# Patient Record
Sex: Female | Born: 1960 | Race: Black or African American | Hispanic: No | Smoking: Former smoker
Health system: Southern US, Community
[De-identification: ages and names within clinical notes are randomized; demographics above are authoritative.]

## PROBLEM LIST (undated history)

## (undated) DIAGNOSIS — J45909 Unspecified asthma, uncomplicated: Secondary | ICD-10-CM

## (undated) DIAGNOSIS — I10 Essential (primary) hypertension: Secondary | ICD-10-CM

## (undated) DIAGNOSIS — J449 Chronic obstructive pulmonary disease, unspecified: Secondary | ICD-10-CM

## (undated) DIAGNOSIS — R7303 Prediabetes: Secondary | ICD-10-CM

## (undated) DIAGNOSIS — F419 Anxiety disorder, unspecified: Secondary | ICD-10-CM

## (undated) DIAGNOSIS — E119 Type 2 diabetes mellitus without complications: Secondary | ICD-10-CM

## (undated) HISTORY — PX: CHOLECYSTECTOMY: SHX55

## (undated) HISTORY — PX: COLON SURGERY: SHX602

## (undated) HISTORY — PX: OTHER SURGICAL HISTORY: SHX169

---

## 2004-06-13 ENCOUNTER — Emergency Department: Payer: Self-pay | Admitting: Emergency Medicine

## 2004-08-24 ENCOUNTER — Emergency Department: Payer: Self-pay | Admitting: Emergency Medicine

## 2004-10-05 ENCOUNTER — Emergency Department: Payer: Self-pay | Admitting: Emergency Medicine

## 2004-11-02 ENCOUNTER — Emergency Department: Payer: Self-pay | Admitting: Internal Medicine

## 2005-07-03 ENCOUNTER — Other Ambulatory Visit: Payer: Self-pay

## 2005-07-03 ENCOUNTER — Emergency Department: Payer: Self-pay | Admitting: Emergency Medicine

## 2005-07-03 IMAGING — CR DG CHEST 2V
1 series · 2 of 2 positions shown · non-contrast
Comparison: none

REASON FOR EXAM: Chest pain.
COMMENTS:

PROCEDURE:     DXR - DXR CHEST PA (OR AP) AND LATERAL  - [DATE] [DATE]
RESULT:        The lungs are clear.  Cardiovascular structures are
unremarkable.

[Series 1906: postero_anterior · 0.11mm/px · 2 of 2 slices shown]
[im 1/2]
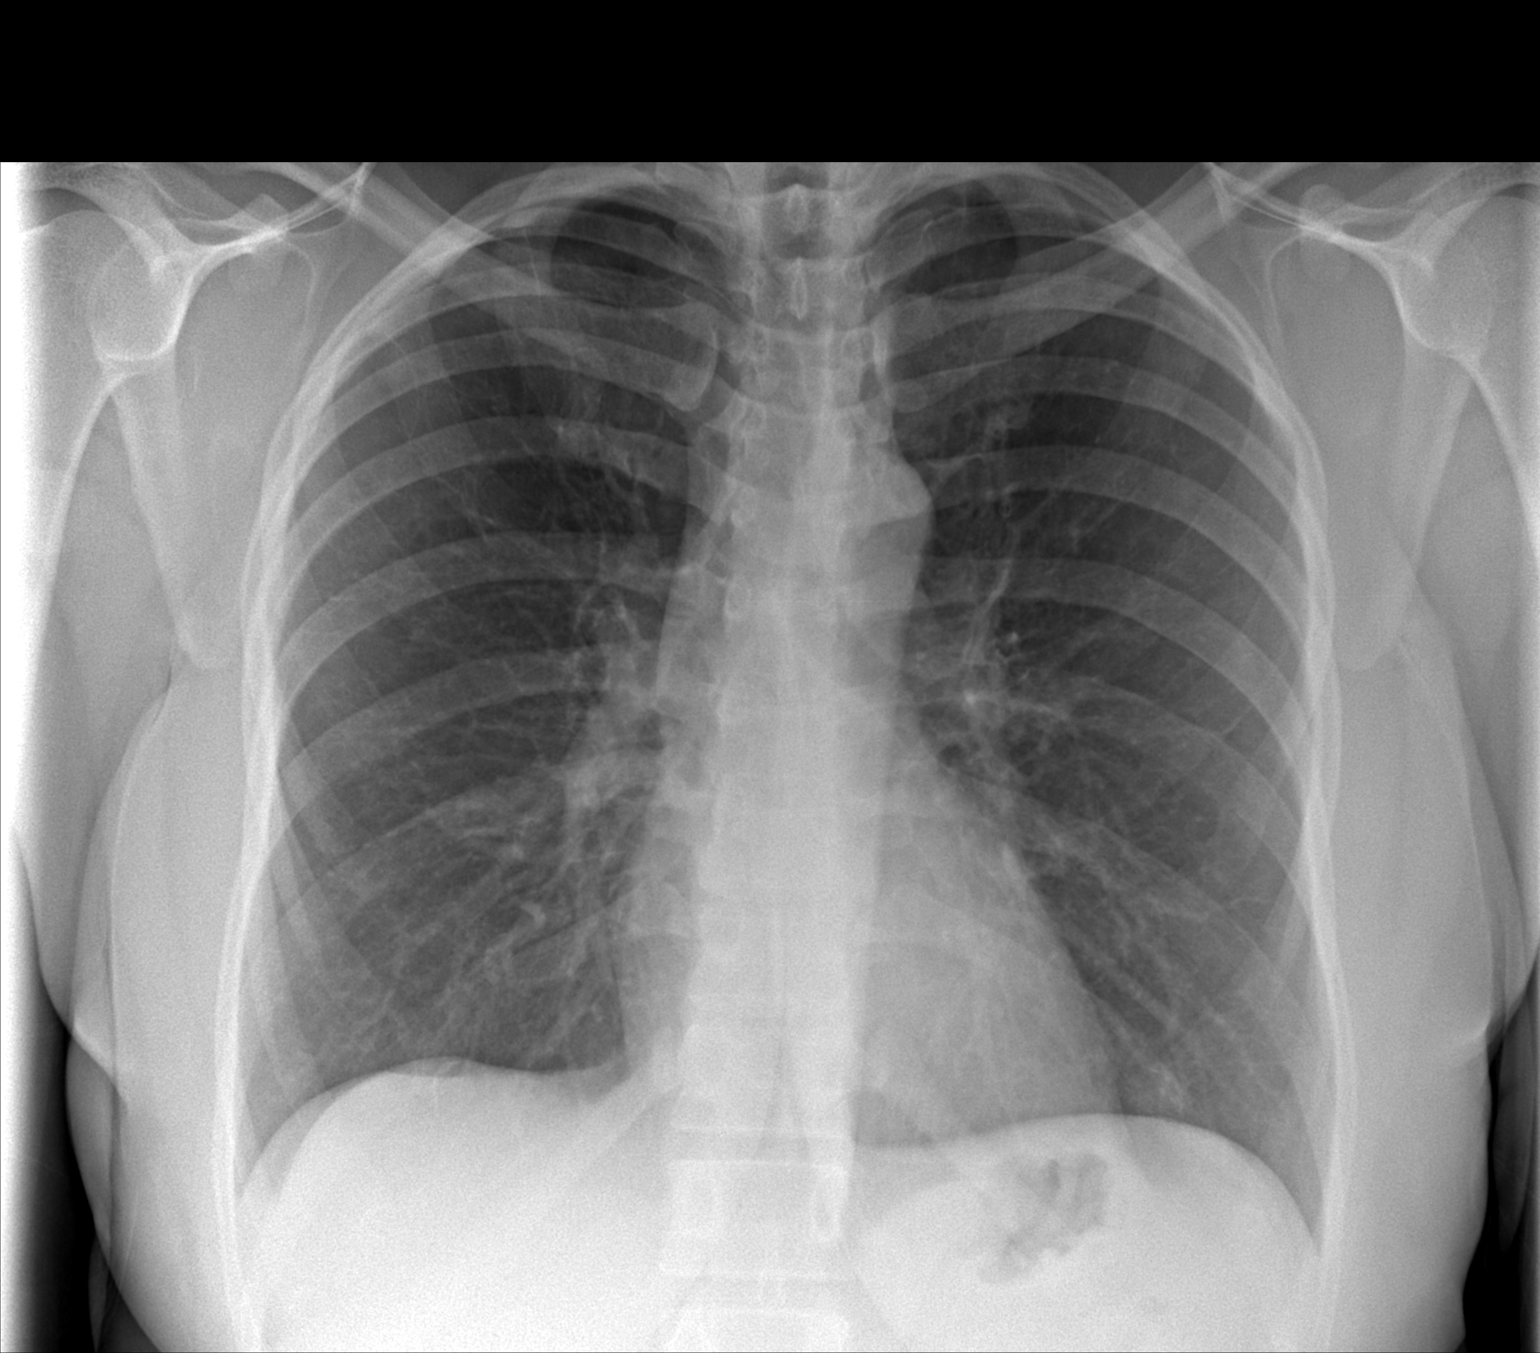
[im 2/2]
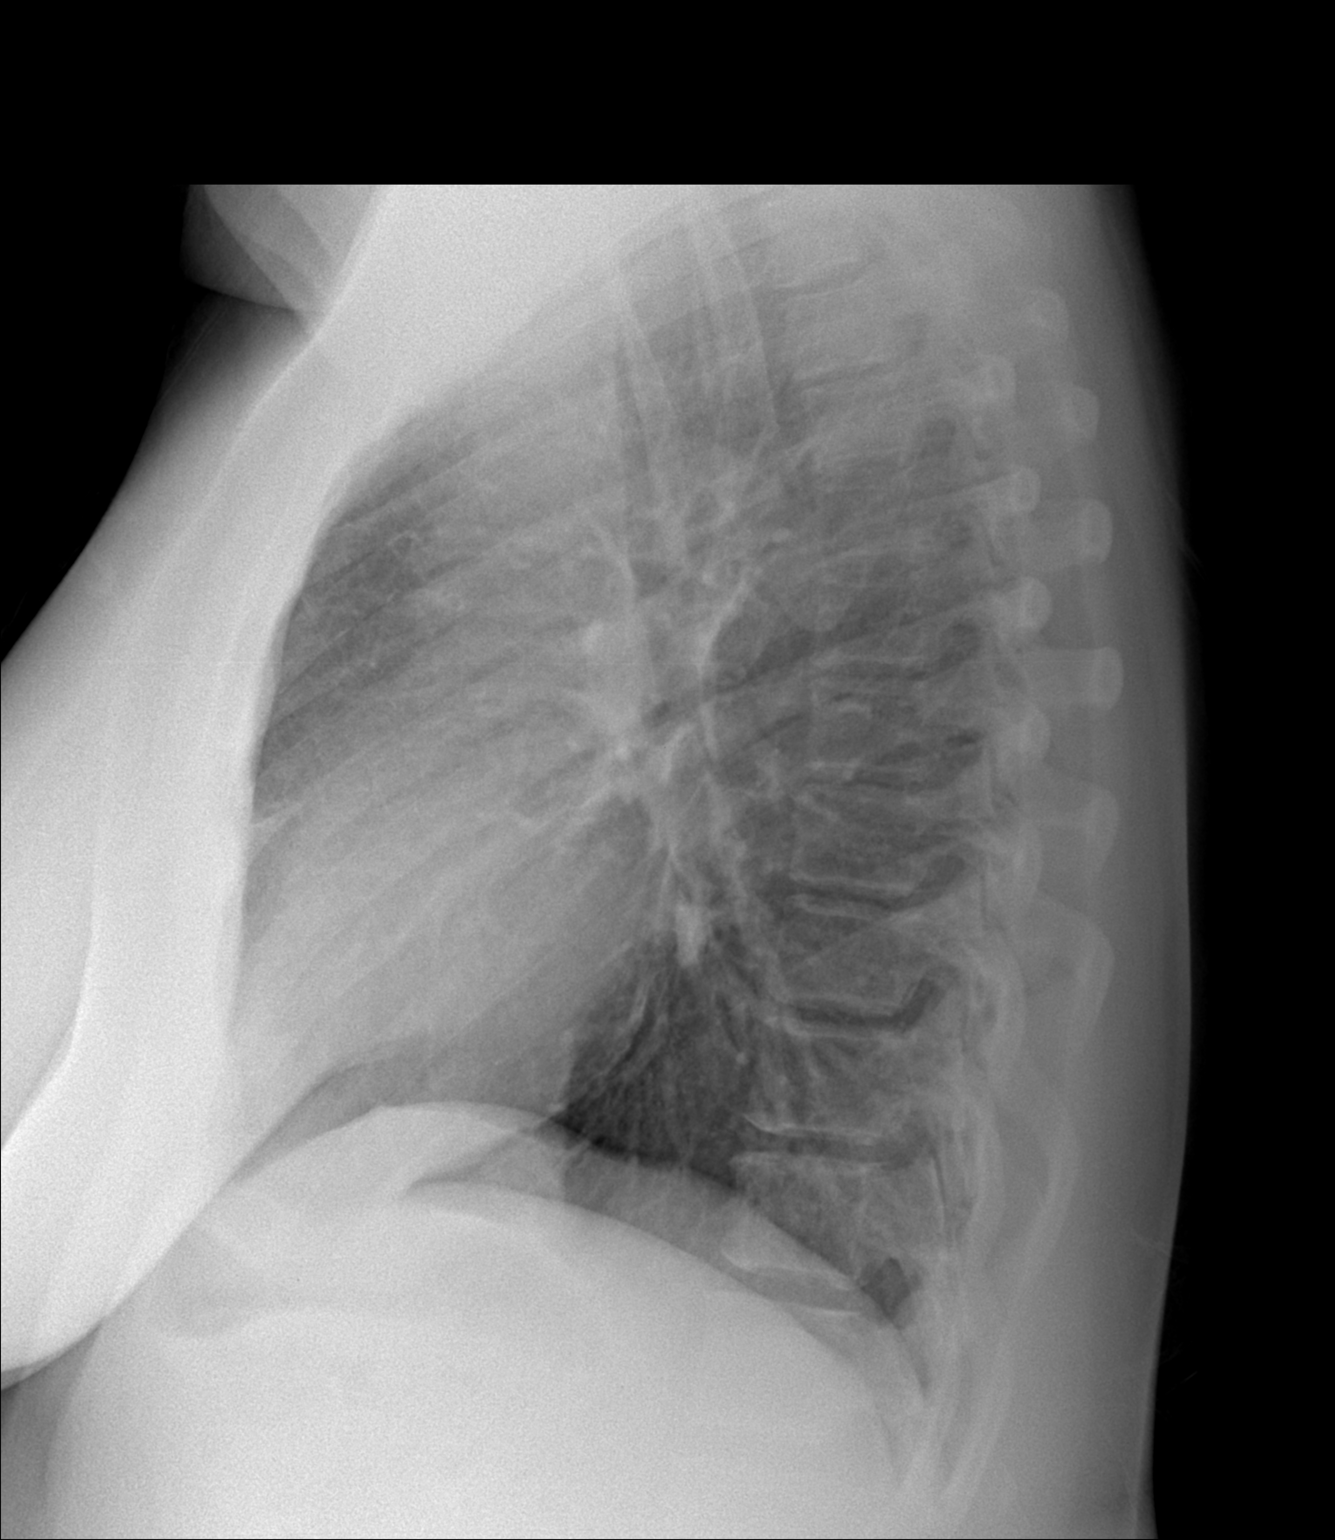

[2 of 2 positions shown; findings below may reference images not displayed]

IMPRESSION: No acute cardiopulmonary disease.

## 2005-07-31 ENCOUNTER — Emergency Department: Payer: Self-pay | Admitting: Emergency Medicine

## 2005-07-31 IMAGING — CR RIGHT ANKLE - COMPLETE 3+ VIEW
1 series · 5 of 5 positions shown · non-contrast
Comparison: none

REASON FOR EXAM: pain
COMMENTS:  LMP: One week ago

PROCEDURE:     DXR - DXR ANKLE RIGHT COMPLETE  - [DATE] [DATE]
RESULT:     No acute bony or joint abnormalities identified. There is no
evidence of fracture or dislocation.

[Series 1: view not recorded · 0.17mm/px · 5 of 5 slices shown]
[im 1/5]
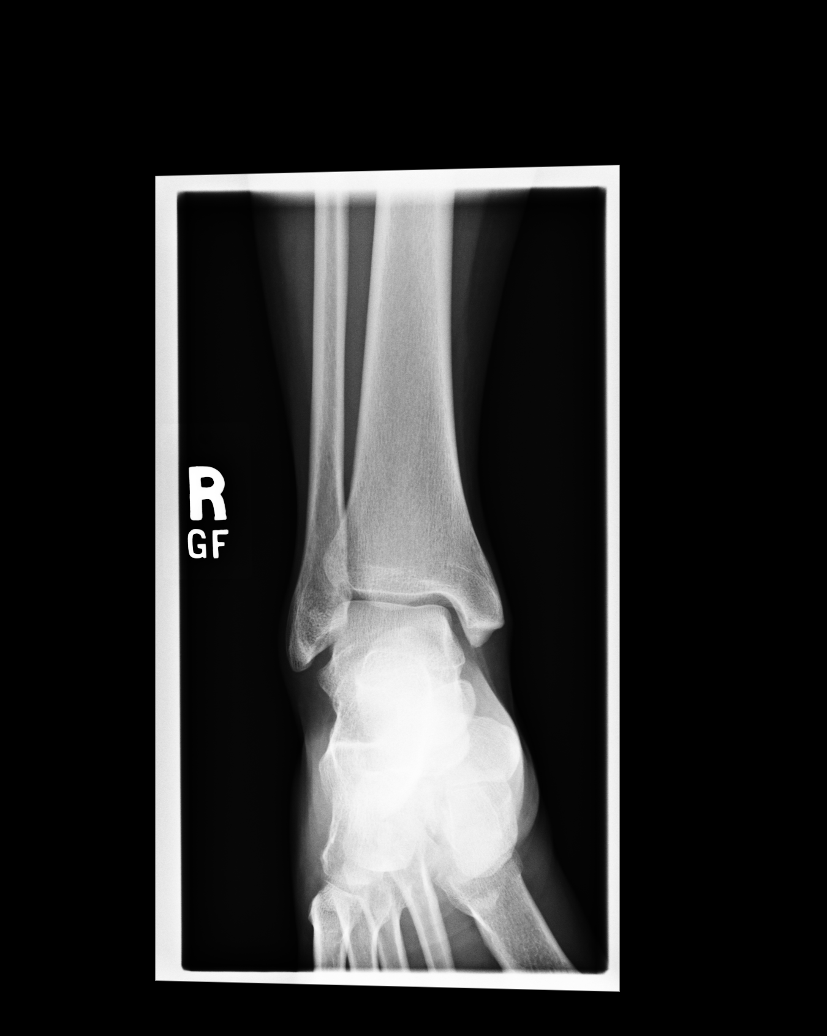
[im 2/5]
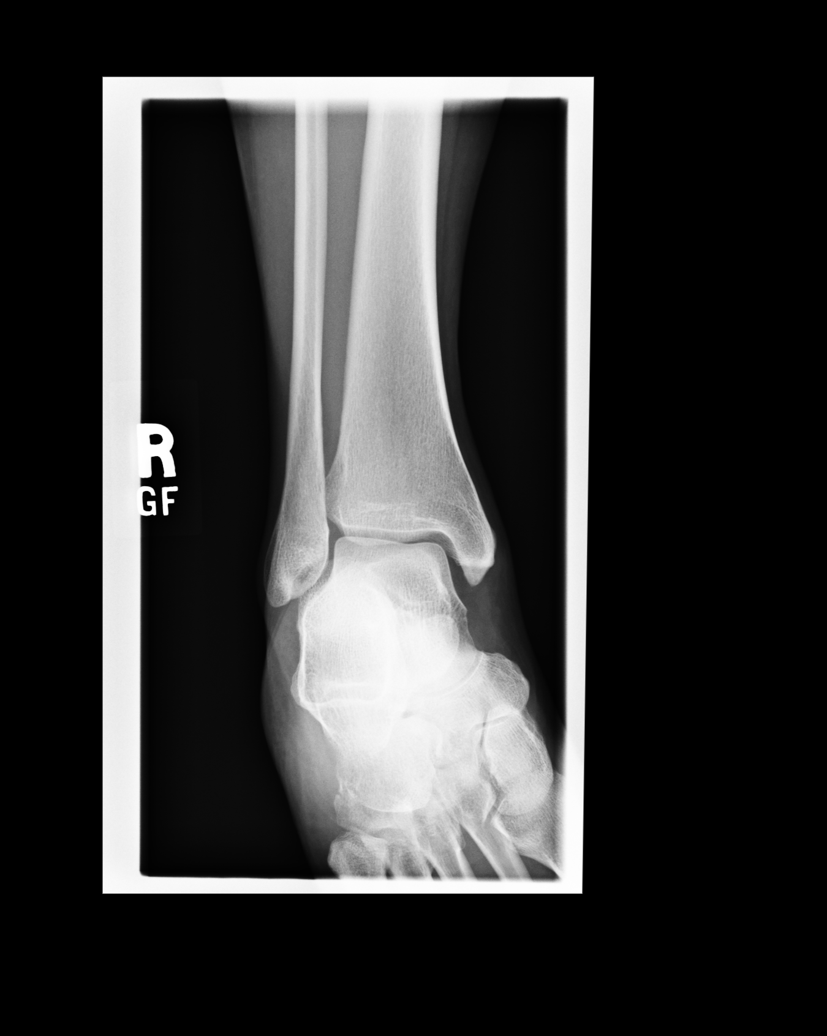
[im 3/5]
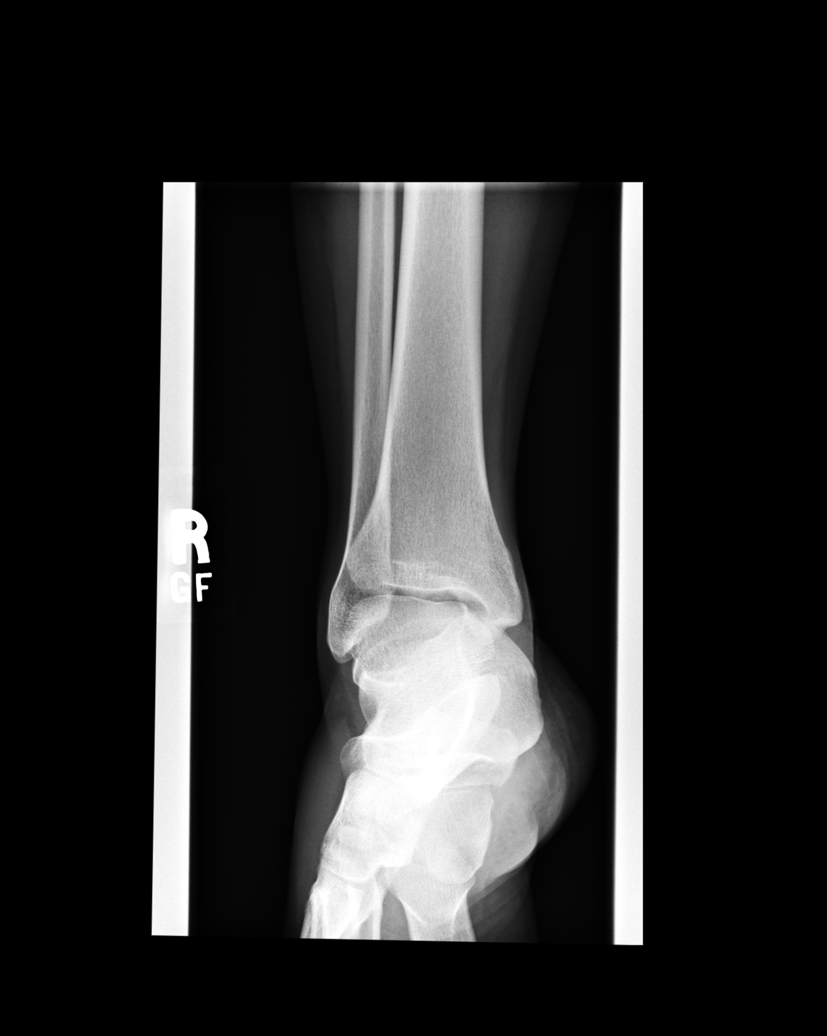
[im 4/5]
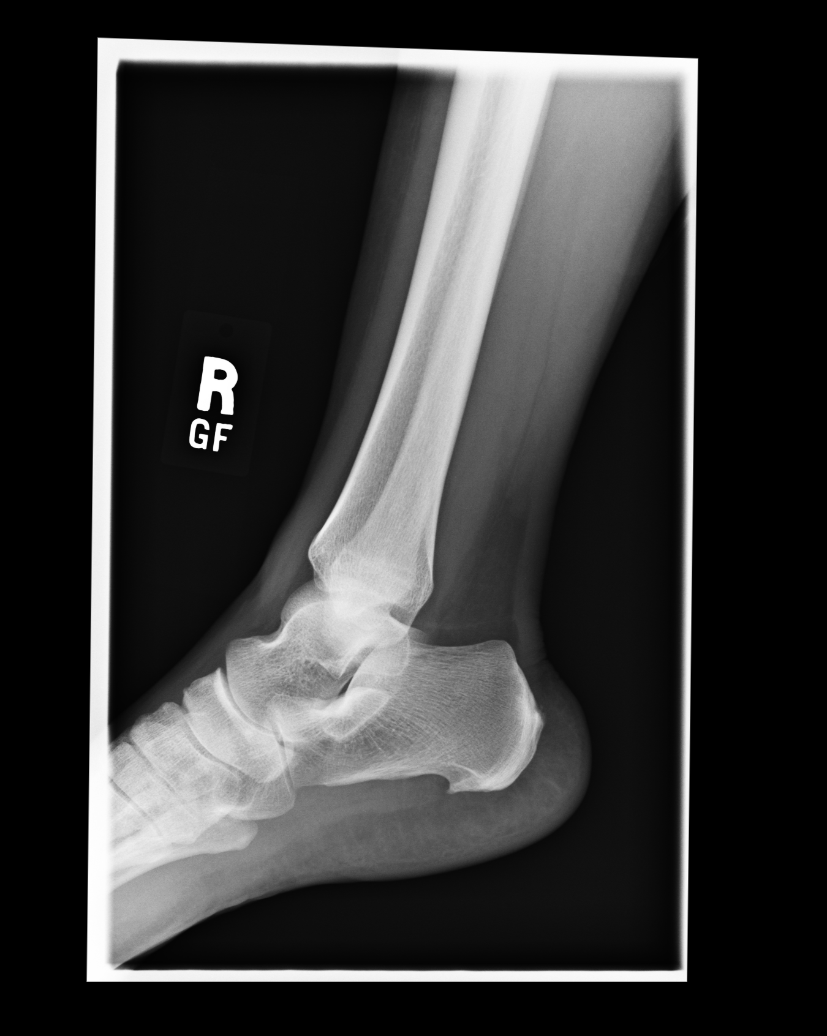
[im 5/5]
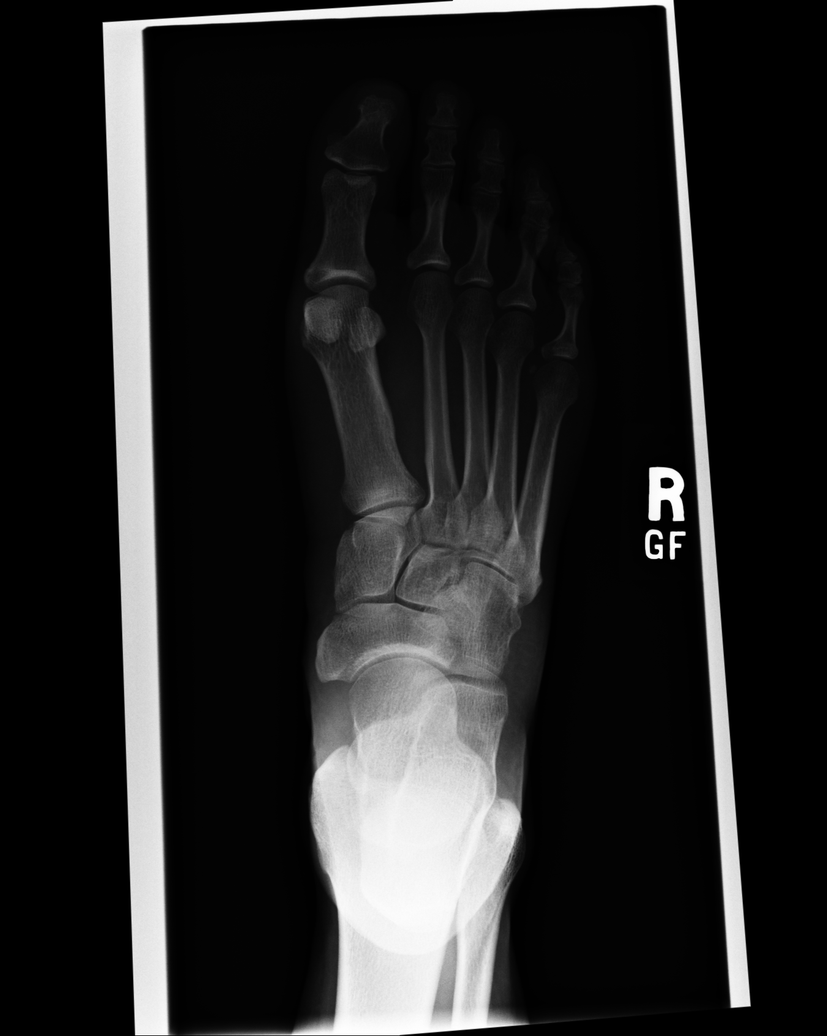

[5 of 5 positions shown; findings below may reference images not displayed]

IMPRESSION: 1)Please see above.

## 2006-02-06 ENCOUNTER — Ambulatory Visit: Payer: Self-pay

## 2006-06-13 ENCOUNTER — Ambulatory Visit: Payer: Self-pay | Admitting: Specialist

## 2006-11-16 ENCOUNTER — Emergency Department: Payer: Self-pay

## 2006-12-26 ENCOUNTER — Emergency Department: Payer: Self-pay | Admitting: Emergency Medicine

## 2009-11-02 ENCOUNTER — Ambulatory Visit: Payer: Self-pay

## 2009-11-24 ENCOUNTER — Ambulatory Visit: Payer: Self-pay | Admitting: Surgery

## 2009-12-01 ENCOUNTER — Ambulatory Visit: Payer: Self-pay | Admitting: Surgery

## 2010-08-26 ENCOUNTER — Emergency Department: Payer: Self-pay | Admitting: Emergency Medicine

## 2010-08-26 IMAGING — CR LEFT THUMB 2+V
1 series · 3 of 3 positions shown · non-contrast
Comparison: none

REASON FOR EXAM: numbness
COMMENTS:

PROCEDURE:     DXR - DXR THUMB LEFT HAND (1ST DIGIT)  - [DATE] [DATE]
RESULT:     No fracture, dislocation or other acute bony abnormality is
identified. No radiodense soft tissue foreign body is seen.

[Series 1: view not recorded · 0.17mm/px · 3 of 3 slices shown]
[im 1/3]
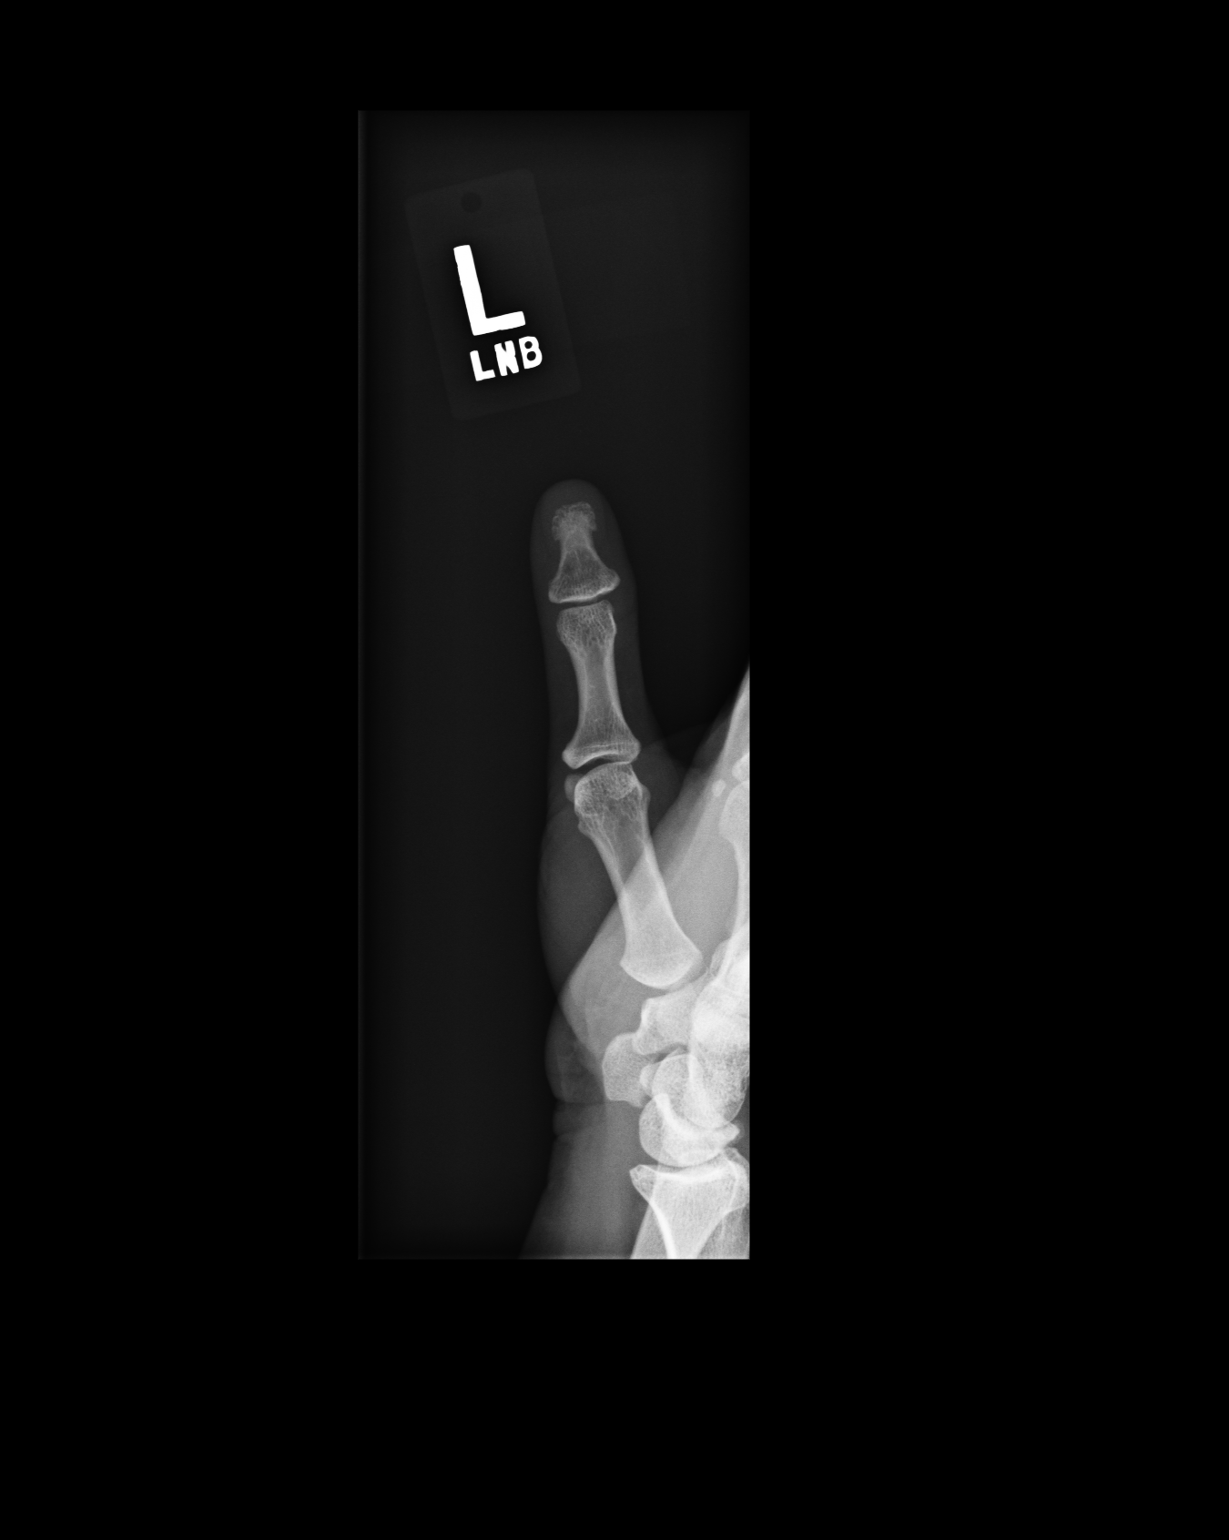
[im 2/3]
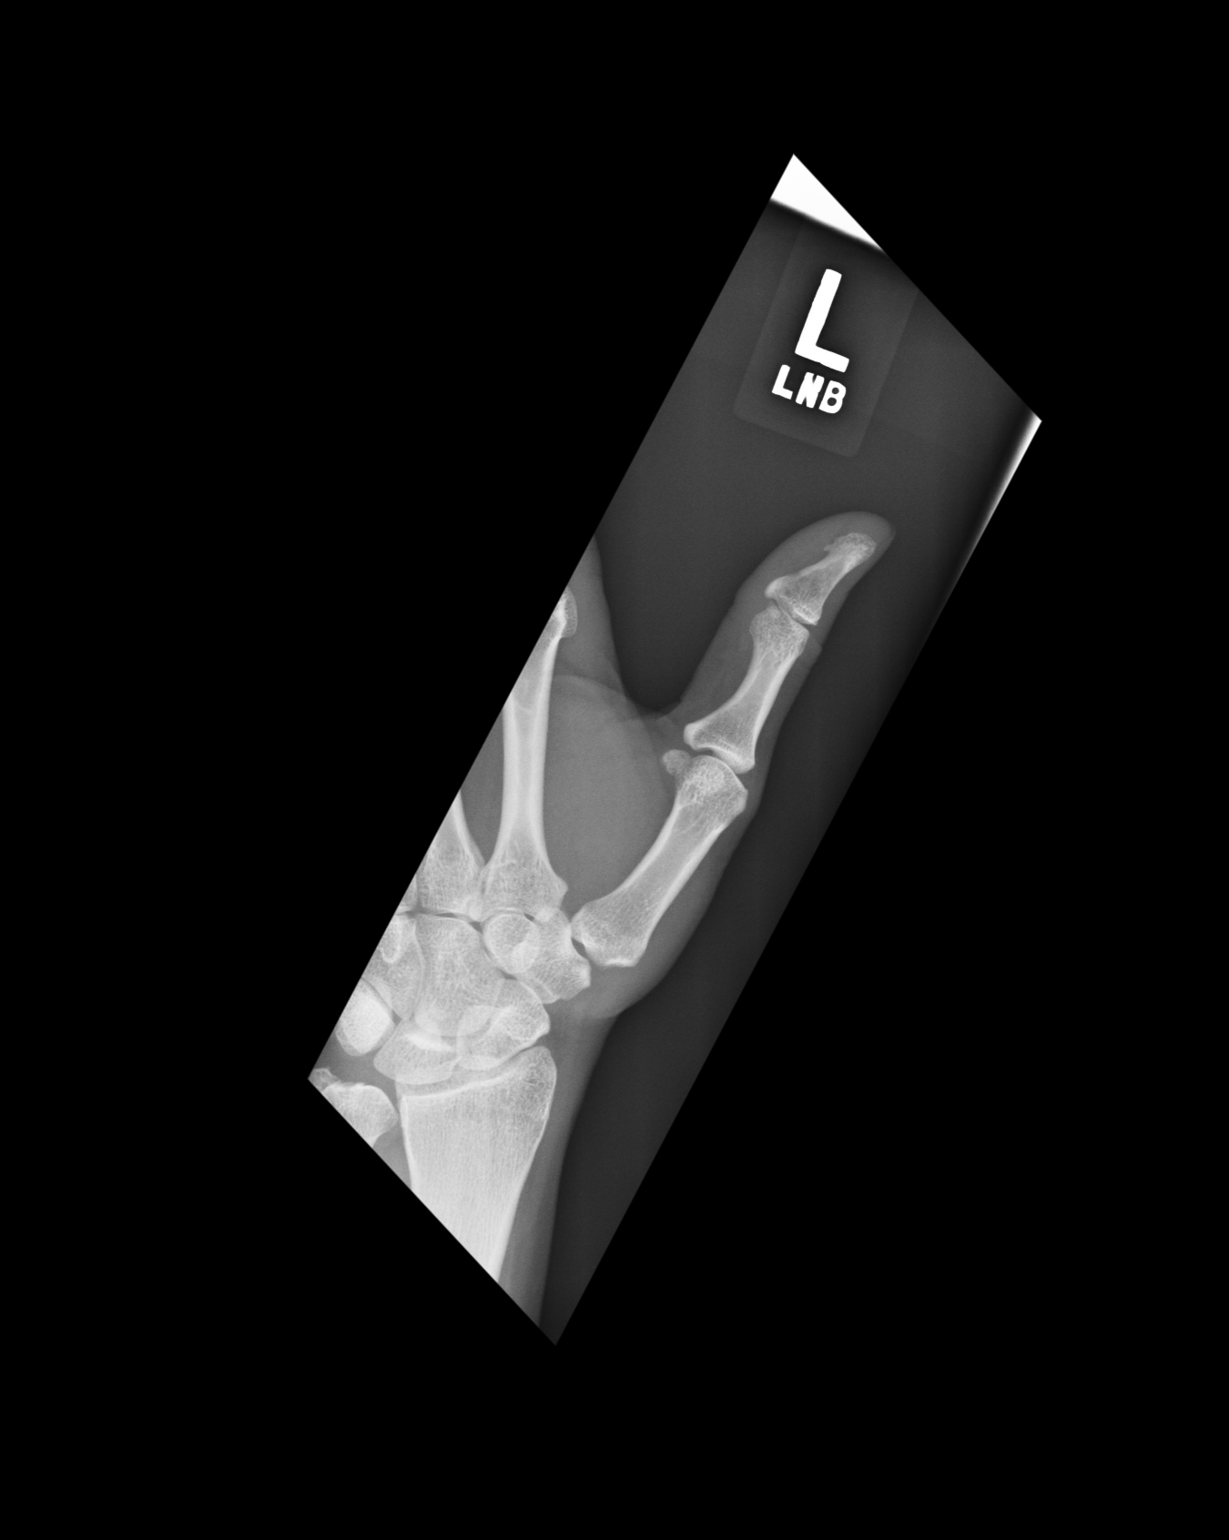
[im 3/3]
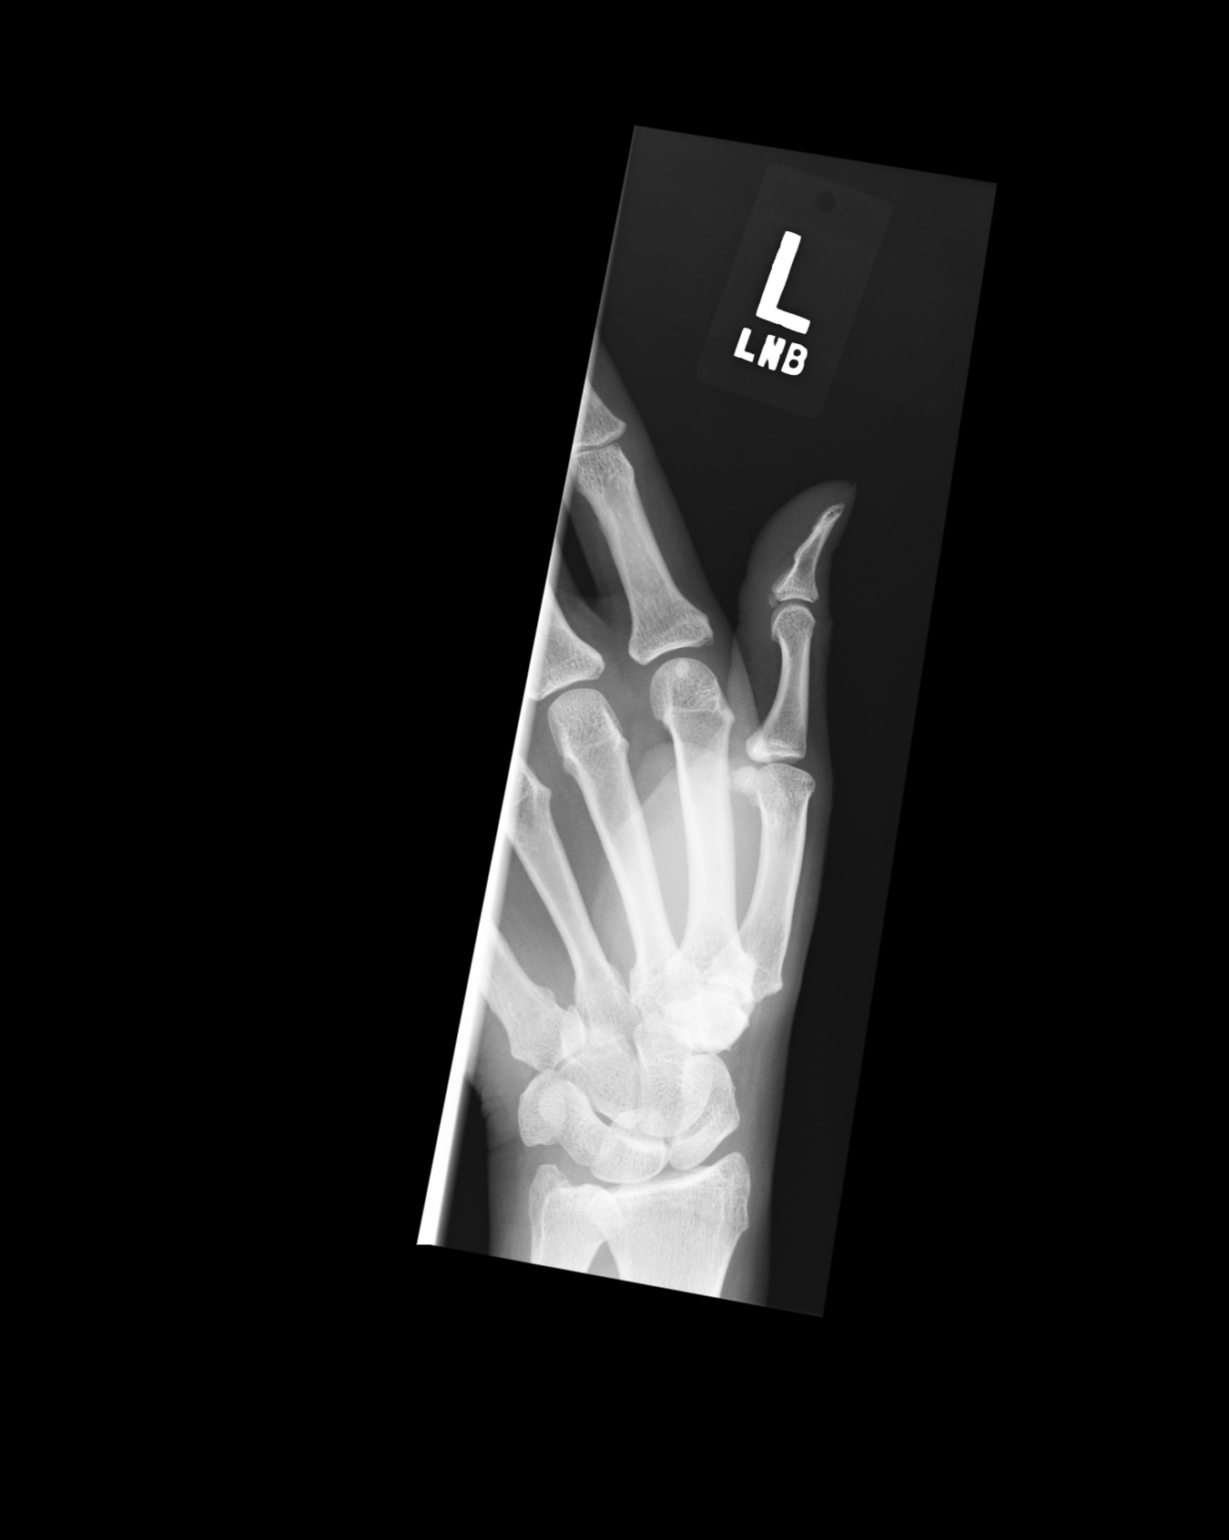

[3 of 3 positions shown; findings below may reference images not displayed]

IMPRESSION: No acute changes are identified.

## 2010-09-08 ENCOUNTER — Emergency Department: Payer: Self-pay | Admitting: Emergency Medicine

## 2010-09-08 IMAGING — CR DG KNEE COMPLETE 4+V*R*
1 series · 4 of 4 positions shown · non-contrast
Comparison: none

REASON FOR EXAM: knee pain
COMMENTS:

PROCEDURE:     DXR - DXR KNEE RT COMP WITH OBLIQUES  - [DATE] [DATE]
RESULT:     No fracture, dislocation or other acute bony abnormality is
identified. The knee joint space is well maintained. The patella is intact.
No lytic or blastic lesions are seen.

[Series 1: view not recorded · 0.17mm/px · 4 of 4 slices shown]
[im 1/4]
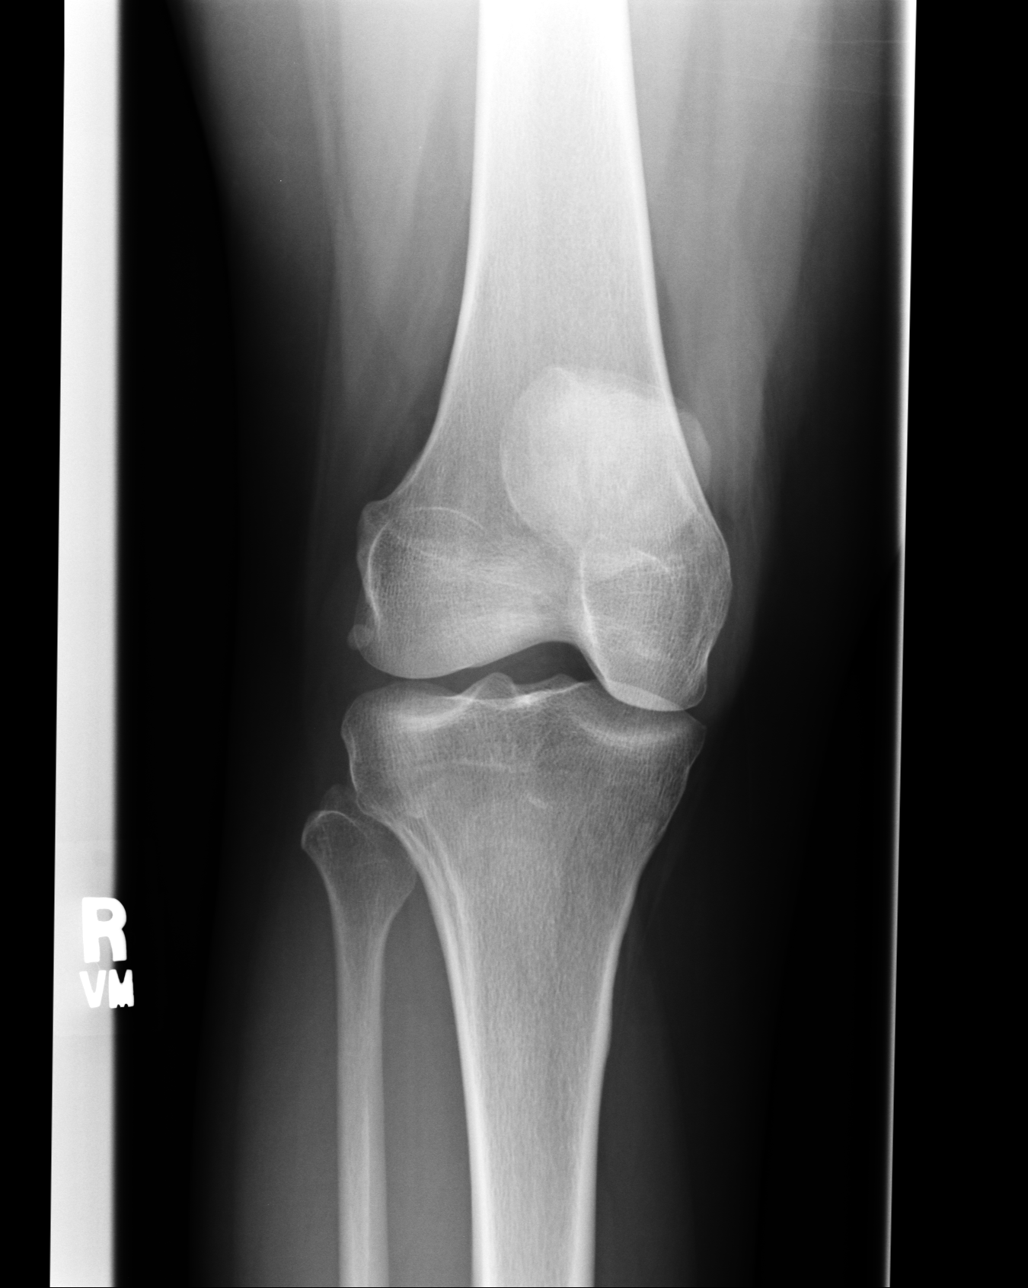
[im 2/4]
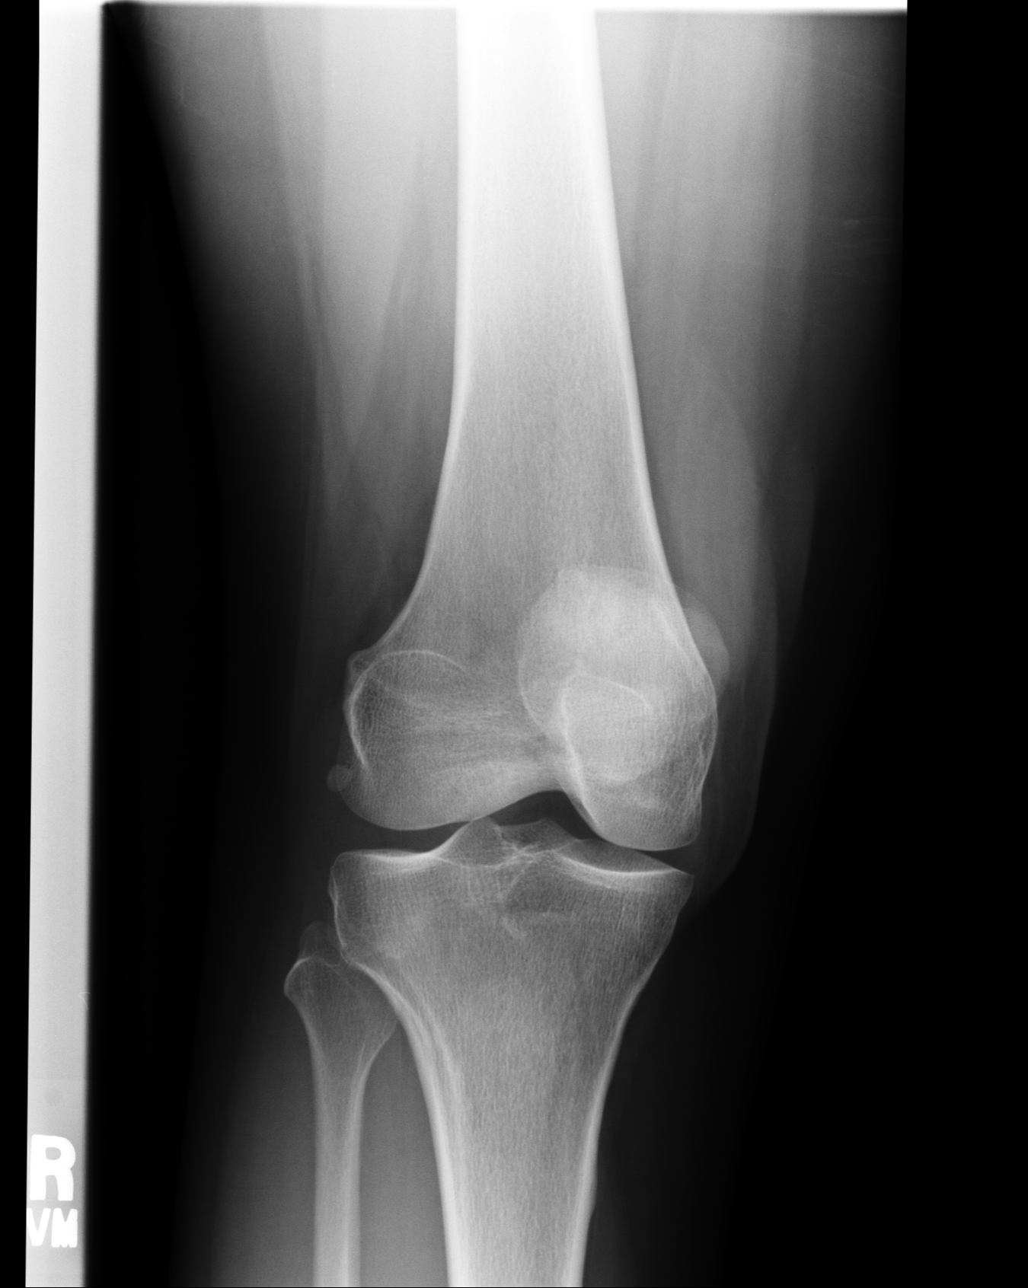
[im 3/4]
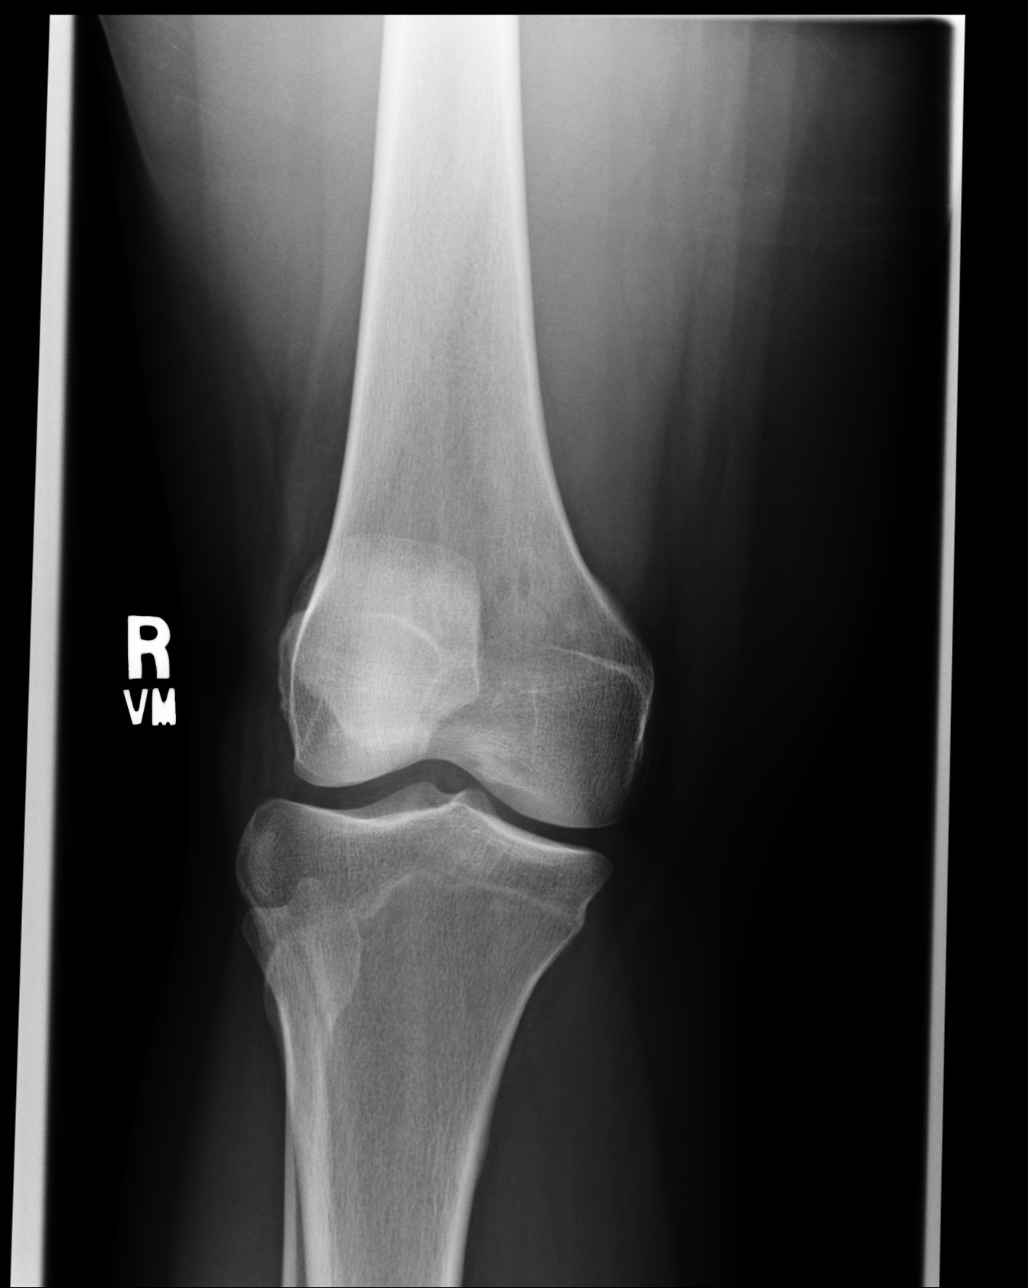
[im 4/4]
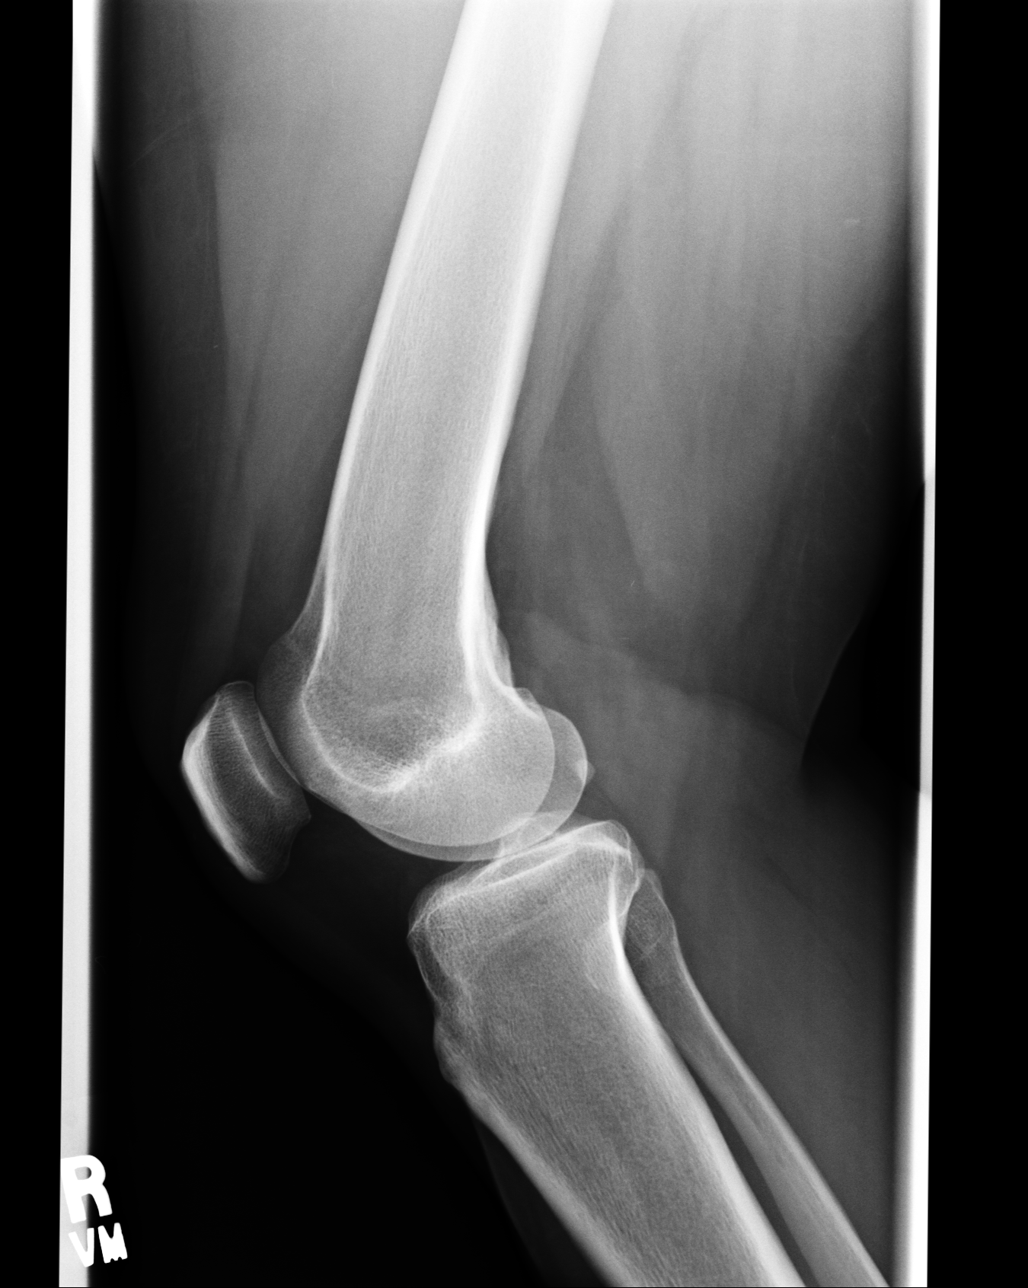

[4 of 4 positions shown; findings below may reference images not displayed]

IMPRESSION: No significant abnormalities are noted.

## 2010-09-08 IMAGING — US US EXTREM LOW VENOUS*R*
1 series · 17 of 24 positions shown · non-contrast
Comparison: none

REASON FOR EXAM: pain posterior knee, family hx dvt
COMMENTS:

[Series 1: us extrem low venous*right* · 17 of 25 slices shown]
[im 1/25]
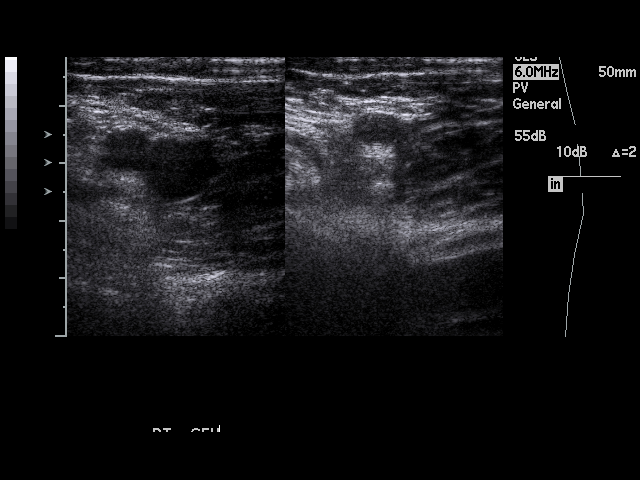
[im 3/25]
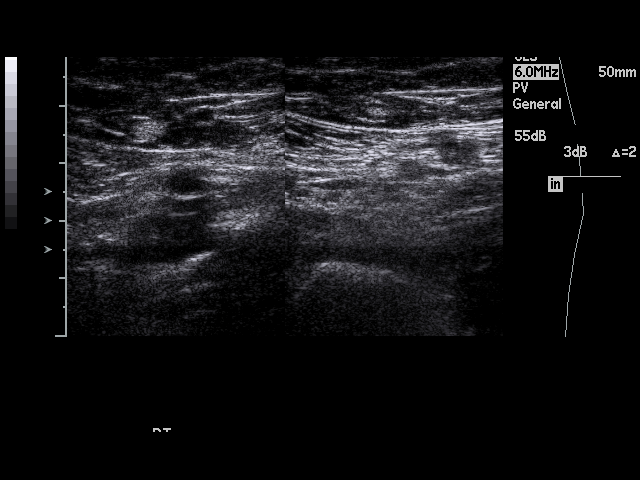
[im 4/25]
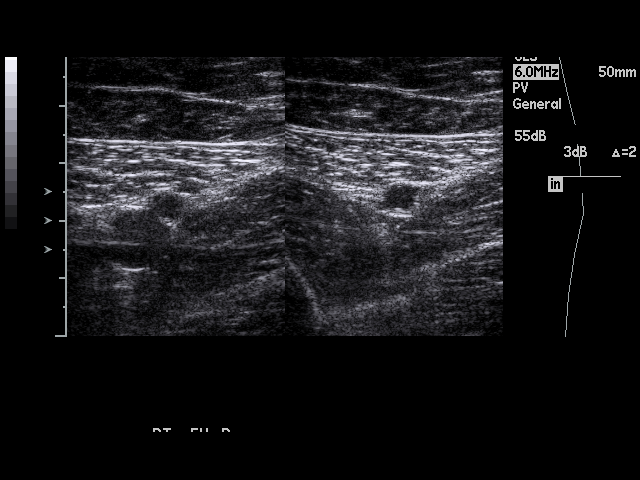
[im 5/25]
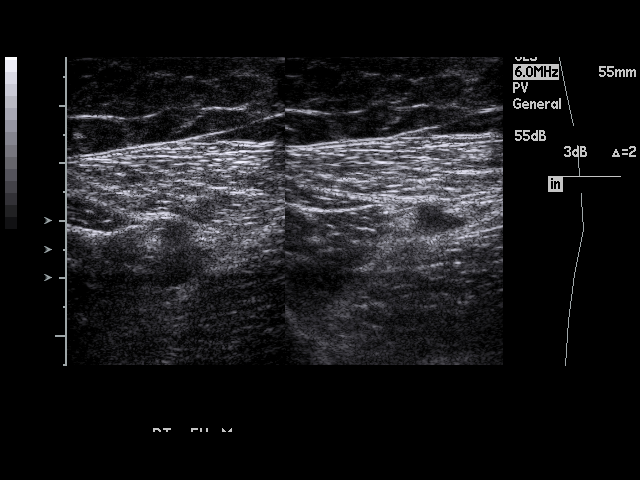
[im 7/25]
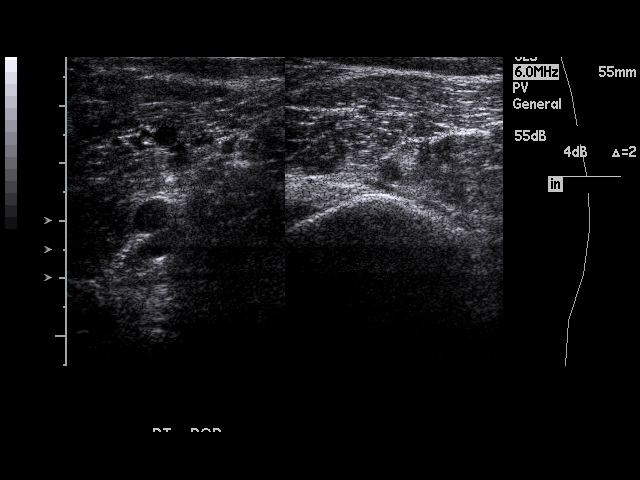
[im 8/25]
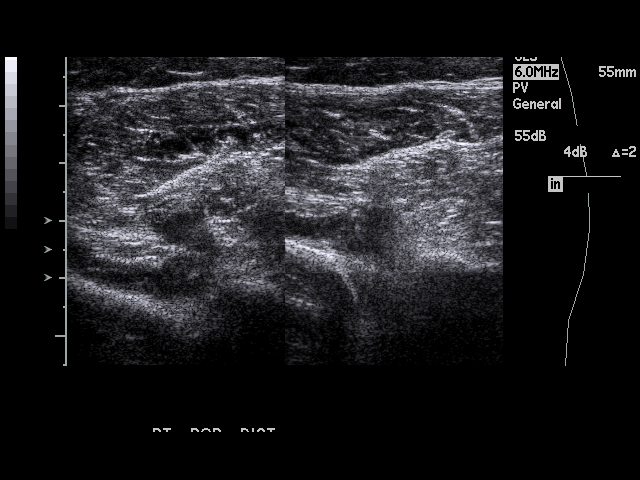
[im 10/25]
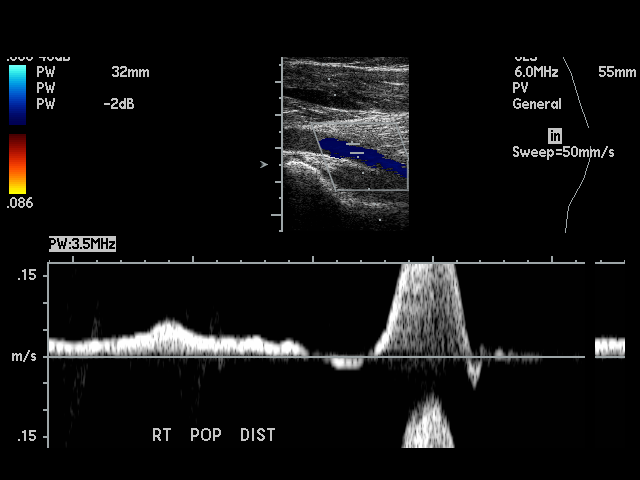
[im 11/25]
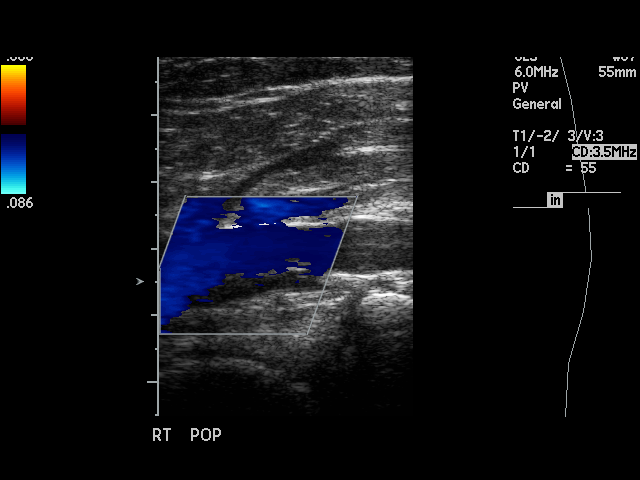
[im 13/25]
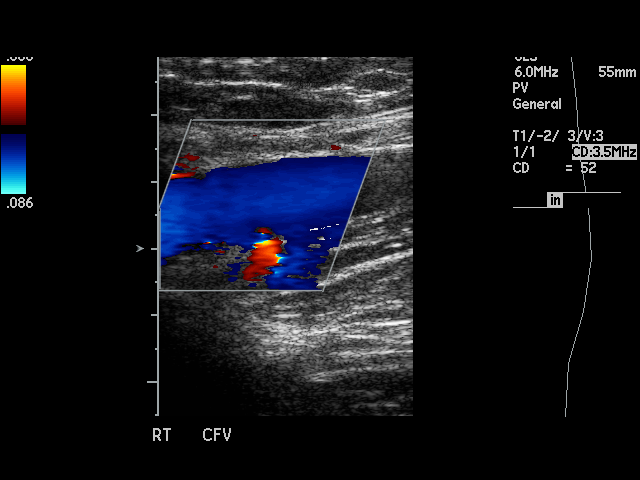
[im 14/25]
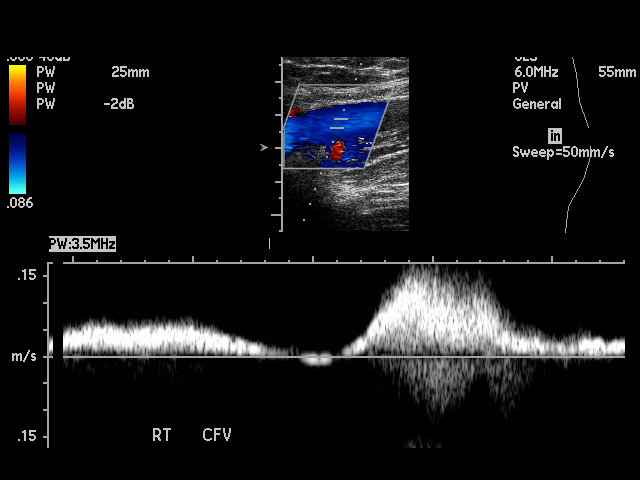
[im 15/25]
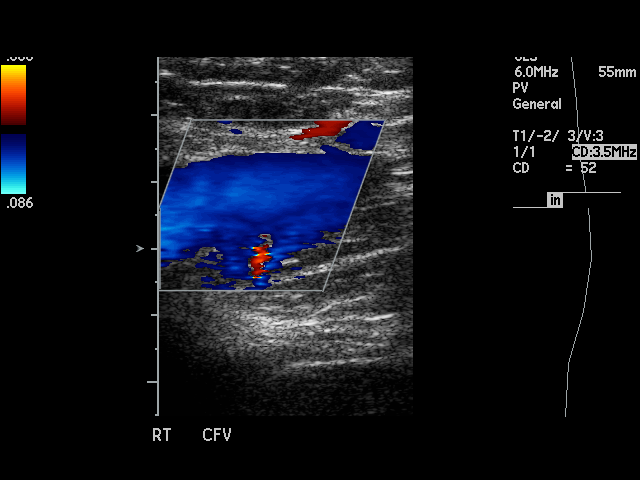
[im 17/25]
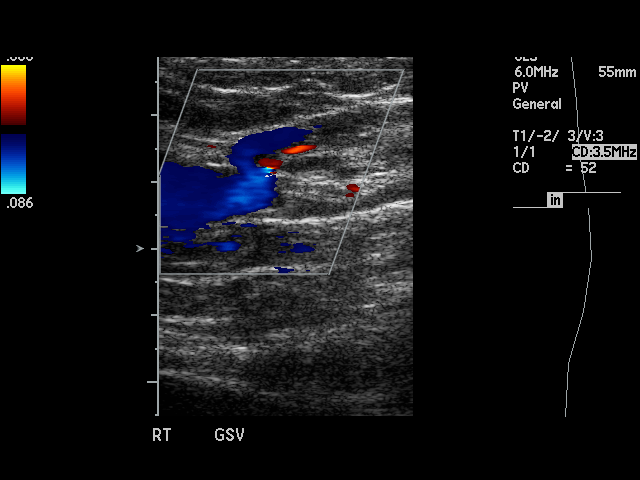
[im 18/25]
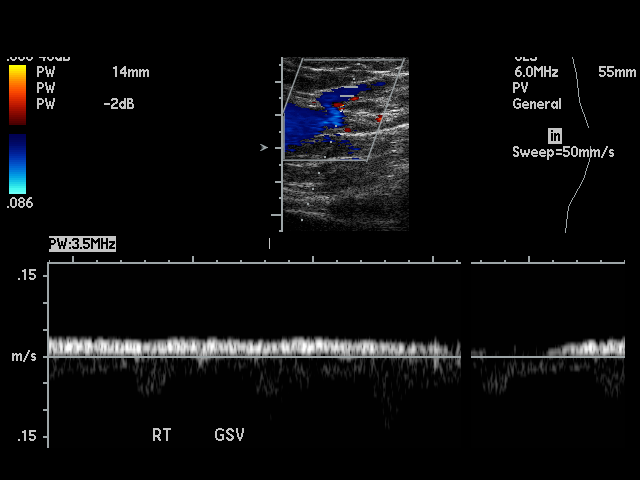
[im 20/25]
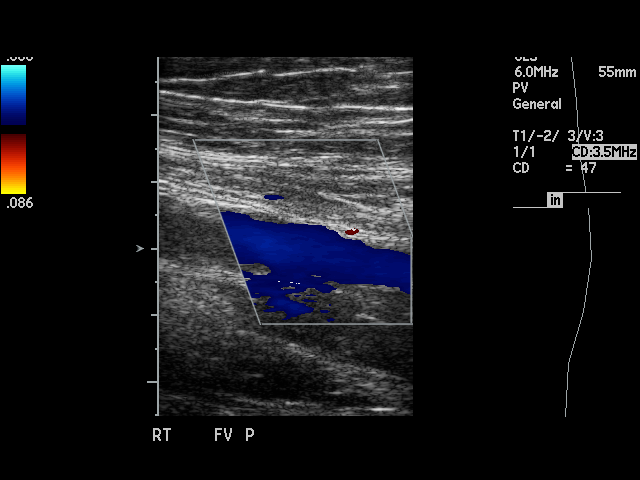
[im 21/25]
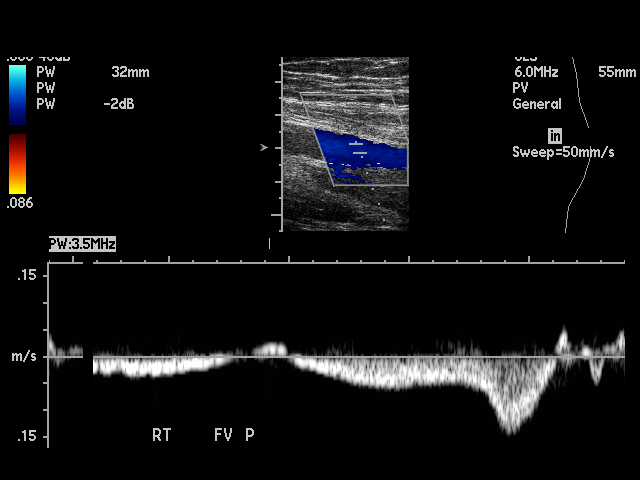
[im 22/25]
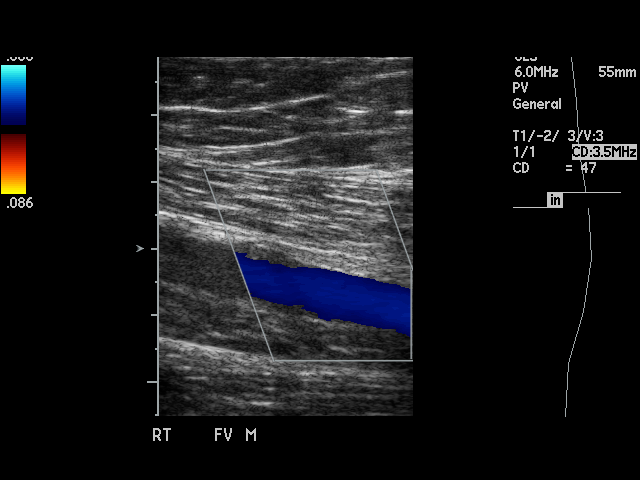
[im 25/25]
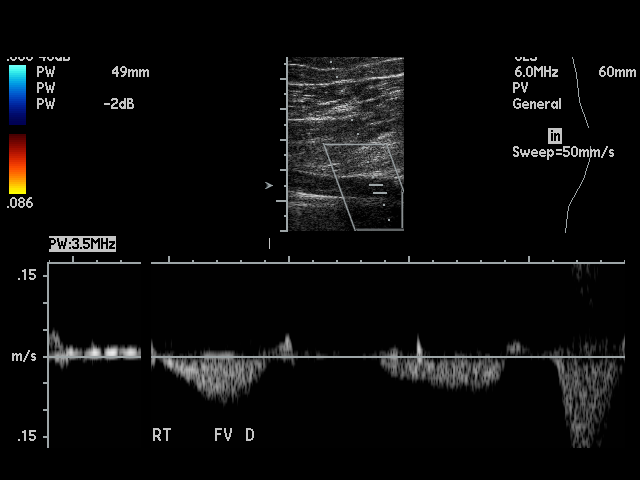

[17 of 24 positions shown; findings below may reference images not displayed]

PROCEDURE:     US  - US DOPPLER LOW EXTR RIGHT  - [DATE]  [DATE]

RESULT:     The phasic, augmentation and Valsalva flow waveforms are normal
in appearance. The right femoral and popliteal vein shows complete
compressibility throughout its course. Doppler examination shows no
occlusion or evidence for deep vein thrombosis.
IMPRESSION: No deep vein thrombosis is identified in the right leg.

## 2012-04-21 ENCOUNTER — Emergency Department: Payer: Self-pay | Admitting: Emergency Medicine

## 2012-04-21 IMAGING — CR DG KNEE COMPLETE 4+V*L*
1 series · 4 of 4 positions shown · non-contrast
Comparison: none

REASON FOR EXAM: pain, injury
COMMENTS:

PROCEDURE:     DXR - DXR KNEE LT COMP WITH OBLIQUES  - [DATE]  [DATE]
RESULT:     There is no evidence of fracture, dislocation, or malalignment.
Likely a benign bone island within the patella.

[Series 1: ap · 0.17mm/px · 4 of 4 slices shown]
[im 1/4]
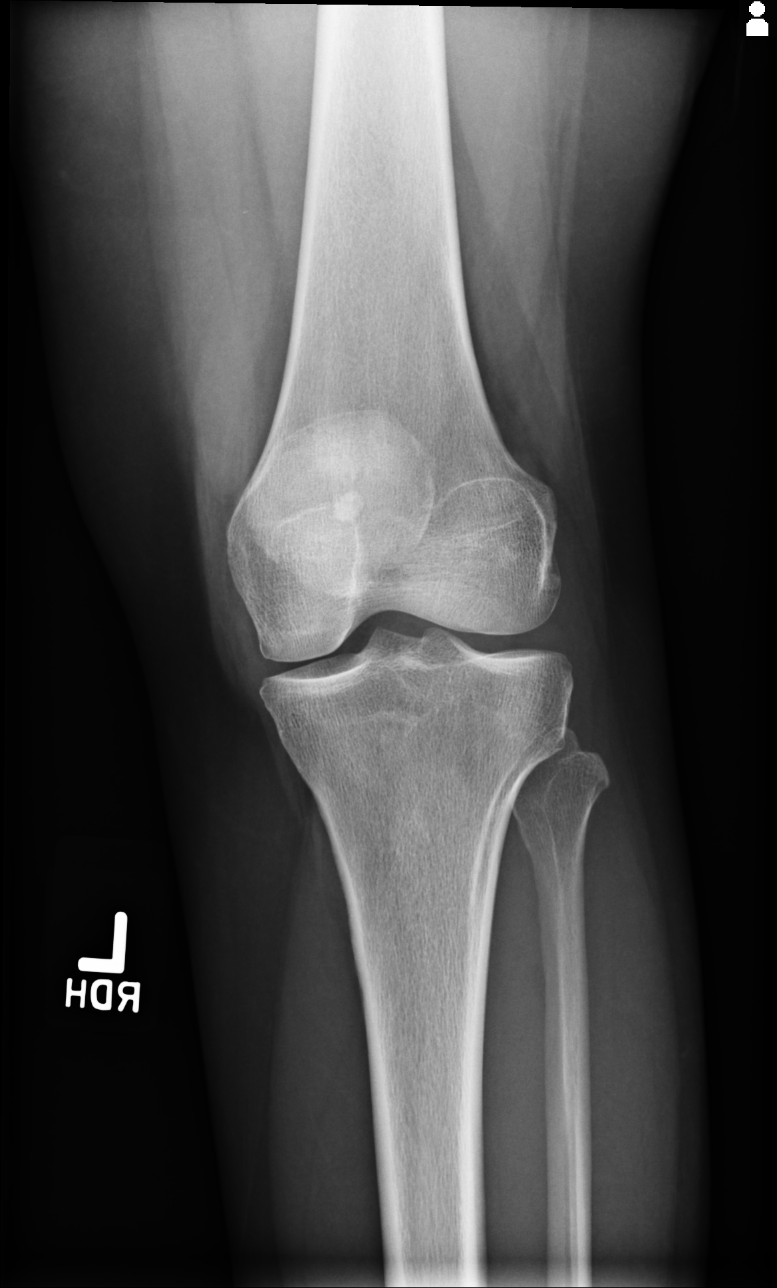
[im 2/4]
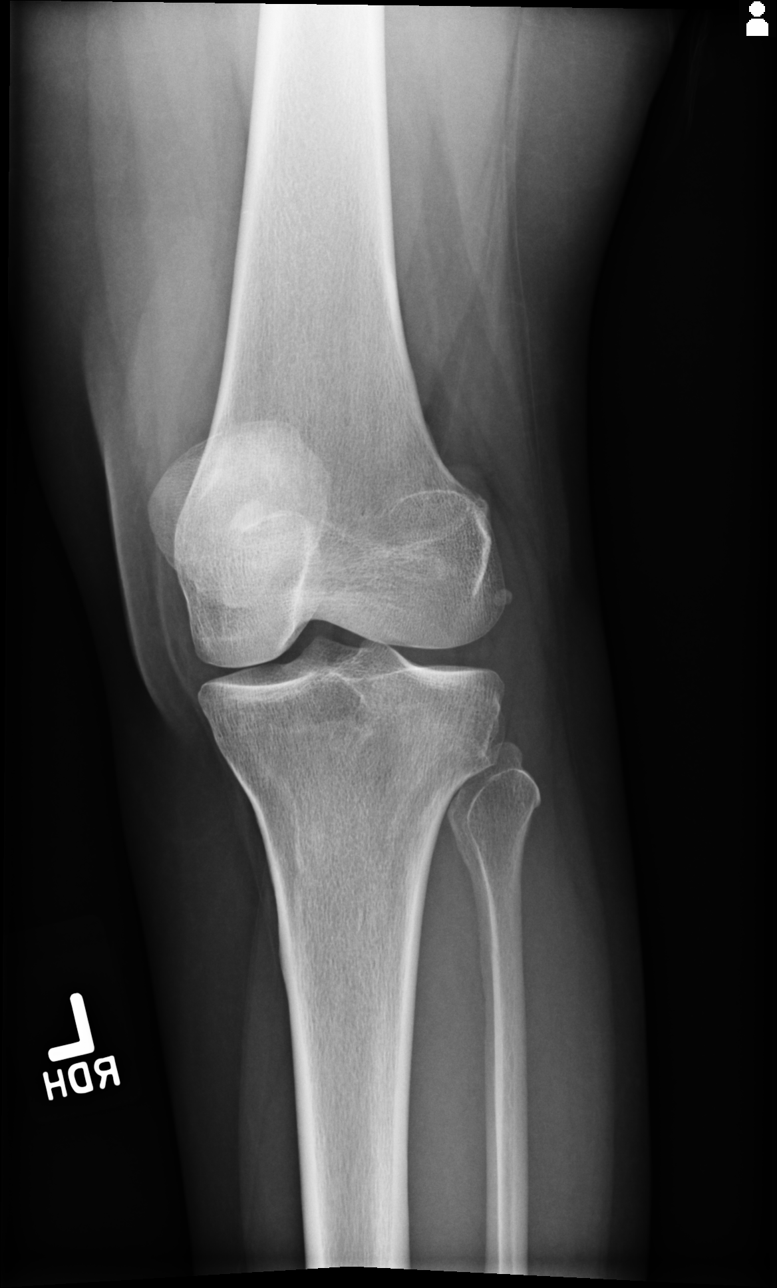
[im 3/4]
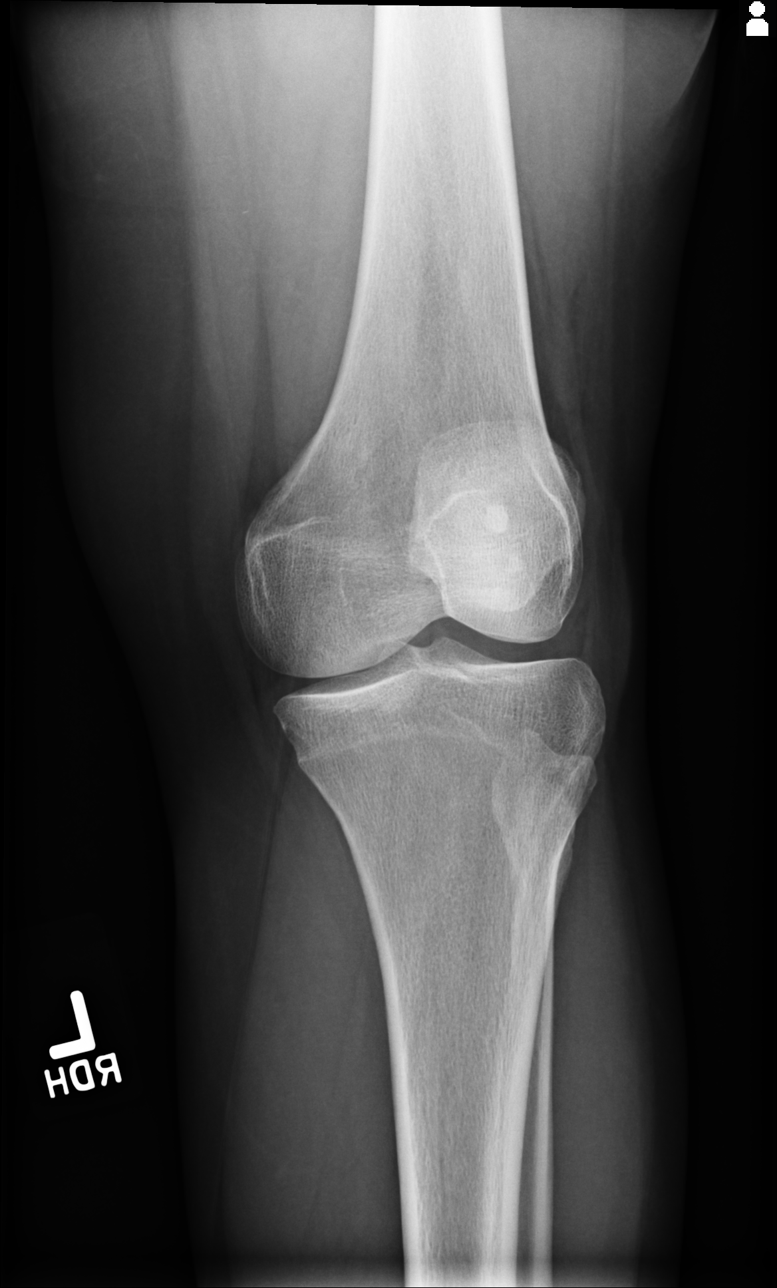
[im 4/4]
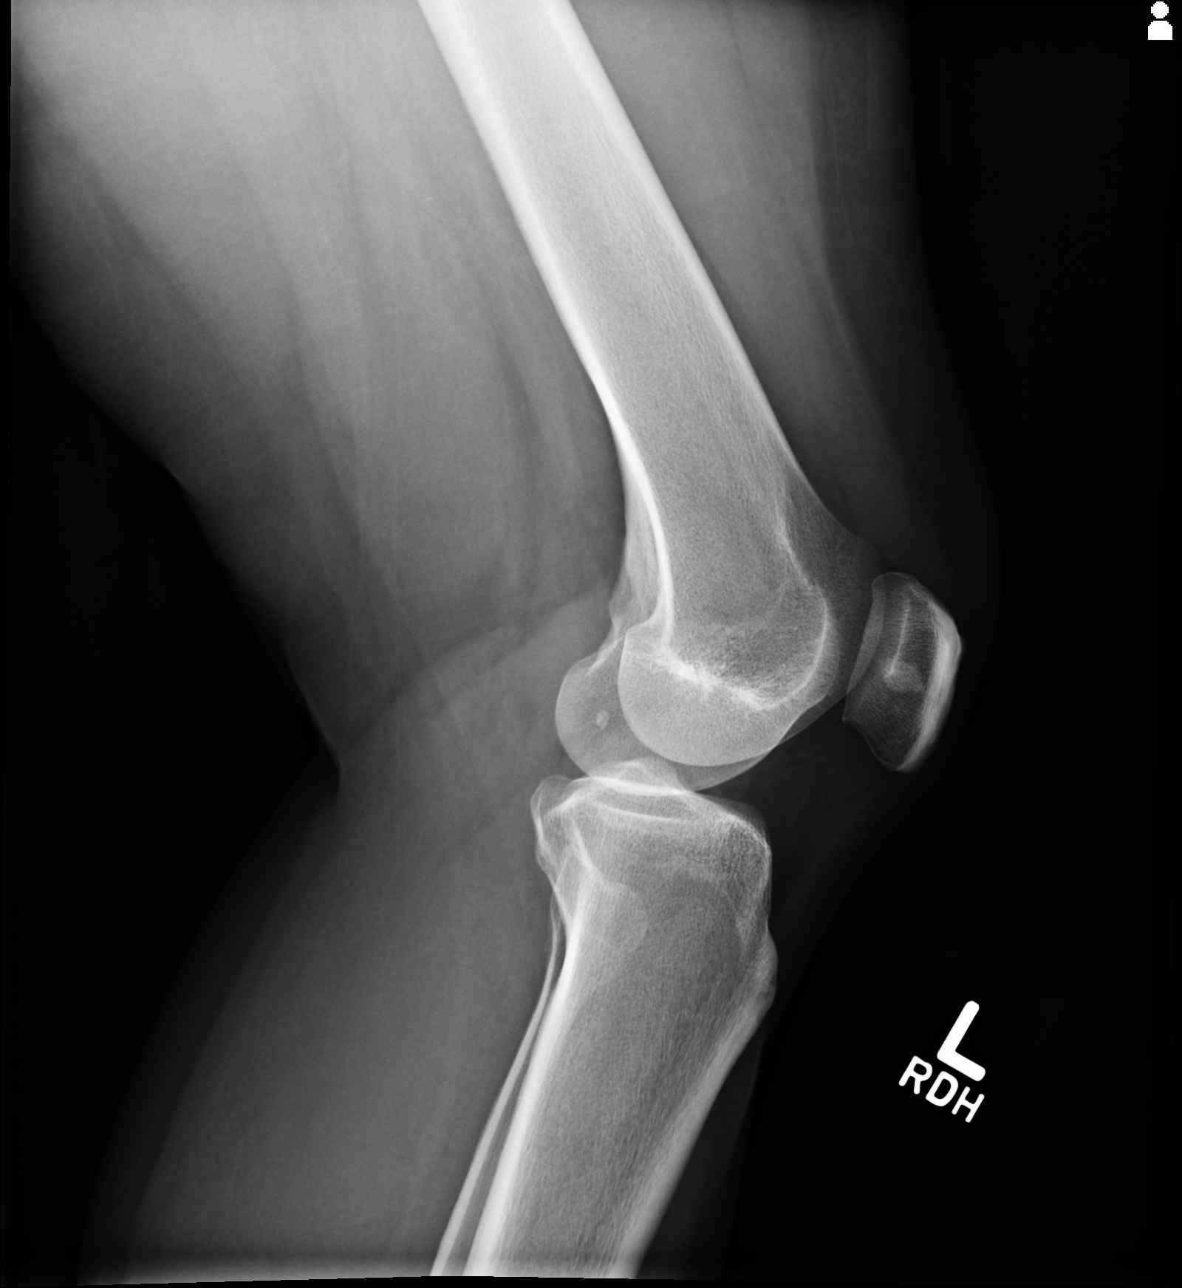

[4 of 4 positions shown; findings below may reference images not displayed]

IMPRESSION: 1. No evidence of acute abnormalities.
2. If there are persistent complaints of pain or persistent clinical
concern, a repeat evaluation in 7-10 days is recommended if clinically
warranted.

## 2012-09-17 ENCOUNTER — Emergency Department: Payer: Self-pay | Admitting: Emergency Medicine

## 2012-09-17 IMAGING — CR DG CHEST 2V
1 series · 2 of 2 positions shown · non-contrast
Comparison: none

REASON FOR EXAM: cough
COMMENTS:   May transport without cardiac monitor

[Series 2: w chest pa · 0.14mm/px · 2 of 2 slices shown]
[im 1/2]
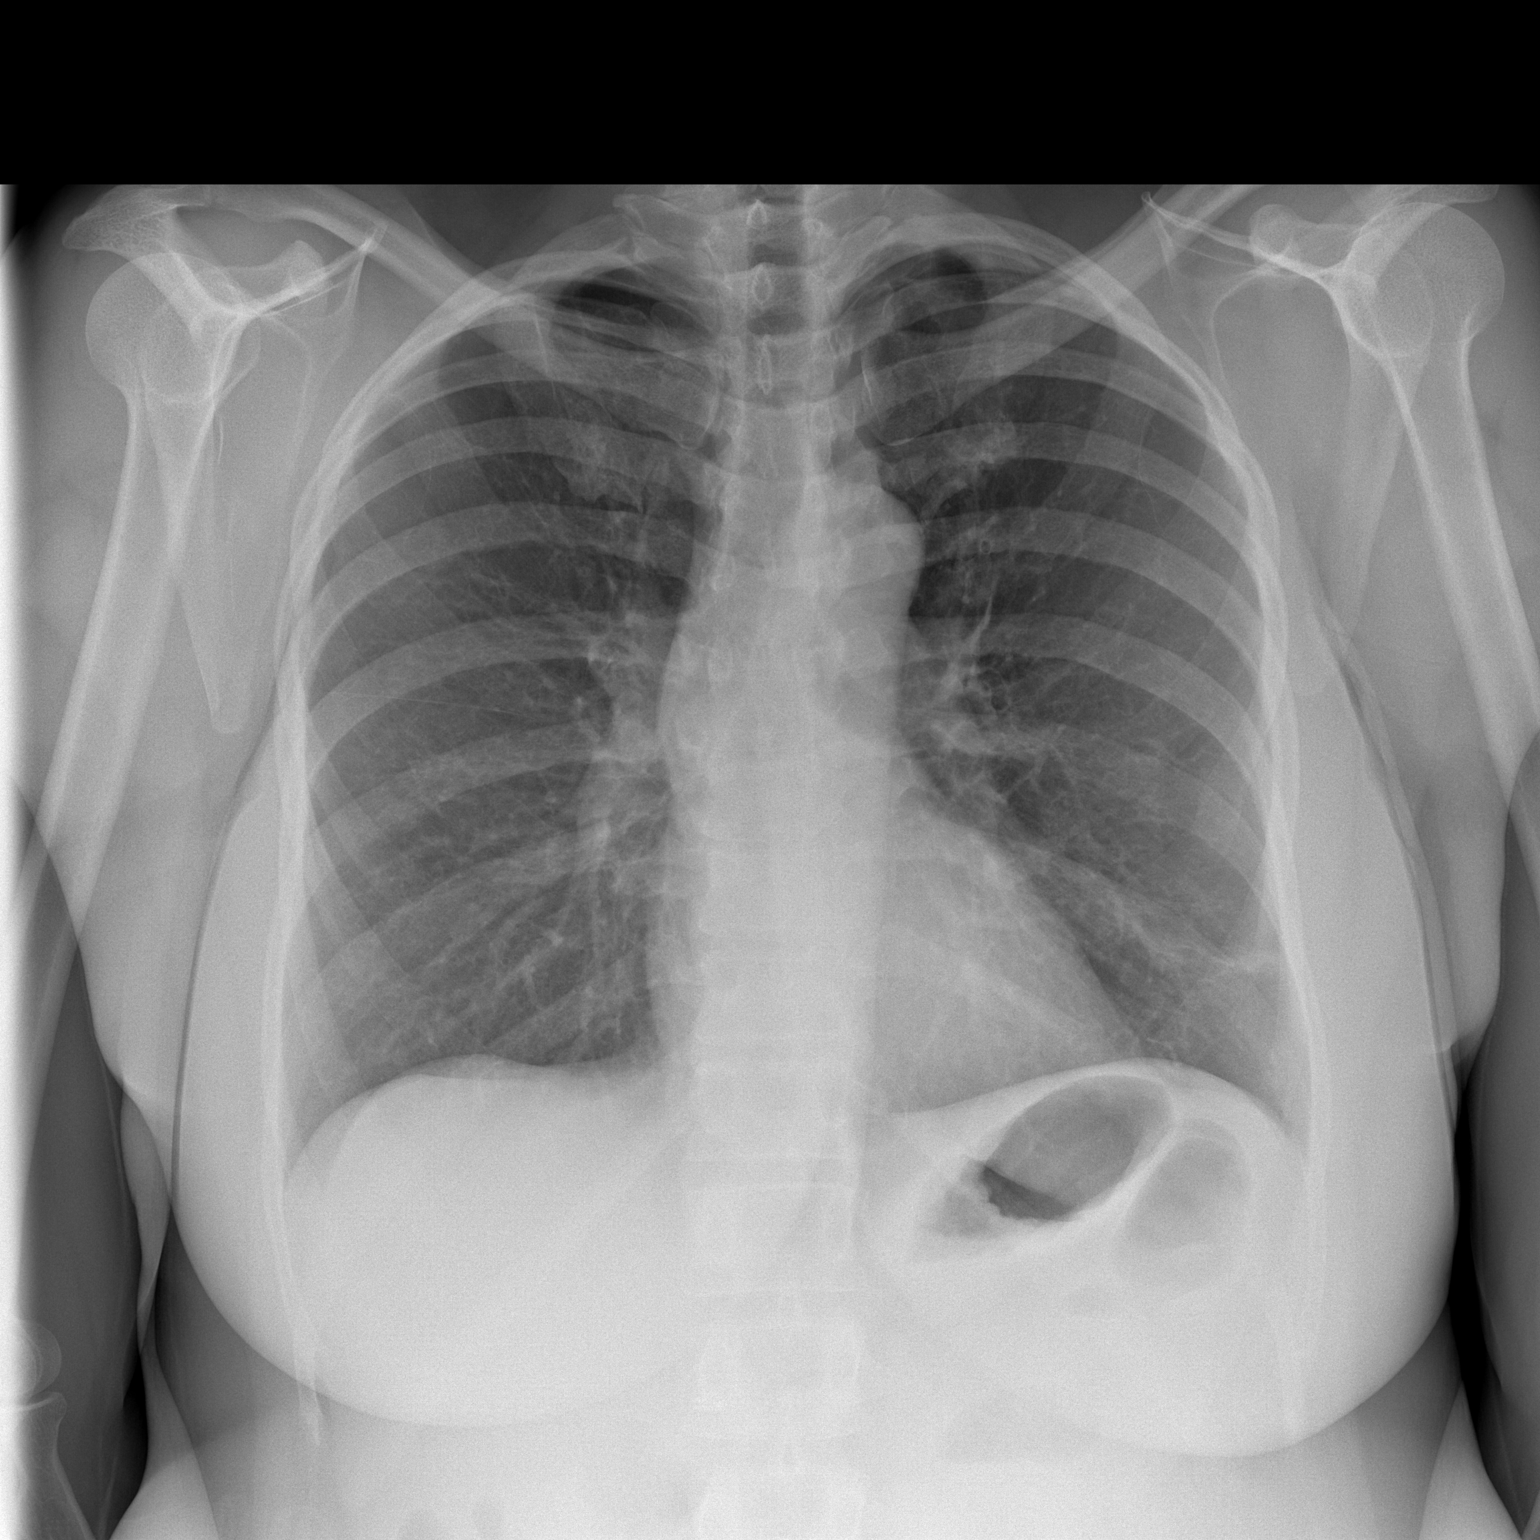
[im 2/2]
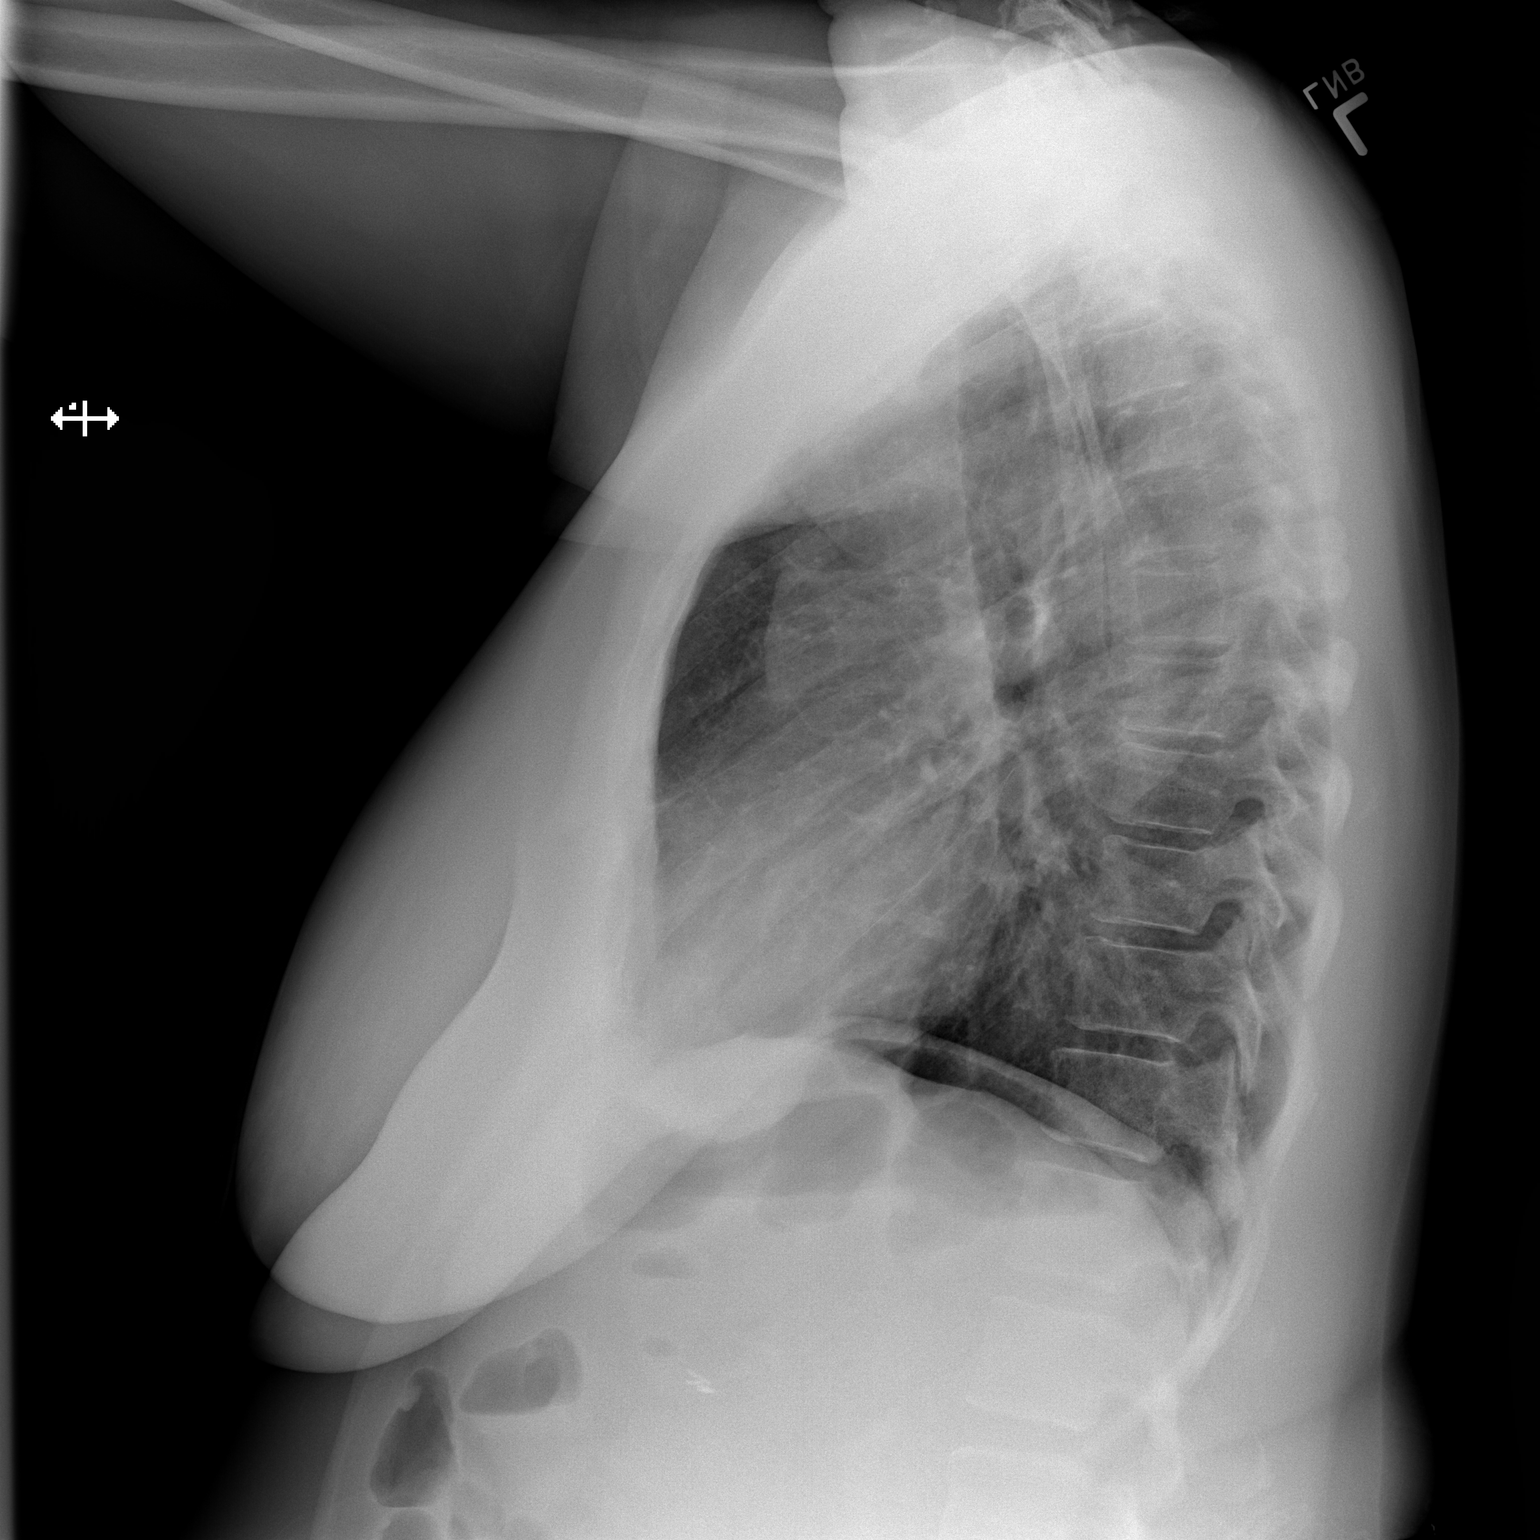

[2 of 2 positions shown; findings below may reference images not displayed]

PROCEDURE:     DXR - DXR CHEST PA (OR AP) AND LATERAL  - [DATE]  [DATE]

RESULT:     The patient was not cooperative during the study.

The lungs are well-expanded. There is minimal increased density in the left
lower lobe laterally. There is no pleural effusion or alveolar infiltrate.
The cardiac silhouette is normal in size. The pulmonary vascularity is not
engorged. The bony thorax exhibits no acute abnormality.
IMPRESSION: There is minimal increased density in the left lower lung
on the frontal film which may reflect subsegmental atelectasis. There is no
focal pneumonia.

[REDACTED]

## 2012-09-19 LAB — BETA STREP CULTURE(ARMC)

## 2012-12-02 ENCOUNTER — Encounter (HOSPITAL_COMMUNITY): Payer: Self-pay | Admitting: Emergency Medicine

## 2012-12-02 ENCOUNTER — Emergency Department (HOSPITAL_COMMUNITY): Payer: 59

## 2012-12-02 ENCOUNTER — Emergency Department (HOSPITAL_COMMUNITY)
Admission: EM | Admit: 2012-12-02 | Discharge: 2012-12-02 | Disposition: A | Discharge status: Home / Self Care | Payer: 59 | Attending: Emergency Medicine | Admitting: Emergency Medicine

## 2012-12-02 DIAGNOSIS — J3489 Other specified disorders of nose and nasal sinuses: Secondary | ICD-10-CM | POA: Insufficient documentation

## 2012-12-02 DIAGNOSIS — R059 Cough, unspecified: Secondary | ICD-10-CM | POA: Insufficient documentation

## 2012-12-02 DIAGNOSIS — J45901 Unspecified asthma with (acute) exacerbation: Secondary | ICD-10-CM | POA: Insufficient documentation

## 2012-12-02 DIAGNOSIS — J40 Bronchitis, not specified as acute or chronic: Secondary | ICD-10-CM

## 2012-12-02 DIAGNOSIS — Z79899 Other long term (current) drug therapy: Secondary | ICD-10-CM | POA: Insufficient documentation

## 2012-12-02 DIAGNOSIS — I10 Essential (primary) hypertension: Secondary | ICD-10-CM | POA: Insufficient documentation

## 2012-12-02 DIAGNOSIS — Z720 Tobacco use: Secondary | ICD-10-CM

## 2012-12-02 DIAGNOSIS — R05 Cough: Secondary | ICD-10-CM | POA: Insufficient documentation

## 2012-12-02 DIAGNOSIS — F17200 Nicotine dependence, unspecified, uncomplicated: Secondary | ICD-10-CM | POA: Insufficient documentation

## 2012-12-02 HISTORY — DX: Essential (primary) hypertension: I10

## 2012-12-02 HISTORY — DX: Unspecified asthma, uncomplicated: J45.909

## 2012-12-02 IMAGING — CR DG CHEST 2V
2 series · 2 of 2 positions shown · non-contrast
Comparison: None.

CLINICAL DATA: Shortness of breath.

CHEST - 2 VIEW

[w chest pa]
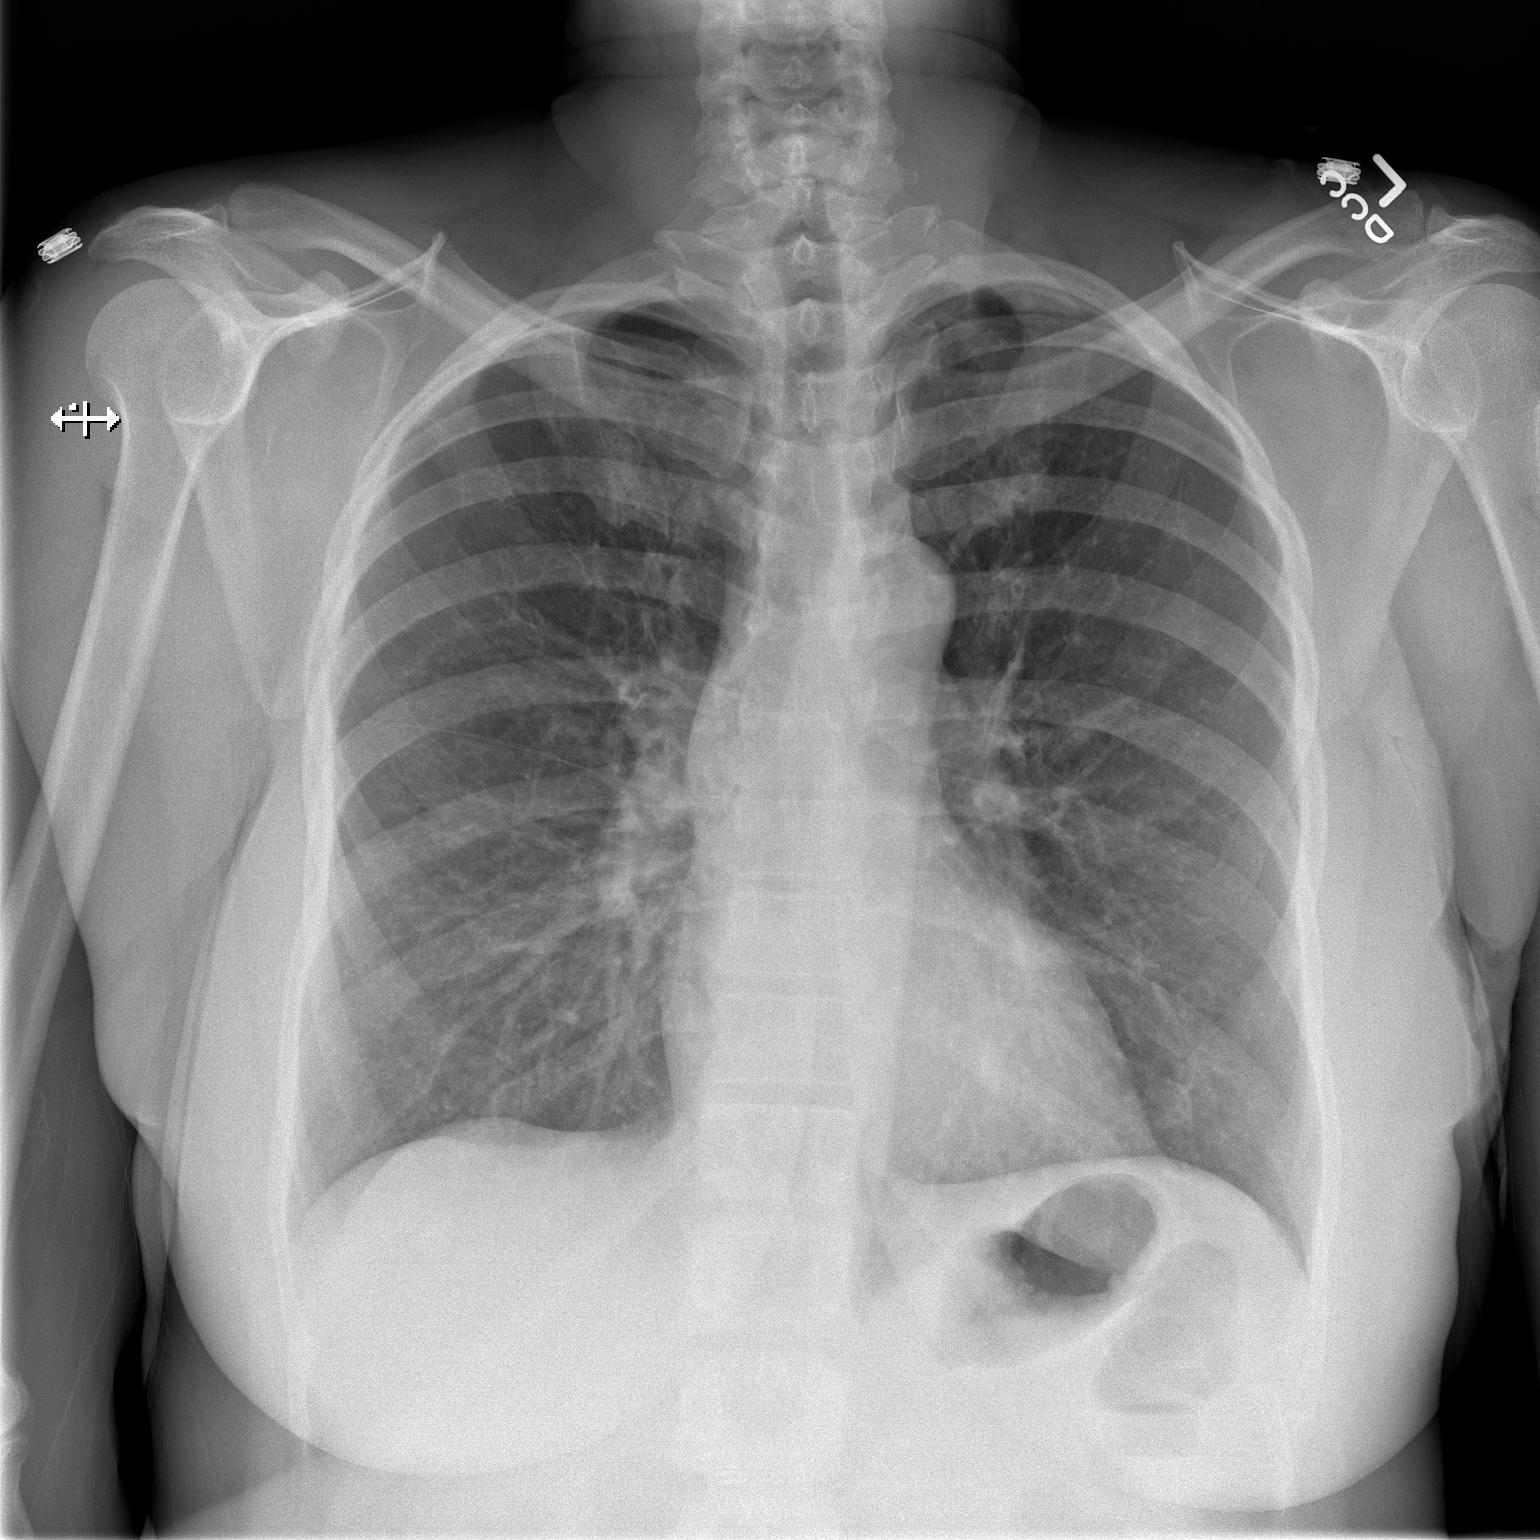

[w chest lat]
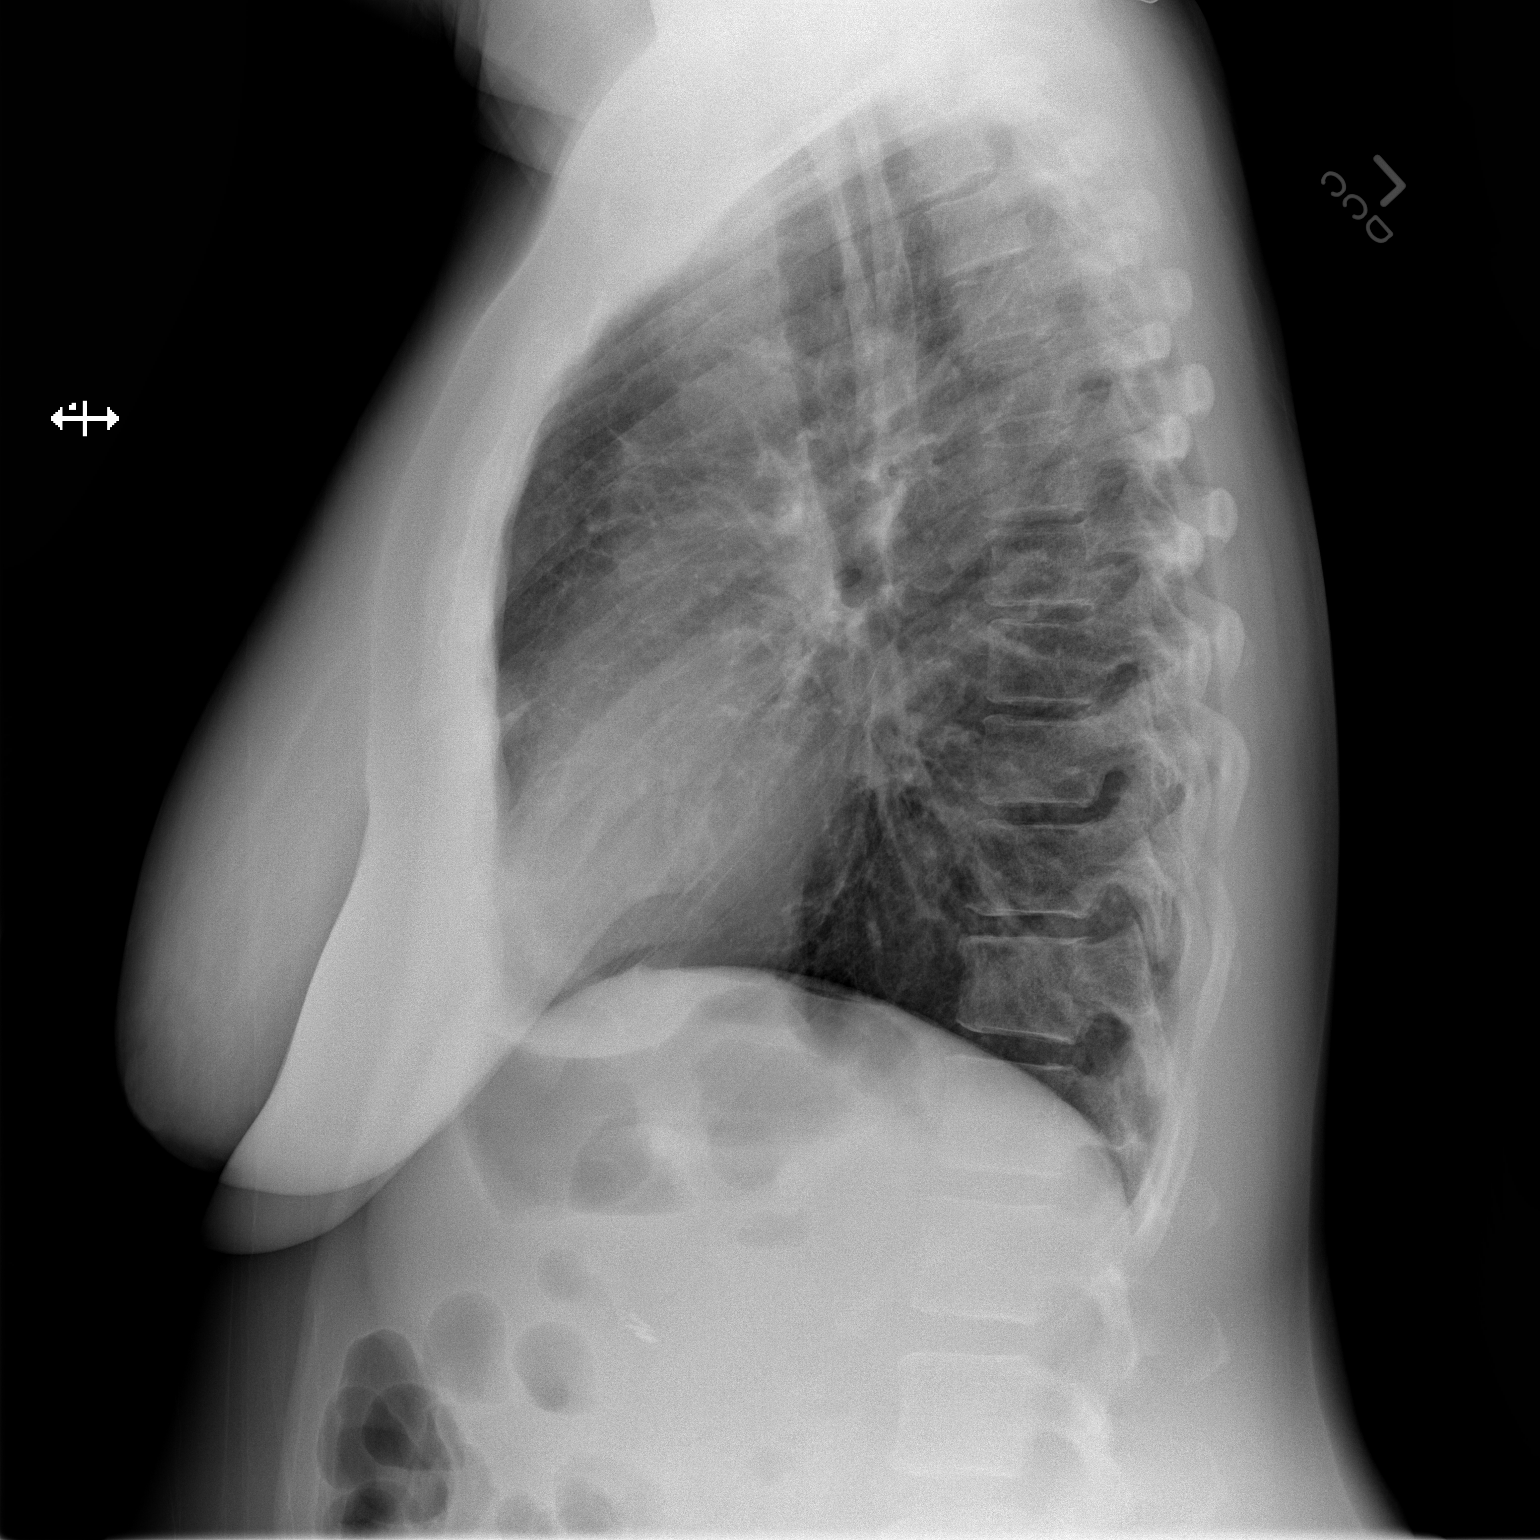

[2 of 2 positions shown; findings below may reference images not displayed]

FINDINGS: The lungs are clear without focal infiltrate, edema,
pneumothorax or pleural effusion. Interstitial markings are
diffusely coarsened with chronic features. The cardiopericardial
silhouette is within normal limits for size. Imaged bony structures
of the thorax are intact.
IMPRESSION: Mild chronic interstitial coarsening.  No acute cardiopulmonary
process.

## 2012-12-02 MED ORDER — ALBUTEROL SULFATE (5 MG/ML) 0.5% IN NEBU
5.0000 mg | INHALATION_SOLUTION | Freq: Once | RESPIRATORY_TRACT | Status: AC
  Administered 2012-12-02: 5 mg via RESPIRATORY_TRACT
  Filled 2012-12-02: qty 1

## 2012-12-02 MED ORDER — PREDNISONE 20 MG PO TABS
60.0000 mg | ORAL_TABLET | Freq: Once | ORAL | Status: AC
  Administered 2012-12-02: 60 mg via ORAL
  Filled 2012-12-02: qty 3

## 2012-12-02 MED ORDER — IPRATROPIUM BROMIDE 0.02 % IN SOLN
0.5000 mg | Freq: Once | RESPIRATORY_TRACT | Status: AC
  Administered 2012-12-02: 0.5 mg via RESPIRATORY_TRACT
  Filled 2012-12-02: qty 2.5

## 2012-12-02 MED ORDER — ALBUTEROL SULFATE HFA 108 (90 BASE) MCG/ACT IN AERS
2.0000 | INHALATION_SPRAY | RESPIRATORY_TRACT | Status: DC
  Administered 2012-12-02: 2 via RESPIRATORY_TRACT
  Filled 2012-12-02: qty 6.7

## 2012-12-02 NOTE — ED Provider Notes (Signed)
History     CSN: [id number]  Arrival date & time 12/02/12  1207   First MD Initiated Contact with Patient 12/02/12 1234      Chief Complaint  Patient presents with  . Shortness of Breath    (Consider location/radiation/quality/duration/timing/severity/associated sxs/prior treatment) The history is provided by the patient.   the patient reports mild cough and congestion the past several days.  She has a history of asthma and hypertension.  She currently continues to smoke cigarettes.  Husband with similar upper respiratory symptoms.  No sore throat.  Chills without documented fever.  No chest pain or palpitations.  No abdominal pain.  No nausea vomiting diarrhea.  Symptoms are mild to moderate in severity.  Nothing worsens or improves her symptoms.  No orthopnea or lower extremity edema.  Past Medical History  Diagnosis Date  . Asthma   . Hypertension     History reviewed. No pertinent past surgical history.  History reviewed. No pertinent family history.  History  Substance Use Topics  . Smoking status: Current Every Day Smoker    Types: Cigarettes  . Smokeless tobacco: Not on file  . Alcohol Use: No    OB History   Grav Para Term Preterm Abortions TAB SAB Ect Mult Living                  Review of Systems  Respiratory: Positive for shortness of breath.   All other systems reviewed and are negative.    Allergies  Aspirin  Home Medications   Current Outpatient Rx  Name  Route  Sig  Dispense  Refill  . dextromethorphan-guaiFENesin (MUCINEX DM) 30-600 MG per 12 hr tablet   Oral   Take 1 tablet by mouth every 12 (twelve) hours.         Marland Kitchen lisinopril (PRINIVIL,ZESTRIL) 10 MG tablet   Oral   Take 20 mg by mouth daily.         . Pseudoeph-CPM-DM-APAP (TYLENOL COLD) 30-2-15-325 MG TABS   Oral   Take 2 capsules by mouth every 4 (four) hours.           BP 160/83  Pulse 82  Temp(Src) 98.7 F (37.1 C) (Oral)  Ht 5\' 5"  (1.651 m)  Wt 165 lb (74.844 kg)   BMI 27.46 kg/m2  SpO2 95%  LMP 12/01/2012  Physical Exam  Nursing note and vitals reviewed. Constitutional: She is oriented to person, place, and time. She appears well-developed and well-nourished. No distress.  HENT:  Head: Normocephalic and atraumatic.  Eyes: EOM are normal.  Neck: Normal range of motion.  Cardiovascular: Normal rate, regular rhythm and normal heart sounds.   Pulmonary/Chest: Effort normal and breath sounds normal. No respiratory distress. She has no rales.  Abdominal: Soft. She exhibits no distension. There is no tenderness.  Musculoskeletal: Normal range of motion.  Neurological: She is alert and oriented to person, place, and time.  Skin: Skin is warm and dry.  Psychiatric: She has a normal mood and affect. Judgment normal.    ED Course  Procedures (including critical care time)  Labs Reviewed - No data to display Dg Chest 2 View  12/02/2012  *RADIOLOGY REPORT*  Clinical Data: Shortness of breath.  CHEST - 2 VIEW  Comparison: None.  Findings: The lungs are clear without focal infiltrate, edema, pneumothorax or pleural effusion. Interstitial markings are diffusely coarsened with chronic features. The cardiopericardial silhouette is within normal limits for size. Imaged bony structures of the thorax are intact.  IMPRESSION:  Mild chronic interstitial coarsening.  No acute cardiopulmonary process.   Original Report Authenticated By: Kennith Center, M.D.    I personally reviewed the imaging tests through PACS system I reviewed available ER/hospitalization records through the EMR   1. Bronchitis   2. Tobacco abuse       MDM  Suspect bronchitis.  Chest x-ray clear.  Feels better after albuterol and Atrovent.  Patient is been told to stop smoking cigarettes.  Discharge home with albuterol for cough.        Lyanne Co, MD 12/02/12 1351

## 2012-12-02 NOTE — ED Notes (Signed)
RT currently at Pt bedside administering medication.

## 2012-12-02 NOTE — ED Notes (Addendum)
Pt states she his having shortness of breath that feels like an exacerbation of her asthma. Pt states she ran out of asthma medication.

## 2013-05-06 ENCOUNTER — Emergency Department (HOSPITAL_COMMUNITY)
Admission: EM | Admit: 2013-05-06 | Discharge: 2013-05-06 | Disposition: A | Discharge status: Home / Self Care | Payer: 59 | Attending: Emergency Medicine | Admitting: Emergency Medicine

## 2013-05-06 ENCOUNTER — Encounter (HOSPITAL_COMMUNITY): Payer: Self-pay | Admitting: Emergency Medicine

## 2013-05-06 ENCOUNTER — Emergency Department (HOSPITAL_COMMUNITY): Payer: 59

## 2013-05-06 DIAGNOSIS — I10 Essential (primary) hypertension: Secondary | ICD-10-CM | POA: Insufficient documentation

## 2013-05-06 DIAGNOSIS — D251 Intramural leiomyoma of uterus: Secondary | ICD-10-CM

## 2013-05-06 DIAGNOSIS — N898 Other specified noninflammatory disorders of vagina: Secondary | ICD-10-CM | POA: Insufficient documentation

## 2013-05-06 DIAGNOSIS — Z79899 Other long term (current) drug therapy: Secondary | ICD-10-CM | POA: Insufficient documentation

## 2013-05-06 DIAGNOSIS — N949 Unspecified condition associated with female genital organs and menstrual cycle: Secondary | ICD-10-CM | POA: Insufficient documentation

## 2013-05-06 DIAGNOSIS — N939 Abnormal uterine and vaginal bleeding, unspecified: Secondary | ICD-10-CM

## 2013-05-06 DIAGNOSIS — J45909 Unspecified asthma, uncomplicated: Secondary | ICD-10-CM | POA: Insufficient documentation

## 2013-05-06 DIAGNOSIS — D259 Leiomyoma of uterus, unspecified: Secondary | ICD-10-CM | POA: Insufficient documentation

## 2013-05-06 DIAGNOSIS — R110 Nausea: Secondary | ICD-10-CM | POA: Insufficient documentation

## 2013-05-06 DIAGNOSIS — R1032 Left lower quadrant pain: Secondary | ICD-10-CM | POA: Insufficient documentation

## 2013-05-06 DIAGNOSIS — N760 Acute vaginitis: Secondary | ICD-10-CM | POA: Insufficient documentation

## 2013-05-06 DIAGNOSIS — F17200 Nicotine dependence, unspecified, uncomplicated: Secondary | ICD-10-CM | POA: Insufficient documentation

## 2013-05-06 DIAGNOSIS — A499 Bacterial infection, unspecified: Secondary | ICD-10-CM | POA: Insufficient documentation

## 2013-05-06 DIAGNOSIS — Z3202 Encounter for pregnancy test, result negative: Secondary | ICD-10-CM | POA: Insufficient documentation

## 2013-05-06 DIAGNOSIS — B9689 Other specified bacterial agents as the cause of diseases classified elsewhere: Secondary | ICD-10-CM | POA: Insufficient documentation

## 2013-05-06 DIAGNOSIS — Z792 Long term (current) use of antibiotics: Secondary | ICD-10-CM | POA: Insufficient documentation

## 2013-05-06 LAB — URINE MICROSCOPIC-ADD ON

## 2013-05-06 LAB — URINALYSIS, ROUTINE W REFLEX MICROSCOPIC
Bilirubin Urine: NEGATIVE
Glucose, UA: NEGATIVE mg/dL
Ketones, ur: NEGATIVE mg/dL
Leukocytes, UA: NEGATIVE
Nitrite: NEGATIVE
Protein, ur: NEGATIVE mg/dL
Specific Gravity, Urine: 1.012 (ref 1.005–1.030)
Urobilinogen, UA: 0.2 mg/dL (ref 0.0–1.0)
pH: 6.5 (ref 5.0–8.0)

## 2013-05-06 LAB — GC/CHLAMYDIA PROBE AMP
CT Probe RNA: NEGATIVE
GC Probe RNA: NEGATIVE

## 2013-05-06 LAB — WET PREP, GENITAL
Trich, Wet Prep: NONE SEEN
WBC, Wet Prep HPF POC: NONE SEEN
Yeast Wet Prep HPF POC: NONE SEEN

## 2013-05-06 LAB — PREGNANCY, URINE: Preg Test, Ur: NEGATIVE

## 2013-05-06 IMAGING — US US PELVIS COMPLETE
1 series · 13 of 25 positions shown · non-contrast
Comparison: None.

CLINICAL DATA: Vaginal bleeding



[Series 1: us pelvis complete · 0.30mm/px · 13 of 117 slices shown]
[im 1/117]
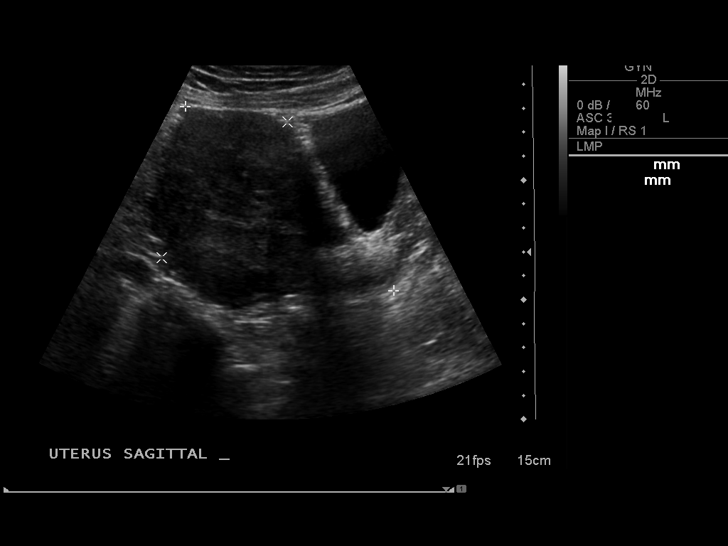
[im 10/117]
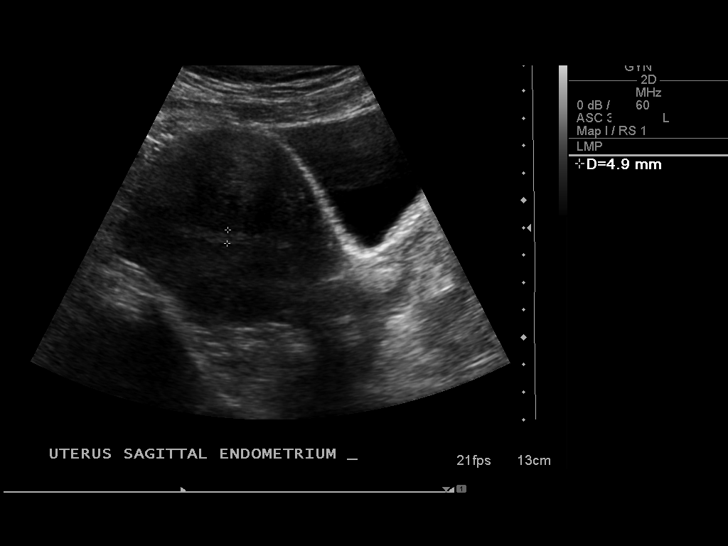
[im 20/117]
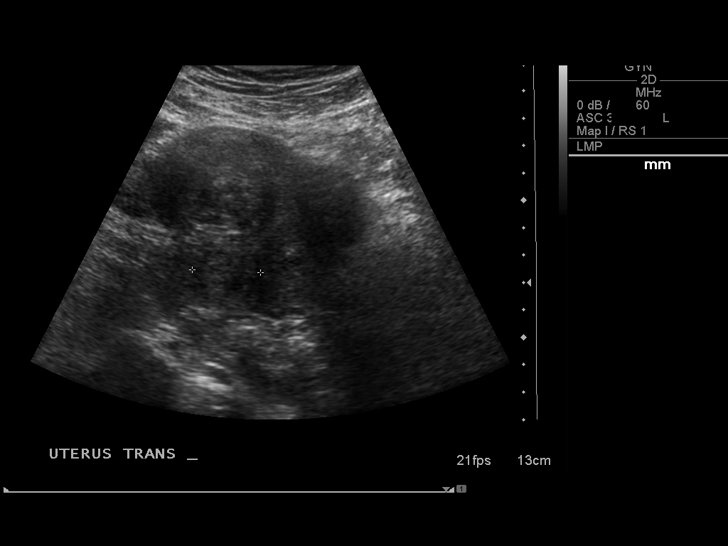
[im 30/117]
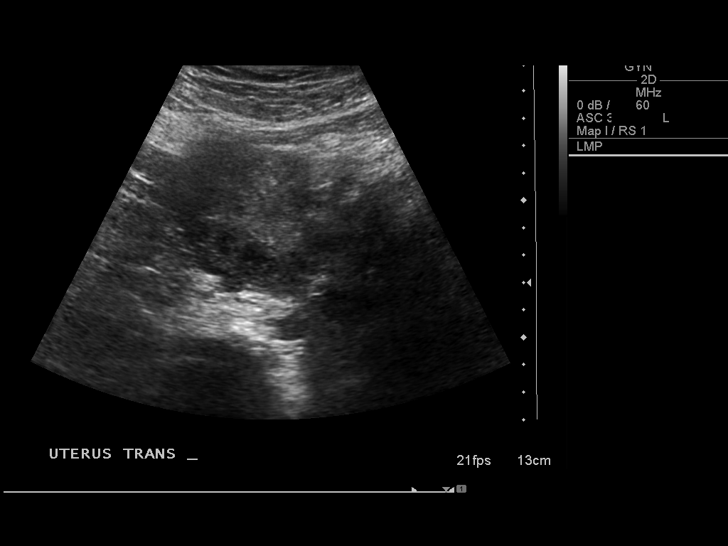
[im 39/117]
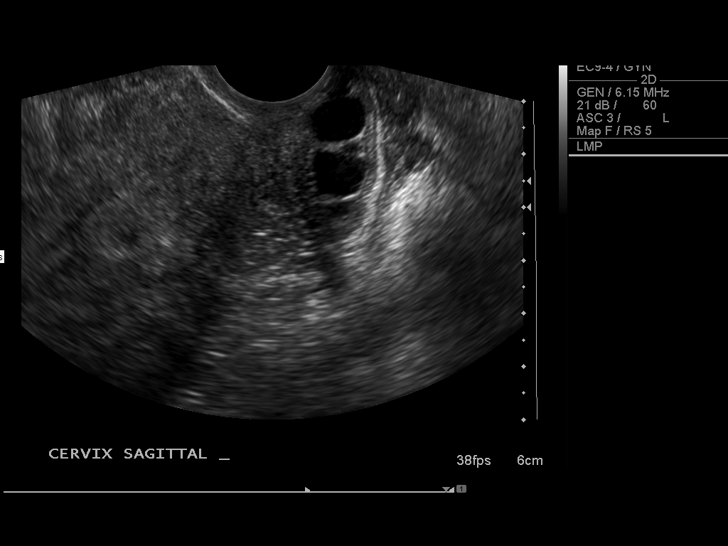
[im 49/117]
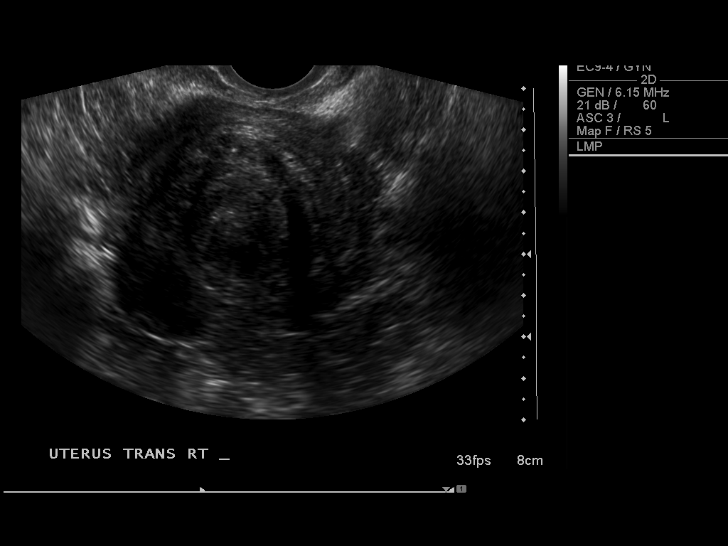
[im 59/117]
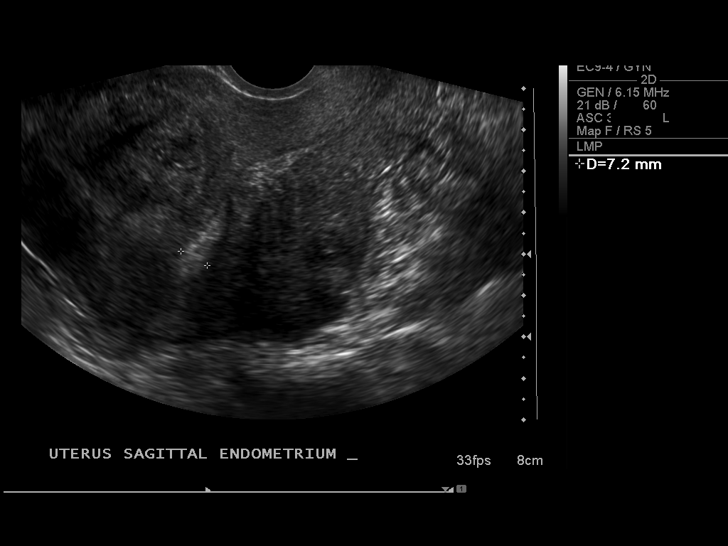
[im 68/117]
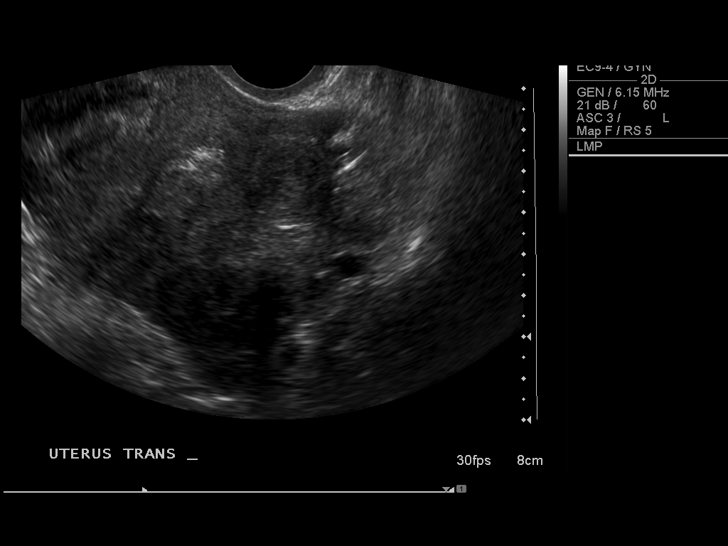
[im 78/117]
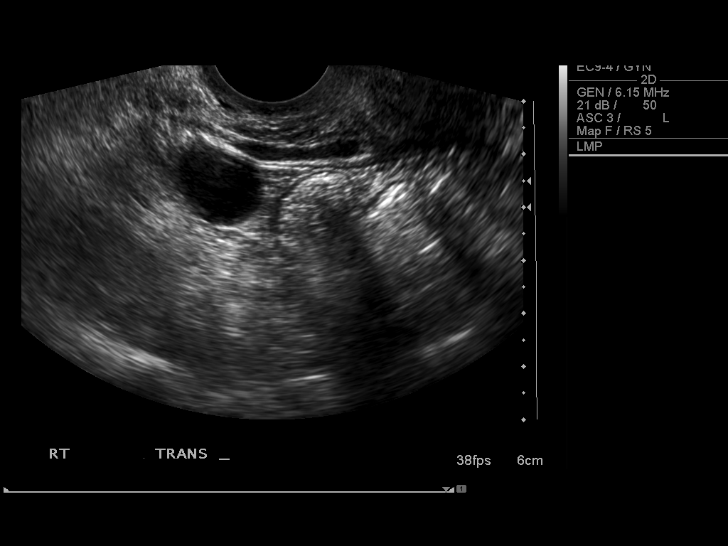
[im 88/117]
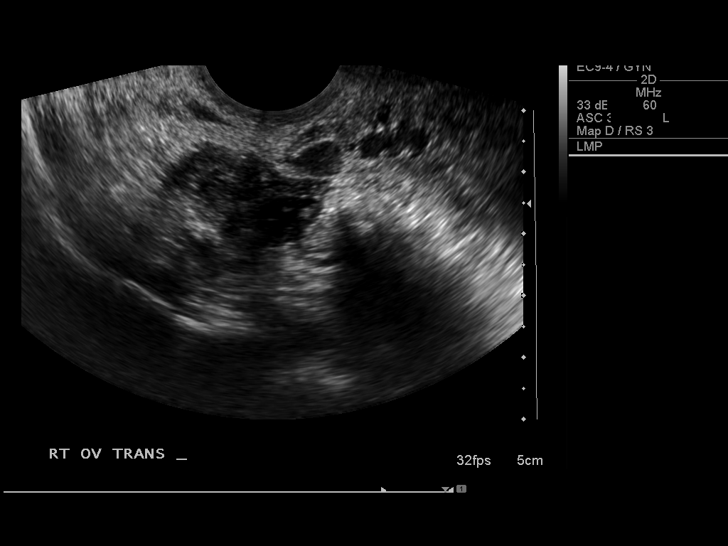
[im 97/117]
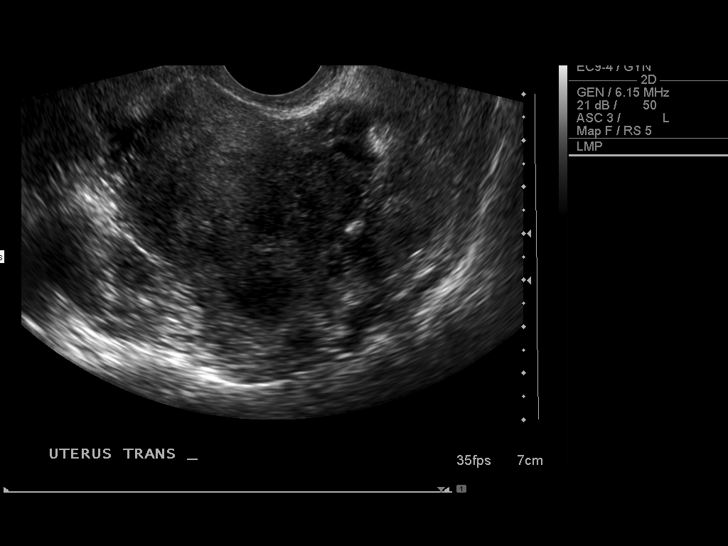
[im 107/117]
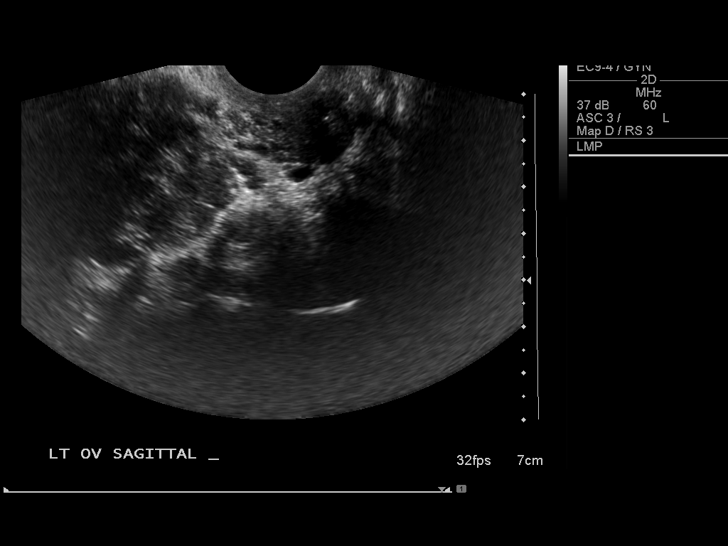
[im 117/117]
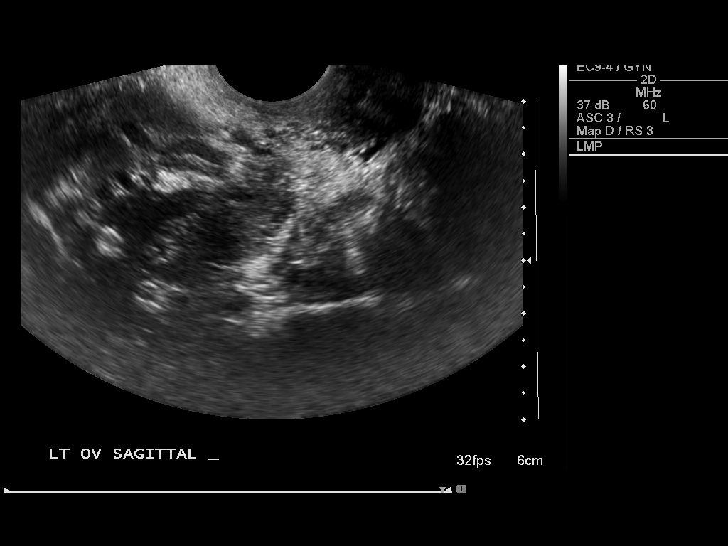

[13 of 25 positions shown; findings below may reference images not displayed]

FINDINGS: Uterus:  Anteverted, anteflexed.  11.6 x 8.3 x 7.7 cm.  Multiple
fibroids noted with dominant lesions as follows:

Left anterolateral uterine fundus/body, intramural, 4.4 x 4.3 x
cm

Posterior mid uterine body, subserosal / intramural, 4.1 x 3.9 x
2.4 cm

Left lateral uterine body, intramural, 2.5 x 2.3 x 2.3 cm

Endometrium: 7 mm.  Uniformly echogenic.  Some detail obscured by
overlying fibroids.  One fibroid in the posterior uterine body
demonstrate mild mass effect upon the endometrium with anterior
displacement of the endometrial stripe, measuring 1.9 x 1.7 x
cm.

Right ovary: 2.4 x 1.9 x 1.3 cm.  Normal.

Left ovary: 2.5 x 1.7 x 1.2 cm.  Normal.

Other Findings:  Small free fluid in the cul-de-sac.
IMPRESSION: Uterine fibroids as above, predominately intramural, however one
fibroid within the posterior uterine body produces mild mass effect
anteriorly upon the endometrial stripe.  This may be associated
with vaginal bleeding.  Outpatient nonemergent pelvic MRI with
contrast may be helpful to further define the anatomic relationship
of fibroids and determine further options for treatment, including
potential interventional radiology minimally invasive options.

## 2013-05-06 MED ORDER — METRONIDAZOLE 500 MG PO TABS: 500.0000 mg | ORAL_TABLET | Freq: Two times a day (BID) | ORAL | Status: DC

## 2013-05-06 MED ORDER — HYDROCODONE-ACETAMINOPHEN 5-325 MG PO TABS: ORAL_TABLET | ORAL | Status: DC

## 2013-05-06 MED ORDER — CEFTRIAXONE SODIUM 250 MG IJ SOLR
250.0000 mg | Freq: Once | INTRAMUSCULAR | Status: AC
  Administered 2013-05-06: 250 mg via INTRAMUSCULAR
  Filled 2013-05-06: qty 250

## 2013-05-06 MED ORDER — HYDROCODONE-ACETAMINOPHEN 5-325 MG PO TABS
2.0000 | ORAL_TABLET | Freq: Once | ORAL | Status: AC
  Administered 2013-05-06: 2 via ORAL
  Filled 2013-05-06: qty 2

## 2013-05-06 MED ORDER — AZITHROMYCIN 250 MG PO TABS
1000.0000 mg | ORAL_TABLET | Freq: Once | ORAL | Status: AC
  Administered 2013-05-06: 1000 mg via ORAL
  Filled 2013-05-06: qty 4

## 2013-05-06 NOTE — Progress Notes (Signed)
[facility] ED CM reviewed EPIC notes to find pt with united health care coverage, stating she recently moved to [place], zip code listed as [phone] and without a pcp or OB GYN.  Pt provided with a list of in network united health care providers for internal medicine and ob gyn within 5 miles of zip code [phone] Pt appreciative of resources and confirmed she was still active with united health care coverage

## 2013-05-06 NOTE — ED Notes (Signed)
Pt c/o intermittent vaginal bleeding x2 weeks and llq abd pain.  Pt reports hx of L ovarian cyst.  Denies dizziness or feeling faint.

## 2013-05-06 NOTE — ED Provider Notes (Signed)
CSN: [id number]     Arrival date & time 05/06/13  1117 History   First MD Initiated Contact with Patient 05/06/13 1126     Chief Complaint  Patient presents with  . Vaginal Bleeding   (Consider location/radiation/quality/duration/timing/severity/associated sxs/prior Treatment) HPI Pt is a 52yo female with hx of HTN c/o intermittent vaginal bleeding for last 2 weeks associated with constant cramping in LLQ, 10/10, not relieved by Midol.  Pt also c/o mild nausea but no vomiting.  Last regular menstrual period was on 04/11/13 and has had irregular bleeding for last 2 weeks.  Pt states she is concerned for vaginal infections "I don't know what my husband does" Denies vaginal itching or lesions.  Does report hx of ovarian cyst when she was 52yo and was given a "pill" to help dissolve the cyst.  Recently moved from [place] so does not have PCP or GYN at this time.    Past Medical History  Diagnosis Date  . Asthma   . Hypertension    History reviewed. No pertinent past surgical history. History reviewed. No pertinent family history. History  Substance Use Topics  . Smoking status: Current Every Day Smoker    Types: Cigarettes  . Smokeless tobacco: Not on file  . Alcohol Use: No   OB History   Grav Para Term Preterm Abortions TAB SAB Ect Mult Living                 Review of Systems  Constitutional: Negative for fever and chills.  Gastrointestinal: Positive for nausea and abdominal pain ( LLQ). Negative for vomiting.  Genitourinary: Positive for vaginal bleeding and vaginal pain. Negative for dysuria, frequency, hematuria and flank pain.  All other systems reviewed and are negative.    Allergies  Aspirin  Home Medications   Current Outpatient Rx  Name  Route  Sig  Dispense  Refill  . ibuprofen (ADVIL,MOTRIN) 200 MG tablet   Oral   Take 200 mg by mouth every 6 (six) hours as needed for pain (cramps).         Marland Kitchen lisinopril (PRINIVIL,ZESTRIL) 20 MG tablet   Oral   Take 20 mg  by mouth daily.         Marland Kitchen HYDROcodone-acetaminophen (NORCO/VICODIN) 5-325 MG per tablet      Take 1-2 pills every 4-6 hours as needed for pain.   6 tablet   0   . metroNIDAZOLE (FLAGYL) 500 MG tablet   Oral   Take 1 tablet (500 mg total) by mouth 2 (two) times daily.   14 tablet   0    BP 181/90  Pulse 73  Temp(Src) 98.1 F (36.7 C) (Oral)  Resp 14  SpO2 97%  LMP 04/11/2013 Physical Exam  Nursing note and vitals reviewed. Constitutional: She appears well-developed and well-nourished. No distress.  HENT:  Head: Normocephalic and atraumatic.  Eyes: Conjunctivae are normal. No scleral icterus.  Neck: Normal range of motion.  Cardiovascular: Normal rate, regular rhythm and normal heart sounds.   Pulmonary/Chest: Effort normal and breath sounds normal. No respiratory distress. She has no wheezes. She has no rales. She exhibits no tenderness.  Abdominal: Soft. Bowel sounds are normal. She exhibits no distension and no mass. There is tenderness (LLQ). There is no rebound and no guarding.  Genitourinary: Uterus normal. Pelvic exam was performed with patient supine. No labial fusion. There is no rash, tenderness, lesion or injury on the right labia. There is no rash, tenderness, lesion or injury on the left  labia. Cervix exhibits discharge ( blood). Cervix exhibits no motion tenderness and no friability. Right adnexum displays no mass, no tenderness and no fullness. Left adnexum displays tenderness. Left adnexum displays no mass and no fullness. There is bleeding around the vagina. No tenderness around the vagina.  Moderate vaginal bleeding.  Tenderness in left adnexa on manual exam.  No adnexal masses or CMT.    Musculoskeletal: Normal range of motion.  Neurological: She is alert.  Skin: Skin is warm and dry. She is not diaphoretic.    ED Course  Procedures (including critical care time) Labs Review Labs Reviewed  WET PREP, GENITAL - Abnormal; Notable for the following:    Clue  Cells Wet Prep HPF POC FEW (*)    All other components within normal limits  URINALYSIS, ROUTINE W REFLEX MICROSCOPIC - Abnormal; Notable for the following:    Hgb urine dipstick LARGE (*)    All other components within normal limits  GC/CHLAMYDIA PROBE AMP  PREGNANCY, URINE  URINE MICROSCOPIC-ADD ON   Imaging Review US Transvaginal Non-ob  05/06/2013   *RADIOLOGY REPORT*  Clinical Data: Vaginal bleeding  TRANSABDOMINAL AND TRANSVAGINAL ULTRASOUND OF PELVIS  Technique:  Both transabdominal and transvaginal ultrasound examinations of the pelvis were performed.  Transabdominal technique was performed for global imaging of the pelvis including uterus, ovaries, adnexal regions, and pelvic cul-de-sac.  It was necessary to proceed with endovaginal exam following the transabdominal exam to visualize the endometrium and ovaries.  Comparison:  None.  Findings: Uterus:  Anteverted, anteflexed.  11.6 x 8.3 x 7.7 cm.  Multiple fibroids noted with dominant lesions as follows:  Left anterolateral uterine fundus/body, intramural, 4.4 x 4.3 x 4.1 cm  Posterior mid uterine body, subserosal / intramural, 4.1 x 3.9 x 2.4 cm  Left lateral uterine body, intramural, 2.5 x 2.3 x 2.3 cm  Endometrium: 7 mm.  Uniformly echogenic.  Some detail obscured by overlying fibroids.  One fibroid in the posterior uterine body demonstrate mild mass effect upon the endometrium with anterior displacement of the endometrial stripe, measuring 1.9 x 1.7 x 1.6 cm.  Right ovary: 2.4 x 1.9 x 1.3 cm.  Normal.  Left ovary: 2.5 x 1.7 x 1.2 cm.  Normal.  Other Findings:  Small free fluid in the cul-de-sac.  IMPRESSION: Uterine fibroids as above, predominately intramural, however one fibroid within the posterior uterine body produces mild mass effect anteriorly upon the endometrial stripe.  This may be associated with vaginal bleeding.  Outpatient nonemergent pelvic MRI with contrast may be helpful to further define the anatomic relationship of fibroids  and determine further options for treatment, including potential interventional radiology minimally invasive options.   Original Report Authenticated By: Christiana Pellant, M.D.   US Pelvis Complete  05/06/2013   *RADIOLOGY REPORT*  Clinical Data: Vaginal bleeding  TRANSABDOMINAL AND TRANSVAGINAL ULTRASOUND OF PELVIS  Technique:  Both transabdominal and transvaginal ultrasound examinations of the pelvis were performed.  Transabdominal technique was performed for global imaging of the pelvis including uterus, ovaries, adnexal regions, and pelvic cul-de-sac.  It was necessary to proceed with endovaginal exam following the transabdominal exam to visualize the endometrium and ovaries.  Comparison:  None.  Findings: Uterus:  Anteverted, anteflexed.  11.6 x 8.3 x 7.7 cm.  Multiple fibroids noted with dominant lesions as follows:  Left anterolateral uterine fundus/body, intramural, 4.4 x 4.3 x 4.1 cm  Posterior mid uterine body, subserosal / intramural, 4.1 x 3.9 x 2.4 cm  Left lateral uterine body, intramural, 2.5 x  2.3 x 2.3 cm  Endometrium: 7 mm.  Uniformly echogenic.  Some detail obscured by overlying fibroids.  One fibroid in the posterior uterine body demonstrate mild mass effect upon the endometrium with anterior displacement of the endometrial stripe, measuring 1.9 x 1.7 x 1.6 cm.  Right ovary: 2.4 x 1.9 x 1.3 cm.  Normal.  Left ovary: 2.5 x 1.7 x 1.2 cm.  Normal.  Other Findings:  Small free fluid in the cul-de-sac.  IMPRESSION: Uterine fibroids as above, predominately intramural, however one fibroid within the posterior uterine body produces mild mass effect anteriorly upon the endometrial stripe.  This may be associated with vaginal bleeding.  Outpatient nonemergent pelvic MRI with contrast may be helpful to further define the anatomic relationship of fibroids and determine further options for treatment, including potential interventional radiology minimally invasive options.   Original Report Authenticated By:  Christiana Pellant, M.D.    MDM   1. Vaginal bleeding   2. Fibroids, intramural   3. BV (bacterial vaginosis)    Pt with hx of fibroids, found to have same today.  Suggested f/u outpatient, nonemergent MRI for further evaluation and tx options for fibroids.  F/u with [facility].  Will discharge pt home and have her f/u with [id number] info provided. Return precautions given. Pt verbalized understanding and agreement with tx plan. Vitals: unremarkable. Discharged in stable condition.    Discussed pt with attending during ED encounter.     Junius Finner, PA-C 05/06/13 1440  Junius Finner, PA-C 05/06/13 1440

## 2013-05-06 NOTE — ED Notes (Signed)
Patient transported to Ultrasound 

## 2013-05-07 NOTE — ED Provider Notes (Signed)
Medical screening examination/treatment/procedure(s) were conducted as a shared visit with non-physician practitioner(s) and myself.  I personally evaluated the patient during the encounter.  Vaginal bleeding for 2 weeks. Ultrasound shows uterine fibroids. Referral to GYN. Patient is hemodynamically stable. Hemoglobin normal  Donnetta Hutching, MD 05/07/13 [phone]

## 2013-06-18 ENCOUNTER — Encounter: Payer: 59 | Admitting: Obstetrics & Gynecology

## 2013-07-20 ENCOUNTER — Encounter (HOSPITAL_COMMUNITY): Payer: Self-pay | Admitting: Emergency Medicine

## 2013-07-20 ENCOUNTER — Emergency Department (HOSPITAL_COMMUNITY)
Admission: EM | Admit: 2013-07-20 | Discharge: 2013-07-20 | Disposition: A | Discharge status: Home / Self Care | Payer: 59 | Attending: Emergency Medicine | Admitting: Emergency Medicine

## 2013-07-20 DIAGNOSIS — M79609 Pain in unspecified limb: Secondary | ICD-10-CM | POA: Insufficient documentation

## 2013-07-20 DIAGNOSIS — J45909 Unspecified asthma, uncomplicated: Secondary | ICD-10-CM | POA: Insufficient documentation

## 2013-07-20 DIAGNOSIS — I10 Essential (primary) hypertension: Secondary | ICD-10-CM | POA: Insufficient documentation

## 2013-07-20 DIAGNOSIS — M79661 Pain in right lower leg: Secondary | ICD-10-CM

## 2013-07-20 DIAGNOSIS — F17200 Nicotine dependence, unspecified, uncomplicated: Secondary | ICD-10-CM | POA: Insufficient documentation

## 2013-07-20 DIAGNOSIS — Z79899 Other long term (current) drug therapy: Secondary | ICD-10-CM | POA: Insufficient documentation

## 2013-07-20 DIAGNOSIS — M7989 Other specified soft tissue disorders: Secondary | ICD-10-CM | POA: Insufficient documentation

## 2013-07-20 MED ORDER — METHOCARBAMOL 500 MG PO TABS: 1000.0000 mg | ORAL_TABLET | Freq: Four times a day (QID) | ORAL | Status: DC

## 2013-07-20 MED ORDER — TRAMADOL HCL 50 MG PO TABS
50.0000 mg | ORAL_TABLET | Freq: Once | ORAL | Status: AC
  Administered 2013-07-20: 50 mg via ORAL
  Filled 2013-07-20: qty 1

## 2013-07-20 MED ORDER — TRAMADOL HCL 50 MG PO TABS: 50.0000 mg | ORAL_TABLET | Freq: Four times a day (QID) | ORAL | Status: DC | PRN

## 2013-07-20 NOTE — ED Notes (Signed)
Ultrasound at bedside

## 2013-07-20 NOTE — ED Notes (Signed)
US completed

## 2013-07-20 NOTE — ED Notes (Signed)
Patient states he began having pain to her R calf on Wednesday, described as achy and throbbing. Patient updated on plan of care and delay with ultrasound. Call bell in reach, support person at bedside.

## 2013-07-20 NOTE — ED Notes (Signed)
Pt was walking and felt right calf pain. Pt states her pain has increased over the past few days. States it feels like she pulled a muscle. Pt states she has notices some swelling. Right calf cool to touch

## 2013-07-20 NOTE — Progress Notes (Signed)
VASCULAR LAB PRELIMINARY  PRELIMINARY  PRELIMINARY  PRELIMINARY  Right lower extremity venous Doppler completed.    Preliminary report:  There is no DVT or SVT noted in the left lower extremity.  Bobbyjo Marulanda, RVT 07/20/2013, 3:12 PM

## 2013-07-20 NOTE — ED Provider Notes (Signed)
CSN: [id number]     Arrival date & time 07/20/13  1201 History   First MD Initiated Contact with Patient 07/20/13 1236     CC: Calf pain  (Consider location/radiation/quality/duration/timing/severity/associated sxs/prior Treatment) HPI Comments: Patient with history of hypertension presents with complaint of right calf pain which began acutely 4 days ago while she was walking. Patient notes difficulty walking is at the pain and symptoms became more severe over the past 2 days. She notes mild swelling. She's been taking Tylenol without relief of symptoms. Patient denies history of DVT, estrogen use, recent immobilizations or surgeries. Patient does smoke a half-pack of cigarettes a day. She denies chest pain or shortness of breath. Patient has a family member who had a DVT and she is concerned that she has a blood clot in her leg. The onset of this condition was acute. The course is constant. Aggravating factors: palpation and movement. Alleviating factors: none.    The history is provided by the patient.    Past Medical History  Diagnosis Date  . Asthma   . Hypertension    No past surgical history on file. History reviewed. No pertinent family history. History  Substance Use Topics  . Smoking status: Current Every Day Smoker    Types: Cigarettes  . Smokeless tobacco: Not on file  . Alcohol Use: No   OB History   Grav Para Term Preterm Abortions TAB SAB Ect Mult Living                 Review of Systems  Constitutional: Negative for fever.  HENT: Negative for rhinorrhea and sore throat.   Eyes: Negative for redness.  Respiratory: Negative for cough.   Cardiovascular: Positive for leg swelling. Negative for chest pain.  Gastrointestinal: Negative for nausea, vomiting, abdominal pain and diarrhea.  Genitourinary: Negative for dysuria.  Musculoskeletal: Negative for myalgias.  Skin: Negative for rash.  Neurological: Negative for headaches.    Allergies  Aspirin  Home  Medications   Current Outpatient Rx  Name  Route  Sig  Dispense  Refill  . lisinopril (PRINIVIL,ZESTRIL) 20 MG tablet   Oral   Take 20 mg by mouth daily.         . methocarbamol (ROBAXIN) 500 MG tablet   Oral   Take 2 tablets (1,000 mg total) by mouth 4 (four) times daily.   20 tablet   0   . traMADol (ULTRAM) 50 MG tablet   Oral   Take 1 tablet (50 mg total) by mouth every 6 (six) hours as needed.   15 tablet   0    BP 169/81  Pulse 72  Temp(Src) 98.2 F (36.8 C) (Oral)  Resp 18  SpO2 99% Physical Exam  Nursing note and vitals reviewed. Constitutional: She appears well-developed and well-nourished.  HENT:  Head: Normocephalic and atraumatic.  Eyes: Conjunctivae are normal. Right eye exhibits no discharge. Left eye exhibits no discharge.  Neck: Normal range of motion. Neck supple.  Cardiovascular: Normal rate, regular rhythm and normal heart sounds.   Pulmonary/Chest: Effort normal and breath sounds normal.  Abdominal: Soft. There is no tenderness.  Musculoskeletal:       Right lower leg: She exhibits tenderness and swelling. She exhibits no bony tenderness.       Legs: Neurological: She is alert.  Skin: Skin is warm and dry.  Psychiatric: She has a normal mood and affect.    ED Course  Procedures (including critical care time) Labs Review Labs Reviewed -  No data to display Imaging Review No results found.  EKG Interpretation   None      1:06 PM Patient seen and examined. Work-up initiated. Medications ordered.   Vital signs reviewed and are as follows: Filed Vitals:   07/20/13 1214  BP: 169/81  Pulse: 72  Temp: 98.2 F (36.8 C)  Resp: 18   3:45 PM Pt feels better after tramadol. [place] does not show DVT. Will d/c to home with conservative mgmt, tramadol, robaxin.   Patient counseled on use of narcotic pain medications. Counseled not to combine these medications with others containing tylenol. Urged not to drink alcohol, drive, or perform any other  activities that requires focus while taking these medications. The patient verbalizes understanding and agrees with the plan.   MDM   1. Tenderness of right calf    Calf pain - neg for DVT, LE is neurovascularly intact. ? Muscle spasm/tear. Conservative mgmt indicated.     Renne Crigler, PA-C 07/20/13 1547

## 2013-07-20 NOTE — ED Notes (Signed)
Patient given warm blanket by Gerilyn Pilgrim, NT

## 2013-07-27 NOTE — ED Provider Notes (Signed)
Medical screening examination/treatment/procedure(s) were performed by non-physician practitioner and as supervising physician I was immediately available for consultation/collaboration.  EKG Interpretation   None        Syra Sirmons T Anneli Bing, MD 07/27/13 2122 

## 2013-12-09 DIAGNOSIS — I10 Essential (primary) hypertension: Secondary | ICD-10-CM | POA: Insufficient documentation

## 2013-12-12 ENCOUNTER — Emergency Department (HOSPITAL_COMMUNITY)
Admission: EM | Admit: 2013-12-12 | Discharge: 2013-12-12 | Disposition: A | Discharge status: Home / Self Care | Payer: 59 | Attending: Emergency Medicine | Admitting: Emergency Medicine

## 2013-12-12 ENCOUNTER — Emergency Department (HOSPITAL_COMMUNITY): Payer: 59

## 2013-12-12 ENCOUNTER — Encounter (HOSPITAL_COMMUNITY): Payer: Self-pay | Admitting: Emergency Medicine

## 2013-12-12 DIAGNOSIS — I10 Essential (primary) hypertension: Secondary | ICD-10-CM | POA: Insufficient documentation

## 2013-12-12 DIAGNOSIS — J45901 Unspecified asthma with (acute) exacerbation: Secondary | ICD-10-CM | POA: Insufficient documentation

## 2013-12-12 DIAGNOSIS — R21 Rash and other nonspecific skin eruption: Secondary | ICD-10-CM | POA: Insufficient documentation

## 2013-12-12 DIAGNOSIS — F17200 Nicotine dependence, unspecified, uncomplicated: Secondary | ICD-10-CM | POA: Insufficient documentation

## 2013-12-12 DIAGNOSIS — IMO0002 Reserved for concepts with insufficient information to code with codable children: Secondary | ICD-10-CM | POA: Insufficient documentation

## 2013-12-12 DIAGNOSIS — R0682 Tachypnea, not elsewhere classified: Secondary | ICD-10-CM | POA: Insufficient documentation

## 2013-12-12 DIAGNOSIS — Z791 Long term (current) use of non-steroidal anti-inflammatories (NSAID): Secondary | ICD-10-CM | POA: Insufficient documentation

## 2013-12-12 DIAGNOSIS — Z79899 Other long term (current) drug therapy: Secondary | ICD-10-CM | POA: Insufficient documentation

## 2013-12-12 DIAGNOSIS — J029 Acute pharyngitis, unspecified: Secondary | ICD-10-CM | POA: Insufficient documentation

## 2013-12-12 IMAGING — CR DG CHEST 2V
2 series · 2 of 2 positions shown · non-contrast
Comparison: Chest radiograph performed [DATE]

CLINICAL DATA: Shortness of breath.

EXAM:
CHEST  2 VIEW

[w chest pa]
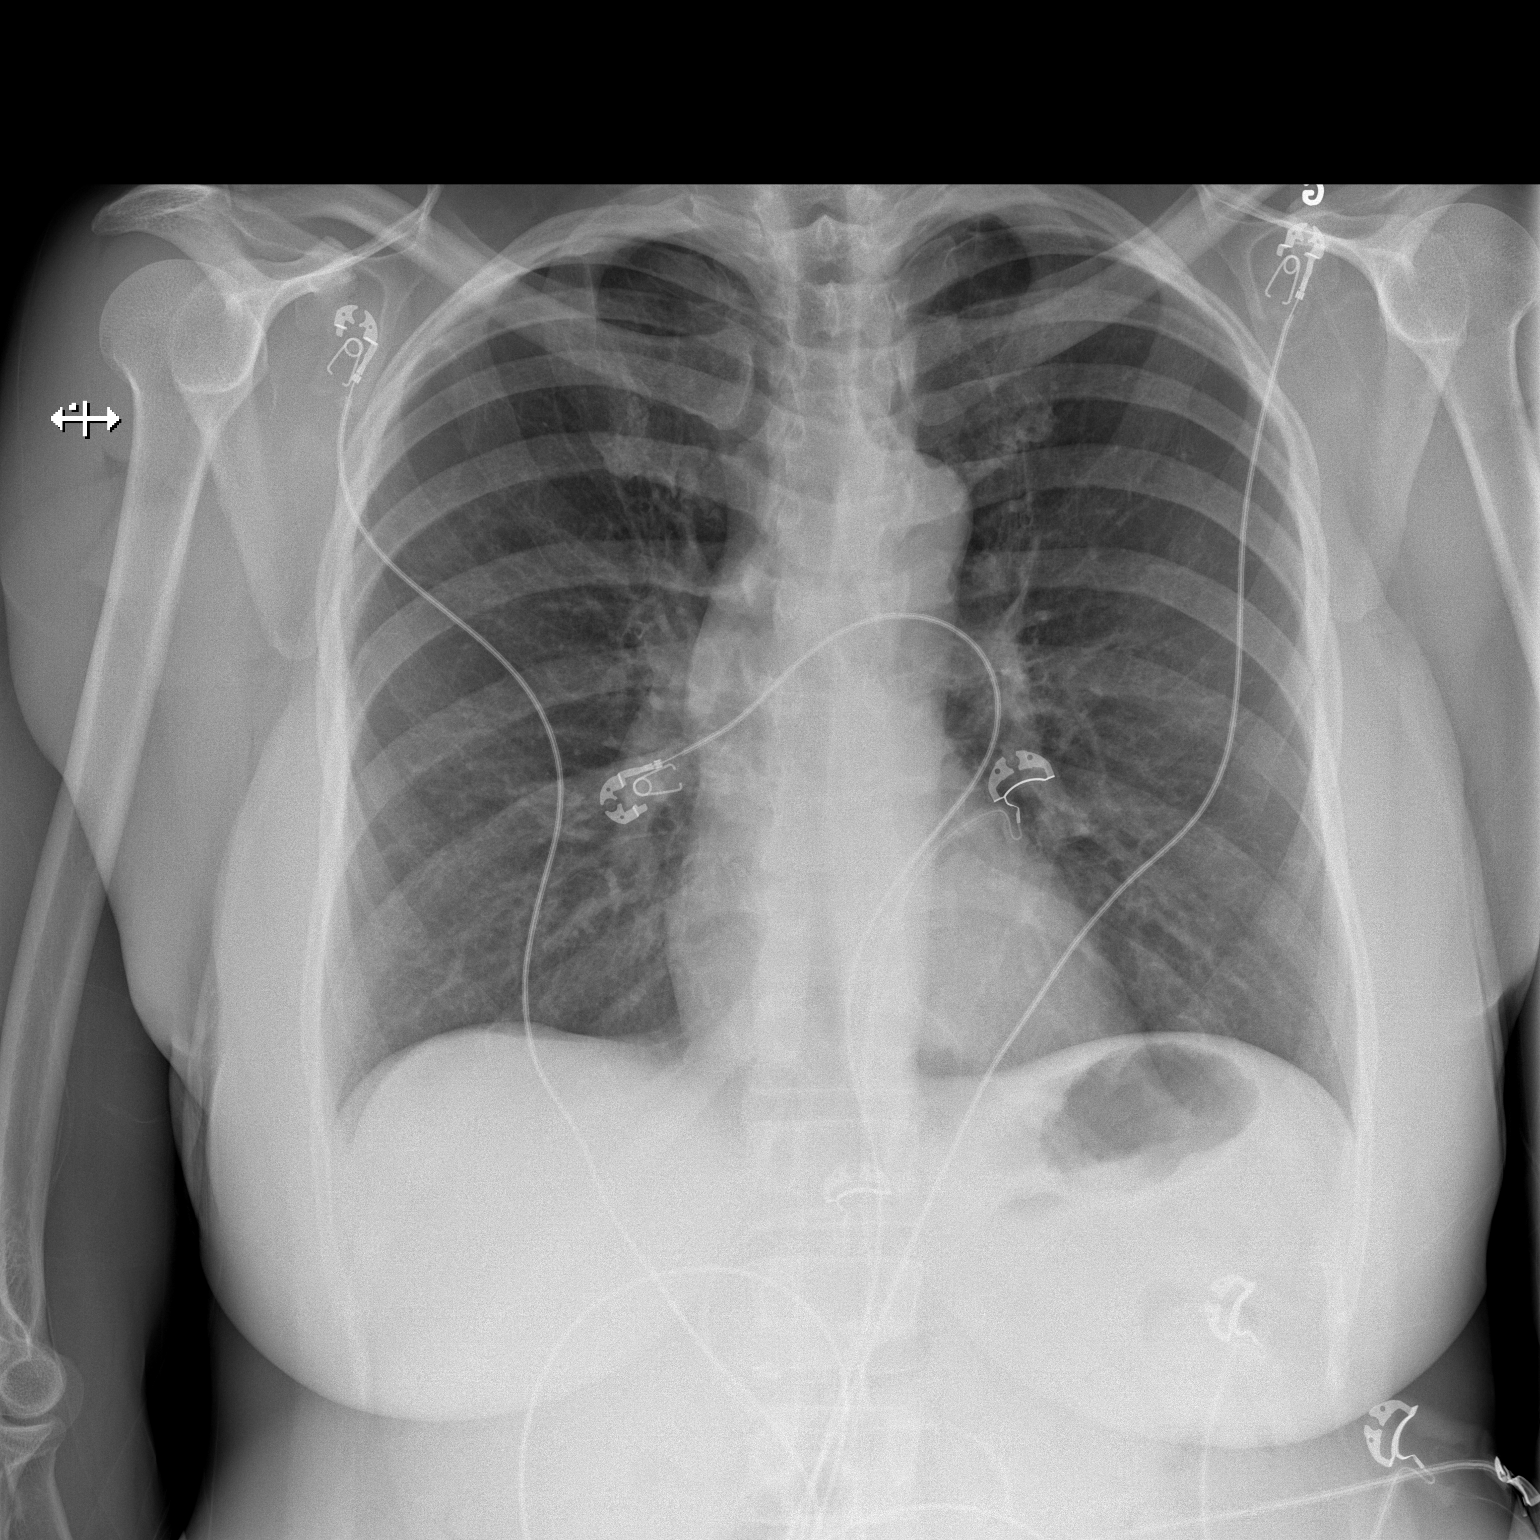

[w chest lat]
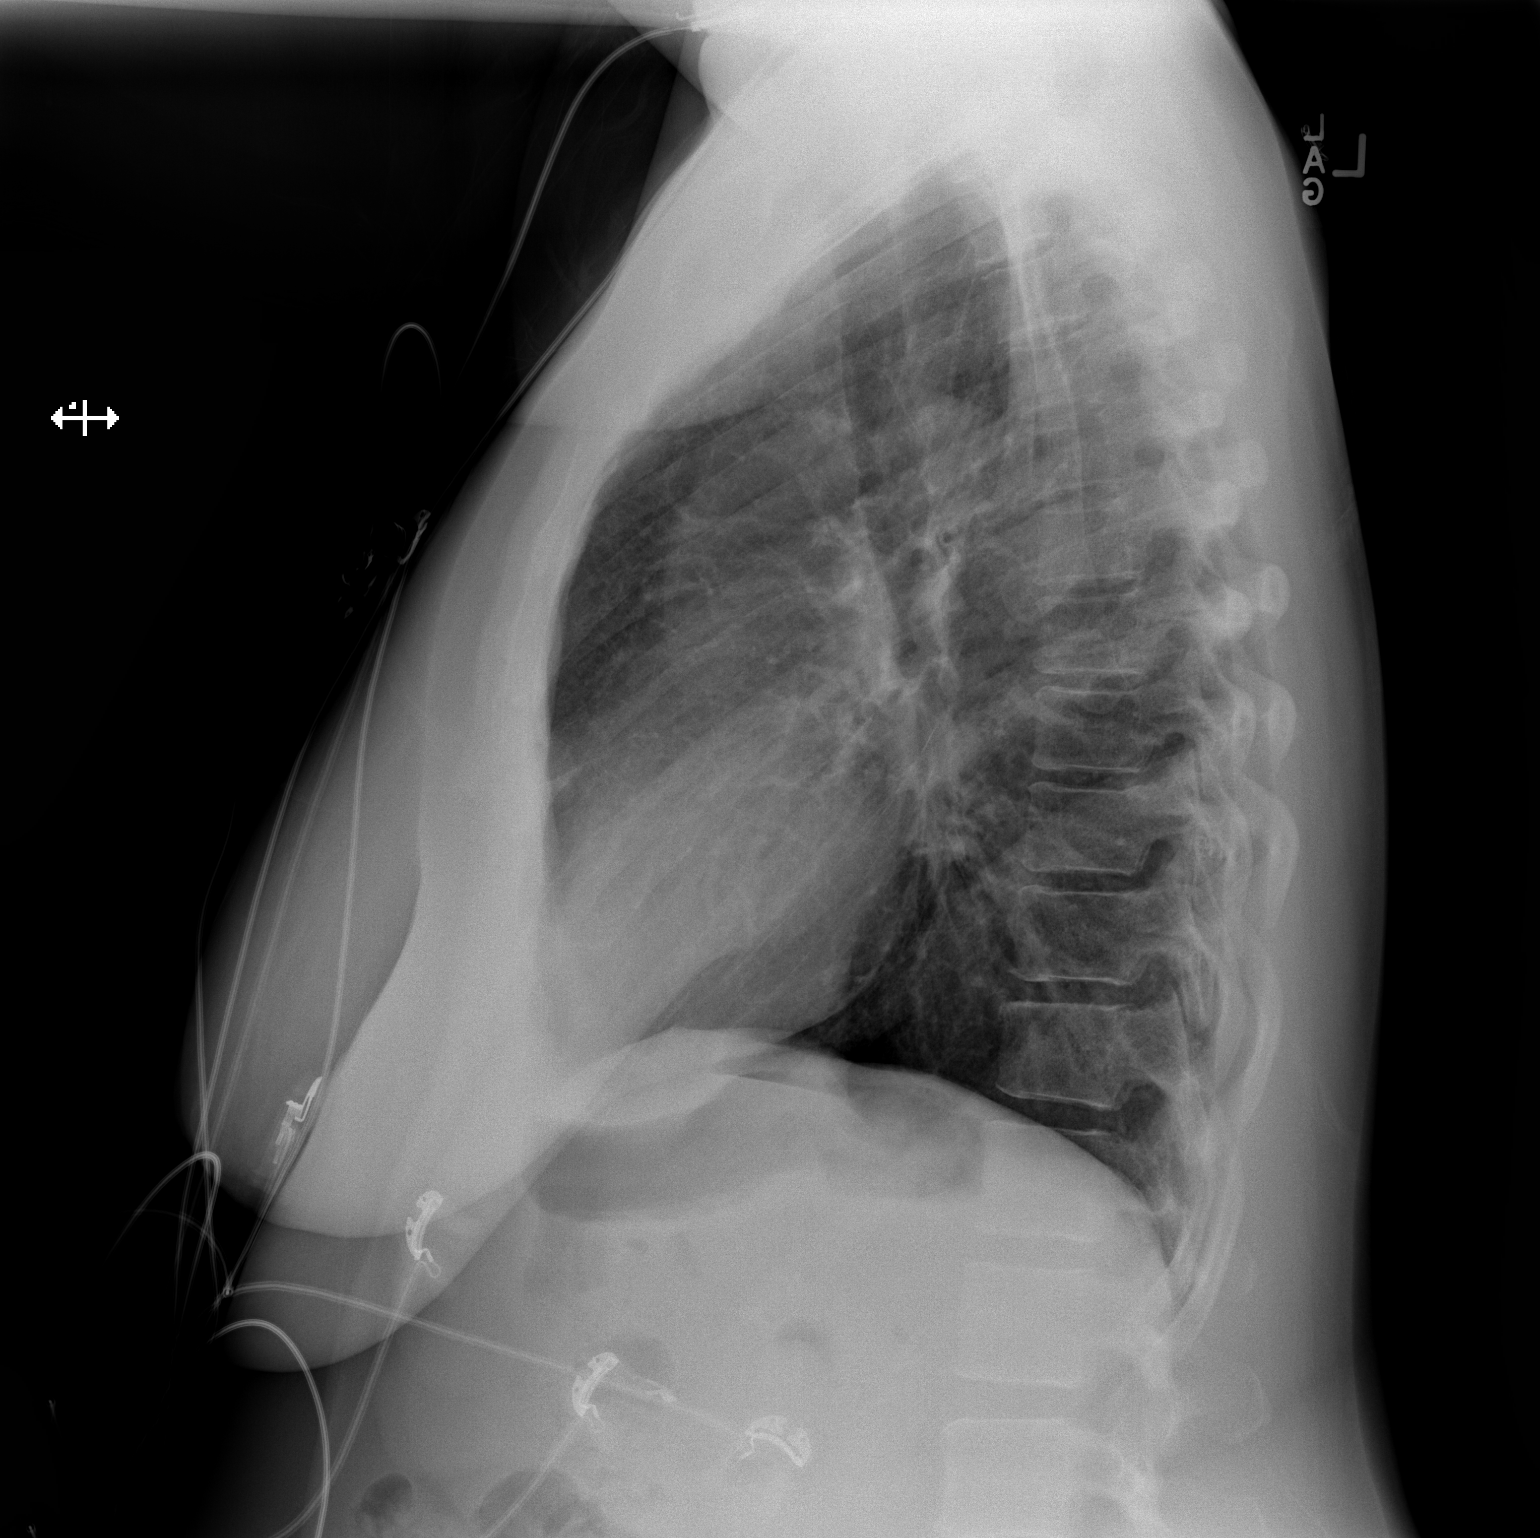

[2 of 2 positions shown; findings below may reference images not displayed]

FINDINGS: The lungs are well-aerated and clear. There is no evidence of focal
opacification, pleural effusion or pneumothorax.

The heart is normal in size; the mediastinal contour is within
normal limits. No acute osseous abnormalities are seen.
IMPRESSION: No acute cardiopulmonary process seen.

## 2013-12-12 MED ORDER — PREDNISONE 20 MG PO TABS
60.0000 mg | ORAL_TABLET | Freq: Once | ORAL | Status: AC
  Administered 2013-12-12: 60 mg via ORAL
  Filled 2013-12-12: qty 3

## 2013-12-12 MED ORDER — ALBUTEROL SULFATE HFA 108 (90 BASE) MCG/ACT IN AERS
2.0000 | INHALATION_SPRAY | RESPIRATORY_TRACT | Status: DC | PRN
  Administered 2013-12-12: 2 via RESPIRATORY_TRACT
  Filled 2013-12-12: qty 6.7

## 2013-12-12 MED ORDER — PREDNISONE 10 MG PO TABS: 20.0000 mg | ORAL_TABLET | Freq: Every day | ORAL | Status: DC

## 2013-12-12 MED ORDER — AEROCHAMBER Z-STAT PLUS/MEDIUM MISC
1.0000 | Freq: Once | Status: AC
  Administered 2013-12-12: 1

## 2013-12-12 MED ORDER — IPRATROPIUM-ALBUTEROL 0.5-2.5 (3) MG/3ML IN SOLN
3.0000 mL | Freq: Once | RESPIRATORY_TRACT | Status: AC
  Administered 2013-12-12: 3 mL via RESPIRATORY_TRACT
  Filled 2013-12-12: qty 3

## 2013-12-12 NOTE — ED Notes (Signed)
Pt states that she has been having increased ShOB since yesterday, reports a cough and "a little bit of chest pain." Pt a&o x4, actively coughing in triage, non-productive, Skin warm and dry.

## 2013-12-12 NOTE — Discharge Instructions (Signed)
Asthma, Acute Bronchospasm °Acute bronchospasm caused by asthma is also referred to as an asthma attack. Bronchospasm means your air passages become narrowed. The narrowing is caused by inflammation and tightening of the muscles in the air tubes (bronchi) in your lungs. This can make it hard to breath or cause you to wheeze and cough. °CAUSES °Possible triggers are: °· Animal dander from the skin, hair, or feathers of animals. °· Dust mites contained in house dust. °· Cockroaches. °· Pollen from trees or grass. °· Mold. °· Cigarette or tobacco smoke. °· Air pollutants such as dust, household cleaners, hair sprays, aerosol sprays, paint fumes, strong chemicals, or strong odors. °· Cold air or weather changes. Cold air may trigger inflammation. Winds increase molds and pollens in the air. °· Strong emotions such as crying or laughing hard. °· Stress. °· Certain medicines such as aspirin or beta-blockers. °· Sulfites in foods and drinks, such as dried fruits and wine. °· Infections or inflammatory conditions, such as a flu, cold, or inflammation of the nasal membranes (rhinitis). °· Gastroesophageal reflux disease (GERD). GERD is a condition where stomach acid backs up into your throat (esophagus). °· Exercise or strenuous activity. °SIGNS AND SYMPTOMS  °· Wheezing. °· Excessive coughing, particularly at night. °· Chest tightness. °· Shortness of breath. °DIAGNOSIS  °Your health care provider will ask you about your medical history and perform a physical exam. A chest X-ray or blood testing may be performed to look for other causes of your symptoms or other conditions that may have triggered your asthma attack.  °TREATMENT  °Treatment is aimed at reducing inflammation and opening up the airways in your lungs.  Most asthma attacks are treated with inhaled medicines. These include quick relief or rescue medicines (such as bronchodilators) and controller medicines (such as inhaled corticosteroids). These medicines are  sometimes given through an inhaler or a nebulizer. Systemic steroid medicine taken by mouth or given through an IV tube also can be used to reduce the inflammation when an attack is moderate or severe. Antibiotic medicines are only used if a bacterial infection is present.  °HOME CARE INSTRUCTIONS  °· Rest. °· Drink plenty of liquids. This helps the mucus to remain thin and be easily coughed up. Only use caffeine in moderation and do not use alcohol until you have recovered from your illness. °· Do not smoke. Avoid being exposed to secondhand smoke. °· You play a critical role in keeping yourself in good health. Avoid exposure to things that cause you to wheeze or to have breathing problems. °· Keep your medicines up to date and available. Carefully follow your health care provider's treatment plan. °· Take your medicine exactly as prescribed. °· When pollen or pollution is bad, keep windows closed and use an air conditioner or go to places with air conditioning. °· Asthma requires careful medical care. See your health care provider for a follow-up as advised. If you are more than [redacted] weeks pregnant and you were prescribed any new medicines, let your obstetrician know about the visit and how you are doing. Follow-up with your health care provider as directed. °· After you have recovered from your asthma attack, make an appointment with your outpatient doctor to talk about ways to reduce the likelihood of future attacks. If you do not have a doctor who manages your asthma, make an appointment with a primary care doctor to discuss your asthma. °SEEK IMMEDIATE MEDICAL CARE IF:  °· You are getting worse. °· You have trouble breathing. If severe, call   your local emergency services (911 in the [place]  You develop chest pain or discomfort.  You are vomiting.  You are not able to keep fluids down.  You are coughing up yellow, green, [place], or bloody sputum.  You have a fever and your symptoms suddenly get  worse.  You have trouble swallowing. MAKE SURE YOU:   Understand these instructions.  Will watch your condition.  Will get help right away if you are not doing well or get worse. Document Released: 12/13/2006 Document Revised: 04/30/2013 Document Reviewed: 03/05/2013 [facility] Patient Information 2014 [place], [place]. Please take your steroid/prednisone at night, use the inhaler provided to, you every 4-6 hours while awake for the next 2 days on a regular basis, then as needed.  Please return anytime, you develop shortness of breath, fever, or worsening symptoms

## 2013-12-12 NOTE — ED Provider Notes (Signed)
Medical screening examination/treatment/procedure(s) were performed by non-physician practitioner and as supervising physician I was immediately available for consultation/collaboration.    Teressa Lower, MD 12/12/13 [phone]

## 2013-12-12 NOTE — ED Provider Notes (Signed)
CSN: [id number]     Arrival date & time 12/12/13  0317 History   First MD Initiated Contact with Patient 12/12/13 0327     Chief Complaint  Patient presents with  . Shortness of Breath     (Consider location/radiation/quality/duration/timing/severity/associated sxs/prior Treatment) HPI Comments: Patient presents tonight with increasing shortness of breath.  Over the past 24 hours.  She does have a history of asthma, but does not have an inhaler.  She was seen by her primary care physician 3 days ago.  For cough.  She was given a prescription for [employer] and Flonase.  She is unsure, if she has had a fever  Patient is a 53 y.o. female presenting with shortness of breath. The history is provided by the patient.  Shortness of Breath Severity:  Moderate Onset quality:  Sudden Timing:  Constant Progression:  Worsening Chronicity:  New Relieved by:  Nothing Worsened by:  Activity Ineffective treatments:  None tried Associated symptoms: chest pain, cough and sore throat   Associated symptoms: no fever, no rash and no wheezing   Cough:    Cough characteristics:  Non-productive   Sputum characteristics:  Clear   Severity:  Moderate   Onset quality:  Gradual   Timing:  Intermittent   Progression:  Worsening   Past Medical History  Diagnosis Date  . Asthma   . Hypertension    History reviewed. No pertinent past surgical history. History reviewed. No pertinent family history. History  Substance Use Topics  . Smoking status: Current Every Day Smoker    Types: Cigarettes  . Smokeless tobacco: Never Used  . Alcohol Use: No   OB History   Grav Para Term Preterm Abortions TAB SAB Ect Mult Living                 Review of Systems  Constitutional: Negative for fever and chills.  HENT: Positive for sore throat.   Respiratory: Positive for cough and shortness of breath. Negative for wheezing.   Cardiovascular: Positive for chest pain. Negative for leg swelling.   Gastrointestinal: Negative for nausea.  Skin: Negative for rash and wound.  All other systems reviewed and are negative.      Allergies  Aspirin  Home Medications   Current Outpatient Rx  Name  Route  Sig  Dispense  Refill  . benzonatate (TESSALON) 200 MG capsule   Oral   Take 200 mg by mouth 2 (two) times daily as needed for cough.          . diclofenac (VOLTAREN) 75 MG EC tablet   Oral   Take 75 mg by mouth 2 (two) times daily.          . fluticasone (FLONASE) 50 MCG/ACT nasal spray   Nasal   Place 1 spray into the nose daily.          Marland Kitchen lisinopril-hydrochlorothiazide (PRINZIDE,ZESTORETIC) 10-12.5 MG per tablet   Oral   Take 1 tablet by mouth daily.          . predniSONE (DELTASONE) 10 MG tablet   Oral   Take 2 tablets (20 mg total) by mouth daily.   10 tablet   0    BP 127/73  Pulse 65  Temp(Src) 98.2 F (36.8 C) (Oral)  Resp 21  SpO2 98%  LMP 04/11/2013 Physical Exam  Nursing note and vitals reviewed. Constitutional: She is oriented to person, place, and time. She appears well-developed and well-nourished.  HENT:  Head: Normocephalic.  Eyes: Pupils  are equal, round, and reactive to light.  Neck: Normal range of motion.  Cardiovascular: Normal rate and regular rhythm.   Pulmonary/Chest: No accessory muscle usage. Tachypnea noted. No respiratory distress. She has no wheezes. She has no rales.  Abdominal: Soft.  Musculoskeletal: Normal range of motion.  Lymphadenopathy:    She has no cervical adenopathy.  Neurological: She is alert and oriented to person, place, and time.  Skin: Skin is warm. Rash noted. No pallor.    ED Course  Procedures (including critical care time) Labs Review Labs Reviewed - No data to display Imaging Review Dg Chest 2 View  12/12/2013   CLINICAL DATA:  Shortness of breath.  EXAM: CHEST  2 VIEW  COMPARISON:  Chest radiograph performed 12/02/2012  FINDINGS: The lungs are well-aerated and clear. There is no evidence of  focal opacification, pleural effusion or pneumothorax.  The heart is normal in size; the mediastinal contour is within normal limits. No acute osseous abnormalities are seen.  IMPRESSION: No acute cardiopulmonary process seen.   Electronically Signed   By: Garald Balding M.D.   On: 12/12/2013 04:34     EKG Interpretation   Date/Time:  Friday December 12 2013 03:28:04 EDT Ventricular Rate:  70 PR Interval:  152 QRS Duration: 79 QT Interval:  400 QTC Calculation: 432 R Axis:   57 Text Interpretation:  Sinus rhythm Nonspecific ST abnormality No old  tracing to compare Confirmed by OPITZ  MD, [place] ([id number]) on 12/12/2013  3:38:57 AM      MDM  She'll do one in patient's x-ray, reviewed.  No pneumonia noted.  She, states she's feeling slightly less short of breath.  She will be started on steroid taper, given an albuterol inhaler.  She received 2 puffs at this time with continued observation  Patient received 2 puffs up appear oral via air, chamber.  She's been able to walk to the bathroom.  She is moving more, air.  On reassessment.  She is feeling much better.  She will be discharged home with the inhaler, prescription for steroids for the next 5 days and with instructions to followup at any time.  She develops new worsening.  Symptoms.  Fever Final diagnoses:  Asthma exacerbation        Garald Balding, NP 12/12/13 [phone]

## 2013-12-12 NOTE — ED Notes (Signed)
RT called at this time, states she will start the neb treatment shortly.

## 2013-12-15 ENCOUNTER — Emergency Department (HOSPITAL_COMMUNITY)
Admission: EM | Admit: 2013-12-15 | Discharge: 2013-12-15 | Disposition: A | Discharge status: Home / Self Care | Payer: 59 | Attending: Emergency Medicine | Admitting: Emergency Medicine

## 2013-12-15 ENCOUNTER — Emergency Department (HOSPITAL_COMMUNITY): Payer: 59

## 2013-12-15 ENCOUNTER — Encounter (HOSPITAL_COMMUNITY): Payer: Self-pay | Admitting: Emergency Medicine

## 2013-12-15 DIAGNOSIS — R0602 Shortness of breath: Secondary | ICD-10-CM | POA: Insufficient documentation

## 2013-12-15 DIAGNOSIS — R059 Cough, unspecified: Secondary | ICD-10-CM | POA: Insufficient documentation

## 2013-12-15 DIAGNOSIS — J45909 Unspecified asthma, uncomplicated: Secondary | ICD-10-CM | POA: Insufficient documentation

## 2013-12-15 DIAGNOSIS — I10 Essential (primary) hypertension: Secondary | ICD-10-CM | POA: Insufficient documentation

## 2013-12-15 DIAGNOSIS — R0789 Other chest pain: Secondary | ICD-10-CM

## 2013-12-15 DIAGNOSIS — R071 Chest pain on breathing: Secondary | ICD-10-CM | POA: Insufficient documentation

## 2013-12-15 DIAGNOSIS — F17200 Nicotine dependence, unspecified, uncomplicated: Secondary | ICD-10-CM | POA: Insufficient documentation

## 2013-12-15 DIAGNOSIS — R05 Cough: Secondary | ICD-10-CM | POA: Insufficient documentation

## 2013-12-15 DIAGNOSIS — Z79899 Other long term (current) drug therapy: Secondary | ICD-10-CM | POA: Insufficient documentation

## 2013-12-15 LAB — CBC
HCT: 32.6 % — ABNORMAL LOW (ref 36.0–46.0)
HEMOGLOBIN: 11.2 g/dL — AB (ref 12.0–15.0)
MCH: 26.4 pg (ref 26.0–34.0)
MCHC: 34.4 g/dL (ref 30.0–36.0)
MCV: 76.9 fL — ABNORMAL LOW (ref 78.0–100.0)
PLATELETS: 435 10*3/uL — AB (ref 150–400)
RBC: 4.24 MIL/uL (ref 3.87–5.11)
RDW: 17.6 % — AB (ref 11.5–15.5)
WBC: 12.8 10*3/uL — ABNORMAL HIGH (ref 4.0–10.5)

## 2013-12-15 LAB — BASIC METABOLIC PANEL
BUN: 14 mg/dL (ref 6–23)
CALCIUM: 9.7 mg/dL (ref 8.4–10.5)
CO2: 22 mEq/L (ref 19–32)
CREATININE: 0.68 mg/dL (ref 0.50–1.10)
Chloride: 103 mEq/L (ref 96–112)
Glucose, Bld: 94 mg/dL (ref 70–99)
Potassium: 3.3 mEq/L — ABNORMAL LOW (ref 3.7–5.3)
Sodium: 140 mEq/L (ref 137–147)

## 2013-12-15 LAB — I-STAT TROPONIN, ED
TROPONIN I, POC: 0.01 ng/mL (ref 0.00–0.08)
Troponin i, poc: 0 ng/mL (ref 0.00–0.08)

## 2013-12-15 IMAGING — CR DG CHEST 2V
2 series · 2 of 2 positions shown · non-contrast
Comparison: Chest radiograph performed [DATE]

CLINICAL DATA: Shortness of breath; history of asthma.

EXAM:
CHEST  2 VIEW

[w chest pa]
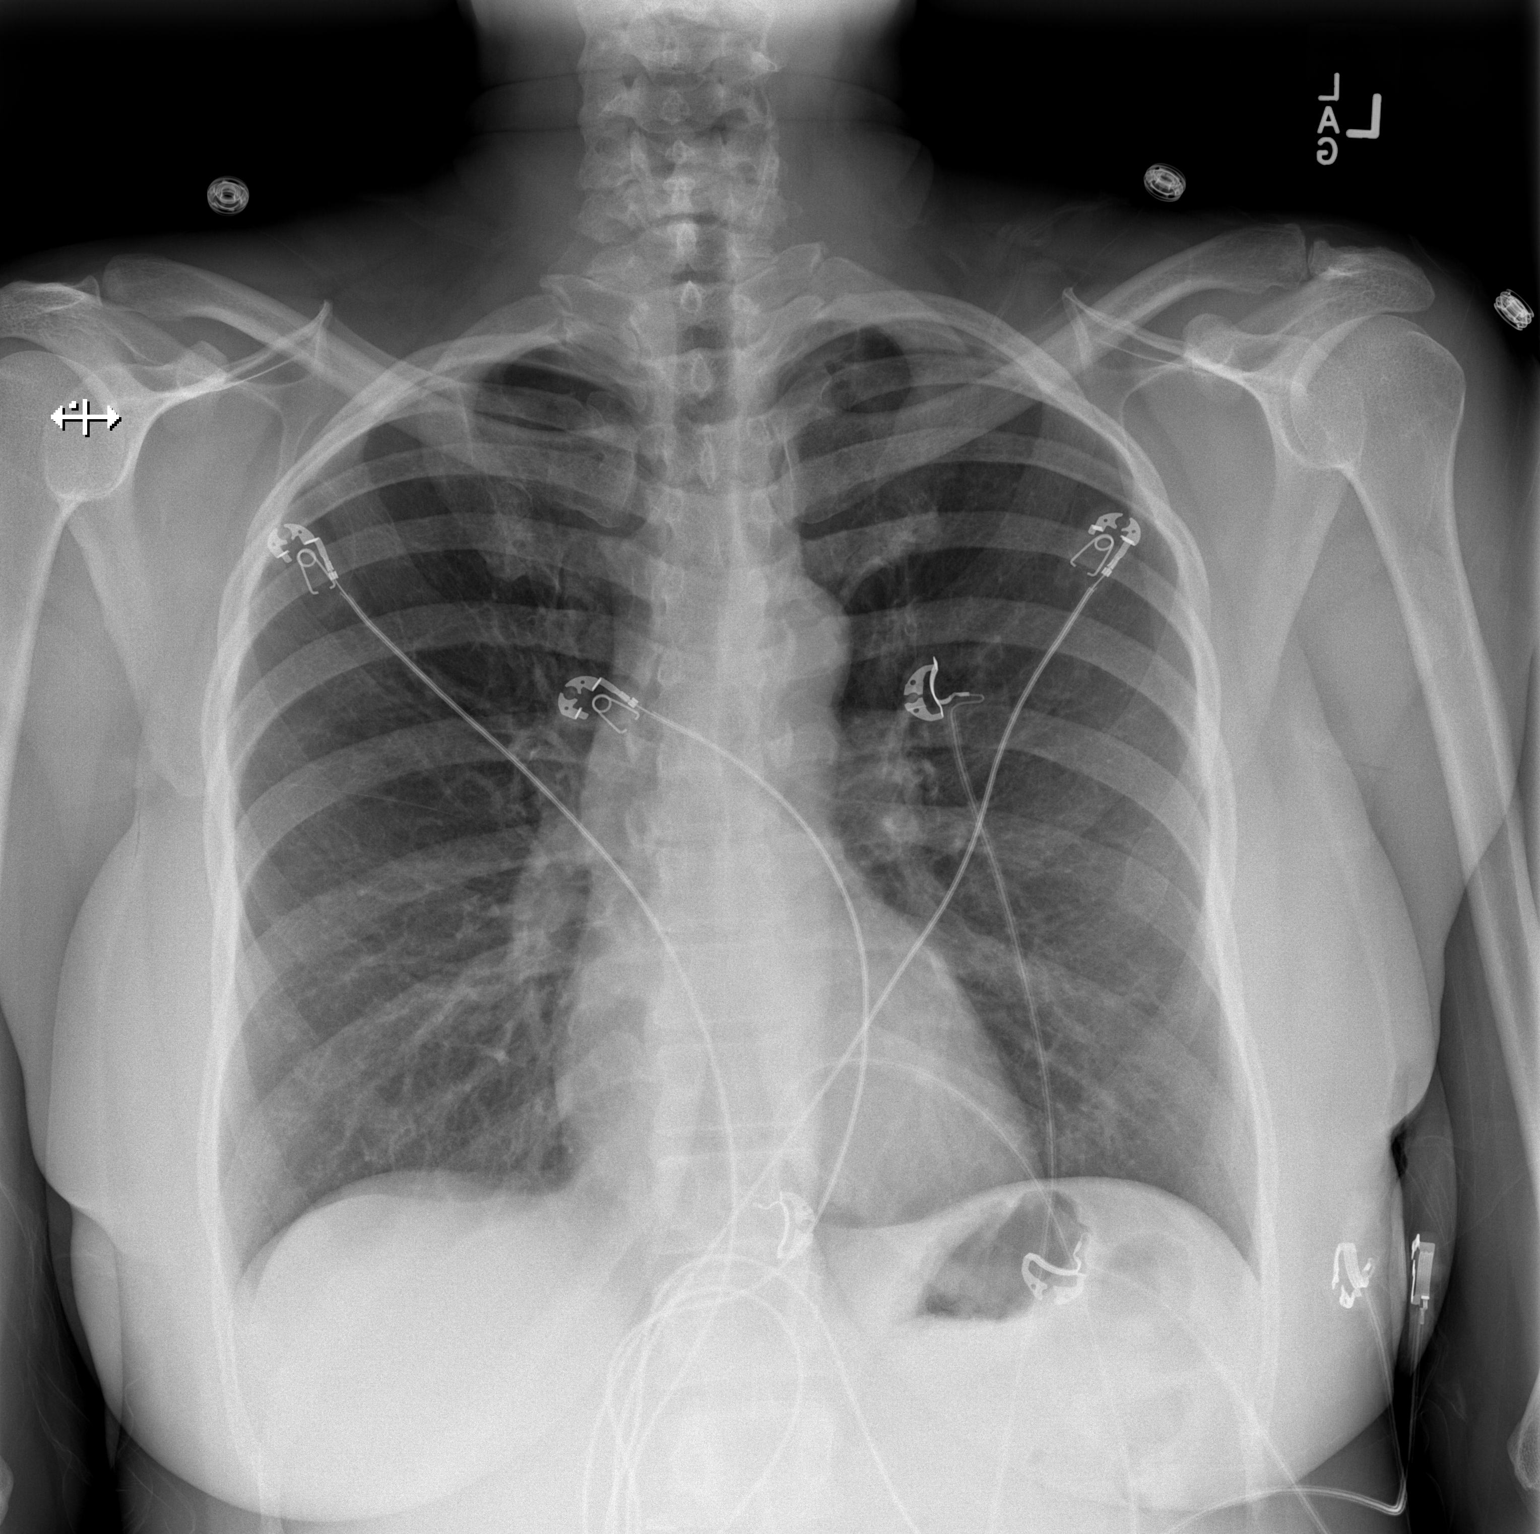

[w chest lat]
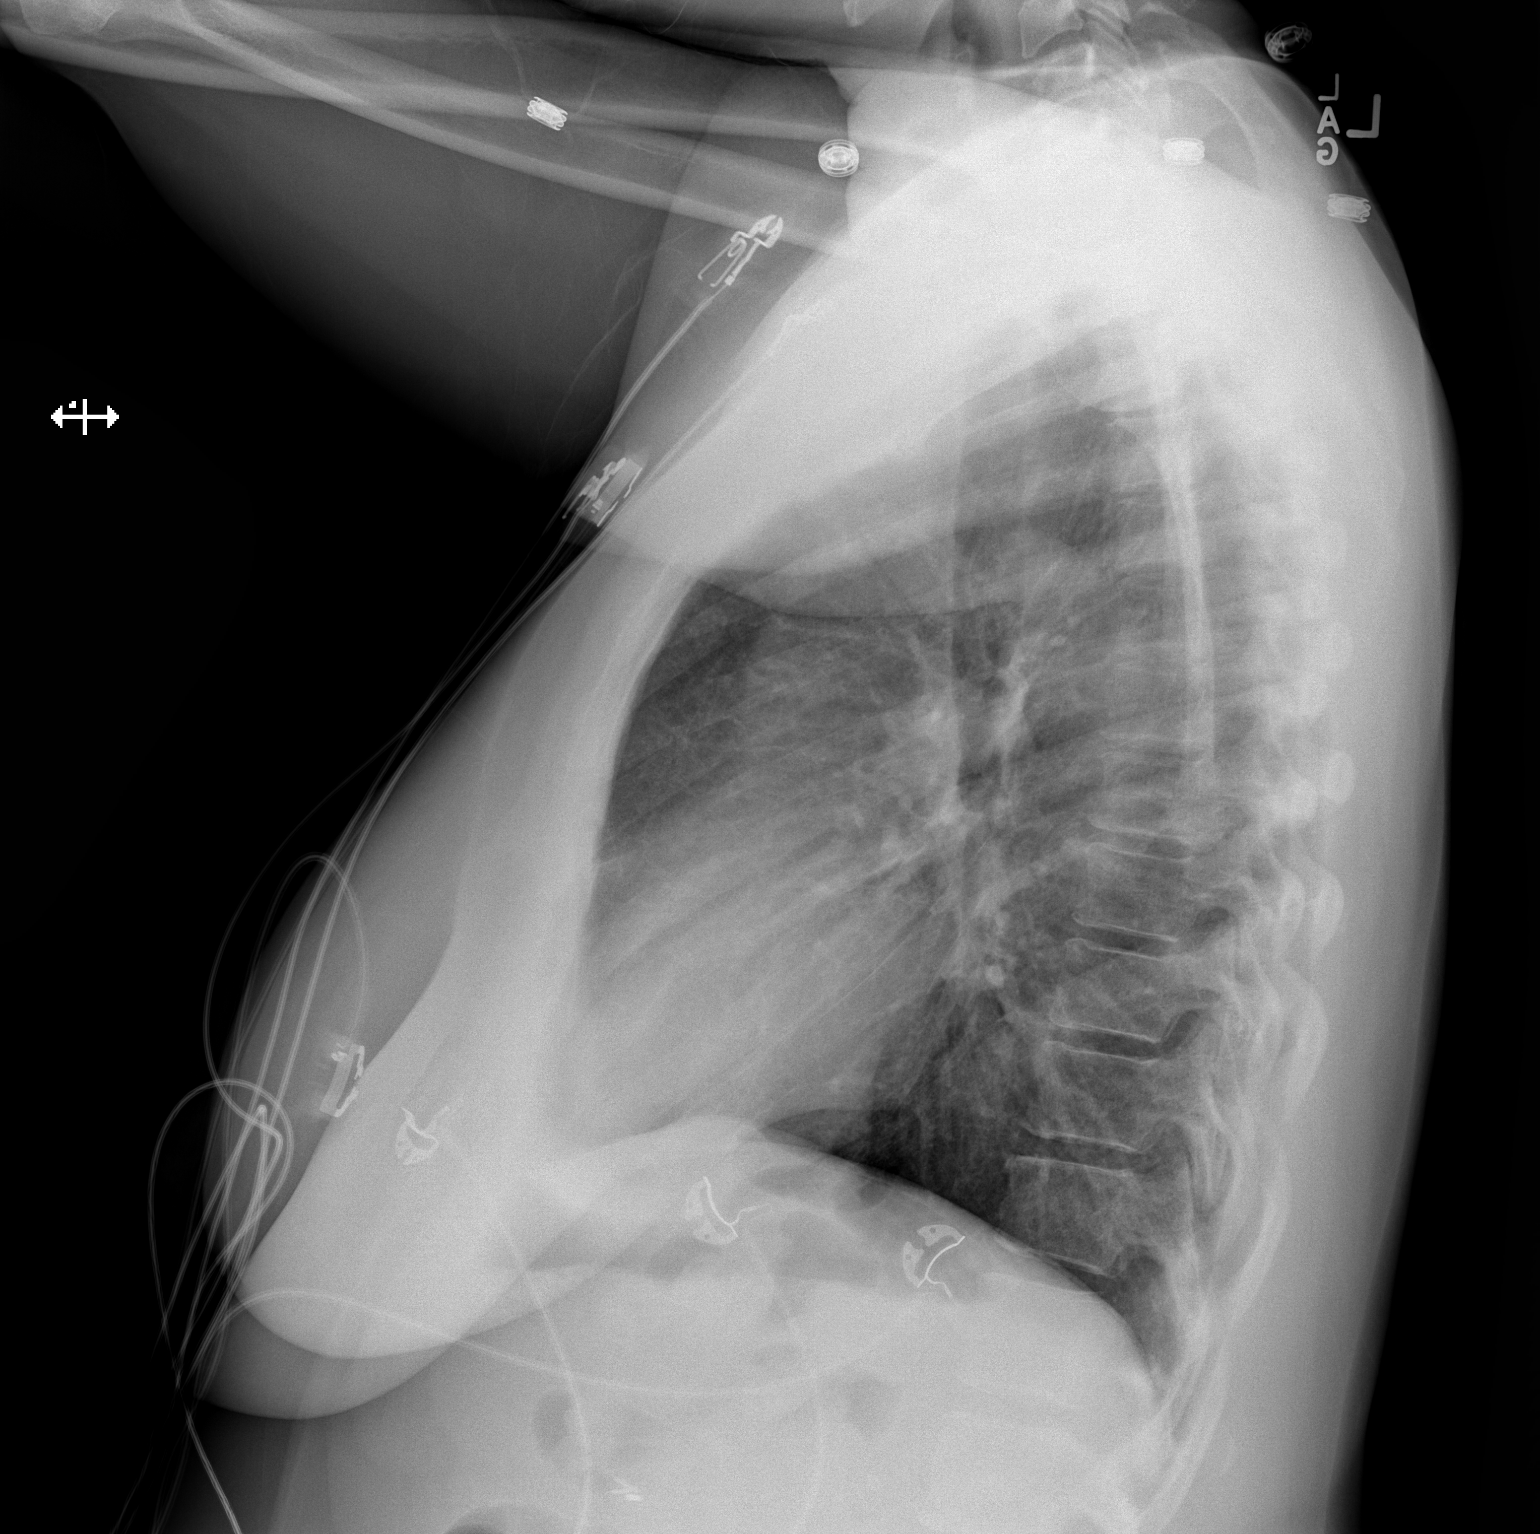

[2 of 2 positions shown; findings below may reference images not displayed]

FINDINGS: The lungs are well-aerated and clear. There is no evidence of focal
opacification, pleural effusion or pneumothorax.

The heart is normal in size; the mediastinal contour is within
normal limits. No acute osseous abnormalities are seen. Clips are
noted within the right upper quadrant, reflecting prior
cholecystectomy.
IMPRESSION: No acute cardiopulmonary process seen.

## 2013-12-15 MED ORDER — PREDNISONE 20 MG PO TABS: 40.0000 mg | ORAL_TABLET | Freq: Every day | ORAL | Status: DC

## 2013-12-15 MED ORDER — ALBUTEROL SULFATE (2.5 MG/3ML) 0.083% IN NEBU: 2.5000 mg | INHALATION_SOLUTION | Freq: Four times a day (QID) | RESPIRATORY_TRACT | Status: DC | PRN

## 2013-12-15 MED ORDER — METHYLPREDNISOLONE SODIUM SUCC 125 MG IJ SOLR
125.0000 mg | Freq: Once | INTRAMUSCULAR | Status: AC
Start: 2013-12-15 — End: 2013-12-15
  Administered 2013-12-15: 125 mg via INTRAVENOUS
  Filled 2013-12-15: qty 2

## 2013-12-15 MED ORDER — IPRATROPIUM BROMIDE 0.02 % IN SOLN
0.5000 mg | Freq: Once | RESPIRATORY_TRACT | Status: AC
  Administered 2013-12-15: 0.5 mg via RESPIRATORY_TRACT
  Filled 2013-12-15: qty 2.5

## 2013-12-15 MED ORDER — POTASSIUM CHLORIDE CRYS ER 20 MEQ PO TBCR
40.0000 meq | EXTENDED_RELEASE_TABLET | Freq: Once | ORAL | Status: AC
  Administered 2013-12-15: 40 meq via ORAL
  Filled 2013-12-15: qty 2

## 2013-12-15 MED ORDER — DOXYCYCLINE HYCLATE 100 MG PO CAPS: 100.0000 mg | ORAL_CAPSULE | Freq: Two times a day (BID) | ORAL | Status: DC

## 2013-12-15 MED ORDER — ALBUTEROL SULFATE (2.5 MG/3ML) 0.083% IN NEBU
5.0000 mg | INHALATION_SOLUTION | Freq: Once | RESPIRATORY_TRACT | Status: AC
  Administered 2013-12-15: 5 mg via RESPIRATORY_TRACT
  Filled 2013-12-15: qty 6

## 2013-12-15 NOTE — ED Notes (Signed)
Pt reports ShOB for the past hour, states that she was sleeping and awoke feeling this way, states this has happened last week as well. Pt reports discomfort when breathing. Pt a&o x4, skin warm and dry, ambulatory to triage.

## 2013-12-15 NOTE — ED Notes (Signed)
Pt escorted to discharge window. Pt verbalized understanding discharge instructions. In no acute distress.  

## 2013-12-15 NOTE — Discharge Instructions (Signed)
Take medications as directed. Follow up with your doctor in 2 days for reevaluation, if you do not have a doctor refer to the resource guide below. Return to emergency department should your shortness of breath or chest pain return/worsen.    Emergency Department Resource Guide 1) Find a Doctor and Pay Out of Pocket Although you won't have to find out who is covered by your insurance plan, it is a good idea to ask around and get recommendations. You will then need to call the office and see if the doctor you have chosen will accept you as a new patient and what types of options they offer for patients who are self-pay. Some doctors offer discounts or will set up payment plans for their patients who do not have insurance, but you will need to ask so you aren't surprised when you get to your appointment.  2) Contact Your Local Health Department Not all health departments have doctors that can see patients for sick visits, but many do, so it is worth a call to see if yours does. If you don't know where your local health department is, you can check in your phone book. The CDC also has a tool to help you locate your state's health department, and many state websites also have listings of all of their local health departments.  3) Find a [facility] If your illness is not likely to be very severe or complicated, you may want to try a walk in clinic. These are popping up all over the country in pharmacies, drugstores, and shopping centers. They're usually staffed by nurse practitioners or physician assistants that have been trained to treat common illnesses and complaints. They're usually fairly quick and inexpensive. However, if you have serious medical issues or chronic medical problems, these are probably not your best option.  No Primary Care Doctor: - Call Health Connect at  [phone] - they can help you locate a primary care doctor that  accepts your insurance, provides certain services,  etc. - Physician Referral Service- [phone]  Chronic Pain Problems: Organization         Address  Phone   Notes  [facility]  [phone] Patients need to be referred by their primary care doctor.   Medication Assistance: Organization         Address  Phone   Notes  [facility] Medication [facility] [address] [place], [place] [address] [phone] --Must be a [facility] of [facility] -- Must have NO insurance coverage whatsoever (no Medicaid/ Medicare, etc.) -- The pt. MUST have a primary care doctor that directs their care regularly and follows them in the community   MedAssist  [phone]   [employer]  [phone]    Agencies that provide inexpensive medical care: Organization         Address  Phone   Notes  [place]  [phone]   [facility]    [phone]   [facility] [place], [place] [address] [phone]   [place] [address], [place] [phone]   Planned Parenthood    [phone]   [facility]    [phone]   [place] [address], [place] Phone:  [phone], Fax:  [phone] Hours of Operation:  9 am - 6 pm, M-F.  Also accepts Medicaid/Medicare  and self-pay.  [facility] for [place] [address], [address], [place] Phone: [phone], Fax: [phone]. Hours of Operation:  8:30 am - 5:30 pm, M-F.  Also accepts Medicaid and self-pay.  [facility] [place] [address], [place] Phone: [phone]   [place], [place], [place] [phone], Ext. [phone] Mondays & Thursdays: 7-9 AM.  First 15 patients are seen on a first come, first serve basis.    [place] Providers:  Organization         Address  Phone   Notes  [facility] [address], [place] [phone] Also accepts self-pay patients.  [facility] [address], [place]  [phone]   [place], [address], [place] [phone]   [facility] [address] [place] [phone]   Lucianne Lei [address], [place]   [phone] Only accepts [place] Access [place] patients after they have their name applied to their card.   Self-Pay (no insurance) in [facility]:  Organization         Address  Phone   Notes  Sickle Cell Patients, [facility] [place] [phone]   [facility] [place] [phone]   [facility] [place]  [place], [place], [place] [phone]   [facility]/Dr. Osei-Bonsu  [address] [place] or [address], [place] [phone] Phone number for both [place] and [place] locations is the same.  [facility] [address] [place] [phone]   [facility] [address] [place] or [address] [phone]   [facility] [place] [address] [place] [phone], phone; [phone], fax Sees patients 1st and 3rd Saturday of every month.  Must not qualify for public or private insurance (i.e. Medicaid, Medicare, [place] Health Choice, Veterans' Benefits)  Household income should be no more than 200% of the poverty level The clinic cannot treat you if you are pregnant or think you are pregnant  Sexually transmitted diseases are not treated at the clinic.    Dental Care: Organization         Address  Phone  Notes  [facility] Department of [facility] [phone] Accepts children up to  age 26 who are enrolled in [place] or [place]; pregnant women with a Medicaid card; and children who have applied for Medicaid or [place] Health Choice, but were declined, whose parents can pay a reduced fee at time of service.  [facility] Department of [facility]  [address], [place] [phone] Accepts children up to age 43 who are enrolled in [place] or [place]; pregnant women with a Medicaid card; and children who have applied for Medicaid or [place] Health Choice, but were declined, whose parents can pay a reduced fee at time of service.  [facility] Access PRO[facility]  [place] [phone] Patients are seen by appointment only. Walk-ins are not accepted. [place] will see patients 55 years of age and older. Monday - Tuesday (8am-5pm) Most Wednesdays (  8:30-5pm) $30 per visit, cash only  [facility] Adult [facility] Access PROGRAM  [address], [facility] [phone] Patients are seen by appointment only. Walk-ins are not accepted. [place] will see patients 72 years of age and older. One Wednesday Evening (Monthly: Volunteer Based).  $30 per visit, cash only  [place]  [phone] for adults; Children under age 45, call Graduate Pediatric Dentistry at [phone]. Children aged 7-14, please call [phone] to request a pediatric application.  Dental services are provided in all areas of dental care including fillings, crowns and bridges, complete and partial dentures, implants, gum treatment, root canals, and extractions. Preventive care is also provided. Treatment is provided to both adults and children. Patients are selected via a lottery and there is often a waiting list.   [facility] [address], [place]  [phone] [email or web address]   [facility] [address] [place], [place] [phone], Ext. [phone] Second and Fourth Thursday of  each month, opens at 6:30 AM; Clinic ends at 9 AM.  Patients are seen on a first-come first-served basis, and a limited number are seen during each clinic.   [facility]  [address] Hillard Danker [place], [place] [phone]   Eligibility Requirements You must have lived in [place], [place], or [place] counties for at least the last three months.   You cannot be eligible for state or federal sponsored [employer], including [employer], [place], or [employer].   You generally cannot be eligible for healthcare insurance through your employer.    How to apply: Eligibility screenings are held every Tuesday and Wednesday afternoon from 1:00 pm until 4:00 pm. You do not need an appointment for the interview!  [facility] [address], [place], [place]   [place]  [place] Department  [place]  [phone]    Behavioral Health Resources in the Community: Intensive Outpatient Programs Organization         Address  Phone  Notes  [place]. [address], [place], [place] [phone]   [facility] Outpatient [address] [place], [place]   ADS: [facility] [address] [place], [place]   [place] [address],  [place], [place] or [phone]   Substance Abuse Resources Organization         Address  Phone  Notes  Alcohol and Drug Services  [phone]   [place]  [phone]   [facility] [place]   Chinita Pester  [phone]   Residential & Outpatient Substance Abuse Program  [phone]   Psychological Services Organization         Address  Phone  Notes  [facility] [place]  [place]  [phone]   [place] [address],  [place] or [phone]    Mobile Crisis Teams Organization         Address  Phone  Notes  [facility], Mobile Crisis Care Unit  [phone]   [facility]  [address] [place], [place]   [place] Levels [address] [place] [phone]    Self-Help/Support Groups Organization         Address  Phone  Notes  [facility]ental [facility]oc. of [place] - variety of support groups  [place] Call for more information  Narcotics Anonymous (NA), [facility] [address], [employer] [place]  2 meetings at this location   Special educational needs teacher         Address  Phone  Notes  [facility]AP Residential Treatment [place],    [place]  [phone]   [facility]  [address], [place] [phone], [place], [place]   [place], [place] [phone] Admissions: 8am-3pm M-F  Incentives Substance [place] [address],    [place], [place] [phone]   [facility] [address] [place], [place], [place]   [facility] [address]  [place], [place]   [facility] - Intensive Outpatient [address], [place], [place]   [facility] ([place].) [place].,  [place], [place] [phone] or [phone]   [facility] (RTS) [address] [place], [place] Accepts Medicaid  [facility] [place] [address],  [place] [phone] Substance Abuse/Addiction Treatment   [facility] Organization         Address  Phone  Notes  [facility]  [phone]   Domenic Schwab, PhD [address] Arlis Porta [place], [place]   [phone] or [phone]   [place], [place] [phone]     [facility] [address], [place], [place] [phone] Insurance/Medicaid/sponsorship through [facility] and Families [address], [address]                                    [place], [place] [phone] [place] [address], [place] [phone]    Dr. Adele Schilder  [phone]   [facility] Dept. 1) [address], [place] 2) [place] 3)  [address], [place] [phone]  [phone]   [place] [phone] or [phone] (After Hours)

## 2013-12-15 NOTE — ED Provider Notes (Signed)
CSN: [id number]     Arrival date & time 12/15/13  0018 History   First MD Initiated Contact with Patient 12/15/13 [phone]     Chief Complaint  Patient presents with  . Shortness of Breath     (Consider location/radiation/quality/duration/timing/severity/associated sxs/prior Treatment) HPI 53 yo female presents  With shortness of breath started this evening at 12am today. Patietn states she was asleep and woke up SOB. Admits to DOE. Chest pain started shortly after dyspnea. Described as sharp 10/10 pain Pain is intermittent and worse when she coughs. Pain lasts 5-10 minutes per episode. Pain does not radiate. Admits to cough x 3 weeks. Yellow sputum production. Denies fever /chills. Current smoker 15 pack year hx.   Well's Criteria: Suspect DVT : (3) No PE most likely: (3) No HR > 100: (1.5) No Immobilization > 3 days or Surgery w/in 4 wks: (1.5) No Hx of DVT/PE: (1.5) No Hemoptysis: (1) No Hx of Cancer with tx w/in 6 mo: (1) No Total: 0  CAD Risk Factors: HTN: Yes Hyperlipidemia: No Cigarette smoking: Yes Diabetes Mellitus: No Family hx of CAD or MI < 70 yo : No Cocaine Use: No Heart Score: ( a score 0-3 are low risk with less than 2% risk of MACE at 6 weeks.) Suspicion: 0 EKG:0 AGE:24 Risk Factors:1 Troponin:0 Total: 2  Past Medical History  Diagnosis Date  . Asthma   . Hypertension    History reviewed. No pertinent past surgical history. History reviewed. No pertinent family history. History  Substance Use Topics  . Smoking status: Current Every Day Smoker    Types: Cigarettes  . Smokeless tobacco: Never Used  . Alcohol Use: No   OB History   Grav Para Term Preterm Abortions TAB SAB Ect Mult Living                 Review of Systems  All other systems reviewed and are negative.     Allergies  Aspirin  Home Medications   Current Outpatient Rx  Name  Route  Sig  Dispense  Refill  . lisinopril-hydrochlorothiazide (PRINZIDE,ZESTORETIC) 10-12.5 MG per  tablet   Oral   Take 1 tablet by mouth daily.          Marland Kitchen albuterol (PROVENTIL) (2.5 MG/3ML) 0.083% nebulizer solution   Nebulization   Take 3 mLs (2.5 mg total) by nebulization every 6 (six) hours as needed for wheezing or shortness of breath.   75 mL   12   . doxycycline (VIBRAMYCIN) 100 MG capsule   Oral   Take 1 capsule (100 mg total) by mouth 2 (two) times daily. One po bid x 7 days   14 capsule   0   . predniSONE (DELTASONE) 20 MG tablet   Oral   Take 2 tablets (40 mg total) by mouth daily. 5 day therapy course patient began on 12/12/2013   8 tablet   0    BP 130/73  Pulse 69  Temp(Src) 98 F (36.7 C) (Oral)  Resp 16  SpO2 96%  LMP 04/11/2013 Physical Exam  Nursing note and vitals reviewed. Constitutional: She is oriented to person, place, and time. She appears well-developed and well-nourished. No distress.  HENT:  Head: Normocephalic and atraumatic.  Right Ear: Tympanic membrane and ear canal normal.  Left Ear: Tympanic membrane and ear canal normal.  Nose: Nose normal. Right sinus exhibits no maxillary sinus tenderness and no frontal sinus tenderness. Left sinus exhibits no maxillary sinus tenderness and no frontal sinus tenderness.  Mouth/Throat: Uvula is midline, oropharynx is clear and moist and mucous membranes are normal. No oropharyngeal exudate, posterior oropharyngeal edema or posterior oropharyngeal erythema.  Eyes: Conjunctivae are normal. Right eye exhibits no discharge. Left eye exhibits no discharge. No scleral icterus.  Neck: Phonation normal. Neck supple. No JVD present. No rigidity. No tracheal deviation, no edema and no erythema present.  Cardiovascular: Normal rate and regular rhythm.  Exam reveals no gallop and no friction rub.   No murmur heard. Pulmonary/Chest: Effort normal. No stridor. No respiratory distress. She has no wheezes. She has no rhonchi. She has no rales.  Musculoskeletal: She exhibits no edema.  Lymphadenopathy:    She has no  cervical adenopathy.  Neurological: She is alert and oriented to person, place, and time.  Skin: Skin is warm and dry. She is not diaphoretic.  Psychiatric: She has a normal mood and affect. Her behavior is normal.    ED Course  Procedures (including critical care time) Labs Review Labs Reviewed  BASIC METABOLIC PANEL - Abnormal; Notable for the following:    Potassium 3.3 (*)    All other components within normal limits  CBC - Abnormal; Notable for the following:    WBC 12.8 (*)    Hemoglobin 11.2 (*)    HCT 32.6 (*)    MCV 76.9 (*)    RDW 17.6 (*)    Platelets 435 (*)    All other components within normal limits  I-STAT TROPOININ, ED  I-STAT TROPOININ, ED   Imaging Review No results found.   EKG Interpretation   Date/Time:  Monday December 15 2013 01:17:05 EDT Ventricular Rate:  70 PR Interval:  144 QRS Duration: 85 QT Interval:  403 QTC Calculation: 435 R Axis:   65 Text Interpretation:  Sinus rhythm Confirmed by Kathrynn Humble, MD, Thelma Comp  [phone]) on 12/15/2013 5:51:04 AM      MDM   Final diagnoses:  Shortness of breath  Chest wall pain  Patient afebrile with normal VS.  Hypokalemia at 3.3, treated with PO K+ Mild leukocytosis at 12.8 CXR negative Heart Score = 2.  Delta troponin negative. Doubt ACS.  Patient is low risk for PE by Well's Criteria. PE unlikely. CXR shows no evidence of widened mediastinum, pneumoperitoneum, or pneumomediastinum. Doubt aortic dissection or esophageal rupture. no Cardiomegaly on CXR and no evidence of low voltage on EKG. Doubt Pericardial tamponade. Patient given breathing treatment and IV solumedrol. Patient feeling better after treatment in ED. Plan to discharged home with albuterol nebulizer solution, Doxy, and Prednisone burst. Plan for follow up in 2 days with PCP. Resource guide provided. Discharged in good condition.    Meds given in ED:  Medications  albuterol (PROVENTIL) (2.5 MG/3ML) 0.083% nebulizer solution 5 mg (5 mg  Nebulization Given 12/15/13 0150)  potassium chloride SA (K-DUR,KLOR-CON) CR tablet 40 mEq (40 mEq Oral Given 12/15/13 0615)  albuterol (PROVENTIL) (2.5 MG/3ML) 0.083% nebulizer solution 5 mg (5 mg Nebulization Given 12/15/13 0700)  ipratropium (ATROVENT) nebulizer solution 0.5 mg (0.5 mg Nebulization Given 12/15/13 0700)  methylPREDNISolone sodium succinate (SOLU-MEDROL) 125 mg/2 mL injection 125 mg (125 mg Intravenous Given 12/15/13 8469)    Discharge Medication List as of 12/15/2013  7:39 AM    START taking these medications   Details  albuterol (PROVENTIL) (2.5 MG/3ML) 0.083% nebulizer solution Take 3 mLs (2.5 mg total) by nebulization every 6 (six) hours as needed for wheezing or shortness of breath., Starting 12/15/2013, Until Discontinued, Print    doxycycline (VIBRAMYCIN) 100 MG capsule  Take 1 capsule (100 mg total) by mouth 2 (two) times daily. One po bid x 7 days, Starting 12/15/2013, Until Discontinued, [place], [place] 12/17/13 [phone]

## 2013-12-18 NOTE — ED Provider Notes (Signed)
Medical screening examination/treatment/procedure(s) were performed by non-physician practitioner and as supervising physician I was immediately available for consultation/collaboration.   EKG Interpretation   Date/Time:  Monday December 15 2013 01:17:05 EDT Ventricular Rate:  70 PR Interval:  144 QRS Duration: 85 QT Interval:  403 QTC Calculation: 435 R Axis:   65 Text Interpretation:  Sinus rhythm Confirmed by Kathrynn Humble, MD, Raymie Giammarco  ([id number]) on 12/15/2013 5:51:04 AM       Varney Biles, MD 12/18/13 [phone]

## 2014-06-03 ENCOUNTER — Emergency Department (HOSPITAL_COMMUNITY)
Admission: EM | Admit: 2014-06-03 | Discharge: 2014-06-03 | Disposition: A | Discharge status: Home / Self Care | Payer: 59 | Attending: Emergency Medicine | Admitting: Emergency Medicine

## 2014-06-03 DIAGNOSIS — F17200 Nicotine dependence, unspecified, uncomplicated: Secondary | ICD-10-CM | POA: Diagnosis not present

## 2014-06-03 DIAGNOSIS — Y9289 Other specified places as the place of occurrence of the external cause: Secondary | ICD-10-CM | POA: Insufficient documentation

## 2014-06-03 DIAGNOSIS — W57XXXA Bitten or stung by nonvenomous insect and other nonvenomous arthropods, initial encounter: Secondary | ICD-10-CM | POA: Diagnosis present

## 2014-06-03 DIAGNOSIS — Z79899 Other long term (current) drug therapy: Secondary | ICD-10-CM | POA: Diagnosis not present

## 2014-06-03 DIAGNOSIS — J45909 Unspecified asthma, uncomplicated: Secondary | ICD-10-CM | POA: Diagnosis not present

## 2014-06-03 DIAGNOSIS — S40269A Insect bite (nonvenomous) of unspecified shoulder, initial encounter: Secondary | ICD-10-CM | POA: Insufficient documentation

## 2014-06-03 DIAGNOSIS — I10 Essential (primary) hypertension: Secondary | ICD-10-CM | POA: Insufficient documentation

## 2014-06-03 DIAGNOSIS — Y9389 Activity, other specified: Secondary | ICD-10-CM | POA: Diagnosis not present

## 2014-06-03 DIAGNOSIS — Z792 Long term (current) use of antibiotics: Secondary | ICD-10-CM | POA: Diagnosis not present

## 2014-06-03 MED ORDER — DIPHENHYDRAMINE HCL 25 MG PO TABS: 25.0000 mg | ORAL_TABLET | Freq: Three times a day (TID) | ORAL | Status: DC | PRN

## 2014-06-03 NOTE — ED Notes (Signed)
PA at bedside.

## 2014-06-03 NOTE — ED Notes (Signed)
Pt reports she was bitten by bed bugs at work yesterday. Reports pain and itching in her left shoulder area. Pain 8/10

## 2014-06-03 NOTE — Discharge Instructions (Signed)
Bedbugs °Bedbugs are tiny bugs that live in and around beds. During the day, they hide in mattresses and other places near beds. They come out at night and bite people lying in bed. They need blood to live and grow. Bedbugs can be found in beds anywhere. Usually, they are found in places where many people come and go (hotels, shelters, hospitals). It does not matter whether the place is dirty or clean. °Getting bitten by bedbugs rarely causes a medical problem. The biggest problem can be getting rid of them.  This often takes the work of a pest control expert. °CAUSES °· Less use of pesticides. Bedbugs were common before the 1950s. Then, strong pesticides such as DDT nearly wiped them out. Today, these pesticides are not used because they harm the environment and can cause health problems. °· More travel. Besides mattresses, bedbugs can also live in clothing and luggage. They can come along as people travel from place to place. Bedbugs are more common in certain parts of the world. When people travel to those areas, the bugs can come home with them. °· Presence of birds and bats. Bedbugs often infest birds and bats. If you have these animals in or near your home, bedbugs may infest your house, too. °SYMPTOMS °It does not hurt to be bitten by a bedbug. You will probably not wake up when you are bitten. Bedbugs usually bite areas of the skin that are not covered. Symptoms may show when you wake up, or they may take a day or more to show up. Symptoms may include: °· Small red bumps on the skin. These might be lined up in a row or clustered in a group. °· A darker red dot in the middle of red bumps. °· Blisters on the skin. There may be swelling and very bad itching. These may be signs of an allergic reaction. This does not happen often. °DIAGNOSIS °Bedbug bites might look and feel like other types of insect bites. The bugs do not stay on the body like ticks or lice. They bite, drop off, and crawl away to hide. Your  caregiver will probably: °· Ask about your symptoms. °· Ask about your recent activities and travel. °· Check your skin for bedbug bites. °· Ask you to check at home for signs of bedbugs. You should look for: °¨ Spots or stains on the bed or nearby. This could be from bedbugs that were crushed or from their eggs or waste. °¨ Bedbugs themselves. They are reddish-Lukas, oval, and flat. They do not fly. They are about the size of an apple seed. °· Places to look for bedbugs include: °¨ Beds. Check mattresses, headboards, box springs, and bed frames. °¨ On drapes and curtains near the bed. °¨ Under carpeting in the bedroom. °¨ Behind electrical outlets. °¨ Behind any wallpaper that is peeling. °¨ Inside luggage. °TREATMENT °Most bedbug bites do not need treatment. They usually go away on their own in a few days. The bites are not dangerous. However, treatment may be needed if you have scratched so much that your skin has become infected. You may also need treatment if you are allergic to bedbug bites. Treatment options include: °· A drug that stops swelling and itching (corticosteroid). Usually, a cream is rubbed on the skin. If you have a bad rash, you may be given a corticosteroid pill. °· Oral antihistamines. These are pills to help control itching. °· Antibiotic medicines. An antibiotic may be prescribed for infected skin. °HOME CARE INSTRUCTIONS  °·   given a corticosteroid pill.  · Oral antihistamines. These are pills to help control itching.  · Antibiotic medicines. An antibiotic may be prescribed for infected skin.  HOME CARE INSTRUCTIONS   · Take any medicine prescribed by your caregiver for your bites. Follow the directions carefully.  · Consider wearing pajamas with long sleeves and pant legs.  · Your bedroom may need to be treated. A pest control expert should make sure the bedbugs are gone. You may need to throw away mattresses or luggage. Ask the pest control expert what you can do to keep the bedbugs from coming back. Common suggestions include:  ¨ Putting a plastic cover over your mattress.  ¨ Washing and drying your clothes and bedding in hot water and a hot dryer. The temperature should be hotter  than 120° F (48.9° C). Bedbugs are killed by high temperatures.  ¨ Vacuuming carefully all around your bed. Vacuum in all cracks and crevices where the bugs might hide. Do this often.  ¨ Carefully checking all used furniture, bedding, or clothes that you bring into your house.  ¨ Eliminating bird nests and bat roosts.  · If you get bedbug bites when traveling, check all your possessions carefully before bringing them into your house. If you find any bugs on clothes or in your luggage, consider throwing those items away.  SEEK MEDICAL CARE IF:  · You have red bug bites that keep coming back.  · You have red bug bites that itch badly.  · You have bug bites that cause a skin rash.  · You have scratch marks that are red and sore.  SEEK IMMEDIATE MEDICAL CARE IF:  You have a fever.  Document Released: 09/30/2010 Document Revised: 11/20/2011 Document Reviewed: 09/30/2010  [employer]® Patient Information ©2015 [employer]. This information is not intended to replace advice given to you by your health care provider. Make sure you discuss any questions you have with your health care provider.

## 2014-06-03 NOTE — ED Notes (Signed)
Pt escorted to discharge window. Pt verbalized understanding discharge instructions. In no acute distress.  

## 2014-06-03 NOTE — ED Notes (Signed)
Pt alert and oriented x4. Respirations even and unlabored, bilateral symmetrical rise and fall of chest. Skin warm and dry. In no acute distress. Denies needs.   

## 2014-06-03 NOTE — ED Provider Notes (Signed)
Medical screening examination/treatment/procedure(s) were performed by non-physician practitioner and as supervising physician I was immediately available for consultation/collaboration.   EKG Interpretation None        Delice Bison Tharon Kitch, DO 06/03/14 1543

## 2014-06-03 NOTE — ED Provider Notes (Signed)
CSN: [id number]     Arrival date & time 06/03/14  0753 History   First MD Initiated Contact with Patient 06/03/14 [phone]     Chief Complaint  Patient presents with  . bed bugs      (Consider location/radiation/quality/duration/timing/severity/associated sxs/prior Treatment) HPI  53 year old female presents for evaluation of "bed bug bite".  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 For the past week there has been an infestation of bed bugs at the facility. She reported having itchiness in bite to her left upper arm which she notices last night. Complaining of itching and tenderness to the affected location. Denies any fever, nausea vomiting, trouble breathing, or numbness. Denies any recent trauma. Patient has no other complaint. Patient states she does not know what to do with bed bug bites and decided to come to ER for further evaluation.  Past Medical History  Diagnosis Date  . Asthma   . Hypertension    No past surgical history on file. No family history on file. History  Substance Use Topics  . Smoking status: Current Every Day Smoker    Types: Cigarettes  . Smokeless tobacco: Never Used  . Alcohol Use: No   OB History   Grav Para Term Preterm Abortions TAB SAB Ect Mult Living                 Review of Systems  Constitutional: Negative for fever.  Skin: Positive for rash.      Allergies  Aspirin  Home Medications   Prior to Admission medications   Medication Sig Start Date End Date Taking? Authorizing Provider  albuterol (PROVENTIL) (2.5 MG/3ML) 0.083% nebulizer solution Take 3 mLs (2.5 mg total) by nebulization every 6 (six) hours as needed for wheezing or shortness of breath. 12/15/13   Sherrie George, PA-C  doxycycline (VIBRAMYCIN) 100 MG capsule Take 1 capsule (100 mg total) by mouth 2 (two) times daily. One po bid x 7 days 12/15/13   Sherrie George, PA-C  lisinopril-hydrochlorothiazide (PRINZIDE,ZESTORETIC) 10-12.5 MG per tablet Take 1 tablet by mouth  daily.  11/25/13   Historical Provider, MD  predniSONE (DELTASONE) 20 MG tablet Take 2 tablets (40 mg total) by mouth daily. 5 day therapy course patient began on 12/12/2013 12/15/13   Sherrie George, PA-C   BP 192/96  Pulse 63  Temp(Src) 97.8 F (36.6 C) (Oral)  Resp 16  SpO2 94%  LMP 04/11/2013 Physical Exam  Nursing note and vitals reviewed. Constitutional: She appears well-developed and well-nourished. No distress.  HENT:  Head: Atraumatic.  Eyes: Conjunctivae are normal.  Neck: Neck supple.  Cardiovascular: Normal rate and regular rhythm.   Pulmonary/Chest: Effort normal and breath sounds normal.  Neurological: She is alert.  Skin: No rash noted.  L upper arm: a dime size ecchymosis noted to mid lateral arm, mildly tender. No warmth, or erythema noted.  No induration or fluctuance.    No petechiae, pustular, or vesicular lesions.    Psychiatric: She has a normal mood and affect.    ED Course  Procedures (including critical care time)  8:49 AM Patient here with concerns for her but it. She does have a small ecchymosis noted to the left upper arm but no evidence of infection. No other systemic findings noted. No other rash noted. At this time recommend patient to take Benadryl as needed for itch, can use Neosporin over-the-counter as needed to prevent infection. Patient made aware to return if she has signs of infection including fever,  worsening rash. Recommend washing all clothes in hot water. Patient requests a work note. Note given.    Labs Review Labs Reviewed - No data to display  Imaging Review No results found.   EKG Interpretation None      MDM   Final diagnoses:  Insect bite    BP 192/96  Pulse 63  Temp(Src) 97.8 F (36.6 C) (Oral)  Resp 16  SpO2 94%  LMP 04/11/2013 Pt made aware BP is high, and need to be recheck by PCP.  Pt has asymptomatic HTN.    Domenic Moras, PA-C 06/03/14 [phone]

## 2014-09-05 ENCOUNTER — Encounter (HOSPITAL_COMMUNITY): Payer: Self-pay | Admitting: Emergency Medicine

## 2014-09-05 ENCOUNTER — Emergency Department (HOSPITAL_COMMUNITY)
Admission: EM | Admit: 2014-09-05 | Discharge: 2014-09-05 | Disposition: A | Discharge status: Home / Self Care | Payer: 59 | Attending: Emergency Medicine | Admitting: Emergency Medicine

## 2014-09-05 DIAGNOSIS — I10 Essential (primary) hypertension: Secondary | ICD-10-CM | POA: Insufficient documentation

## 2014-09-05 DIAGNOSIS — Z7952 Long term (current) use of systemic steroids: Secondary | ICD-10-CM | POA: Diagnosis not present

## 2014-09-05 DIAGNOSIS — Z79899 Other long term (current) drug therapy: Secondary | ICD-10-CM | POA: Insufficient documentation

## 2014-09-05 DIAGNOSIS — M25512 Pain in left shoulder: Secondary | ICD-10-CM | POA: Insufficient documentation

## 2014-09-05 DIAGNOSIS — J45909 Unspecified asthma, uncomplicated: Secondary | ICD-10-CM | POA: Diagnosis not present

## 2014-09-05 DIAGNOSIS — Z792 Long term (current) use of antibiotics: Secondary | ICD-10-CM | POA: Diagnosis not present

## 2014-09-05 DIAGNOSIS — Z720 Tobacco use: Secondary | ICD-10-CM | POA: Diagnosis not present

## 2014-09-05 DIAGNOSIS — Z7951 Long term (current) use of inhaled steroids: Secondary | ICD-10-CM | POA: Diagnosis not present

## 2014-09-05 DIAGNOSIS — M79602 Pain in left arm: Secondary | ICD-10-CM

## 2014-09-05 MED ORDER — OXYCODONE-ACETAMINOPHEN 5-325 MG PO TABS
1.0000 | ORAL_TABLET | Freq: Once | ORAL | Status: AC
  Administered 2014-09-05: 1 via ORAL
  Filled 2014-09-05: qty 1

## 2014-09-05 MED ORDER — IBUPROFEN 600 MG PO TABS: 600.0000 mg | ORAL_TABLET | Freq: Three times a day (TID) | ORAL | Status: DC | PRN

## 2014-09-05 MED ORDER — OXYCODONE-ACETAMINOPHEN 5-325 MG PO TABS: 1.0000 | ORAL_TABLET | ORAL | Status: DC | PRN

## 2014-09-05 NOTE — ED Notes (Signed)
Pt in with complaints of left arm pain for 2 weeks.

## 2014-09-05 NOTE — ED Notes (Signed)
MD at bedside. 

## 2014-09-05 NOTE — ED Provider Notes (Signed)
CSN: [id number]     Arrival date & time 09/05/14  0915 History   First MD Initiated Contact with Patient 09/05/14 [phone]     Chief Complaint  Patient presents with  . Arm Pain     HPI Patient reports 2 weeks of left shoulder pain is worse with range of motion.  She also reports some mild pain up in her left trapezius region and pain shooting down her left arm.  No numbness or weakness of her left arm.  She denies fevers and chills.  No prior history of shoulder issues.  She does work as a Engineer, building services.  She states she has been able to continue working this week although she has had significant pain.  She's tried anti-inflammatories without improvement in her symptoms.  She denies significant neck pain.  No history of cancer.  No recent weight loss.  No chest pain or shortness of breath.  Denies abdominal pain.   Past Medical History  Diagnosis Date  . Asthma   . Hypertension    History reviewed. No pertinent past surgical history. No family history on file. History  Substance Use Topics  . Smoking status: Current Every Day Smoker    Types: Cigarettes  . Smokeless tobacco: Never Used  . Alcohol Use: No   OB History    No data available     Review of Systems  All other systems reviewed and are negative.     Allergies  Aspirin  Home Medications   Prior to Admission medications   Medication Sig Start Date End Date Taking? Authorizing Provider  albuterol (PROVENTIL) (2.5 MG/3ML) 0.083% nebulizer solution Take 3 mLs (2.5 mg total) by nebulization every 6 (six) hours as needed for wheezing or shortness of breath. 12/15/13  Yes Sherrie George, PA-C  diphenhydrAMINE (BENADRYL) 25 MG tablet Take 1 tablet (25 mg total) by mouth every 8 (eight) hours as needed for itching. 06/03/14  Yes Domenic Moras, PA-C  fluticasone (FLONASE) 50 MCG/ACT nasal spray Place 1 spray into both nostrils daily.   Yes Historical Provider, MD  lisinopril-hydrochlorothiazide (PRINZIDE,ZESTORETIC) 10-12.5 MG  per tablet Take 1 tablet by mouth daily.  11/25/13  Yes Historical Provider, MD  doxycycline (VIBRAMYCIN) 100 MG capsule Take 1 capsule (100 mg total) by mouth 2 (two) times daily. One po bid x 7 days Patient not taking: Reported on 09/05/2014 12/15/13   Sherrie George, PA-C                predniSONE (DELTASONE) 20 MG tablet Take 2 tablets (40 mg total) by mouth daily. 5 day therapy course patient began on 12/12/2013 Patient not taking: Reported on 09/05/2014 12/15/13   Dossie Arbour Lackey, PA-C   BP 161/85 mmHg  Pulse 79  Temp(Src) 97.4 F (36.3 C) (Oral)  Resp 18  SpO2 99%  LMP 04/11/2013 Physical Exam  Constitutional: She is oriented to person, place, and time. She appears well-developed and well-nourished. No distress.  HENT:  Head: Normocephalic and atraumatic.  Eyes: EOM are normal.  Neck: Normal range of motion.  Cardiovascular: Normal rate, regular rhythm and normal heart sounds.   Pulmonary/Chest: Effort normal and breath sounds normal.  Abdominal: Soft. She exhibits no distension. There is no tenderness.  Musculoskeletal: Normal range of motion.  Normal radial pulses bilaterally.  Mild tenderness over left trapezius region.  Mild pain with range of motion of left shoulder.  No weakness of left upper extremity major muscle groups.  Normal grip strength on left hand.  Full range of motion of left wrist, left elbow, left shoulder.  Patient with pain with abduction of left shoulder past 90.  No obvious swelling of her left upper extremity.  Neurological: She is alert and oriented to person, place, and time.  Skin: Skin is warm and dry.  Psychiatric: She has a normal mood and affect. Judgment normal.  Nursing note and vitals reviewed.   ED Course  Procedures (including critical care time) Labs Review Labs Reviewed - No data to display  Imaging Review No results found.   EKG Interpretation None      MDM   Final diagnoses:  Left arm pain    Suspect overuse  injury/rotator cuff issue.  Could represent cervical radiculopathy.  Normal strength in left upper extremity this time.  No indication for imaging.  Patient will follow-up with her primary care physician or referral to orthopedic surgery his been given.  Sling, rest, anti-inflammatories, short course of Percocet.  She understands to return to the ER for new or worsening symptoms.  No arterial insufficiency.  Doubt venous issue.    Hoy Morn, MD 09/05/14 [phone]

## 2014-09-08 ENCOUNTER — Encounter (HOSPITAL_COMMUNITY): Payer: Self-pay | Admitting: Emergency Medicine

## 2014-09-08 ENCOUNTER — Emergency Department (HOSPITAL_COMMUNITY)
Admission: EM | Admit: 2014-09-08 | Discharge: 2014-09-08 | Disposition: A | Discharge status: Home / Self Care | Payer: 59 | Attending: Emergency Medicine | Admitting: Emergency Medicine

## 2014-09-08 DIAGNOSIS — Z79899 Other long term (current) drug therapy: Secondary | ICD-10-CM | POA: Insufficient documentation

## 2014-09-08 DIAGNOSIS — I10 Essential (primary) hypertension: Secondary | ICD-10-CM | POA: Diagnosis not present

## 2014-09-08 DIAGNOSIS — Z7952 Long term (current) use of systemic steroids: Secondary | ICD-10-CM | POA: Diagnosis not present

## 2014-09-08 DIAGNOSIS — M25512 Pain in left shoulder: Secondary | ICD-10-CM | POA: Insufficient documentation

## 2014-09-08 DIAGNOSIS — Z792 Long term (current) use of antibiotics: Secondary | ICD-10-CM | POA: Diagnosis not present

## 2014-09-08 DIAGNOSIS — Z720 Tobacco use: Secondary | ICD-10-CM | POA: Diagnosis not present

## 2014-09-08 DIAGNOSIS — J45909 Unspecified asthma, uncomplicated: Secondary | ICD-10-CM | POA: Insufficient documentation

## 2014-09-08 MED ORDER — METHOCARBAMOL 500 MG PO TABS: 500.0000 mg | ORAL_TABLET | Freq: Two times a day (BID) | ORAL | Status: DC

## 2014-09-08 MED ORDER — METHOCARBAMOL 500 MG PO TABS
500.0000 mg | ORAL_TABLET | Freq: Once | ORAL | Status: DC
  Filled 2014-09-08: qty 1

## 2014-09-08 MED ORDER — OXYCODONE-ACETAMINOPHEN 5-325 MG PO TABS
1.0000 | ORAL_TABLET | Freq: Once | ORAL | Status: DC
  Filled 2014-09-08: qty 1

## 2014-09-08 NOTE — Discharge Instructions (Signed)
Take muscle relaxer as needed.  Do not drive or operate heavy machinery for 4-6 hours after taking.

## 2014-09-08 NOTE — ED Provider Notes (Signed)
CSN: [id number]     Arrival date & time 09/08/14  1230 History  This chart was scribed for Hyman Bible, PA-C with Arbie Cookey, MD by Edison Simon, ED Scribe. This patient was seen in room WTR7/WTR7 and the patient's care was started at 1:11 PM.    Chief Complaint  Patient presents with  . Shoulder Pain  . Arm Pain    recurrent l/arm pain   The history is provided by the patient. No language interpreter was used.    HPI Comments: Cathy Hughes is a 53 y.o. female who presents to the Emergency Department complaining of pain to her left shoulder and arm with onset 1 week ago. She was evaluated here for the same complaint 4 days ago, at which time she was prescribed Percocet ane Ibuprofen which she states she has used without significant improvement. She states she was not prescribed any muscle relaxers. She has an appointment with any orthopedist in 1 week. 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 She reports associated left trapezius swelling. She denies erythema, numbness, or tingling. She states she smokes. She states she is not on any hormone replacement medications. She denies any injuries.   Past Medical History  Diagnosis Date  . Asthma   . Hypertension    Past Surgical History  Procedure Laterality Date  . Colon surgery    . Cholecystectomy    . Thumb surgery     Family History  Problem Relation Age of Onset  . Diabetes Mother   . Hypertension Mother   . Diabetes Father   . Hypertension Father    History  Substance Use Topics  . Smoking status: Current Every Day Smoker    Types: Cigarettes  . Smokeless tobacco: Never Used  . Alcohol Use: No     Comment: occ   OB History    No data available     Review of Systems  Musculoskeletal:       Pain to left shoulder and arm  Skin: Negative for color change.  Neurological: Negative for numbness.      Allergies  Aspirin  Home Medications   Prior to  Admission medications   Medication Sig Start Date End Date Taking? Authorizing Provider  albuterol (PROVENTIL) (2.5 MG/3ML) 0.083% nebulizer solution Take 3 mLs (2.5 mg total) by nebulization every 6 (six) hours as needed for wheezing or shortness of breath. 12/15/13   Sherrie George, PA-C  diphenhydrAMINE (BENADRYL) 25 MG tablet Take 1 tablet (25 mg total) by mouth every 8 (eight) hours as needed for itching. 06/03/14   Domenic Moras, PA-C  doxycycline (VIBRAMYCIN) 100 MG capsule Take 1 capsule (100 mg total) by mouth 2 (two) times daily. One po bid x 7 days Patient not taking: Reported on 09/05/2014 12/15/13   Sherrie George, PA-C  fluticasone [facility]) 50 MCG/ACT nasal spray Place 1 spray into both nostrils daily.    Historical Provider, MD  ibuprofen (ADVIL,MOTRIN) 600 MG tablet Take 1 tablet (600 mg total) by mouth every 8 (eight) hours as needed. 09/05/14   Hoy Morn, MD  lisinopril-hydrochlorothiazide (PRINZIDE,ZESTORETIC) 10-12.5 MG per tablet Take 1 tablet by mouth daily.  11/25/13   Historical Provider, MD  oxyCODONE-acetaminophen (PERCOCET/ROXICET) 5-325 MG per tablet Take 1 tablet by mouth every 4 (four) hours as needed for severe pain. 09/05/14   Hoy Morn, MD  predniSONE (DELTASONE) 20 MG tablet Take 2 tablets (40  mg total) by mouth daily. 5 day therapy course patient began on 12/12/2013 Patient not taking: Reported on 09/05/2014 12/15/13   Dossie Arbour Lackey, PA-C   BP 163/107 mmHg  Pulse 76  Temp(Src) 97.5 F (36.4 C) (Oral)  Resp 17  SpO2 96%  LMP 09/01/2014 (Exact Date) Physical Exam  Constitutional: She is oriented to person, place, and time. She appears well-developed and well-nourished.  HENT:  Head: Normocephalic and atraumatic.  Eyes: Conjunctivae are normal.  Neck: Normal range of motion. Neck supple.  Cardiovascular: Normal rate, regular rhythm and normal heart sounds.   Pulmonary/Chest: Effort normal and breath sounds normal.  Musculoskeletal: Normal range of  motion.  Tenderness to palpation over left trapezius area No erythema or edema of the left shoulder Pain is worse with abduction of the shoulder ROM limited with abduction 2+ radial pulse Distal sensation intact  Neurological: She is alert and oriented to person, place, and time.  Skin: Skin is warm and dry.  Psychiatric: She has a normal mood and affect.  Nursing note and vitals reviewed.   ED Course  Procedures (including critical care time)  DIAGNOSTIC STUDIES: Oxygen Saturation is 96% on room air, adequate by my interpretation.    COORDINATION OF CARE: 1:18 PM Discussed treatment plan with patient at beside, the patient agrees with the plan and has no further questions at this time.   Labs Review Labs Reviewed - No data to display  Imaging Review No results found.   EKG Interpretation None      MDM   Final diagnoses:  None   Patient presents today with pain of the left shoulder.  She is tender to the left trapezius.  Pain worse with ROM of the shoulder.  No signs of infection at this time.  Patient is afebrile.  No acute injury or trauma.  She works as a Secretary/administrator.  She was seen in the ED three days ago for the same and was given referral to Orthopedics.  She has an appointment in one week with Ortho.  Feel that she is stable for discharge.  She is currently on Percocet and Ibuprofen.  Today gave her Rx for Robaxin.  Return precautions given.   Hyman Bible, PA-C 09/08/14 [address], PA-C 09/08/14 [place], MD 09/09/14 [phone]

## 2014-09-08 NOTE — ED Notes (Addendum)
Pt reports pain in l/shoulder and arm pain x 1 week. Evaluated on 12/26. Pt c/o recurrent pain Pt has an appointment with ortho on 09/15/2013 as recommended at previous visit

## 2015-01-29 ENCOUNTER — Emergency Department (HOSPITAL_COMMUNITY): Payer: Self-pay

## 2015-01-29 ENCOUNTER — Emergency Department (HOSPITAL_COMMUNITY)
Admission: EM | Admit: 2015-01-29 | Discharge: 2015-01-30 | Disposition: A | Discharge status: Home / Self Care | Payer: Self-pay | Attending: Emergency Medicine | Admitting: Emergency Medicine

## 2015-01-29 ENCOUNTER — Encounter (HOSPITAL_COMMUNITY): Payer: Self-pay | Admitting: Emergency Medicine

## 2015-01-29 DIAGNOSIS — J45901 Unspecified asthma with (acute) exacerbation: Secondary | ICD-10-CM | POA: Insufficient documentation

## 2015-01-29 DIAGNOSIS — Z720 Tobacco use: Secondary | ICD-10-CM | POA: Insufficient documentation

## 2015-01-29 DIAGNOSIS — Z7952 Long term (current) use of systemic steroids: Secondary | ICD-10-CM | POA: Insufficient documentation

## 2015-01-29 DIAGNOSIS — Z791 Long term (current) use of non-steroidal anti-inflammatories (NSAID): Secondary | ICD-10-CM | POA: Insufficient documentation

## 2015-01-29 DIAGNOSIS — Z79899 Other long term (current) drug therapy: Secondary | ICD-10-CM | POA: Insufficient documentation

## 2015-01-29 DIAGNOSIS — Z792 Long term (current) use of antibiotics: Secondary | ICD-10-CM | POA: Insufficient documentation

## 2015-01-29 DIAGNOSIS — R0602 Shortness of breath: Secondary | ICD-10-CM

## 2015-01-29 DIAGNOSIS — I10 Essential (primary) hypertension: Secondary | ICD-10-CM | POA: Insufficient documentation

## 2015-01-29 LAB — CBC
HEMATOCRIT: 39.1 % (ref 36.0–46.0)
HEMOGLOBIN: 13.1 g/dL (ref 12.0–15.0)
MCH: 27.8 pg (ref 26.0–34.0)
MCHC: 33.5 g/dL (ref 30.0–36.0)
MCV: 82.8 fL (ref 78.0–100.0)
PLATELETS: 393 10*3/uL (ref 150–400)
RBC: 4.72 MIL/uL (ref 3.87–5.11)
RDW: 14.4 % (ref 11.5–15.5)
WBC: 8.8 10*3/uL (ref 4.0–10.5)

## 2015-01-29 LAB — BASIC METABOLIC PANEL
Anion gap: 10 (ref 5–15)
BUN: 13 mg/dL (ref 6–20)
CO2: 23 mmol/L (ref 22–32)
Calcium: 9.5 mg/dL (ref 8.9–10.3)
Chloride: 109 mmol/L (ref 101–111)
Creatinine, Ser: 0.68 mg/dL (ref 0.44–1.00)
GFR calc Af Amer: >60 mL/min (ref >60)
GLUCOSE: 107 mg/dL — AB (ref 65–99)
POTASSIUM: 3.8 mmol/L (ref 3.5–5.1)
SODIUM: 142 mmol/L (ref 135–145)

## 2015-01-29 LAB — I-STAT TROPONIN, ED: Troponin i, poc: 0 ng/mL (ref 0.00–0.08)

## 2015-01-29 LAB — BRAIN NATRIURETIC PEPTIDE: B Natriuretic Peptide: 19.4 pg/mL (ref 0.0–100.0)

## 2015-01-29 IMAGING — CR DG CHEST 2V
2 series · 2 of 2 positions shown · non-contrast
Comparison: Chest radiograph performed [DATE]

CLINICAL DATA: Acute onset of shortness of breath and bilateral
chest pain. Initial encounter.

EXAM:
CHEST  2 VIEW

[w chest pa]
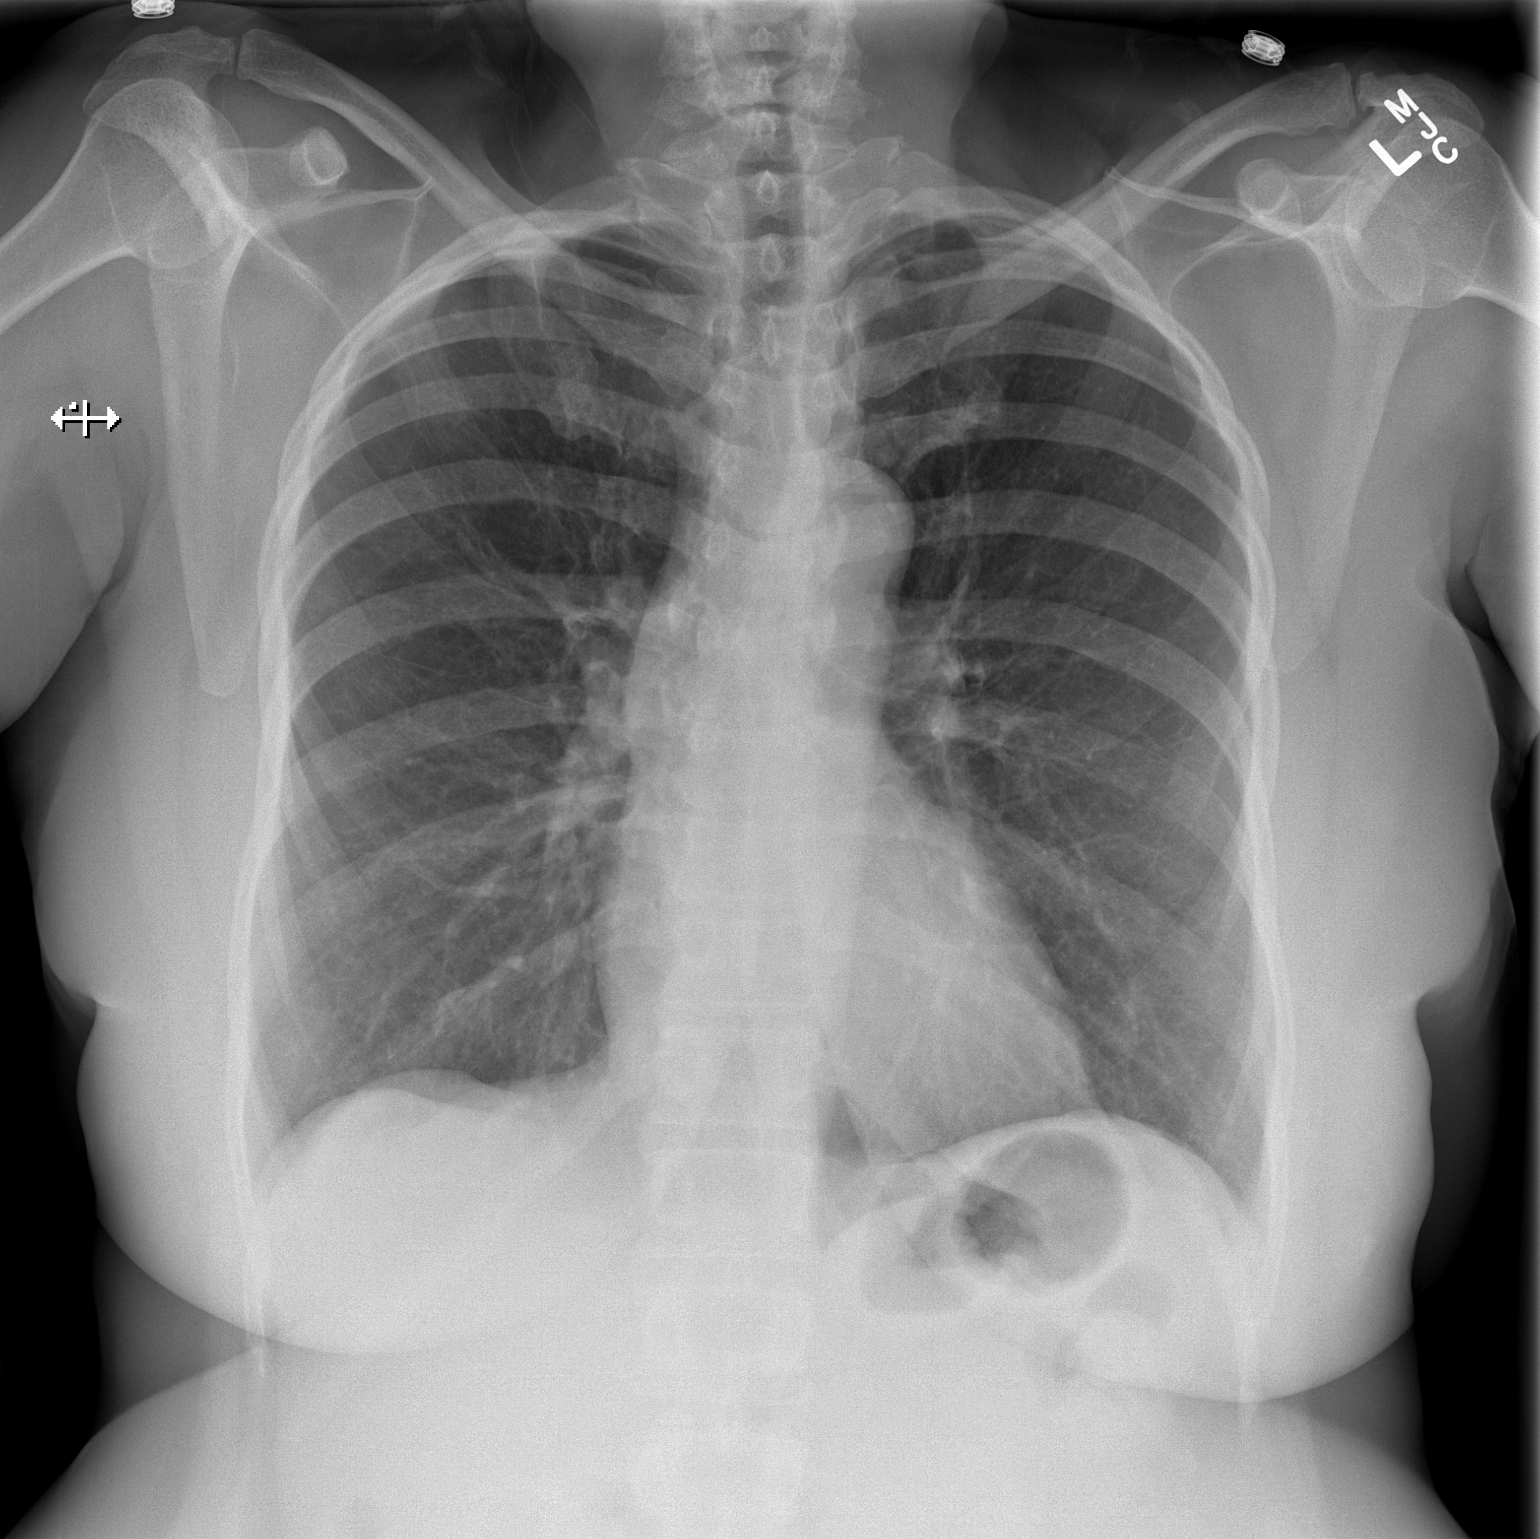

[w chest lat]
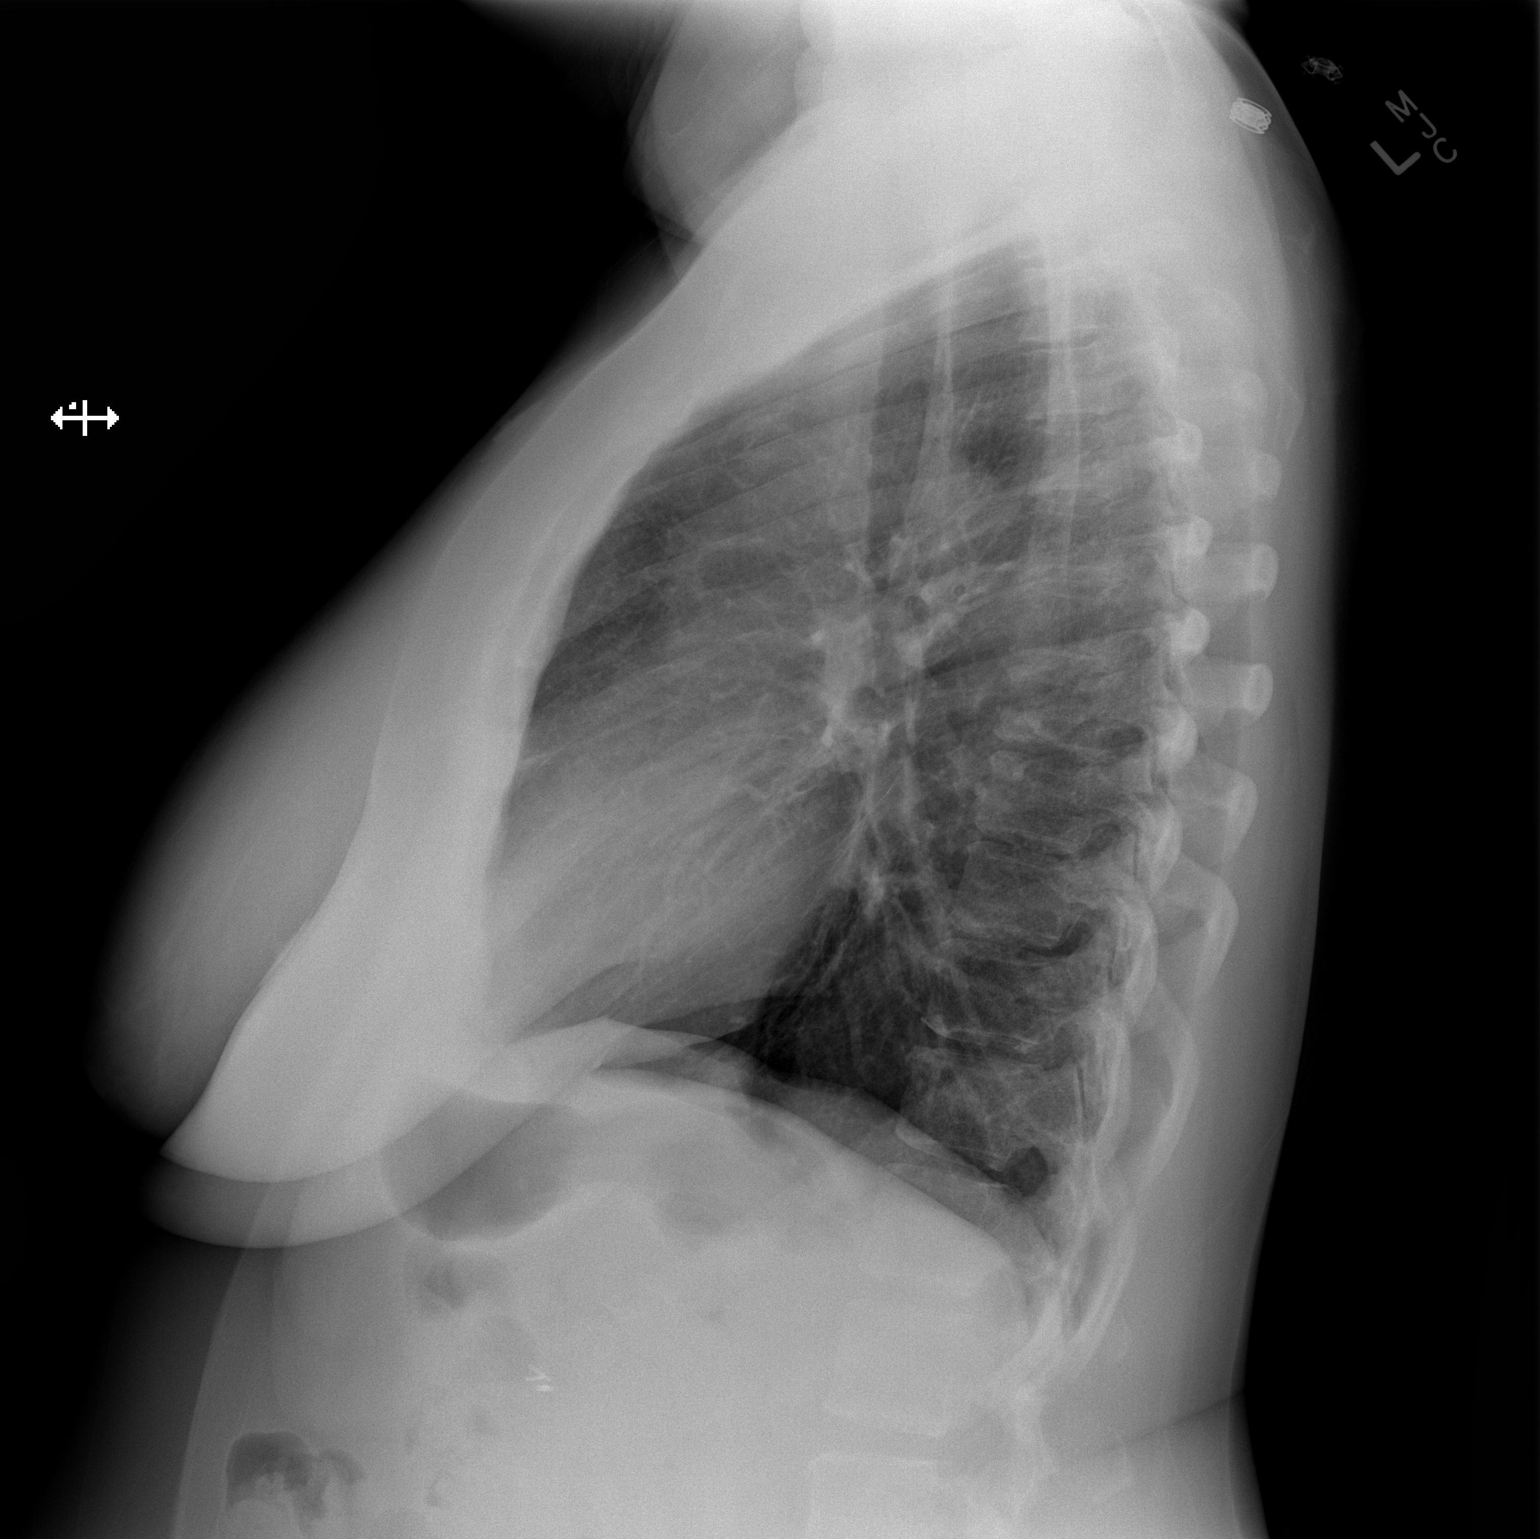

[2 of 2 positions shown; findings below may reference images not displayed]

FINDINGS: The lungs are well-aerated and clear. There is no evidence of focal
opacification, pleural effusion or pneumothorax.

The heart is normal in size; the mediastinal contour is within
normal limits. No acute osseous abnormalities are seen. Clips are
noted within the right upper quadrant, reflecting prior
cholecystectomy.
IMPRESSION: No acute cardiopulmonary process seen.

## 2015-01-29 MED ORDER — ALBUTEROL SULFATE HFA 108 (90 BASE) MCG/ACT IN AERS
2.0000 | INHALATION_SPRAY | RESPIRATORY_TRACT | Status: DC | PRN
  Administered 2015-01-30: 2 via RESPIRATORY_TRACT
  Filled 2015-01-29: qty 6.7

## 2015-01-29 MED ORDER — IPRATROPIUM-ALBUTEROL 0.5-2.5 (3) MG/3ML IN SOLN
3.0000 mL | Freq: Once | RESPIRATORY_TRACT | Status: AC
  Administered 2015-01-29: 3 mL via RESPIRATORY_TRACT
  Filled 2015-01-29: qty 3

## 2015-01-29 MED ORDER — PREDNISONE 20 MG PO TABS
60.0000 mg | ORAL_TABLET | Freq: Once | ORAL | Status: AC
  Administered 2015-01-30: 60 mg via ORAL
  Filled 2015-01-29: qty 3

## 2015-01-29 MED ORDER — ACETAMINOPHEN 325 MG PO TABS
650.0000 mg | ORAL_TABLET | Freq: Once | ORAL | Status: AC
  Administered 2015-01-29: 650 mg via ORAL
  Filled 2015-01-29: qty 2

## 2015-01-29 MED ORDER — PREDNISONE 20 MG PO TABS: 40.0000 mg | ORAL_TABLET | Freq: Every day | ORAL | Status: DC

## 2015-01-29 NOTE — ED Notes (Signed)
Josh PA at bedside   

## 2015-01-29 NOTE — ED Provider Notes (Signed)
CSN: [id number]     Arrival date & time 01/29/15  1919 History   First MD Initiated Contact with Patient 01/29/15 2204     Chief Complaint  Patient presents with  . Shortness of Breath     (Consider location/radiation/quality/duration/timing/severity/associated sxs/prior Treatment) HPI Comments: Patient with history of asthma and bronchitis presents with complaint of one week history of wheezing and shortness of breath with a productive cough. Patient typically uses an albuterol inhaler only at home. She ran out of this 4 days ago and has had nothing to use for her symptoms since that time. She denies any fevers or URI symptoms. No chest pain or abdominal pain. Patient denies risk factors for pulmonary embolism including: unilateral leg swelling, history of DVT/PE/other blood clots, use of estrogens, recent immobilizations, recent surgery, recent travel (>4hr segment), malignancy, hemoptysis. Patient received a breathing treatment on arrival which has made her feel better. Patient is a smoker.    Patient is a 54 y.o. female presenting with shortness of breath. The history is provided by the patient and medical records.  Shortness of Breath Associated symptoms: cough and wheezing   Associated symptoms: no abdominal pain, no chest pain, no fever, no headaches, no rash, no sore throat and no vomiting     Past Medical History  Diagnosis Date  . Asthma   . Hypertension    Past Surgical History  Procedure Laterality Date  . Colon surgery    . Cholecystectomy    . Thumb surgery     Family History  Problem Relation Age of Onset  . Diabetes Mother   . Hypertension Mother   . Diabetes Father   . Hypertension Father    History  Substance Use Topics  . Smoking status: Current Every Day Smoker    Types: Cigarettes  . Smokeless tobacco: Never Used  . Alcohol Use: No     Comment: occ   OB History    No data available     Review of Systems  Constitutional: Negative for fever.   HENT: Negative for rhinorrhea and sore throat.   Eyes: Negative for redness.  Respiratory: Positive for cough, shortness of breath and wheezing.   Cardiovascular: Negative for chest pain.  Gastrointestinal: Negative for nausea, vomiting, abdominal pain and diarrhea.  Genitourinary: Negative for dysuria.  Musculoskeletal: Negative for myalgias.  Skin: Negative for rash.  Neurological: Negative for headaches.      Allergies  Aspirin  Home Medications   Prior to Admission medications   Medication Sig Start Date End Date Taking? Authorizing Provider  albuterol (PROVENTIL) (2.5 MG/3ML) 0.083% nebulizer solution Take 3 mLs (2.5 mg total) by nebulization every 6 (six) hours as needed for wheezing or shortness of breath. 12/15/13  Yes Maude Leriche, PA-C  lisinopril-hydrochlorothiazide (PRINZIDE,ZESTORETIC) 10-12.5 MG per tablet Take 1 tablet by mouth daily.  11/25/13  Yes Historical Provider, MD  diphenhydrAMINE (BENADRYL) 25 MG tablet Take 1 tablet (25 mg total) by mouth every 8 (eight) hours as needed for itching. Patient not taking: Reported on 01/29/2015 06/03/14   Domenic Moras, PA-C  doxycycline (VIBRAMYCIN) 100 MG capsule Take 1 capsule (100 mg total) by mouth 2 (two) times daily. One po bid x 7 days Patient not taking: Reported on 09/05/2014 12/15/13   Maude Leriche, PA-C  ibuprofen (ADVIL,MOTRIN) 600 MG tablet Take 1 tablet (600 mg total) by mouth every 8 (eight) hours as needed. Patient not taking: Reported on 01/29/2015 09/05/14   Jola Schmidt, MD  methocarbamol (ROBAXIN) 500 MG  tablet Take 1 tablet (500 mg total) by mouth 2 (two) times daily. Patient not taking: Reported on 01/29/2015 09/08/14   Hyman Bible, PA-C  oxyCODONE-acetaminophen (PERCOCET/ROXICET) 5-325 MG per tablet Take 1 tablet by mouth every 4 (four) hours as needed for severe pain. Patient not taking: Reported on 01/29/2015 09/05/14   Jola Schmidt, MD  predniSONE (DELTASONE) 20 MG tablet Take 2 tablets (40 mg total) by mouth  daily. 5 day therapy course patient began on 12/12/2013 Patient not taking: Reported on 09/05/2014 12/15/13   Maude Leriche, PA-C   BP 146/84 mmHg  Pulse 81  Resp 21  Wt 175 lb (79.379 kg)  SpO2 98%  LMP 09/30/2014 Physical Exam  Constitutional: She appears well-developed and well-nourished.  HENT:  Head: Normocephalic and atraumatic.  Eyes: Conjunctivae are normal. Right eye exhibits no discharge. Left eye exhibits no discharge.  Neck: Normal range of motion. Neck supple.  Cardiovascular: Normal rate, regular rhythm and normal heart sounds.   Pulmonary/Chest: Effort normal and breath sounds normal. No respiratory distress. She has no wheezes. She has no rales.  Abdominal: Soft. There is no tenderness. There is no rebound and no guarding.  Neurological: She is alert.  Skin: Skin is warm and dry.  Psychiatric: She has a normal mood and affect.  Nursing note and vitals reviewed.   ED Course  Procedures (including critical care time) Labs Review Labs Reviewed  BASIC METABOLIC PANEL - Abnormal; Notable for the following:    Glucose, Bld 107 (*)    All other components within normal limits  CBC  BRAIN NATRIURETIC PEPTIDE  I-STAT TROPOININ, ED    Imaging Review Dg Chest 2 View  01/29/2015   CLINICAL DATA:  Acute onset of shortness of breath and bilateral chest pain. Initial encounter.  EXAM: CHEST  2 VIEW  COMPARISON:  Chest radiograph performed 12/15/2013  FINDINGS: The lungs are well-aerated and clear. There is no evidence of focal opacification, pleural effusion or pneumothorax.  The heart is normal in size; the mediastinal contour is within normal limits. No acute osseous abnormalities are seen. Clips are noted within the right upper quadrant, reflecting prior cholecystectomy.  IMPRESSION: No acute cardiopulmonary process seen.   Electronically Signed   By: Garald Balding M.D.   On: 01/29/2015 20:12     EKG Interpretation   Date/Time:  Friday Jan 29 2015 19:30:43 EDT Ventricular  Rate:  81 PR Interval:  151 QRS Duration: 77 QT Interval:  397 QTC Calculation: 461 R Axis:   72 Text Interpretation:  Sinus rhythm Probable left atrial enlargement No  significant change since last tracing Confirmed by ALLEN  MD, ANTHONY  ([id number]) on 01/29/2015 10:38:54 PM      Patient seen and examined. Work-up reviewed with patient. Additional breathing treatment ordered. Prednisone ordered.  Vital signs reviewed and are as follows: BP 146/84 mmHg  Pulse 81  Resp 21  Wt 175 lb (79.379 kg)  SpO2 98%  LMP 09/30/2014  Patient reports improvement after second breathing treatment. She is comfortable with discharge to home. Will discharge home with prescription for prednisone as well as an albuterol inhaler. I have given the patient a referral to the [facility].   Patient urged to return with worsening symptoms or other concerns. Patient verbalized understanding and agrees with plan.   Patient counseled on use of albuterol HFA. Instructed to use 1-2 puffs q 4 hours as needed for SOB.   MDM   Final diagnoses:  Asthma exacerbation  Patient with asthma exacerbation. No reported fevers. Chest x-ray is clear. No signs of pneumonia. Patient much improved with breathing treatments in emergency department. She is in no respiratory distress. Will give course of prednisone. Do not feel that she needs antibiotics at this time as this is likely viral bronchitis versus asthma exacerbation. No history of COPD.  No dangerous or life-threatening conditions suspected or identified by history, physical exam, and by work-up. No indications for hospitalization identified.      Carlisle Cater, PA-C 01/30/15 2500  Lacretia Leigh, MD 02/02/15 [phone]

## 2015-01-29 NOTE — Discharge Instructions (Signed)
Please read and follow all provided instructions.  Your diagnoses today include:  1. Asthma exacerbation   2. Shortness of breath     Tests performed today include:  Chest x-ray - no pneumonia or other problems  Blood counts and electrolytes - normal  Vital signs. See below for your results today.   Medications prescribed:   Albuterol inhaler - medication that opens up your airway  Use inhaler as follows: 1-2 puffs with spacer every 4 hours as needed for wheezing, cough, or shortness of breath.    Prednisone - steroid medicine   It is best to take this medication in the morning to prevent sleeping problems. If you are diabetic, monitor your blood sugar closely and stop taking Prednisone if blood sugar is over 300. Take with food to prevent stomach upset.   Take any prescribed medications only as directed.  Home care instructions:  Follow any educational materials contained in this packet.  Follow-up instructions: Please follow-up with your primary care provider in the next 3 days for further evaluation of your symptoms and management of your asthma.  Return instructions:   Please return to the Emergency Department if you experience worsening symptoms.  Please return with worsening wheezing, shortness of breath, or difficulty breathing.  Return with persistent fever above 101F.   Please return if you have any other emergent concerns.  Additional Information:  Your vital signs today were: BP 146/84 mmHg   Pulse 81   Resp 21   Wt 175 lb (79.379 kg)   SpO2 98%   LMP 09/30/2014 If your blood pressure (BP) was elevated above 135/85 this visit, please have this repeated by your doctor within one month. --------------

## 2015-01-29 NOTE — ED Notes (Signed)
Xray completed

## 2015-01-29 NOTE — ED Notes (Signed)
Pt given water per request, now she is requesting something for HA

## 2015-01-29 NOTE — ED Notes (Addendum)
Pt from home c/o shortness of breath x few days. Hx of asthma of inhaler. Lung sounds diminished. Pt also c/o left sided chest pain and states "it feels like something is moving" and points to left side of chest.She reports a productive cough with green expectorant.

## 2015-05-24 ENCOUNTER — Emergency Department (HOSPITAL_COMMUNITY)
Admission: EM | Admit: 2015-05-24 | Discharge: 2015-05-24 | Disposition: A | Discharge status: Home / Self Care | Payer: Self-pay | Attending: Emergency Medicine | Admitting: Emergency Medicine

## 2015-05-24 ENCOUNTER — Encounter (HOSPITAL_COMMUNITY): Payer: Self-pay

## 2015-05-24 DIAGNOSIS — M7631 Iliotibial band syndrome, right leg: Secondary | ICD-10-CM | POA: Insufficient documentation

## 2015-05-24 DIAGNOSIS — I10 Essential (primary) hypertension: Secondary | ICD-10-CM | POA: Insufficient documentation

## 2015-05-24 DIAGNOSIS — Z7952 Long term (current) use of systemic steroids: Secondary | ICD-10-CM | POA: Insufficient documentation

## 2015-05-24 DIAGNOSIS — J45909 Unspecified asthma, uncomplicated: Secondary | ICD-10-CM | POA: Insufficient documentation

## 2015-05-24 DIAGNOSIS — Z79899 Other long term (current) drug therapy: Secondary | ICD-10-CM | POA: Insufficient documentation

## 2015-05-24 DIAGNOSIS — Z720 Tobacco use: Secondary | ICD-10-CM | POA: Insufficient documentation

## 2015-05-24 NOTE — ED Notes (Signed)
Patient c/o right thigh burning and pain x 1 month. Patient denies any swelling or injury to the right leg.

## 2015-05-24 NOTE — Discharge Instructions (Signed)
Please follow-up with [facility] wellness for further evaluation and management if symptoms persist. Please use ice, ibuprofen, Tylenol, stretches needed for pain.

## 2015-05-24 NOTE — ED Provider Notes (Signed)
CSN: [id number]     Arrival date & time 05/24/15  1201 History  This chart was scribed for non-physician practitioner Okey Regal, PA-C working with Virgel Manifold, MD by Hilda Lias, ED Scribe. This patient was seen in room WTR6/WTR6 and the patient's care was started at 1:21 PM.  Chief Complaint  Patient presents with  . Leg Pain      The history is provided by the patient. No language interpreter was used.   HPI Comments: Cathy Hughes is a 54 y.o. female who presents to the Emergency Department complaining of constant, sharp, burning right anterior thigh pain with associated numbness and tingling that has been present for a few weeks. Pt states it is painful and "feels like needles" are being stuck in her leg repetitively. Pt reports laying on her right side, walking, and standing make her pain worse, and taking Ibuprofen has provided moderate relief. Pt has not sought care prior to coming to ED today and does not have PCP. Pt states she works in [employer] at [employer] and does a lot of standing and walking every day. Pt denies hx of neck or back trauma.    Past Medical History  Diagnosis Date  . Asthma   . Hypertension    Past Surgical History  Procedure Laterality Date  . Colon surgery    . Cholecystectomy    . Thumb surgery     Family History  Problem Relation Age of Onset  . Diabetes Mother   . Hypertension Mother   . Diabetes Father   . Hypertension Father    Social History  Substance Use Topics  . Smoking status: Current Every Day Smoker -- 0.25 packs/day    Types: Cigarettes  . Smokeless tobacco: Never Used  . Alcohol Use: No     Comment: occ   OB History    No data available     Review of Systems  All other systems reviewed and are negative.     Allergies  Aspirin  Home Medications   Prior to Admission medications   Medication Sig Start Date End Date Taking? Authorizing Provider  albuterol (PROVENTIL) (2.5 MG/3ML) 0.083%  nebulizer solution Take 3 mLs (2.5 mg total) by nebulization every 6 (six) hours as needed for wheezing or shortness of breath. 12/15/13   Maude Leriche, PA-C  lisinopril-hydrochlorothiazide (PRINZIDE,ZESTORETIC) 10-12.5 MG per tablet Take 1 tablet by mouth daily.  11/25/13   Historical Provider, MD  predniSONE (DELTASONE) 20 MG tablet Take 2 tablets (40 mg total) by mouth daily. 01/29/15   Carlisle Cater, PA-C   BP 187/93 mmHg  Pulse 61  Temp(Src) 97.9 F (36.6 C) (Oral)  Resp 18  Ht 5\' 5"  (1.651 m)  Wt 186 lb (84.369 kg)  BMI 30.95 kg/m2  SpO2 98% Physical Exam  Constitutional: She is oriented to person, place, and time. She appears well-developed and well-nourished.  HENT:  Head: Normocephalic and atraumatic.  Cardiovascular: Normal rate.   Pulmonary/Chest: Effort normal.  Abdominal: She exhibits no distension.  Musculoskeletal: She exhibits no edema or tenderness.  Exquisitely tender to right lateral thigh over the ITB No obvious swelling Sensation grossly intact bilateral lower extremities No obvious lower extremity swelling or edema Calves nontender to palpation No CT or L-spine tenderness No obvious trauma to the back No soft tissue tenderness  Neurological: She is alert and oriented to person, place, and time.  Skin: Skin is warm and dry.  Psychiatric: She has a normal mood and affect.  Nursing note and vitals reviewed.   ED Course  Procedures (including critical care time)  DIAGNOSTIC STUDIES: Oxygen Saturation is 96% on room air, normal by my interpretation.    COORDINATION OF CARE: 1:25 PM Discussed treatment plan with pt at bedside and pt agreed to plan.   Labs Review Labs Reviewed - No data to display  Imaging Review No results found. I have personally reviewed and evaluated these images and lab results as part of my medical decision-making.   EKG Interpretation None      MDM   Final diagnoses:  Iliotibial band syndrome, right    Labs:  Imaging:  Consults:  Therapeutics:  Discharge Meds:   Assessment/Plan: Patient's presentation most likely represents iliotibial band syndrome, unlikely due to significant neurological impingement, back involvement.Pt advised to keep taking Ibuprofen regularly, stretch, ice, and massage and roll out the muscles on the lateral aspect of the thigh. Pt also advised to make appointment with PCP if it does not get better with prescribed treatment over the next couple of weeks.       Okey Regal, PA-C 05/24/15 1715  Virgel Manifold, MD 06/03/15 [phone]

## 2016-04-05 ENCOUNTER — Emergency Department
Admission: EM | Admit: 2016-04-05 | Discharge: 2016-04-05 | Disposition: A | Discharge status: Home / Self Care | Payer: Self-pay | Attending: Emergency Medicine | Admitting: Emergency Medicine

## 2016-04-05 ENCOUNTER — Emergency Department: Payer: Self-pay

## 2016-04-05 ENCOUNTER — Encounter: Payer: Self-pay | Admitting: Emergency Medicine

## 2016-04-05 DIAGNOSIS — J441 Chronic obstructive pulmonary disease with (acute) exacerbation: Secondary | ICD-10-CM | POA: Insufficient documentation

## 2016-04-05 DIAGNOSIS — J45909 Unspecified asthma, uncomplicated: Secondary | ICD-10-CM | POA: Insufficient documentation

## 2016-04-05 DIAGNOSIS — F17210 Nicotine dependence, cigarettes, uncomplicated: Secondary | ICD-10-CM | POA: Insufficient documentation

## 2016-04-05 DIAGNOSIS — I10 Essential (primary) hypertension: Secondary | ICD-10-CM | POA: Insufficient documentation

## 2016-04-05 LAB — BASIC METABOLIC PANEL
Anion gap: 8 (ref 5–15)
BUN: 10 mg/dL (ref 6–20)
CHLORIDE: 108 mmol/L (ref 101–111)
CO2: 26 mmol/L (ref 22–32)
Calcium: 9.3 mg/dL (ref 8.9–10.3)
Creatinine, Ser: 0.63 mg/dL (ref 0.44–1.00)
GFR calc Af Amer: >60 mL/min (ref >60)
GFR calc non Af Amer: >60 mL/min (ref >60)
Glucose, Bld: 112 mg/dL — ABNORMAL HIGH (ref 65–99)
Potassium: 3.6 mmol/L (ref 3.5–5.1)
SODIUM: 142 mmol/L (ref 135–145)

## 2016-04-05 LAB — CBC WITH DIFFERENTIAL/PLATELET
Basophils Absolute: 0.1 10*3/uL (ref 0–0.1)
Basophils Relative: 1 %
EOS ABS: 0.1 10*3/uL (ref 0–0.7)
EOS PCT: 2 %
HCT: 38.8 % (ref 35.0–47.0)
HEMOGLOBIN: 13.2 g/dL (ref 12.0–16.0)
Lymphocytes Relative: 36 %
Lymphs Abs: 3.1 10*3/uL (ref 1.0–3.6)
MCH: 28 pg (ref 26.0–34.0)
MCHC: 34 g/dL (ref 32.0–36.0)
MCV: 82.3 fL (ref 80.0–100.0)
MONO ABS: 0.4 10*3/uL (ref 0.2–0.9)
MONOS PCT: 4 %
NEUTROS PCT: 57 %
Neutro Abs: 4.9 10*3/uL (ref 1.4–6.5)
Platelets: 449 10*3/uL — ABNORMAL HIGH (ref 150–440)
RBC: 4.72 MIL/uL (ref 3.80–5.20)
RDW: 15.7 % — AB (ref 11.5–14.5)
WBC: 8.5 10*3/uL (ref 3.6–11.0)

## 2016-04-05 LAB — TROPONIN I

## 2016-04-05 IMAGING — CR DG CHEST 2V
1 series · 2 of 2 positions shown · non-contrast
Comparison: Chest radiograph dated [DATE]

CLINICAL DATA: 54-year-old female with cough and shortness of
breath

EXAM:
CHEST  2 VIEW

[Series 1: dg chest 2 view · 0.14mm/px · 2 of 2 slices shown]
[im 1/2]
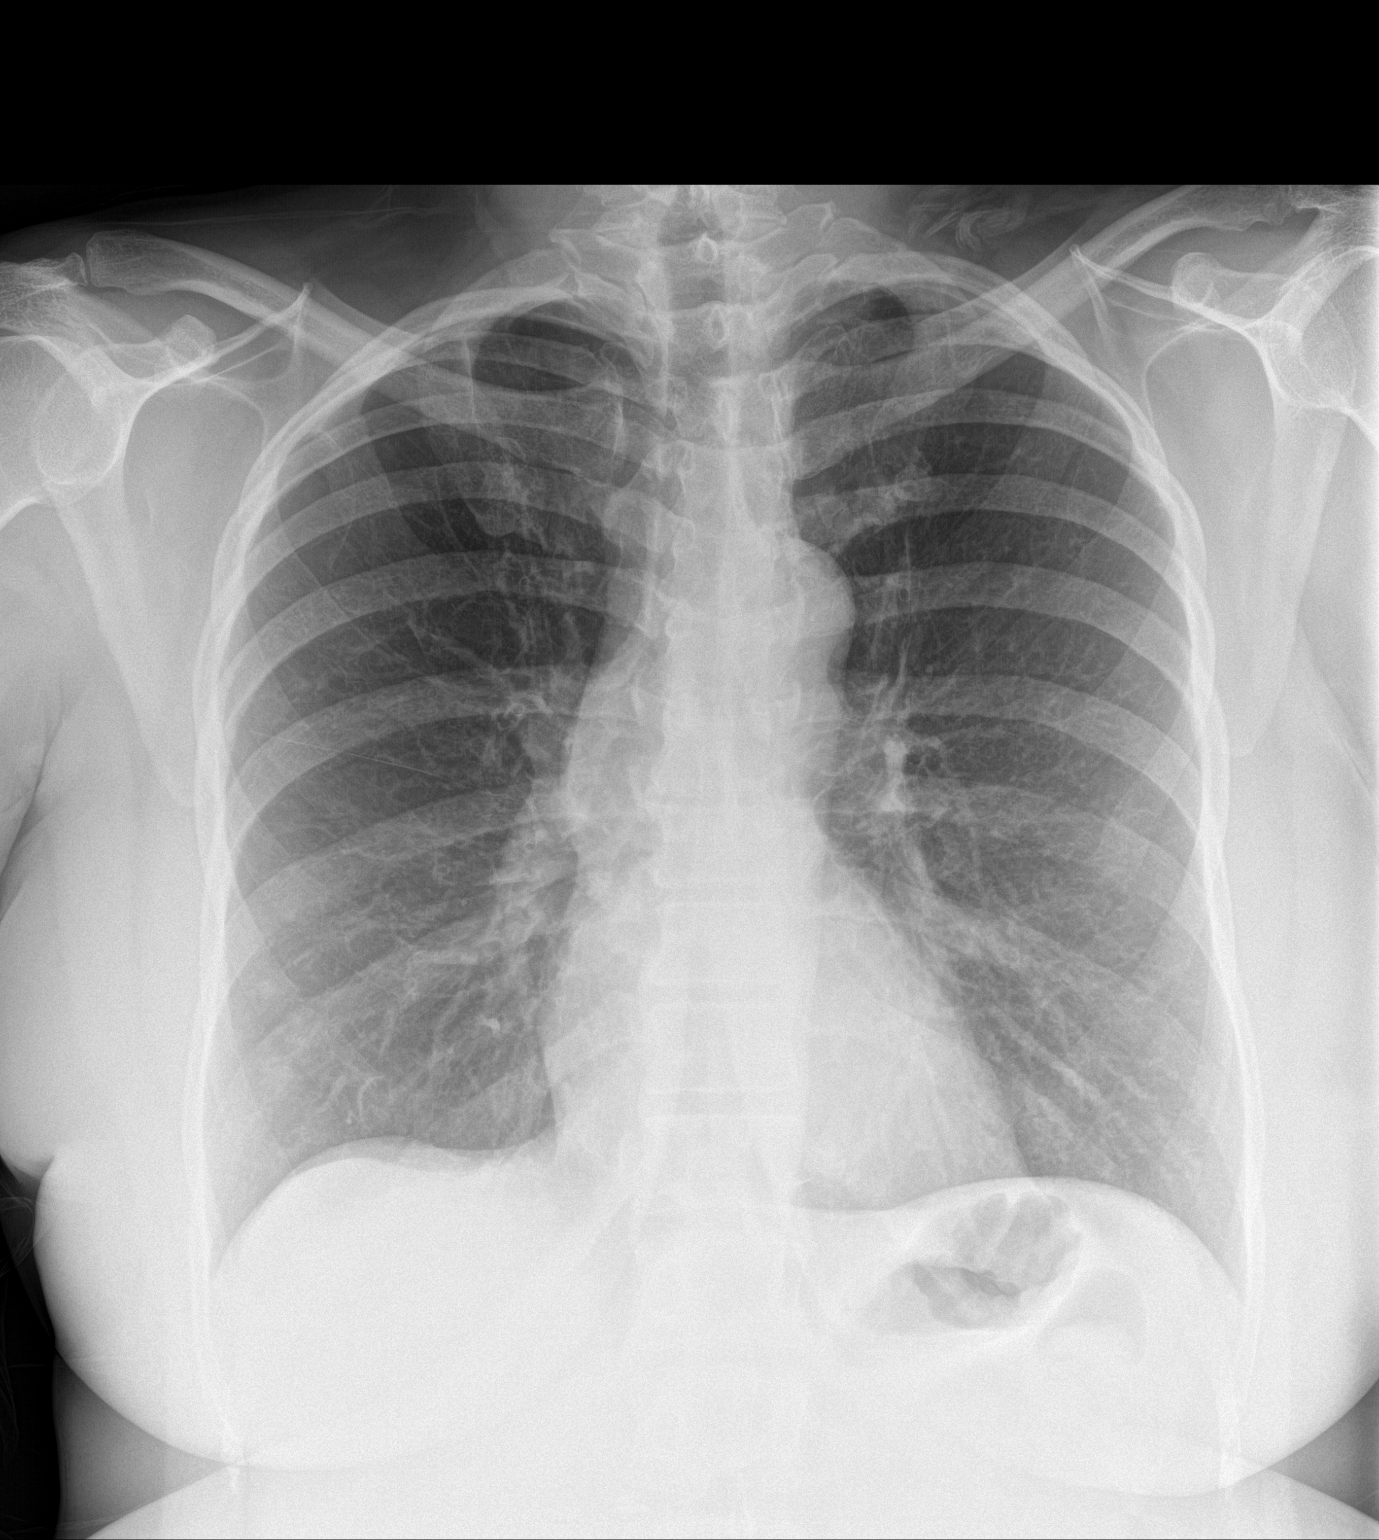
[im 2/2]
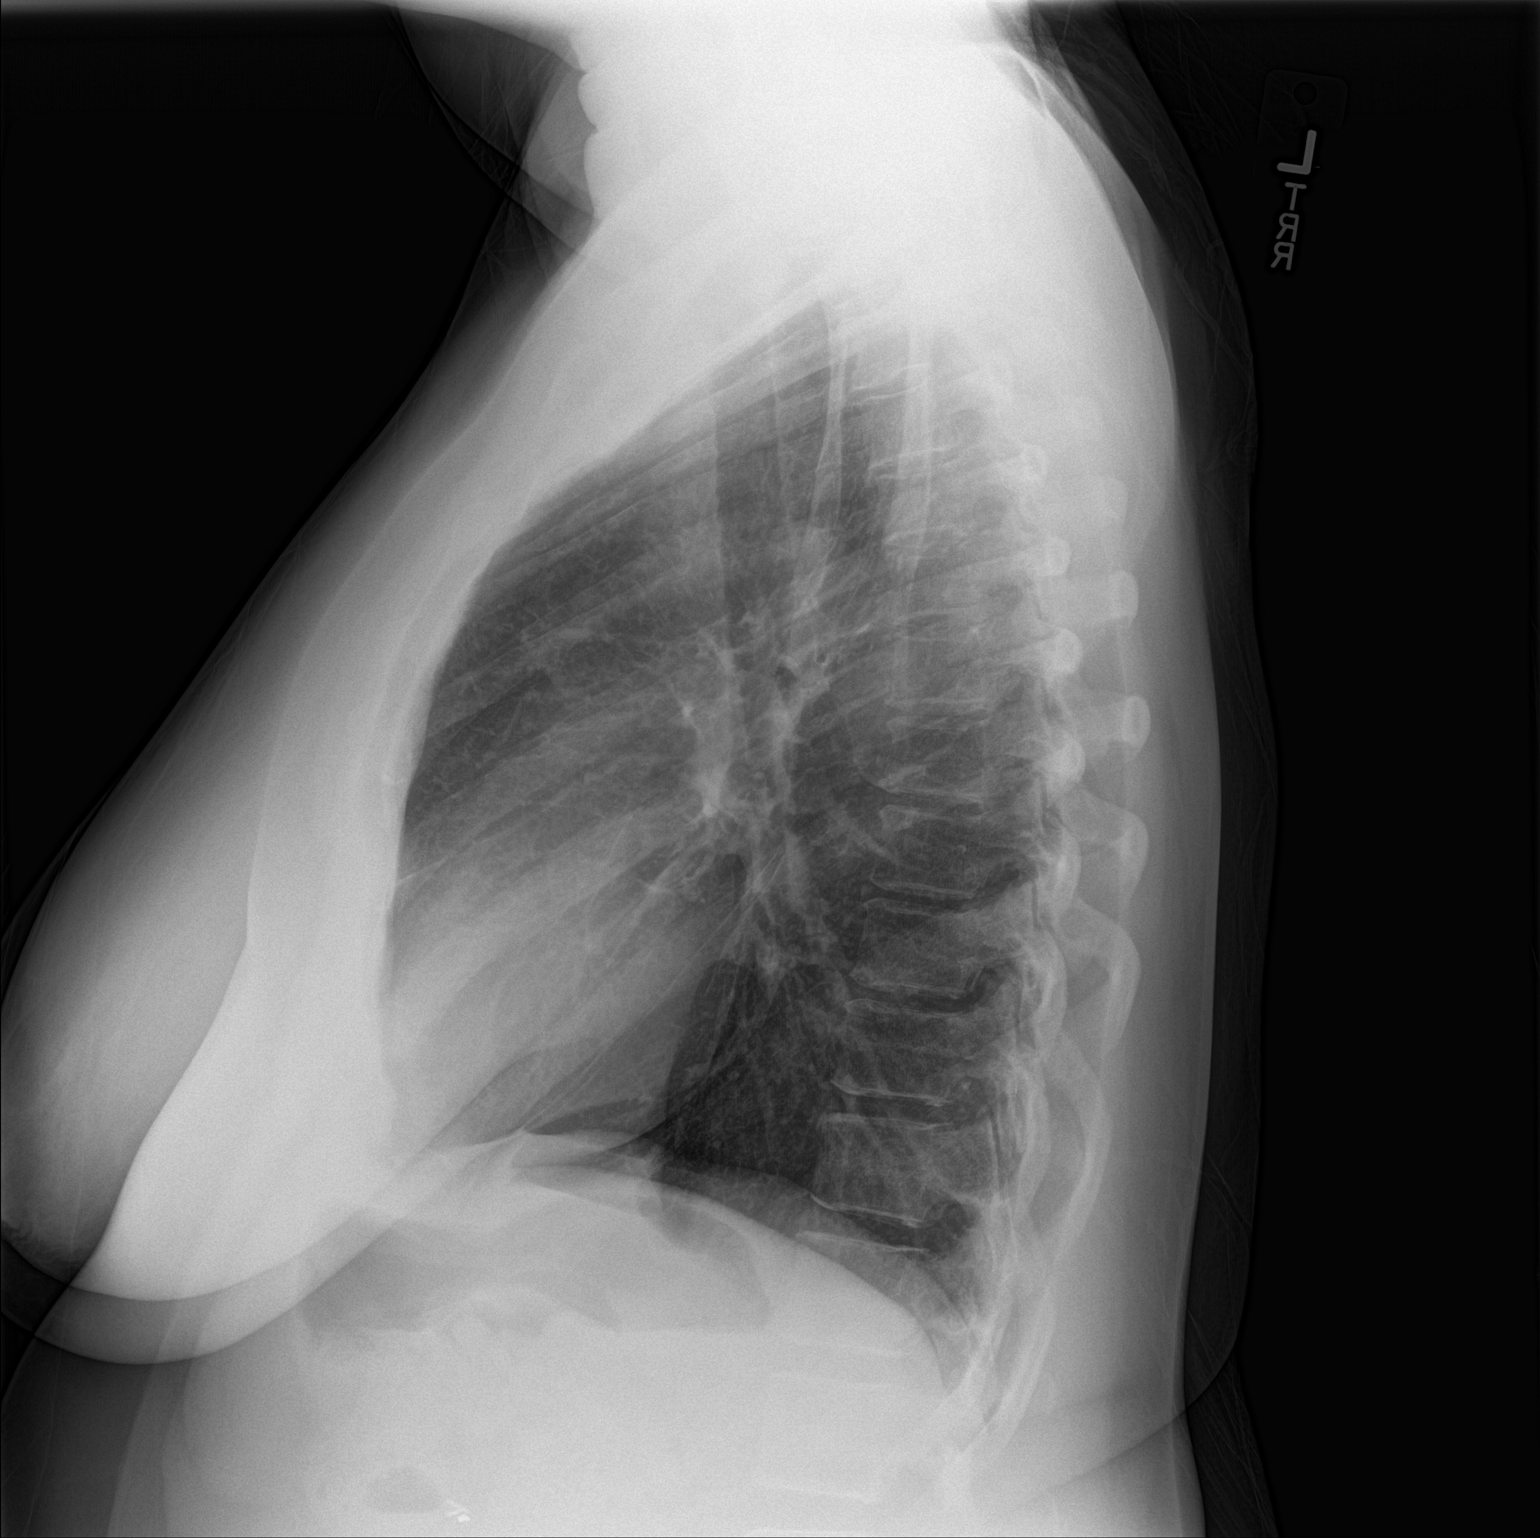

[2 of 2 positions shown; findings below may reference images not displayed]

FINDINGS: There is a faint 4 mm nodular density over the right anterior sixth
rib, probably a vessel on-end. The lungs are otherwise clear. There
is no pleural effusion or pneumothorax. The cardiac silhouette is
within normal limits. No acute osseous pathology.
IMPRESSION: No acute cardiopulmonary process .

## 2016-04-05 MED ORDER — IPRATROPIUM-ALBUTEROL 0.5-2.5 (3) MG/3ML IN SOLN
RESPIRATORY_TRACT | Status: AC
  Filled 2016-04-05: qty 3

## 2016-04-05 MED ORDER — METHYLPREDNISOLONE SODIUM SUCC 125 MG IJ SOLR
125.0000 mg | Freq: Once | INTRAMUSCULAR | Status: AC
  Administered 2016-04-05: 125 mg via INTRAVENOUS
  Filled 2016-04-05: qty 2

## 2016-04-05 MED ORDER — IPRATROPIUM-ALBUTEROL 0.5-2.5 (3) MG/3ML IN SOLN
3.0000 mL | Freq: Once | RESPIRATORY_TRACT | Status: AC
  Administered 2016-04-05: 3 mL via RESPIRATORY_TRACT
  Filled 2016-04-05: qty 3

## 2016-04-05 MED ORDER — AZITHROMYCIN 500 MG PO TABS
500.0000 mg | ORAL_TABLET | Freq: Once | ORAL | Status: AC
  Administered 2016-04-05: 500 mg via ORAL
  Filled 2016-04-05: qty 1

## 2016-04-05 MED ORDER — AZITHROMYCIN 250 MG PO TABS: ORAL_TABLET | ORAL | 0 refills | Status: DC

## 2016-04-05 MED ORDER — ALBUTEROL SULFATE HFA 108 (90 BASE) MCG/ACT IN AERS: 2.0000 | INHALATION_SPRAY | Freq: Four times a day (QID) | RESPIRATORY_TRACT | 2 refills | Status: DC | PRN

## 2016-04-05 MED ORDER — PREDNISONE 20 MG PO TABS: 60.0000 mg | ORAL_TABLET | Freq: Every day | ORAL | 0 refills | Status: AC

## 2016-04-05 MED ORDER — IPRATROPIUM-ALBUTEROL 0.5-2.5 (3) MG/3ML IN SOLN
RESPIRATORY_TRACT | Status: AC
  Administered 2016-04-05: 3 mL via RESPIRATORY_TRACT
  Filled 2016-04-05: qty 3

## 2016-04-05 NOTE — ED Triage Notes (Addendum)
Patient ambulatory to triage with steady gait, without difficulty or distress noted; pt reports hx asthma; st prod cough yellow sputum x 3 days with wheezing; denies fever; st out of inhaler

## 2016-04-05 NOTE — ED Notes (Signed)
Pt went to X-ray.   

## 2016-04-05 NOTE — Discharge Instructions (Signed)

## 2016-04-05 NOTE — ED Provider Notes (Signed)
[facility] Emergency Department Provider Note  ____________________________________________  Time seen: Approximately 7:03 AM  I have reviewed the triage vital signs and the nursing notes.   HISTORY  Chief Complaint Cough   HPI Cathy Hughes is a 55 y.o. female with a history of asthma, hypertension, and smoking who presents for evaluation of shortness of breath. Patient reports 2-3 days of cough productive of yellow sputum and worsening shortness of breath. She reports that she ran out of her inhalers last week. She denies any known sick contact exposures. She also endorses mild chest tightness that is similar to her prior asthma exacerbations. She denies fever. Her chest tightness substernally and nonradiating. She denies any personal or family history blood clots, recent travel or immobilization, history of exogenous hormones, leg pain or swelling. She also denies any history of ischemic heart disease. She is an active smoker. She denies any prior admissions or intubations for her asthma exacerbations.  Past Medical History:  Diagnosis Date  . Asthma   . Hypertension     There are no active problems to display for this patient.   Past Surgical History:  Procedure Laterality Date  . CHOLECYSTECTOMY    . COLON SURGERY    . thumb surgery      Current Outpatient Rx  . Order #: [id number] Class: Print  . Order #: [id number] Class: Print  . Order #: [id number] Class: Historical Med  . Order #: [id number] Class: Print    Allergies Aspirin  Family History  Problem Relation Age of Onset  . Diabetes Mother   . Hypertension Mother   . Diabetes Father   . Hypertension Father     Social History Social History  Substance Use Topics  . Smoking status: Current Every Day Smoker    Packs/day: 0.25    Types: Cigarettes  . Smokeless tobacco: Never Used  . Alcohol use No     Comment: occ    Review of Systems  Constitutional: Negative for  fever. Eyes: Negative for visual changes. ENT: Negative for sore throat. Cardiovascular: + chest tightness Respiratory: + shortness of breath and cough Gastrointestinal: Negative for abdominal pain, vomiting or diarrhea. Genitourinary: Negative for dysuria. Musculoskeletal: Negative for back pain. Skin: Negative for rash. Neurological: Negative for headaches, weakness or numbness.  ____________________________________________   PHYSICAL EXAM:  VITAL SIGNS: ED Triage Vitals  Enc Vitals Group     BP 04/05/16 0635 (!) 156/93     Pulse Rate 04/05/16 0635 96     Resp 04/05/16 0635 18     Temp 04/05/16 0635 98.2 F (36.8 C)     Temp Source 04/05/16 0635 Oral     SpO2 04/05/16 0635 91 %     Weight 04/05/16 0635 175 lb (79.4 kg)     Height 04/05/16 0635 5\' 5"  (1.651 m)     Head Circumference --      Peak Flow --      Pain Score 04/05/16 0645 10     Pain Loc --      Pain Edu? --      Excl. in [place]? --     Constitutional: Alert and oriented. Well appearing and in no apparent distress. HEENT:      Head: Normocephalic and atraumatic.         Eyes: Conjunctivae are normal. Sclera is non-icteric. EOMI. PERRL      Mouth/Throat: Mucous membranes are moist.       Neck: Supple with no signs of meningismus. Cardiovascular: Regular  rate and rhythm. No murmurs, gallops, or rubs. 2+ symmetrical distal pulses are present in all extremities. No JVD. Respiratory: Normal respiratory effort. Lungs are clear to auscultation bilaterally. Diminished air movement on the R. NO wheezing. Patient is actively coughing and desating while coughing but sats were back to normal after that Gastrointestinal: Soft, non tender, and non distended with positive bowel sounds. No rebound or guarding. Genitourinary: No CVA tenderness. Musculoskeletal: Nontender with normal range of motion in all extremities. No edema, cyanosis, or erythema of extremities. Neurologic: Normal speech and language. Face is symmetric.  Moving all extremities. No gross focal neurologic deficits are appreciated. Skin: Skin is warm, dry and intact. No rash noted. Psychiatric: Mood and affect are normal. Speech and behavior are normal.  ____________________________________________   LABS (all labs ordered are listed, but only abnormal results are displayed)  Labs Reviewed  CBC WITH DIFFERENTIAL/PLATELET - Abnormal; Notable for the following:       Result Value   RDW 15.7 (*)    Platelets 449 (*)    All other components within normal limits  BASIC METABOLIC PANEL - Abnormal; Notable for the following:    Glucose, Bld 112 (*)    All other components within normal limits  TROPONIN I   ____________________________________________  EKG  ED ECG REPORT I, Rudene Re, the attending physician, personally viewed and interpreted this ECG.  Normal sinus rhythm, rate of 74, normal intervals, normal axis, no ST elevations or depressions. ____________________________________________  RADIOLOGY  CXR: Negative  ____________________________________________   PROCEDURES  Procedure(s) performed: None Procedures Critical Care performed:  None ____________________________________________   INITIAL IMPRESSION / ASSESSMENT AND PLAN / ED COURSE  55 y.o. female with a history of asthma, hypertension, and smoking who presents for evaluation of shortness of breath in the setting of few days of coughing productive of yellow sputum and medication non compliance and active smoking. Patient cough in the room but in no respiratory distress, she has mild audible wheezing, diminished air movement on the right side. Plan for EKG, troponin, chest x-ray, DuoNeb treatment, Solu-Medrol, and azithromycin.  Clinical Course  Comment By Time  Patient started to wheeze after first duoneb. Moving better air. Will give two more duonebs at this time. EKG, labs, CXR unremarkable Rudene Re, MD 07/26 [phone]  Patient reports that she is  back to baseline and requesting discharge. She is moving great air, no longer wheezing. We'll discharge home with a 4 day booster prednisone course, 4 extra days of azithromycin, and will give her prescription for rescue inhaler. Recommend follow-up with primary care doctor. I discussed return precautions with patient.  I discussed my evaluation of the patient's symptoms, my clinical impression, and my proposed outpatient treatment plan with patient/ family members. We have discussed anticipatory guidance, scheduled follow-up, and careful return precautions. The patient expresses understanding and is comfortable with the discharge plan. All patient's questions were answered.  Rudene Re, MD 07/26 [phone]    Pertinent labs & imaging results that were available during my care of the patient were reviewed by me and considered in my medical decision making (see chart for details).    ____________________________________________   FINAL CLINICAL IMPRESSION(S) / ED DIAGNOSES  Final diagnoses:  COPD exacerbation ([place])      NEW MEDICATIONS STARTED DURING THIS VISIT:  New Prescriptions   ALBUTEROL (PROVENTIL HFA;VENTOLIN HFA) 108 (90 BASE) MCG/ACT INHALER    Inhale 2 puffs into the lungs every 6 (six) hours as needed for wheezing or shortness of breath.  AZITHROMYCIN (ZITHROMAX) 250 MG TABLET    Take one a day for 4 days   PREDNISONE (DELTASONE) 20 MG TABLET    Take 3 tablets (60 mg total) by mouth daily.     Note:  This document was prepared using Dragon voice recognition software and may include unintentional dictation errors.    Rudene Re, MD 04/05/16 [phone]

## 2016-10-07 ENCOUNTER — Encounter: Payer: Self-pay | Admitting: Emergency Medicine

## 2016-10-07 ENCOUNTER — Emergency Department: Payer: BLUE CROSS/BLUE SHIELD

## 2016-10-07 ENCOUNTER — Emergency Department
Admission: EM | Admit: 2016-10-07 | Discharge: 2016-10-08 | Disposition: A | Discharge status: Home / Self Care | Payer: BLUE CROSS/BLUE SHIELD | Attending: Emergency Medicine | Admitting: Emergency Medicine

## 2016-10-07 DIAGNOSIS — J40 Bronchitis, not specified as acute or chronic: Secondary | ICD-10-CM

## 2016-10-07 DIAGNOSIS — J45909 Unspecified asthma, uncomplicated: Secondary | ICD-10-CM | POA: Insufficient documentation

## 2016-10-07 DIAGNOSIS — F17210 Nicotine dependence, cigarettes, uncomplicated: Secondary | ICD-10-CM | POA: Insufficient documentation

## 2016-10-07 DIAGNOSIS — I10 Essential (primary) hypertension: Secondary | ICD-10-CM | POA: Insufficient documentation

## 2016-10-07 DIAGNOSIS — Z720 Tobacco use: Secondary | ICD-10-CM

## 2016-10-07 DIAGNOSIS — Z79899 Other long term (current) drug therapy: Secondary | ICD-10-CM | POA: Insufficient documentation

## 2016-10-07 DIAGNOSIS — J441 Chronic obstructive pulmonary disease with (acute) exacerbation: Secondary | ICD-10-CM | POA: Insufficient documentation

## 2016-10-07 HISTORY — DX: Chronic obstructive pulmonary disease, unspecified: J44.9

## 2016-10-07 LAB — BASIC METABOLIC PANEL
ANION GAP: 7 (ref 5–15)
BUN: 10 mg/dL (ref 6–20)
CHLORIDE: 108 mmol/L (ref 101–111)
CO2: 24 mmol/L (ref 22–32)
Calcium: 9.3 mg/dL (ref 8.9–10.3)
Creatinine, Ser: 0.79 mg/dL (ref 0.44–1.00)
GFR calc Af Amer: >60 mL/min (ref >60)
GLUCOSE: 133 mg/dL — AB (ref 65–99)
POTASSIUM: 3.5 mmol/L (ref 3.5–5.1)
Sodium: 139 mmol/L (ref 135–145)

## 2016-10-07 LAB — CBC
HEMATOCRIT: 34.2 % — AB (ref 35.0–47.0)
HEMOGLOBIN: 11.7 g/dL — AB (ref 12.0–16.0)
MCH: 27.8 pg (ref 26.0–34.0)
MCHC: 34.1 g/dL (ref 32.0–36.0)
MCV: 81.4 fL (ref 80.0–100.0)
Platelets: 397 10*3/uL (ref 150–440)
RBC: 4.2 MIL/uL (ref 3.80–5.20)
RDW: 14.9 % — ABNORMAL HIGH (ref 11.5–14.5)
WBC: 9.5 10*3/uL (ref 3.6–11.0)

## 2016-10-07 LAB — POCT RAPID STREP A: STREPTOCOCCUS, GROUP A SCREEN (DIRECT): NEGATIVE

## 2016-10-07 LAB — TROPONIN I: Troponin I: <0.03 ng/mL (ref <0.03)

## 2016-10-07 IMAGING — CR DG CHEST 2V
2 series · 2 of 2 positions shown · non-contrast
Comparison: Prior radiograph from [DATE].

CLINICAL DATA: Initial evaluation for acute chest pain.

EXAM:
CHEST  2 VIEW

[chest pa]
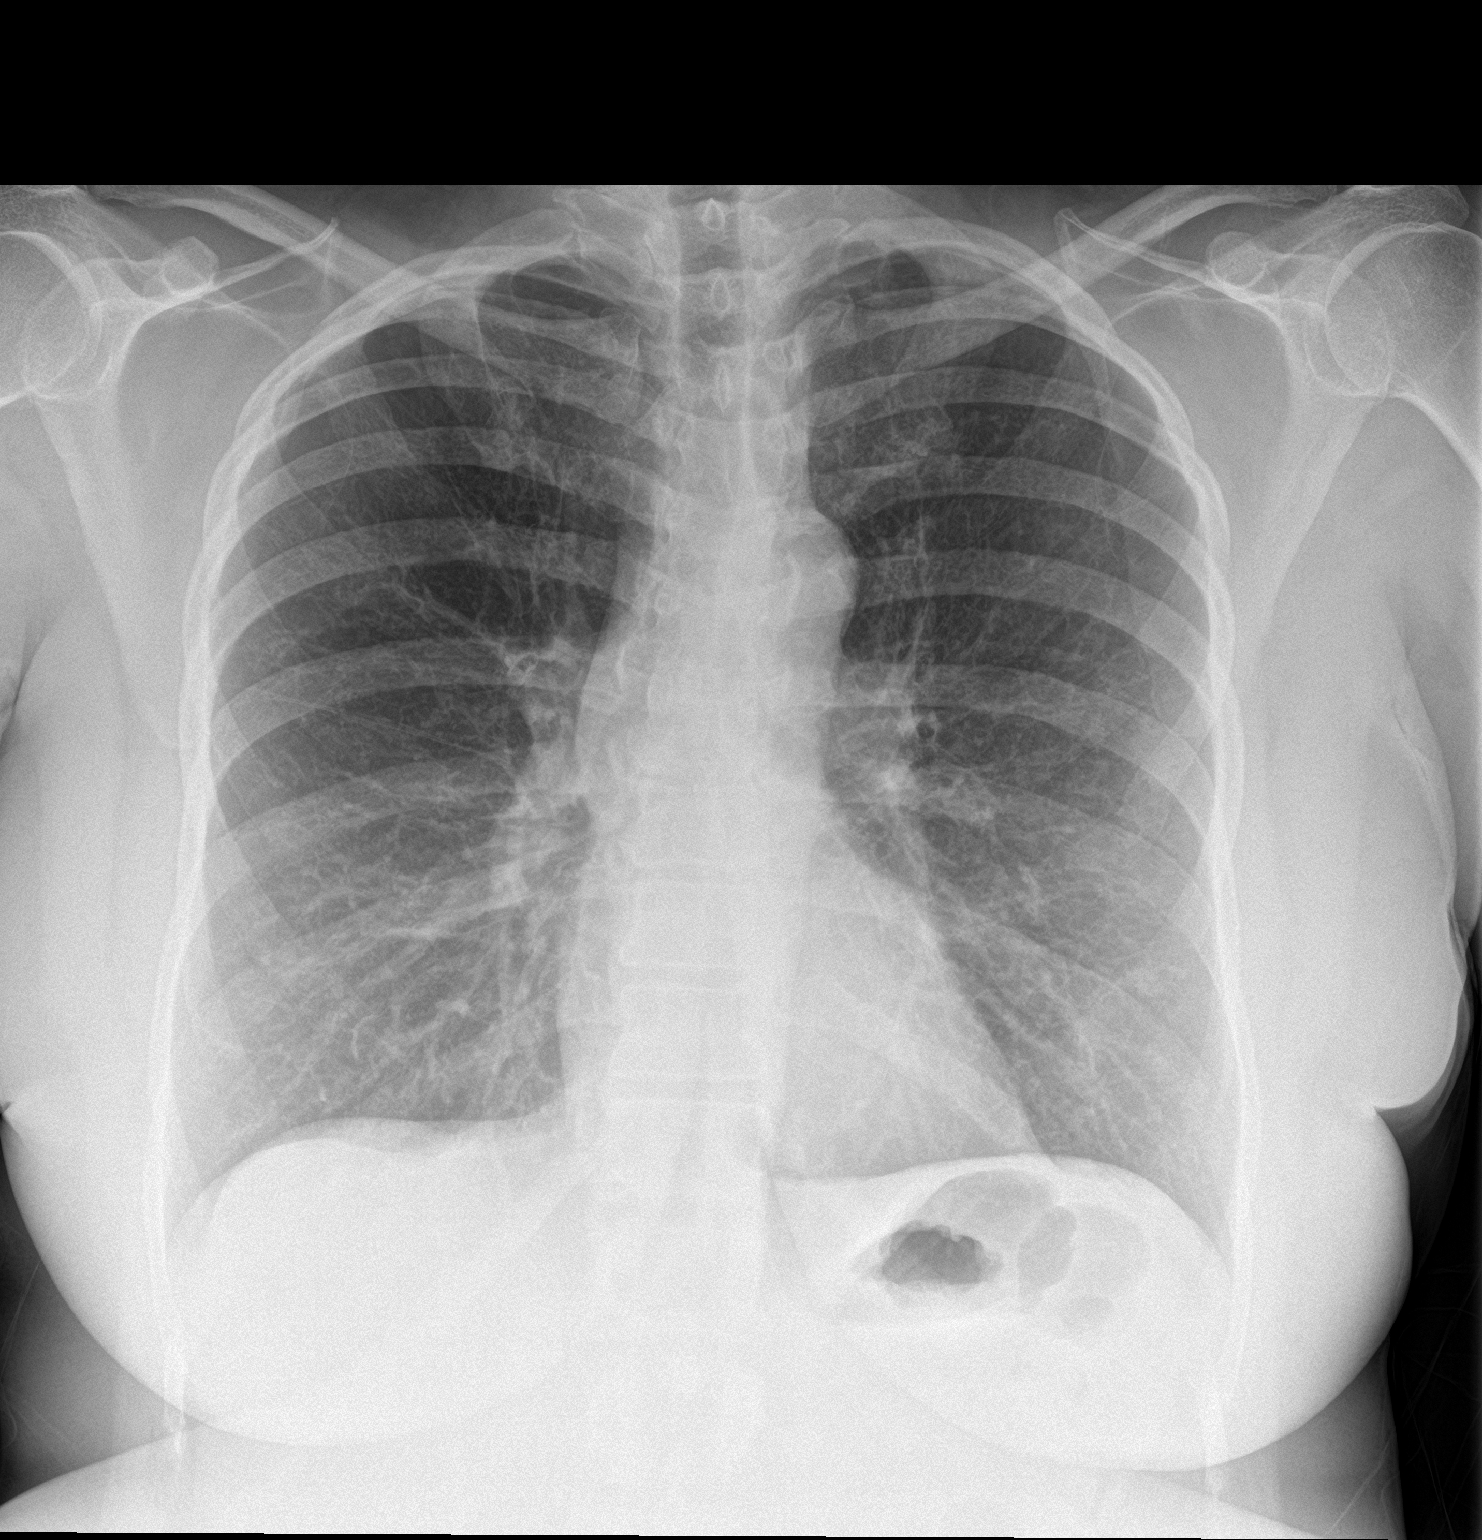

[chest lat]
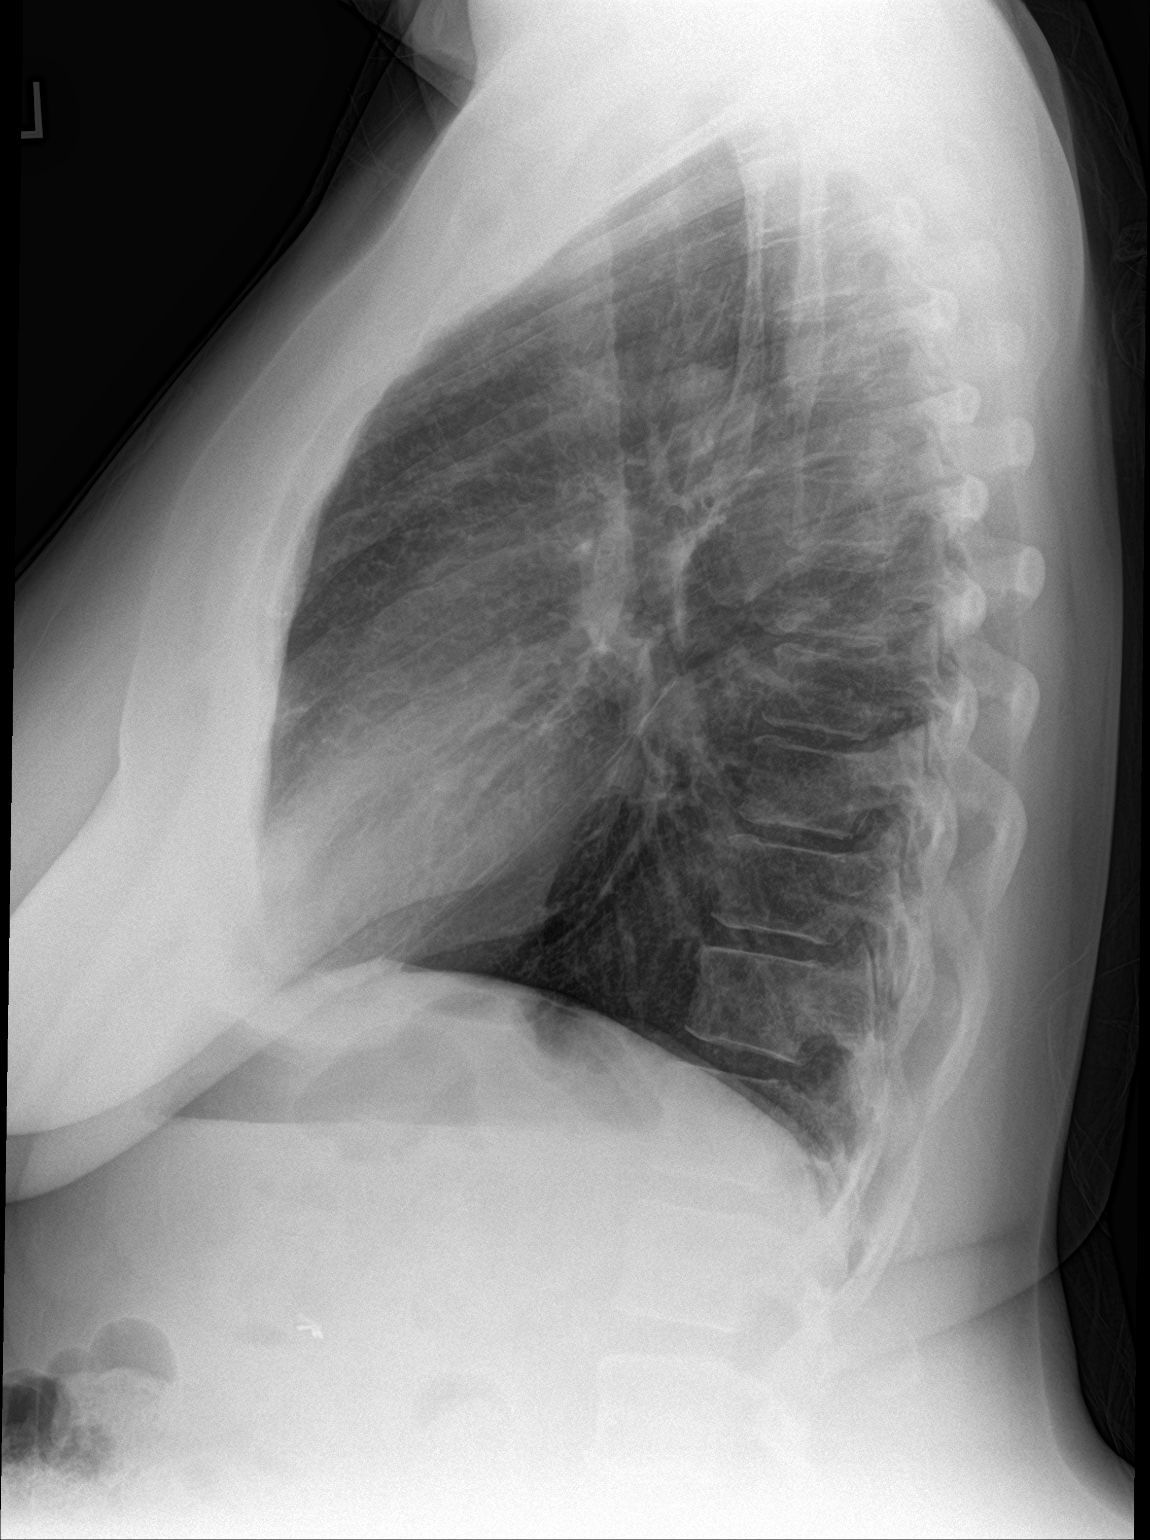

[2 of 2 positions shown; findings below may reference images not displayed]

FINDINGS: The cardiac and mediastinal silhouettes are stable in size and
contour, and remain within normal limits.

The lungs are normally inflated. No airspace consolidation, pleural
effusion, or pulmonary edema is identified. There is no
pneumothorax.

No acute osseous abnormality identified.
IMPRESSION: No active cardiopulmonary disease.

## 2016-10-07 MED ORDER — AZITHROMYCIN 250 MG PO TABS: ORAL_TABLET | ORAL | 0 refills | Status: DC

## 2016-10-07 MED ORDER — PREDNISONE 20 MG PO TABS: 60.0000 mg | ORAL_TABLET | Freq: Every day | ORAL | 0 refills | Status: DC

## 2016-10-07 MED ORDER — PREDNISONE 20 MG PO TABS
60.0000 mg | ORAL_TABLET | Freq: Once | ORAL | Status: AC
  Administered 2016-10-08: 60 mg via ORAL
  Filled 2016-10-07: qty 3

## 2016-10-07 MED ORDER — ALBUTEROL SULFATE (2.5 MG/3ML) 0.083% IN NEBU
5.0000 mg | INHALATION_SOLUTION | Freq: Once | RESPIRATORY_TRACT | Status: AC
  Administered 2016-10-07: 5 mg via RESPIRATORY_TRACT
  Filled 2016-10-07: qty 6

## 2016-10-07 MED ORDER — ALBUTEROL SULFATE HFA 108 (90 BASE) MCG/ACT IN AERS: 2.0000 | INHALATION_SPRAY | Freq: Four times a day (QID) | RESPIRATORY_TRACT | 2 refills | Status: DC | PRN

## 2016-10-07 NOTE — ED Provider Notes (Addendum)
[facility] Emergency Department Provider Note  ____________________________________________   I have reviewed the triage vital signs and the nursing notes.   HISTORY  Chief Complaint Shortness of Breath    HPI Cathy Hughes is a 56 y.o. female history of COPD and asthma, continue to smoke, states that she's had runny nose sore throat and cough for the last few days. Denies fever. Cough is occasionally productive. Ran out of her inhaler. On the way in here she did get a nebulizer treatment and she feels much better. She is ready to go home. He denies any chest pain, she denies any shortness of breath above her what she would expect for her COPD/asthma flare. She does not feel lethargic. She has no leg swelling.      Past Medical History:  Diagnosis Date  . Asthma   . COPD (chronic obstructive pulmonary disease) ([place])   . Hypertension     There are no active problems to display for this patient.   Past Surgical History:  Procedure Laterality Date  . CHOLECYSTECTOMY    . COLON SURGERY    . thumb surgery      Prior to Admission medications   Medication Sig Start Date End Date Taking? Authorizing Provider  albuterol (PROVENTIL HFA;VENTOLIN HFA) 108 (90 Base) MCG/ACT inhaler Inhale 2 puffs into the lungs every 6 (six) hours as needed for wheezing or shortness of breath. 04/05/16   Rudene Re, MD  azithromycin (ZITHROMAX) 250 MG tablet Take one a day for 4 days 04/05/16   Rudene Re, MD  lisinopril-hydrochlorothiazide (PRINZIDE,ZESTORETIC) 10-12.5 MG per tablet Take 1 tablet by mouth daily.  11/25/13   Historical Provider, MD    Allergies Aspirin  Family History  Problem Relation Age of Onset  . Diabetes Mother   . Hypertension Mother   . Diabetes Father   . Hypertension Father     Social History Social History  Substance Use Topics  . Smoking status: Current Every Day Smoker    Packs/day: 0.50    Types: Cigarettes  .  Smokeless tobacco: Never Used  . Alcohol use No     Comment: occ    Review of Systems Constitutional: No fever/chills Eyes: No visual changes. ENT: Positive sore throat. No stiff neck no neck pain positive rhinorrhea Cardiovascular: Denies chest pain. Respiratory: Positive wheeze cough and mild shortness of breath. Gastrointestinal:   no vomiting.  No diarrhea.  No constipation. Genitourinary: Negative for dysuria. Musculoskeletal: Negative lower extremity swelling Skin: Negative for rash. Neurological: Negative for severe headaches, focal weakness or numbness. 10-point ROS otherwise negative.  ____________________________________________   PHYSICAL EXAM:  VITAL SIGNS: ED Triage Vitals  Enc Vitals Group     BP 10/07/16 2234 125/78     Pulse Rate 10/07/16 2234 84     Resp 10/07/16 2234 (!) 22     Temp 10/07/16 2234 98.4 F (36.9 C)     Temp Source 10/07/16 2234 Oral     SpO2 10/07/16 2234 95 %     Weight 10/07/16 2237 180 lb (81.6 kg)     Height 10/07/16 2237 5\' 5"  (1.651 m)     Head Circumference --      Peak Flow --      Pain Score 10/07/16 2237 10     Pain Loc --      Pain Edu? --      Excl. in [place]? --     Constitutional: Alert and oriented. Well appearing and in no  acute distress. Eyes: Conjunctivae are normal. PERRL. EOMI. Head: Atraumatic. Nose: Positive clear congestion/rhinnorhea. Mouth/Throat: Mucous membranes are moist.  Oropharynx non-erythematous. Mild elbow starting a normal voice no stridor no evidence of RPA or TPA Neck: No stridor.   Nontender with no meningismus Cardiovascular: Normal rate, regular rhythm. Grossly normal heart sounds.  Good peripheral circulation. Respiratory: Normal respiratory effort.  No retractions. Lungs CTAB. Abdominal: Soft and nontender. No distention. No guarding no rebound Back:  There is no focal tenderness or step off.  there is no midline tenderness there are no lesions noted. there is no CVA tenderness Musculoskeletal:  No lower extremity tenderness, no upper extremity tenderness. No joint effusions, no DVT signs strong distal pulses no edema Neurologic:  Normal speech and language. No gross focal neurologic deficits are appreciated.  Skin:  Skin is warm, dry and intact. No rash noted. Psychiatric: Mood and affect are normal. Speech and behavior are normal.  ____________________________________________   LABS (all labs ordered are listed, but only abnormal results are displayed)  Labs Reviewed  BASIC METABOLIC PANEL - Abnormal; Notable for the following:       Result Value   Glucose, Bld 133 (*)    All other components within normal limits  CBC - Abnormal; Notable for the following:    Hemoglobin 11.7 (*)    HCT 34.2 (*)    RDW 14.9 (*)    All other components within normal limits  TROPONIN I  POCT RAPID STREP A   ____________________________________________  EKG  I personally interpreted any EKGs ordered by me or triage EKG obtained in triage normal sinus rhythm at 81 bpm no acute ST elevation or acute ST depression normal axis ____________________________________________  RADIOLOGY  I reviewed any imaging ordered by me or triage that were performed during my shift and, if possible, patient and/or family made aware of any abnormal findings. ____________________________________________   PROCEDURES  Procedure(s) performed: None  Procedures  Critical Care performed: None  ____________________________________________   INITIAL IMPRESSION / ASSESSMENT AND PLAN / ED COURSE  Pertinent labs & imaging results that were available during my care of the patient were reviewed by me and considered in my medical decision making (see chart for details).  Patient here with mild COPD flare and URI symptoms. Given her ongoing smoking and productive cough I will start her on azithromycin, we will give her prednisone and I will refill her prescription for her inhaler. Her lungs are clear and she  feels much better at this time. Term precautions and follow-up given and understood, and extensive tobacco abuse counseling provided. ____________________________________________   FINAL CLINICAL IMPRESSION(S) / ED DIAGNOSES  Final diagnoses:  None      This chart was dictated using voice recognition software.  Despite best efforts to proofread,  errors can occur which can change meaning.      Schuyler Amor, MD 10/07/16 [place], MD 10/07/16 [phone]

## 2016-10-07 NOTE — ED Notes (Signed)
Patient transported to X-ray 

## 2016-10-07 NOTE — ED Triage Notes (Signed)
Pt states that she has had a sore throat since Wednesday. Pt has hx of COPD and her work of breathing increased tonight. Pt states that she has used her rescue inhaler this week but is out of her breathing treatments. Pt has appointment scheduled for Thursday at this time. Pt is ambulatory to triage with NAD notes at this time.

## 2017-05-17 ENCOUNTER — Emergency Department: Payer: Self-pay

## 2017-05-17 ENCOUNTER — Encounter: Payer: Self-pay | Admitting: Emergency Medicine

## 2017-05-17 ENCOUNTER — Emergency Department
Admission: EM | Admit: 2017-05-17 | Discharge: 2017-05-17 | Disposition: A | Discharge status: Home / Self Care | Payer: Self-pay | Attending: Emergency Medicine | Admitting: Emergency Medicine

## 2017-05-17 DIAGNOSIS — Z87891 Personal history of nicotine dependence: Secondary | ICD-10-CM | POA: Insufficient documentation

## 2017-05-17 DIAGNOSIS — R079 Chest pain, unspecified: Secondary | ICD-10-CM | POA: Insufficient documentation

## 2017-05-17 DIAGNOSIS — J441 Chronic obstructive pulmonary disease with (acute) exacerbation: Secondary | ICD-10-CM | POA: Insufficient documentation

## 2017-05-17 DIAGNOSIS — I10 Essential (primary) hypertension: Secondary | ICD-10-CM | POA: Insufficient documentation

## 2017-05-17 DIAGNOSIS — Z79899 Other long term (current) drug therapy: Secondary | ICD-10-CM | POA: Insufficient documentation

## 2017-05-17 DIAGNOSIS — J45909 Unspecified asthma, uncomplicated: Secondary | ICD-10-CM | POA: Insufficient documentation

## 2017-05-17 LAB — BASIC METABOLIC PANEL
Anion gap: 10 (ref 5–15)
BUN: 10 mg/dL (ref 6–20)
CALCIUM: 9.6 mg/dL (ref 8.9–10.3)
CO2: 24 mmol/L (ref 22–32)
CREATININE: 0.85 mg/dL (ref 0.44–1.00)
Chloride: 105 mmol/L (ref 101–111)
GFR calc Af Amer: >60 mL/min (ref >60)
Glucose, Bld: 118 mg/dL — ABNORMAL HIGH (ref 65–99)
Potassium: 3.7 mmol/L (ref 3.5–5.1)
SODIUM: 139 mmol/L (ref 135–145)

## 2017-05-17 LAB — CBC
HCT: 37.5 % (ref 35.0–47.0)
Hemoglobin: 12.7 g/dL (ref 12.0–16.0)
MCH: 26.2 pg (ref 26.0–34.0)
MCHC: 33.9 g/dL (ref 32.0–36.0)
MCV: 77.4 fL — ABNORMAL LOW (ref 80.0–100.0)
PLATELETS: 404 10*3/uL (ref 150–440)
RBC: 4.85 MIL/uL (ref 3.80–5.20)
RDW: 16.4 % — AB (ref 11.5–14.5)
WBC: 11.7 10*3/uL — AB (ref 3.6–11.0)

## 2017-05-17 LAB — TROPONIN I: Troponin I: <0.03 ng/mL (ref <0.03)

## 2017-05-17 IMAGING — CR DG CHEST 2V
2 series · 2 of 2 positions shown · non-contrast
Comparison: [DATE]

CLINICAL DATA: Chest pain and shortness of breath for 2 weeks

EXAM:
CHEST  2 VIEW

[chest pa]
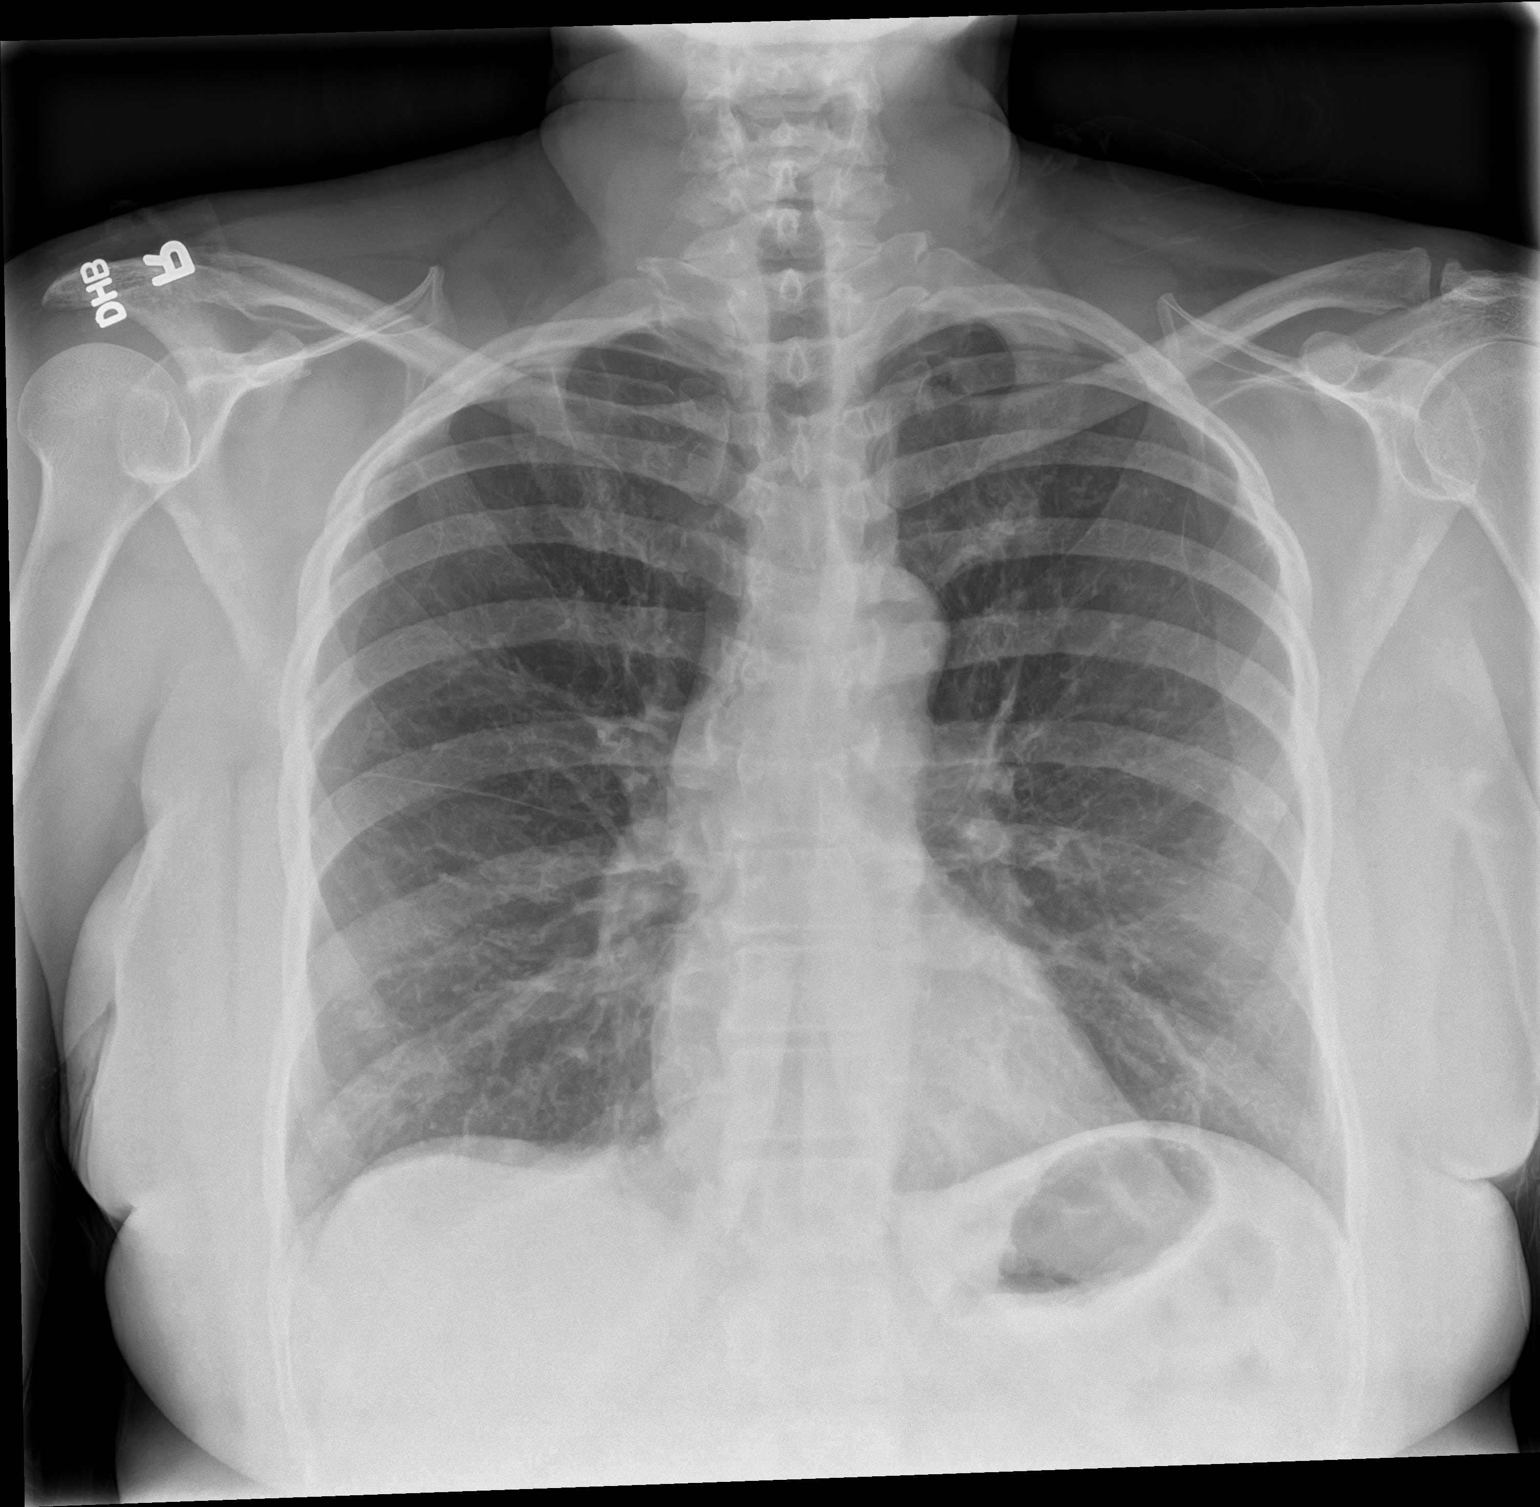

[chest lat]
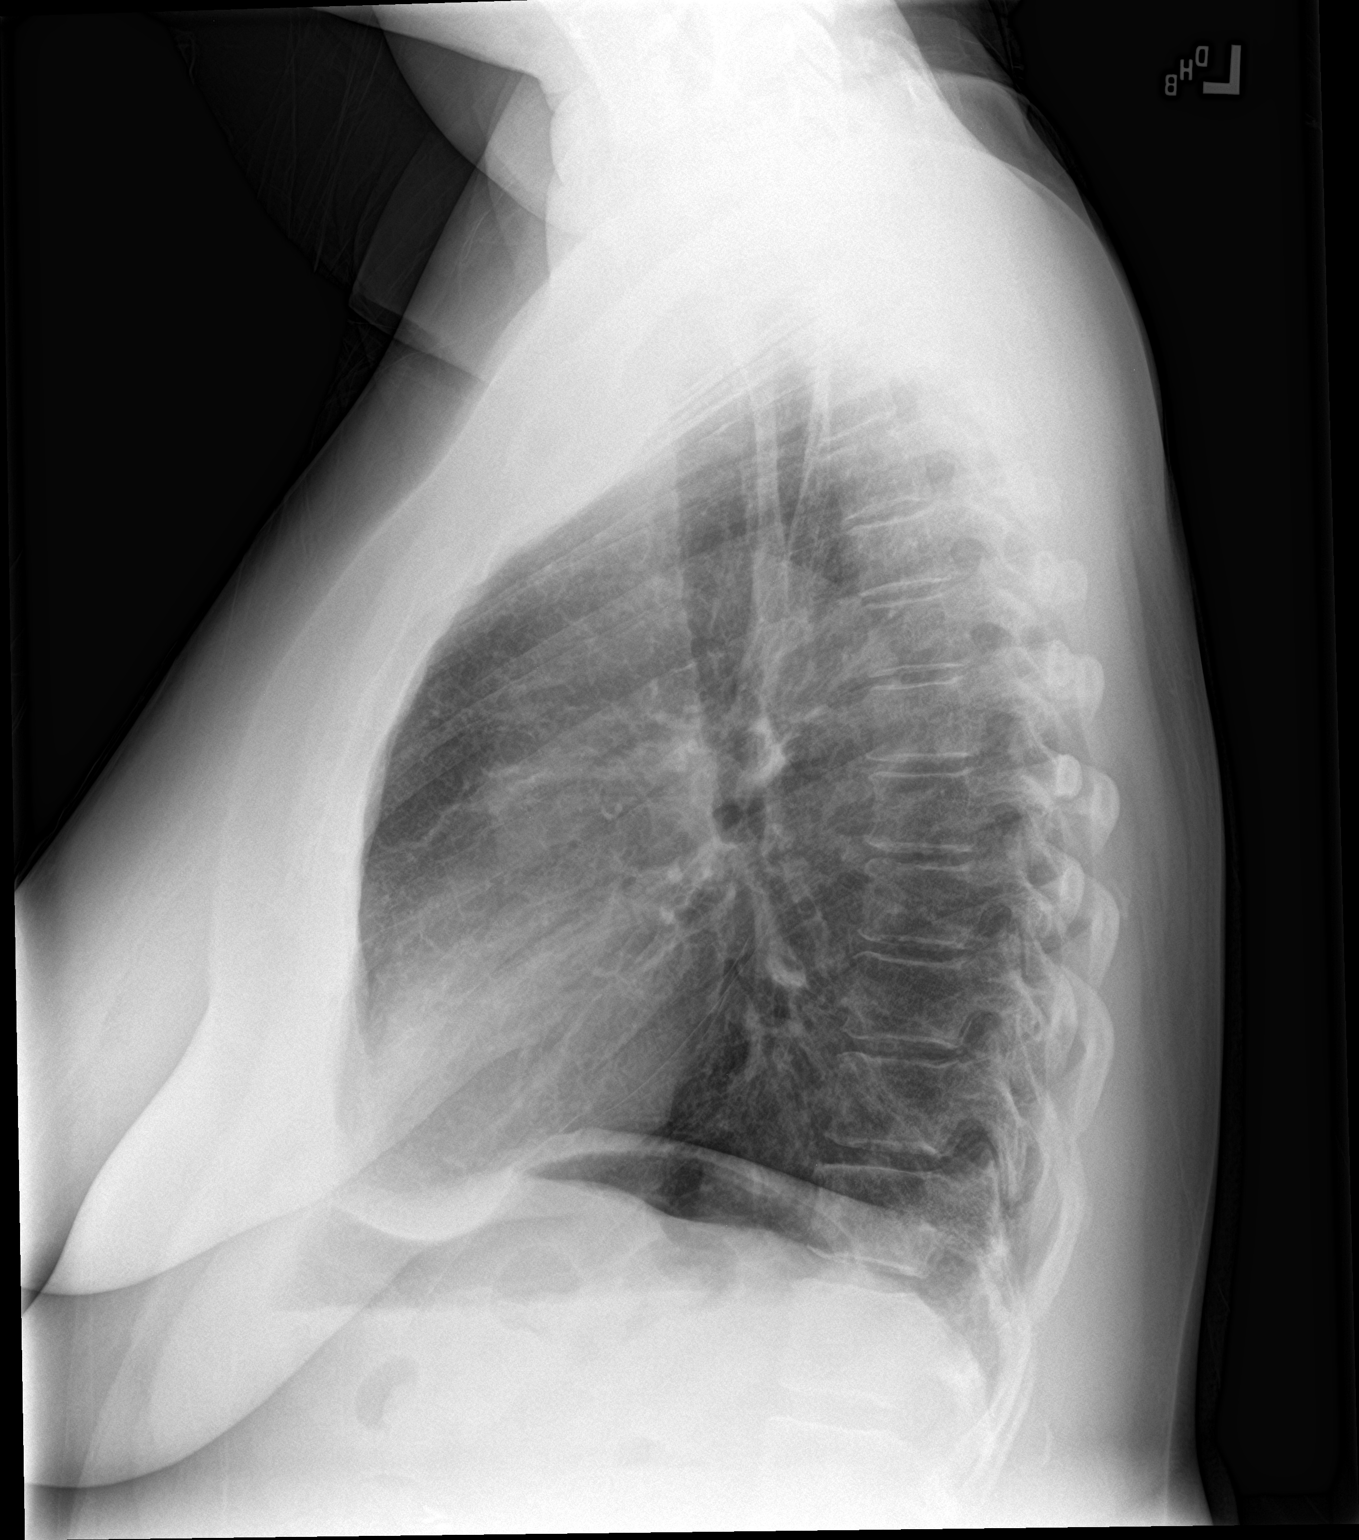

[2 of 2 positions shown; findings below may reference images not displayed]

FINDINGS: The heart size and mediastinal contours are within normal limits.
There is no focal infiltrate, pulmonary edema, or pleural effusion.
The visualized skeletal structures are unremarkable.
IMPRESSION: No active cardiopulmonary disease.

## 2017-05-17 MED ORDER — ALBUTEROL SULFATE (2.5 MG/3ML) 0.083% IN NEBU
5.0000 mg | INHALATION_SOLUTION | Freq: Once | RESPIRATORY_TRACT | Status: AC
  Administered 2017-05-17: 5 mg via RESPIRATORY_TRACT
  Filled 2017-05-17: qty 6

## 2017-05-17 MED ORDER — OXYMETAZOLINE HCL 0.05 % NA SOLN
1.0000 | Freq: Once | NASAL | Status: AC
  Administered 2017-05-17: 1 via NASAL
  Filled 2017-05-17: qty 15

## 2017-05-17 MED ORDER — IPRATROPIUM-ALBUTEROL 0.5-2.5 (3) MG/3ML IN SOLN
3.0000 mL | Freq: Once | RESPIRATORY_TRACT | Status: AC
  Administered 2017-05-17: 3 mL via RESPIRATORY_TRACT
  Filled 2017-05-17: qty 3

## 2017-05-17 MED ORDER — PREDNISONE 20 MG PO TABS
60.0000 mg | ORAL_TABLET | Freq: Once | ORAL | Status: AC
Start: 2017-05-17 — End: 2017-05-17
  Administered 2017-05-17: 60 mg via ORAL
  Filled 2017-05-17: qty 3

## 2017-05-17 MED ORDER — PREDNISONE 20 MG PO TABS: 60.0000 mg | ORAL_TABLET | Freq: Every day | ORAL | 0 refills | Status: AC

## 2017-05-17 MED ORDER — AZITHROMYCIN 250 MG PO TABS: ORAL_TABLET | ORAL | 0 refills | Status: DC

## 2017-05-17 MED ORDER — AZITHROMYCIN 500 MG PO TABS
500.0000 mg | ORAL_TABLET | Freq: Once | ORAL | Status: AC
  Administered 2017-05-17: 500 mg via ORAL
  Filled 2017-05-17: qty 1

## 2017-05-17 MED ORDER — ALBUTEROL SULFATE HFA 108 (90 BASE) MCG/ACT IN AERS: 2.0000 | INHALATION_SPRAY | Freq: Four times a day (QID) | RESPIRATORY_TRACT | 2 refills | Status: DC | PRN

## 2017-05-17 NOTE — ED Provider Notes (Signed)
[facility] Emergency Department Provider Note  ____________________________________________  Time seen: Approximately 7:25 PM  I have reviewed the triage vital signs and the nursing notes.   HISTORY  Chief Complaint Shortness of Breath and Chest Pain   HPI Cathy Hughes is a 56 y.o. female with a history of COPD, asthma, hypertension, smoking who presents for evaluation of chest pain and shortness of breath. Patient reports 2 weeks of cough productive of yellow phlegm, congestion, chills, wheezing and shortness of breath. She also has had intermittent chest tightness located in the center of her chest that resolves with her breathing treatments. She reports that she was smoking until yesterday but stopped today. She denies fever. She has had nausea but no vomiting, no diarrhea. No personal or family history of blood clots or ischemic heart disease, no recent travel or immobilization, no leg pain or swelling, no hemoptysis, no exogenous hormones. Patient has no CP at this time after receiving one albuterol neb in the waiting room.  Past Medical History:  Diagnosis Date  . Asthma   . COPD (chronic obstructive pulmonary disease) ([place])   . Hypertension     There are no active problems to display for this patient.   Past Surgical History:  Procedure Laterality Date  . CHOLECYSTECTOMY    . COLON SURGERY    . thumb surgery      Prior to Admission medications   Medication Sig Start Date End Date Taking? Authorizing Provider  albuterol (PROVENTIL HFA;VENTOLIN HFA) 108 (90 Base) MCG/ACT inhaler Inhale 2 puffs into the lungs every 6 (six) hours as needed for wheezing or shortness of breath. 10/07/16   Schuyler Amor, MD  albuterol (PROVENTIL HFA;VENTOLIN HFA) 108 (90 Base) MCG/ACT inhaler Inhale 2 puffs into the lungs every 6 (six) hours as needed for wheezing or shortness of breath. 05/17/17   Rudene Re, MD  azithromycin [facility]) 250 MG tablet  Take 1 a day for 4 days 05/17/17   Alfred Levins, [place], MD  lisinopril-hydrochlorothiazide (PRINZIDE,ZESTORETIC) 10-12.5 MG per tablet Take 1 tablet by mouth daily.  11/25/13   [provider]  predniSONE (DELTASONE) 20 MG tablet Take 3 tablets (60 mg total) by mouth daily. 05/17/17 05/21/17  Rudene Re, MD    Allergies Aspirin  Family History  Problem Relation Age of Onset  . Diabetes Mother   . Hypertension Mother   . Diabetes Father   . Hypertension Father     Social History Social History  Substance Use Topics  . Smoking status: Former Smoker    Packs/day: 0.50    Types: Cigarettes    Quit date: 05/16/2017  . Smokeless tobacco: Never Used  . Alcohol use No     Comment: occ    Review of Systems  Constitutional: Negative for fever. + chills Eyes: Negative for visual changes. ENT: Negative for sore throat. Neck: No neck pain  Cardiovascular:+ chest tightness Respiratory: + shortness of breath, wheezing, cough Gastrointestinal: Negative for abdominal pain, vomiting or diarrhea. + nausea Genitourinary: Negative for dysuria. Musculoskeletal: Negative for back pain. Skin: Negative for rash. Neurological: Negative for headaches, weakness or numbness. Psych: No SI or HI  ____________________________________________   PHYSICAL EXAM:  VITAL SIGNS: ED Triage Vitals  Enc Vitals Group     BP 05/17/17 1832 (!) 142/85     Pulse Rate 05/17/17 1832 92     Resp 05/17/17 1832 (!) 26     Temp 05/17/17 1832 98.4 F (36.9 C)  Temp Source 05/17/17 1832 Oral     SpO2 05/17/17 1832 91 %     Weight 05/17/17 1827 202 lb (91.6 kg)     Height 05/17/17 1827 5\' 5"  (1.651 m)     Head Circumference --      Peak Flow --      Pain Score 05/17/17 1826 10     Pain Loc --      Pain Edu? --      Excl. in [place]? --     Constitutional: Alert and oriented. Well appearing and in no apparent distress. HEENT:      Head: Normocephalic and atraumatic.         Eyes: Conjunctivae are  normal. Sclera is non-icteric.       Mouth/Throat: Mucous membranes are moist.       Neck: Supple with no signs of meningismus. Cardiovascular: Regular rate and rhythm. No murmurs, gallops, or rubs. 2+ symmetrical distal pulses are present in all extremities. No JVD. Respiratory: Normal respiratory effort. Lungs are clear to auscultation bilaterally. No wheezes, crackles, or rhonchi.  Gastrointestinal: Soft, non tender, and non distended with positive bowel sounds. No rebound or guarding. Musculoskeletal: Nontender with normal range of motion in all extremities. No edema, cyanosis, or erythema of extremities. Neurologic: Normal speech and language. Face is symmetric. Moving all extremities. No gross focal neurologic deficits are appreciated. Skin: Skin is warm, dry and intact. No rash noted. Psychiatric: Mood and affect are normal. Speech and behavior are normal.  ____________________________________________   LABS (all labs ordered are listed, but only abnormal results are displayed)  Labs Reviewed  BASIC METABOLIC PANEL - Abnormal; Notable for the following:       Result Value   Glucose, Bld 118 (*)    All other components within normal limits  CBC - Abnormal; Notable for the following:    WBC 11.7 (*)    MCV 77.4 (*)    RDW 16.4 (*)    All other components within normal limits  TROPONIN I   ____________________________________________  EKG  ED ECG REPORT I, Rudene Re, the attending physician, personally viewed and interpreted this ECG.  Normal sinus rhythm, rate of 95, normal intervals, normal axis, no ST elevations or depressions. ____________________________________________  RADIOLOGY  CXR: negative  ____________________________________________   PROCEDURES  Procedure(s) performed: None Procedures Critical Care performed:  None ____________________________________________   INITIAL IMPRESSION / ASSESSMENT AND PLAN / ED COURSE  56 y.o. female with a  history of COPD, asthma, hypertension, smoking who presents for evaluation of shortness of breath, wheezing, chest tightness, cough, congestion, nausea, and chills. Patient was wheezing in triage and given one albuterol neb prior to my evaluation. She feels markedly improved during my exam with resolution of chest tightness. No longer wheezing but still slightly decreased air movement. Will give one duoneb, prednisone 60mg . Will check labs and CXR.     _________________________ 9:40 PM on 05/17/2017 -----------------------------------------  patient is markedly improved. Moving great air with no wheezing. No longer having chest tightness or shortness of breath. Chest x-ray with no evidence of pneumonia. Patient's condition markedly improved. Patient will be discharged home on albuterol, prednisone, and Z-Pak for COPD exacerbation. Discussed the dangers of smoking especially patients who have lung disease. Discussed return precautions with patient.  Pertinent labs & imaging results that were available during my care of the patient were reviewed by me and considered in my medical decision making (see chart for details).    ____________________________________________   FINAL  CLINICAL IMPRESSION(S) / ED DIAGNOSES  Final diagnoses:  COPD exacerbation ([place])      NEW MEDICATIONS STARTED DURING THIS VISIT:  New Prescriptions   ALBUTEROL (PROVENTIL HFA;VENTOLIN HFA) 108 (90 BASE) MCG/ACT INHALER    Inhale 2 puffs into the lungs every 6 (six) hours as needed for wheezing or shortness of breath.   AZITHROMYCIN (ZITHROMAX) 250 MG TABLET    Take 1 a day for 4 days   PREDNISONE (DELTASONE) 20 MG TABLET    Take 3 tablets (60 mg total) by mouth daily.     Note:  This document was prepared using Dragon voice recognition software and may include unintentional dictation errors.    Rudene Re, MD 05/17/17 2141

## 2017-05-17 NOTE — ED Notes (Signed)
Patient transported to X-ray 

## 2017-05-17 NOTE — Discharge Instructions (Signed)

## 2017-05-17 NOTE — ED Triage Notes (Signed)
Patient presents to the ED with increasing chest pain and shortness of breath x 2 weeks.  Patient reports having difficulty sleeping at night and feeling scared due to increased chest pain.  Patient is tearful in triage.  Patient reports feeling exhausted and scared.  Patient reports history of copd and htn.  Patient reports dyspnea on exertion.  Patient reports productive cough with clear sputum and nasal congestion with clear nasal drainage.

## 2017-06-26 ENCOUNTER — Other Ambulatory Visit: Payer: Self-pay

## 2017-06-26 ENCOUNTER — Emergency Department
Admission: EM | Admit: 2017-06-26 | Discharge: 2017-06-27 | Disposition: A | Discharge status: Home / Self Care | Payer: PRIVATE HEALTH INSURANCE | Attending: Emergency Medicine | Admitting: Emergency Medicine

## 2017-06-26 ENCOUNTER — Emergency Department: Payer: PRIVATE HEALTH INSURANCE

## 2017-06-26 ENCOUNTER — Encounter: Payer: Self-pay | Admitting: Emergency Medicine

## 2017-06-26 DIAGNOSIS — J209 Acute bronchitis, unspecified: Secondary | ICD-10-CM | POA: Diagnosis not present

## 2017-06-26 DIAGNOSIS — I10 Essential (primary) hypertension: Secondary | ICD-10-CM | POA: Diagnosis not present

## 2017-06-26 DIAGNOSIS — J45909 Unspecified asthma, uncomplicated: Secondary | ICD-10-CM | POA: Diagnosis not present

## 2017-06-26 DIAGNOSIS — J449 Chronic obstructive pulmonary disease, unspecified: Secondary | ICD-10-CM | POA: Insufficient documentation

## 2017-06-26 DIAGNOSIS — Z87891 Personal history of nicotine dependence: Secondary | ICD-10-CM | POA: Insufficient documentation

## 2017-06-26 DIAGNOSIS — Z9049 Acquired absence of other specified parts of digestive tract: Secondary | ICD-10-CM | POA: Insufficient documentation

## 2017-06-26 DIAGNOSIS — R0600 Dyspnea, unspecified: Secondary | ICD-10-CM | POA: Diagnosis present

## 2017-06-26 DIAGNOSIS — Z79899 Other long term (current) drug therapy: Secondary | ICD-10-CM | POA: Diagnosis not present

## 2017-06-26 LAB — CBC
HCT: 37.9 % (ref 35.0–47.0)
Hemoglobin: 12.6 g/dL (ref 12.0–16.0)
MCH: 26.4 pg (ref 26.0–34.0)
MCHC: 33.3 g/dL (ref 32.0–36.0)
MCV: 79.1 fL — AB (ref 80.0–100.0)
PLATELETS: 381 10*3/uL (ref 150–440)
RBC: 4.79 MIL/uL (ref 3.80–5.20)
RDW: 16.6 % — AB (ref 11.5–14.5)
WBC: 10.7 10*3/uL (ref 3.6–11.0)

## 2017-06-26 LAB — BASIC METABOLIC PANEL
ANION GAP: 6 (ref 5–15)
BUN: 10 mg/dL (ref 6–20)
CHLORIDE: 109 mmol/L (ref 101–111)
CO2: 24 mmol/L (ref 22–32)
Calcium: 9.1 mg/dL (ref 8.9–10.3)
Creatinine, Ser: 0.89 mg/dL (ref 0.44–1.00)
GFR calc non Af Amer: >60 mL/min (ref >60)
Glucose, Bld: 100 mg/dL — ABNORMAL HIGH (ref 65–99)
POTASSIUM: 3.4 mmol/L — AB (ref 3.5–5.1)
Sodium: 139 mmol/L (ref 135–145)

## 2017-06-26 LAB — TROPONIN I

## 2017-06-26 IMAGING — CR DG CHEST 2V
2 series · 2 of 2 positions shown · non-contrast
Comparison: [DATE] chest radiograph

CLINICAL DATA: 56 y/o F; shortness of breath. History of asthma and
COPD.

EXAM:
CHEST  2 VIEW

[chest lat]
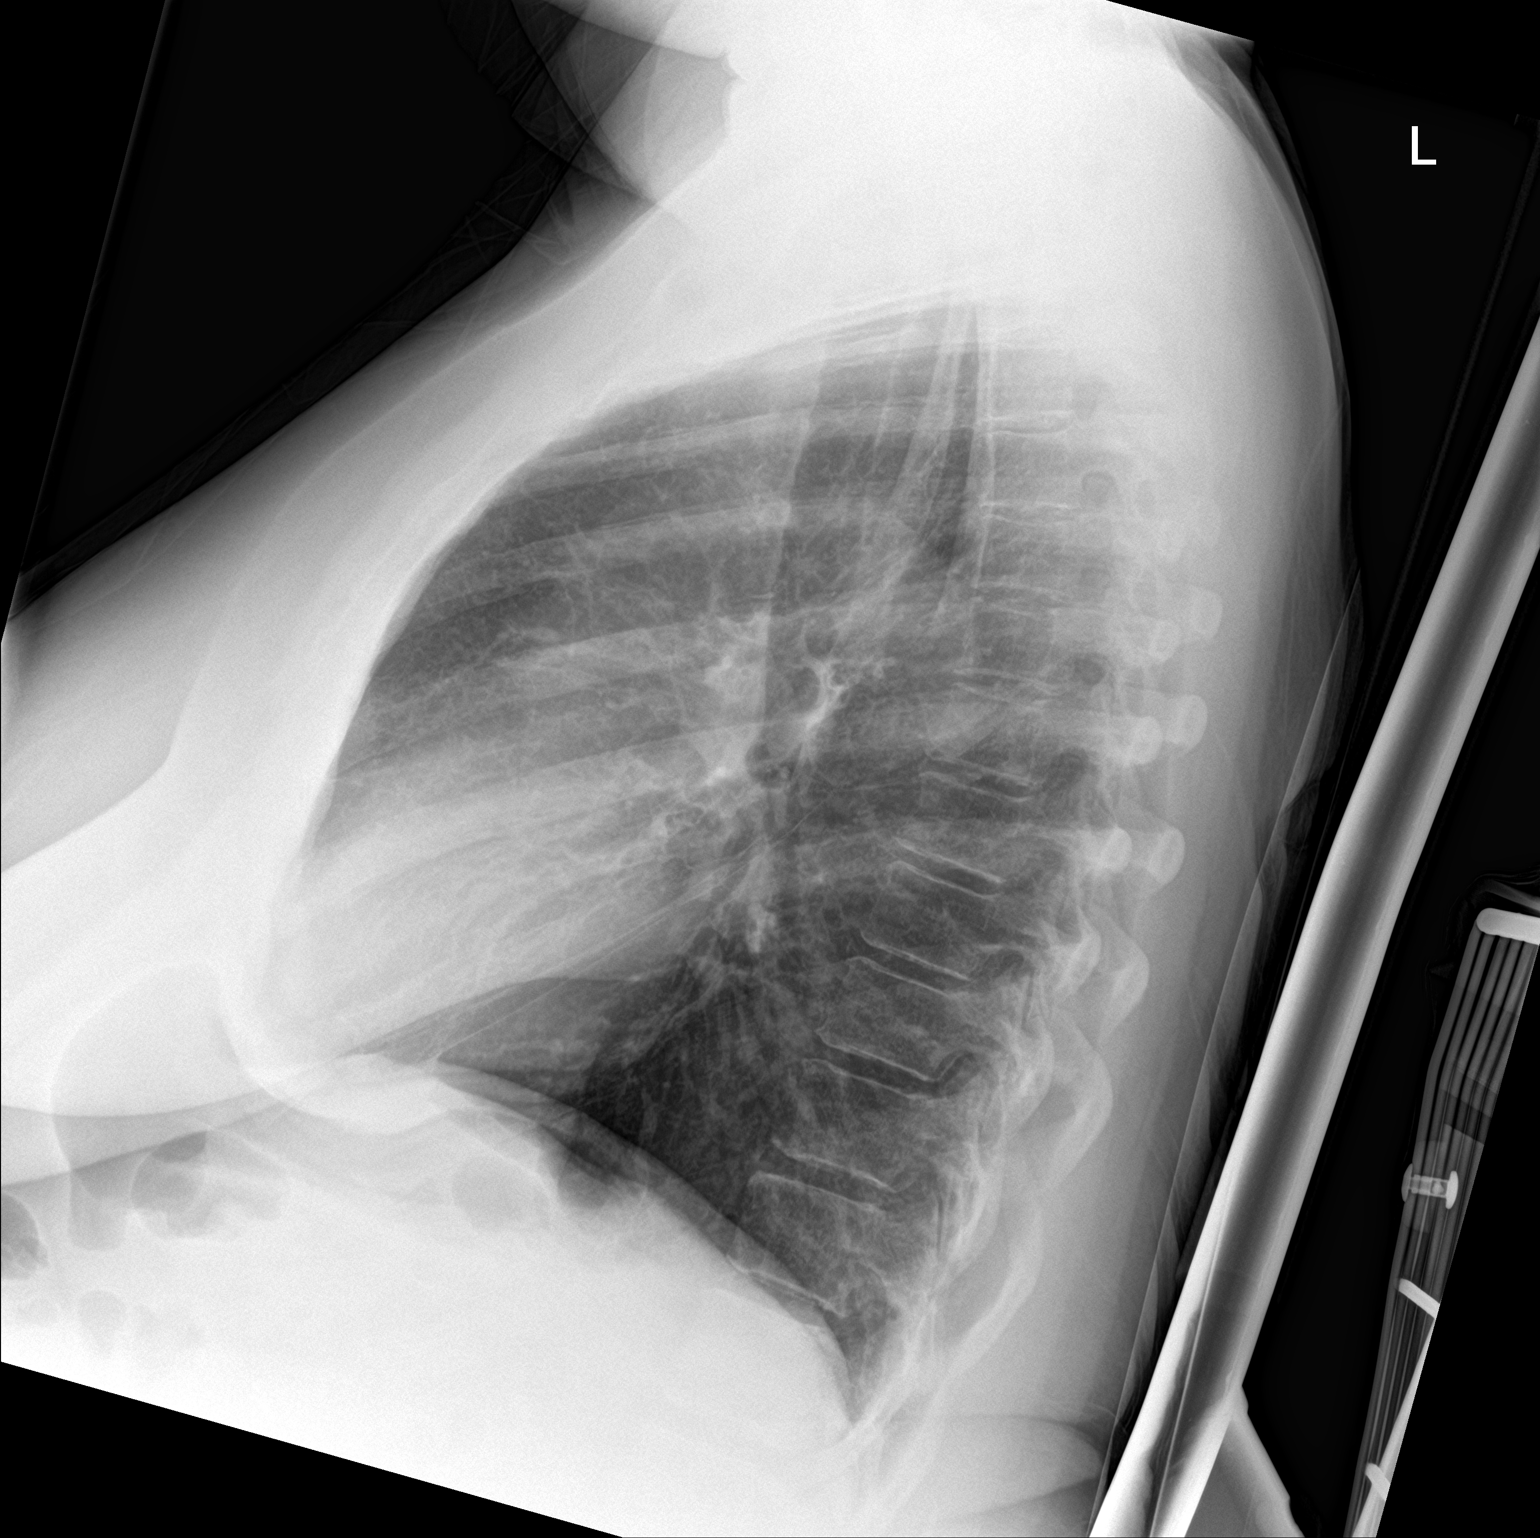

[chest ap]
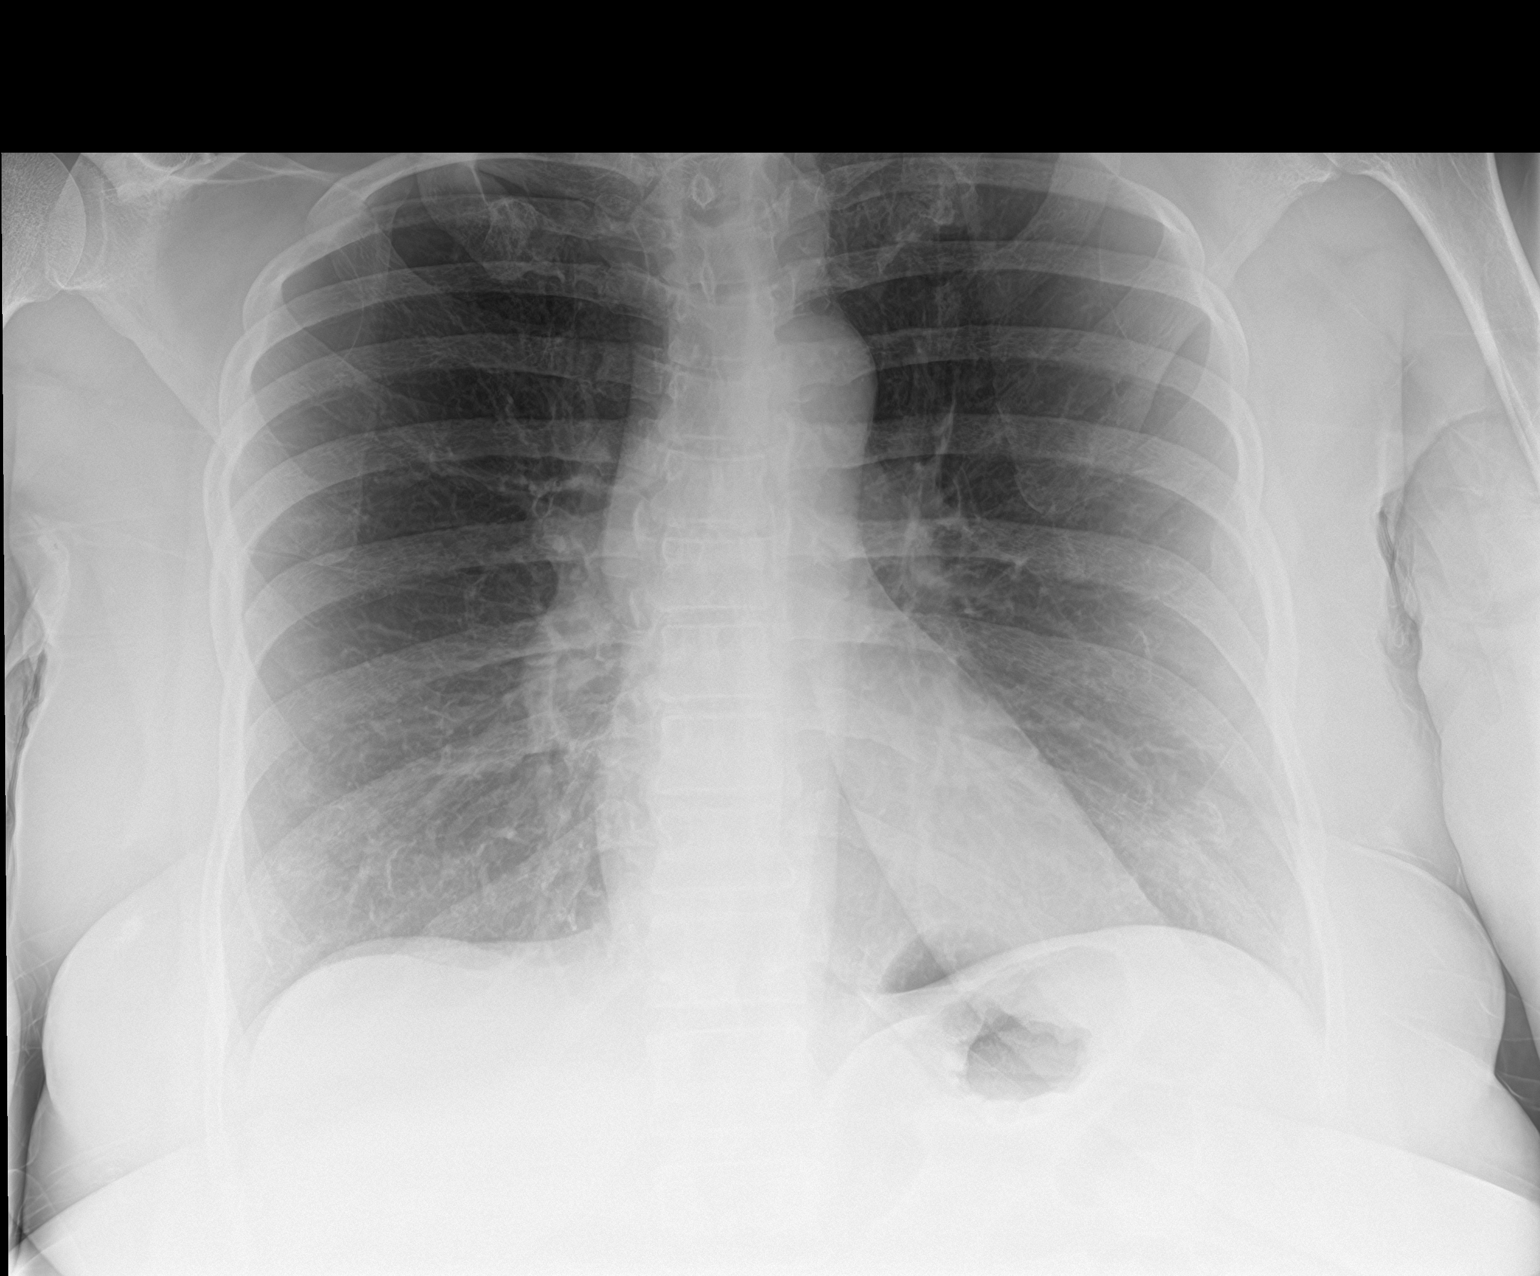

[2 of 2 positions shown; findings below may reference images not displayed]

FINDINGS: Stable heart size and mediastinal contours are within normal limits.
Both lungs are clear. The visualized skeletal structures are
unremarkable.
IMPRESSION: No active cardiopulmonary disease.

By: DEVIKA M.D.

## 2017-06-26 MED ORDER — IBUPROFEN 600 MG PO TABS
600.0000 mg | ORAL_TABLET | Freq: Once | ORAL | Status: AC
  Administered 2017-06-26: 600 mg via ORAL

## 2017-06-26 MED ORDER — IPRATROPIUM-ALBUTEROL 0.5-2.5 (3) MG/3ML IN SOLN
3.0000 mL | Freq: Once | RESPIRATORY_TRACT | Status: AC
  Administered 2017-06-26: 3 mL via RESPIRATORY_TRACT

## 2017-06-26 MED ORDER — IBUPROFEN 600 MG PO TABS
ORAL_TABLET | ORAL | Status: AC
  Administered 2017-06-26: 600 mg via ORAL
  Filled 2017-06-26: qty 1

## 2017-06-26 MED ORDER — METHYLPREDNISOLONE SODIUM SUCC 125 MG IJ SOLR
INTRAMUSCULAR | Status: AC
  Filled 2017-06-26: qty 2

## 2017-06-26 MED ORDER — METHYLPREDNISOLONE SODIUM SUCC 125 MG IJ SOLR
125.0000 mg | Freq: Once | INTRAMUSCULAR | Status: AC
  Administered 2017-06-26: 125 mg via INTRAVENOUS

## 2017-06-26 MED ORDER — IPRATROPIUM-ALBUTEROL 0.5-2.5 (3) MG/3ML IN SOLN
RESPIRATORY_TRACT | Status: AC
  Administered 2017-06-26: 3 mL via RESPIRATORY_TRACT
  Filled 2017-06-26: qty 3

## 2017-06-26 MED ORDER — AZITHROMYCIN 500 MG PO TABS
500.0000 mg | ORAL_TABLET | Freq: Once | ORAL | Status: AC
  Administered 2017-06-26: 500 mg via ORAL
  Filled 2017-06-26: qty 1

## 2017-06-26 MED ORDER — IPRATROPIUM-ALBUTEROL 0.5-2.5 (3) MG/3ML IN SOLN
RESPIRATORY_TRACT | Status: AC
  Filled 2017-06-26: qty 6

## 2017-06-26 NOTE — ED Provider Notes (Signed)
[facility] Emergency Department Provider Note    First MD Initiated Contact with Patient 06/26/17 2318     (approximate)  I have reviewed the triage vital signs and the nursing notes.   HISTORY  Chief Complaint Shortness of Breath   HPI Cathy Hughes is a 56 y.o. female bolus of chronic medical conditions including asthma and COPD presents to the emergency department with 1 day history of progressive dyspnea, productive cough wheezing and chills.patient admits to previous tobacco use however stated that she quit 1 month ago.   Past Medical History:  Diagnosis Date  . Asthma   . COPD (chronic obstructive pulmonary disease) ([place])   . Hypertension     There are no active problems to display for this patient.   Past Surgical History:  Procedure Laterality Date  . CHOLECYSTECTOMY    . COLON SURGERY    . thumb surgery      Prior to Admission medications   Medication Sig Start Date End Date Taking? Authorizing Provider  albuterol (PROVENTIL HFA;VENTOLIN HFA) 108 (90 Base) MCG/ACT inhaler Inhale 2 puffs into the lungs every 6 (six) hours as needed for wheezing or shortness of breath. 10/07/16   Schuyler Amor, MD  albuterol (PROVENTIL HFA;VENTOLIN HFA) 108 (90 Base) MCG/ACT inhaler Inhale 2 puffs into the lungs every 6 (six) hours as needed for wheezing or shortness of breath. 05/17/17   Rudene Re, MD  azithromycin [facility]) 250 MG tablet Take 1 a day for 4 days 05/17/17   Alfred Levins, [place], MD  lisinopril-hydrochlorothiazide (PRINZIDE,ZESTORETIC) 10-12.5 MG per tablet Take 1 tablet by mouth daily.  11/25/13   [provider]    Allergies Aspirin  Family History  Problem Relation Age of Onset  . Diabetes Mother   . Hypertension Mother   . Diabetes Father   . Hypertension Father     Social History Social History  Substance Use Topics  . Smoking status: Former Smoker    Packs/day: 0.50    Types: Cigarettes    Quit date:  05/16/2017  . Smokeless tobacco: Never Used  . Alcohol use Yes     Comment: occ    Review of Systems Constitutional: No fever/chills Eyes: No visual changes. ENT: No sore throat. Cardiovascular: Denies chest pain. Respiratory: Positive for shortness of breath. Gastrointestinal: No abdominal pain.  No nausea, no vomiting.  No diarrhea.  No constipation. Genitourinary: Negative for dysuria. Musculoskeletal: Negative for neck pain.  Negative for back pain. Integumentary: Negative for rash. Neurological: Negative for headaches, focal weakness or numbness.  ____________________________________________   PHYSICAL EXAM:  VITAL SIGNS: ED Triage Vitals  Enc Vitals Group     BP 06/26/17 2134 (!) 173/64     Pulse Rate 06/26/17 2134 (!) 101     Resp 06/26/17 2134 (!) 22     Temp 06/26/17 2134 99.5 F (37.5 C)     Temp src --      SpO2 06/26/17 2134 95 %     Weight 06/26/17 2135 79.4 kg (175 lb)     Height 06/26/17 2135 1.651 m (5\' 5" )     Head Circumference --      Peak Flow --      Pain Score 06/26/17 2135 10     Pain Loc --      Pain Edu? --      Excl. in [place]? --     Constitutional: Alert and oriented. Well appearing and in no acute distress. Eyes: Conjunctivae are  normal. PERRL. EOMI. Head: Atraumatic. Ears:  Healthy appearing ear canals and TMs bilaterally Nose: No congestion/rhinnorhea. Mouth/Throat: Mucous membranes are moist.  Oropharynx non-erythematous. Neck: No stridor.   Cardiovascular: Normal rate, regular rhythm. Good peripheral circulation. Grossly normal heart sounds. Respiratory: Normal respiratory effort.  No retractions. Lungs CTAB. Gastrointestinal: Soft and nontender. No distention.  Musculoskeletal: No lower extremity tenderness nor edema. No gross deformities of extremities. Neurologic:  Normal speech and language. No gross focal neurologic deficits are appreciated.  Skin:  Skin is warm, dry and intact. No rash noted. Psychiatric: Mood and affect are  normal. Speech and behavior are normal.  ____________________________________________   LABS (all labs ordered are listed, but only abnormal results are displayed)  Labs Reviewed  BASIC METABOLIC PANEL - Abnormal; Notable for the following:       Result Value   Potassium 3.4 (*)    Glucose, Bld 100 (*)    All other components within normal limits  CBC - Abnormal; Notable for the following:    MCV 79.1 (*)    RDW 16.6 (*)    All other components within normal limits  TROPONIN I   ____________________________________________  EKG  ED ECG REPORT I, [place] N Johnsey, the attending physician, personally viewed and interpreted this ECG.   Date: 06/26/2017  EKG Time: 9:41 PM  Rate: 99  Rhythm: normal sinus rhythm was  Axis: normal  Intervals:normal   ST&T Change: none   RADIOLOGY I,  N Raczynski, personally viewed and evaluated these images (plain radiographs) as part of my medical decision making, as well as reviewing the written report by the radiologist.  Dg Chest 2 View  Result Date: 06/26/2017 CLINICAL DATA:  56 y/o F; shortness of breath. History of asthma and COPD. EXAM: CHEST  2 VIEW COMPARISON:  05/17/2017 chest radiograph FINDINGS: Stable heart size and mediastinal contours are within normal limits. Both lungs are clear. The visualized skeletal structures are unremarkable. IMPRESSION: No active cardiopulmonary disease. Electronically Signed   By: Kristine Garbe M.D.   On: 06/26/2017 22:12    ____________________________________________    Procedures   ____________________________________________   INITIAL IMPRESSION / ASSESSMENT AND PLAN / ED COURSE  As part of my medical decision making, I reviewed the following data within the electronic MEDICAL RECORD NUMBER 56 year old female presenting to the emergency department with above stated history of physical exam concern for possible acute on chronic bronchitis versus pneumonia. Chest x-ray revealed no  evidence of pneumonia and a such suspect acute bronchitis. Patient givenazithromycin 500 mg DuoNeb's 2 and Solu-Medrol 125 mg IV with resolution of symptoms. Patient be prescribed azithromycin and prednisone for home as well as Tessalon Perles.     ____________________________________________  FINAL CLINICAL IMPRESSION(S) / ED DIAGNOSES  Final diagnoses:  Acute bronchitis, unspecified organism     MEDICATIONS GIVEN DURING THIS VISIT:  Medications  ipratropium-albuterol (DUONEB) 0.5-2.5 (3) MG/3ML nebulizer solution 3 mL (3 mLs Nebulization Given 06/26/17 2200)     NEW OUTPATIENT MEDICATIONS STARTED DURING THIS VISIT:  New Prescriptions   No medications on file    Modified Medications   No medications on file    Discontinued Medications   No medications on file     Note:  This document was prepared using Dragon voice recognition software and may include unintentional dictation errors.    Gregor Hams, MD 06/27/17 [phone]

## 2017-06-26 NOTE — ED Triage Notes (Signed)
Patient with complaint of shortness of breath that started last night. Patient has a history of asthma and COPD. Patient states that she used her inhaler at home with no improvement.  Patient with expiratory wheezes.

## 2017-06-27 MED ORDER — PREDNISONE 20 MG PO TABS: 60.0000 mg | ORAL_TABLET | Freq: Every day | ORAL | 0 refills | Status: AC

## 2017-06-27 MED ORDER — AMLODIPINE BESYLATE 5 MG PO TABS
5.0000 mg | ORAL_TABLET | Freq: Once | ORAL | Status: AC
  Administered 2017-06-27: 5 mg via ORAL

## 2017-06-27 MED ORDER — HYDROCOD POLST-CPM POLST ER 10-8 MG/5ML PO SUER
5.0000 mL | Freq: Once | ORAL | Status: AC
  Administered 2017-06-27: 5 mL via ORAL
  Filled 2017-06-27: qty 5

## 2017-06-27 MED ORDER — AMLODIPINE BESYLATE 5 MG PO TABS
ORAL_TABLET | ORAL | Status: AC
  Filled 2017-06-27: qty 1

## 2017-06-27 MED ORDER — BENZONATATE 100 MG PO CAPS: 100.0000 mg | ORAL_CAPSULE | Freq: Three times a day (TID) | ORAL | 0 refills | Status: DC | PRN

## 2017-06-27 MED ORDER — AZITHROMYCIN 500 MG PO TABS: 500.0000 mg | ORAL_TABLET | Freq: Every day | ORAL | 0 refills | Status: AC

## 2017-06-27 MED ORDER — AMLODIPINE BESYLATE 5 MG PO TABS: 5.0000 mg | ORAL_TABLET | Freq: Every day | ORAL | 0 refills | Status: DC

## 2017-06-27 NOTE — ED Notes (Addendum)
Pt coughing and having to spit up phlegm after going to [facility], EDP notified, see MAR for follow up. Pt reports she has a friend that will be able to pick her up for DC.

## 2017-06-27 NOTE — ED Notes (Signed)
Patient is resting comfortably. Breathing has improved.

## 2017-06-27 NOTE — ED Notes (Signed)
In to d/c pt, but BP is high. Pt is out of her BP meds. Dr. Owens Shark notified. New order received.

## 2018-04-15 ENCOUNTER — Encounter: Payer: Self-pay | Admitting: Intensive Care

## 2018-04-15 ENCOUNTER — Emergency Department
Admission: EM | Admit: 2018-04-15 | Discharge: 2018-04-15 | Disposition: A | Discharge status: Home / Self Care | Payer: No Typology Code available for payment source | Attending: Emergency Medicine | Admitting: Emergency Medicine

## 2018-04-15 ENCOUNTER — Emergency Department: Payer: No Typology Code available for payment source

## 2018-04-15 ENCOUNTER — Other Ambulatory Visit: Payer: Self-pay

## 2018-04-15 DIAGNOSIS — Z79899 Other long term (current) drug therapy: Secondary | ICD-10-CM | POA: Insufficient documentation

## 2018-04-15 DIAGNOSIS — M5441 Lumbago with sciatica, right side: Secondary | ICD-10-CM | POA: Insufficient documentation

## 2018-04-15 DIAGNOSIS — J449 Chronic obstructive pulmonary disease, unspecified: Secondary | ICD-10-CM | POA: Insufficient documentation

## 2018-04-15 DIAGNOSIS — I10 Essential (primary) hypertension: Secondary | ICD-10-CM | POA: Insufficient documentation

## 2018-04-15 DIAGNOSIS — F17210 Nicotine dependence, cigarettes, uncomplicated: Secondary | ICD-10-CM | POA: Insufficient documentation

## 2018-04-15 IMAGING — CR DG HIP (WITH OR WITHOUT PELVIS) 2-3V*R*
3 series · 3 of 3 positions shown · non-contrast
Comparison: No recent studies in PACs

CLINICAL DATA: Right-sided thigh pain for the past 2 weeks with no
known injury.

EXAM:
DG HIP (WITH OR WITHOUT PELVIS) 2-3V RIGHT

[pelvis ap]
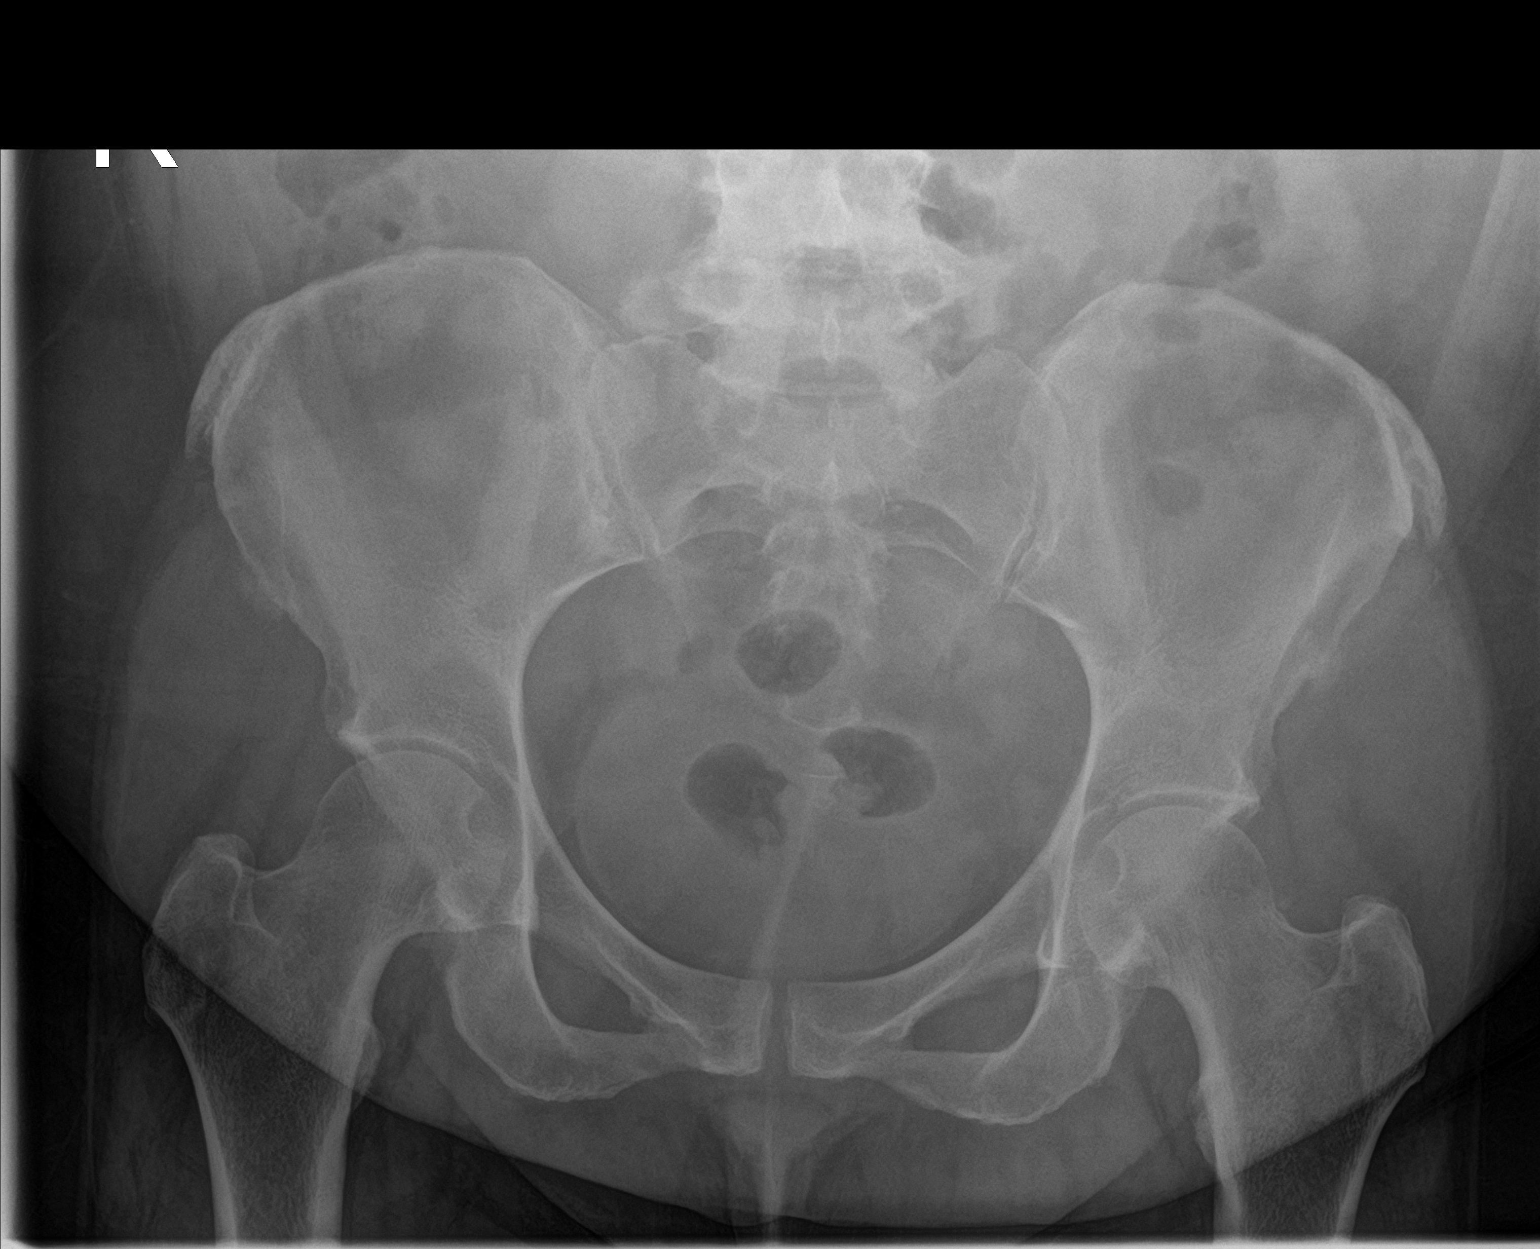

[hip ap]
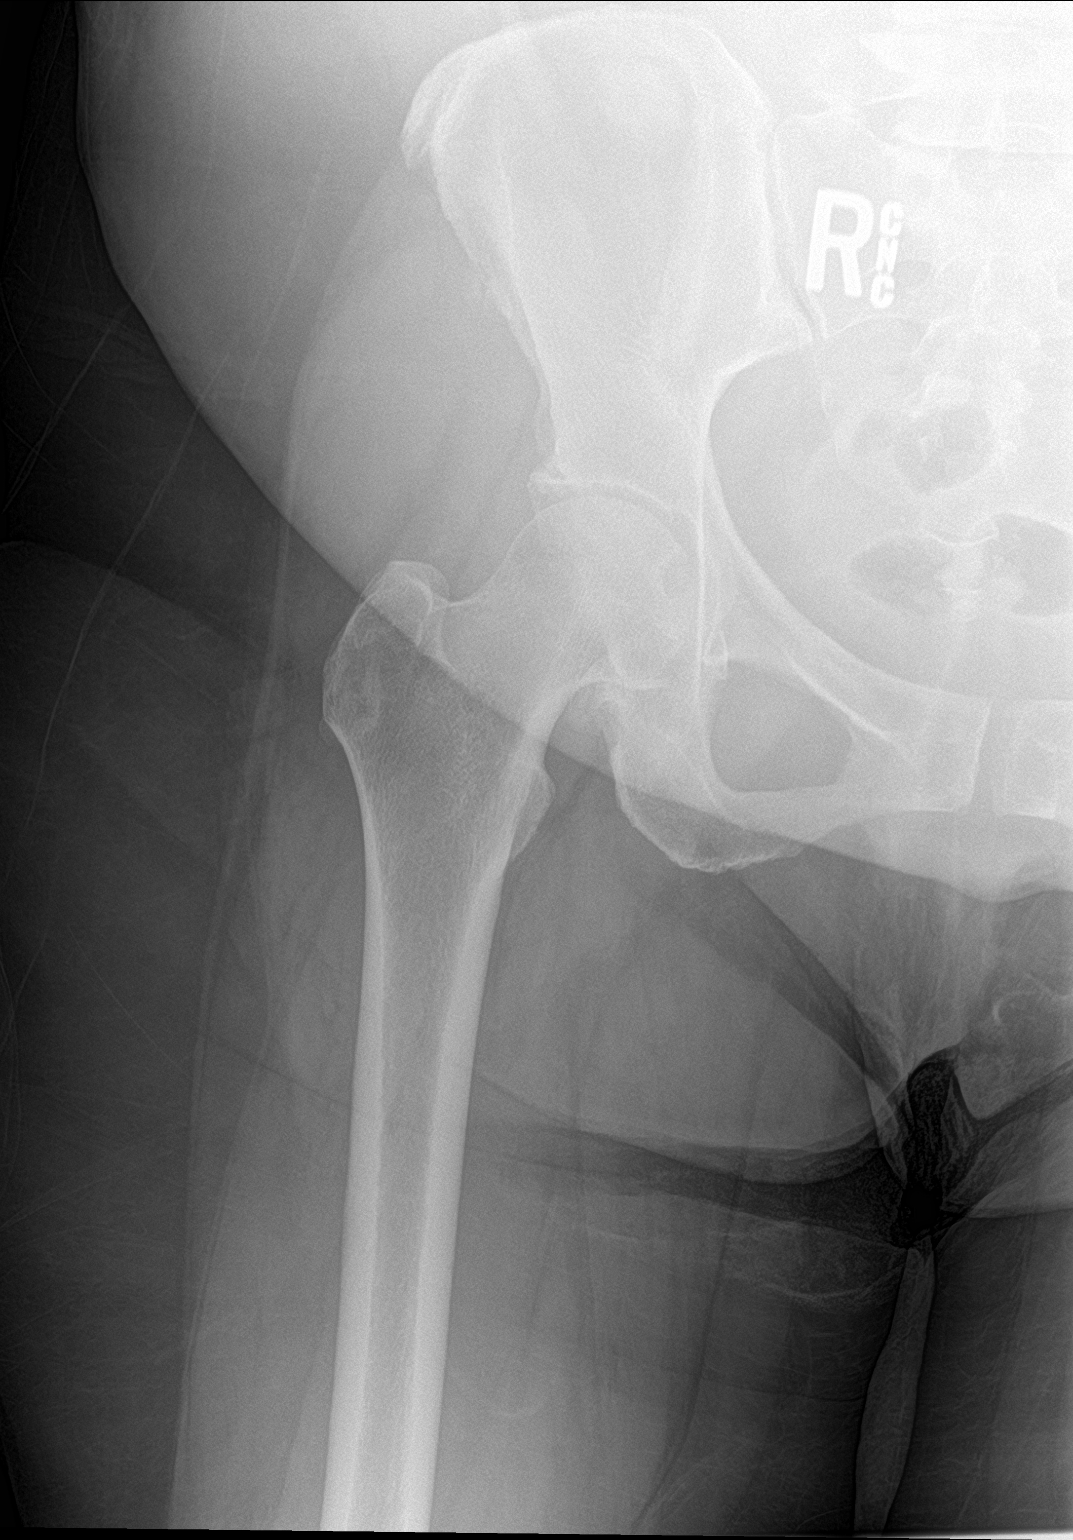

[hip lat]
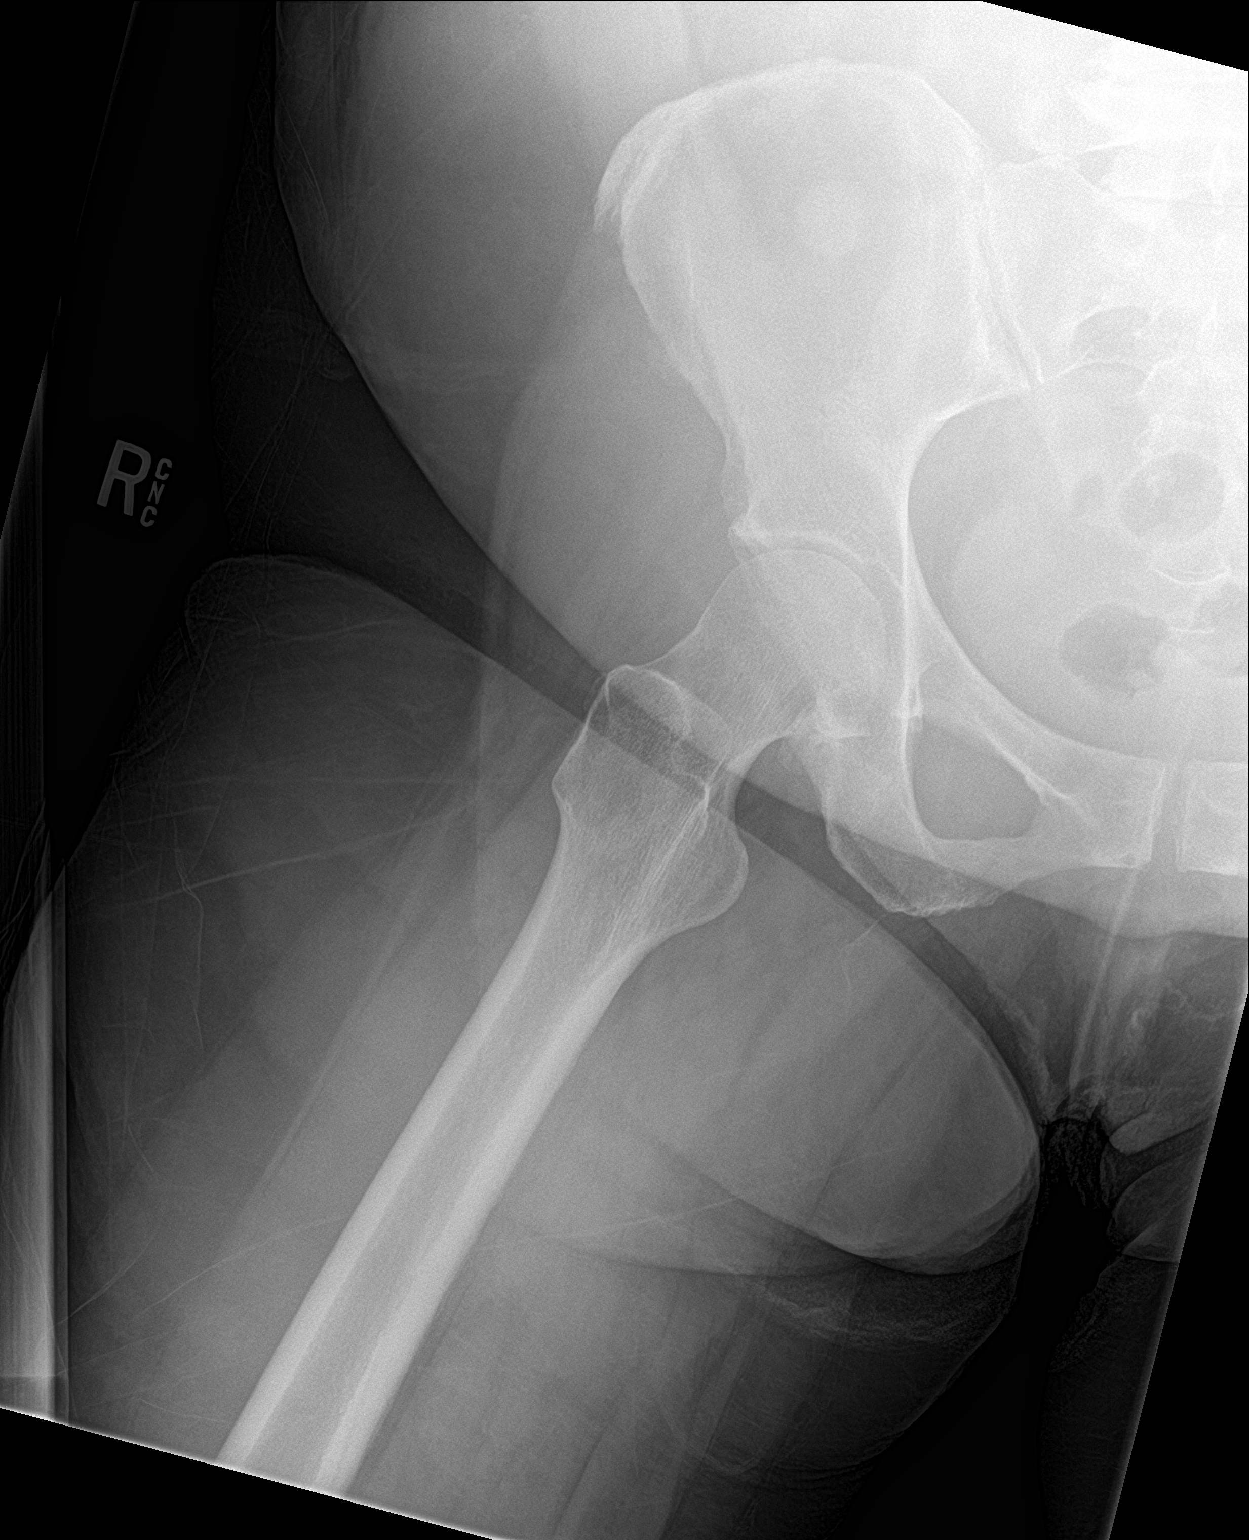

[3 of 3 positions shown; findings below may reference images not displayed]

FINDINGS: The bony pelvis is subjectively adequately mineralized. There is no
lytic nor blastic lesion nor acute or old pelvic fracture. AP and
lateral views of the right hip reveal preservation of the joint
space. The articular surfaces of the right femoral head and
acetabulum remains smoothly rounded. The femoral neck,
intertrochanteric, and immediate subtrochanteric regions are normal.
IMPRESSION: There is no acute or significant chronic bony abnormality of the
right hip.

## 2018-04-15 IMAGING — CR DG LUMBAR SPINE 2-3V
3 series · 3 of 3 positions shown · non-contrast
Comparison: No recent studies in PACs

CLINICAL DATA: Right-sided thigh pain for the past 2 weeks with no
known injury. History of asthma-COPD. Daily smoker.

EXAM:
LUMBAR SPINE - 2-3 VIEW

[l-spine ap]
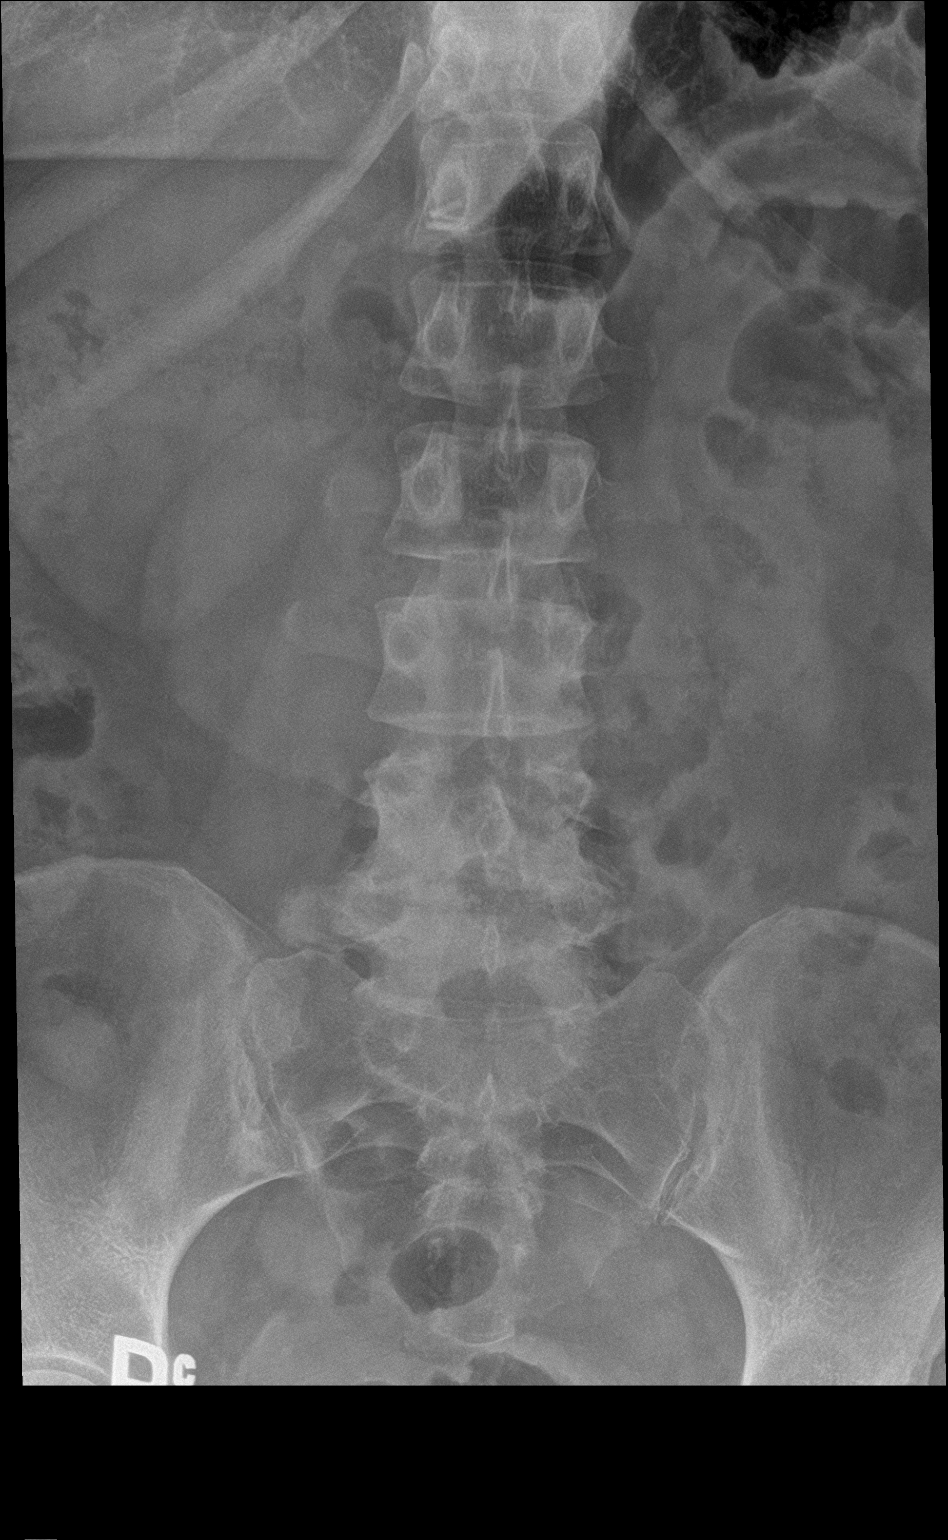

[l-spine lat]
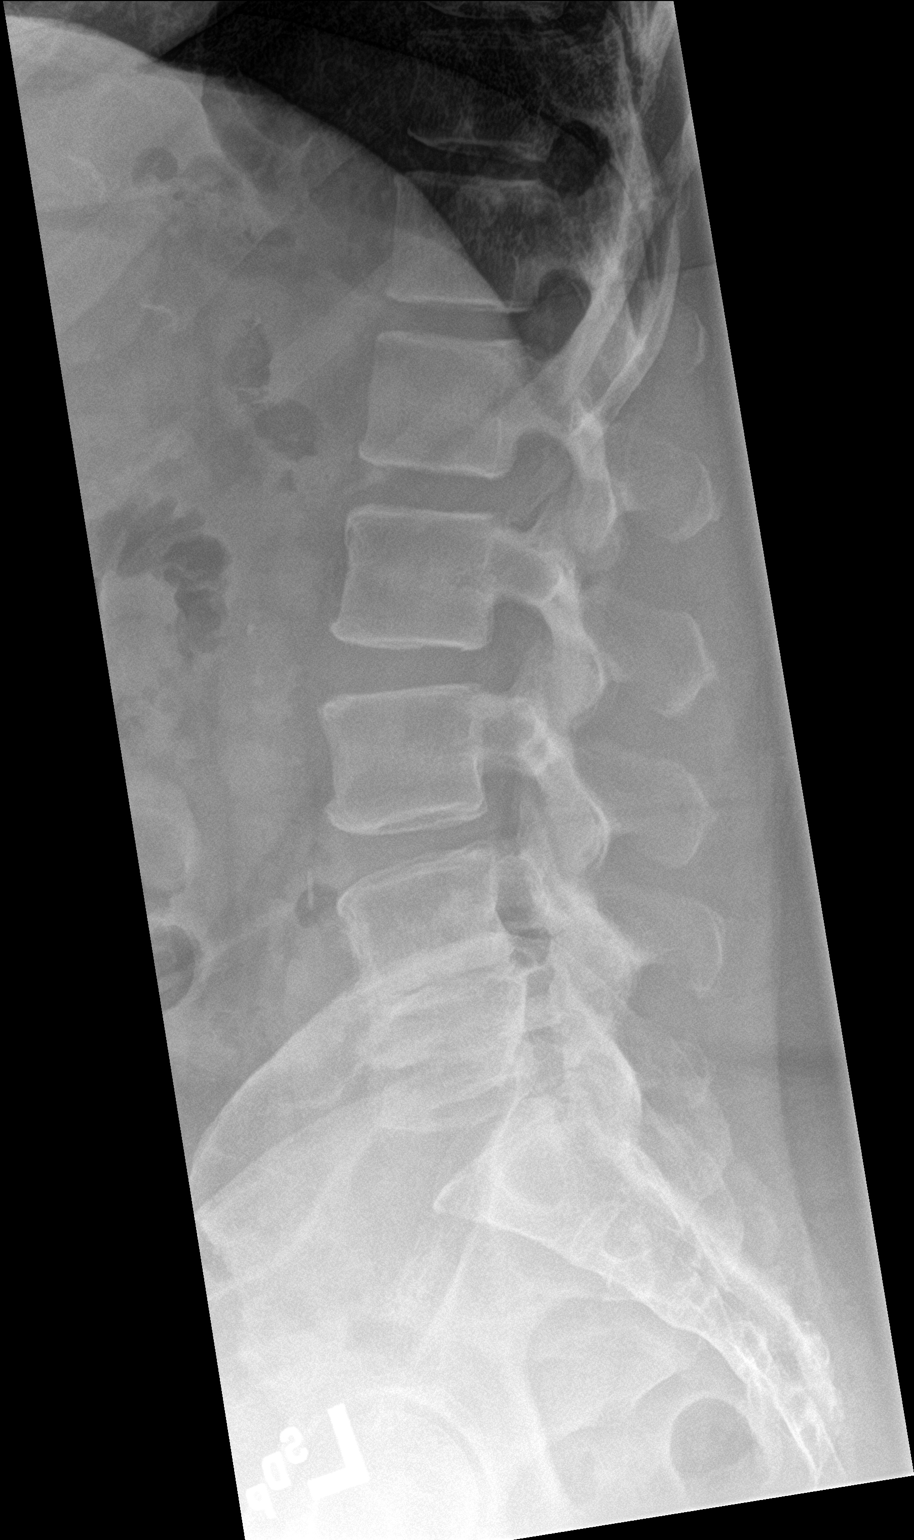

[l-spine spot]
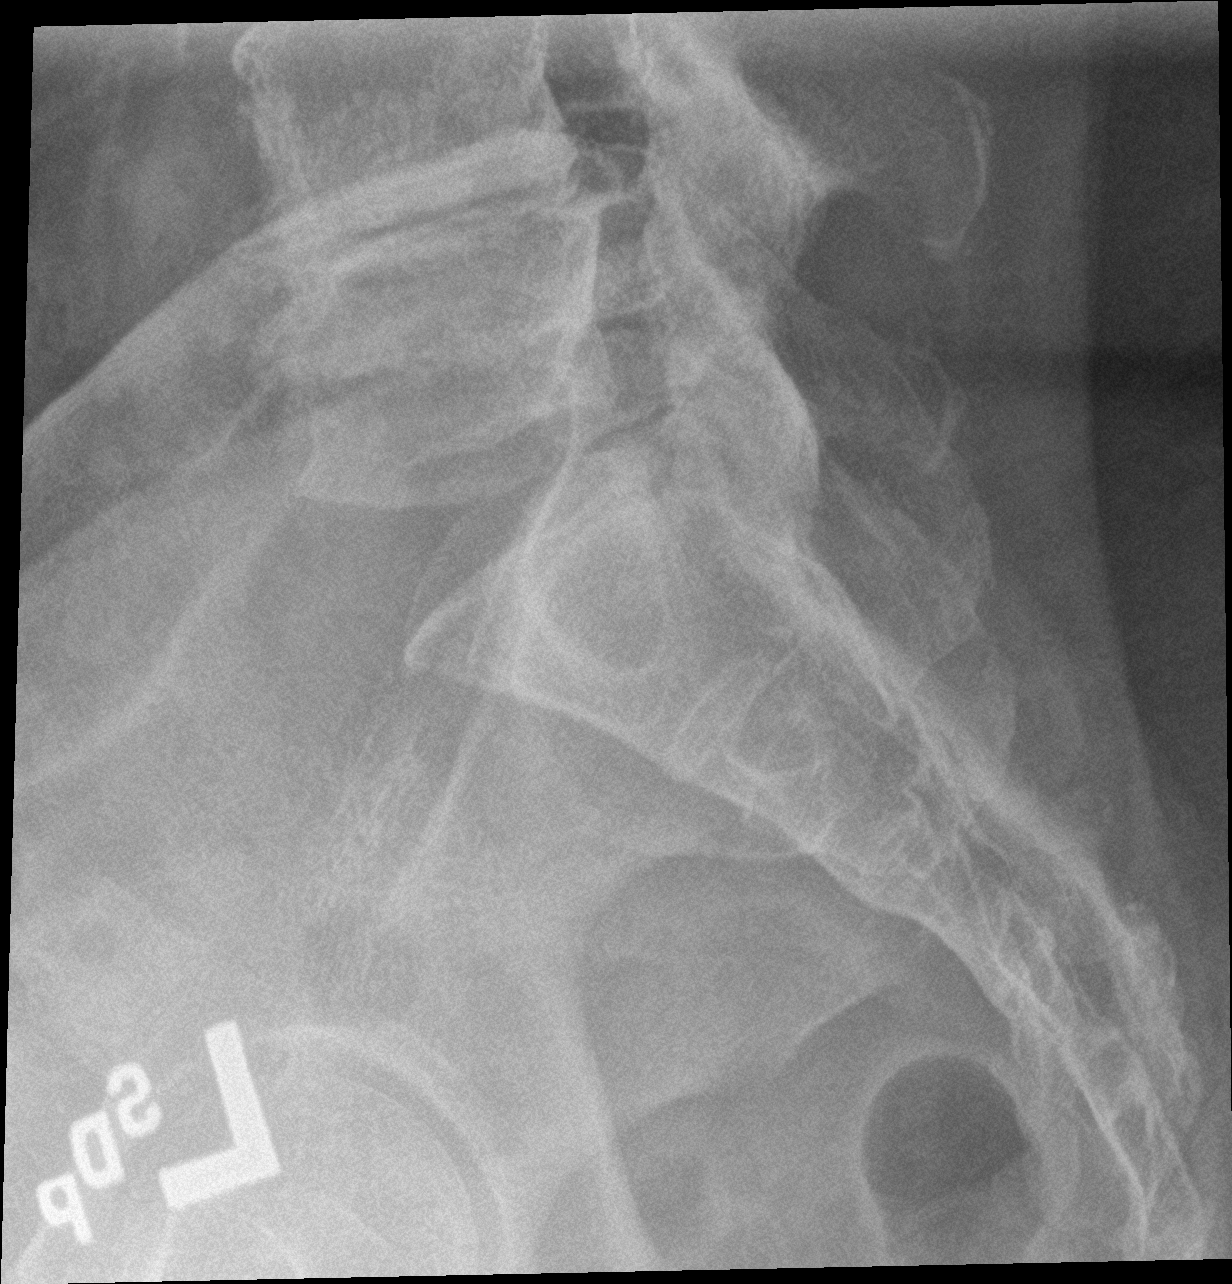

[3 of 3 positions shown; findings below may reference images not displayed]

FINDINGS: The lumbar vertebral bodies are preserved in height. There is
moderate disc space narrowing at L4-5. There is no
spondylolisthesis. There is facet joint hypertrophy at L4-5. The
pedicles and transverse processes are grossly normal.
IMPRESSION: Moderate degenerative disc disease at L4-5 with mild facet joint
hypertrophy at the same level. No compression fracture or
spondylolisthesis.

## 2018-04-15 MED ORDER — PREDNISONE 10 MG PO TABS: ORAL_TABLET | ORAL | 0 refills | Status: DC

## 2018-04-15 NOTE — Discharge Instructions (Addendum)
Follow-up with your primary care provider at [facility] if any continued problems.  Begin taking prednisone as directed.  You may also put moist heat or ice to your low back as needed for discomfort.

## 2018-04-15 NOTE — ED Notes (Signed)
.  FIRST NURSE NOTE: Pt presents for leg pain. Pt states pain has been there for 2 weeks. Pt is NAD and ambulatory.

## 2018-04-15 NOTE — ED Notes (Signed)
See triage note  Presents with pain to right hip and into right leg  Describes asa "pins and needles"  Denies any recent injury  Ambulates well

## 2018-04-15 NOTE — ED Triage Notes (Signed)
Patient states "My right thigh has been hurting me for two weeks and its gotten worse and I cannot take the pain any longer. It feels like pins and needles and numb" ambulatory into triage room with no problems

## 2018-04-15 NOTE — ED Provider Notes (Signed)
[facility] Emergency Department Provider Note  ____________________________________________   First MD Initiated Contact with Patient 04/15/18 1153     (approximate)  I have reviewed the triage vital signs and the nursing notes.   HISTORY  Chief Complaint Leg Pain (right thigh)   HPI Cathy Hughes is a 57 y.o. female presents to the ED with complaint of right thigh pain that has been hurting for approximately 2 weeks.  Patient states that she has a numbness sensation on the lateral aspect of her right thigh.  She denies any recent injury.  She states that she has pain in her right hip which radiates down to her mid thigh.  She denies any urinary symptoms or past history of back problems.  Patient has continued to ambulate without any problems other than pain.  She denies any posterior leg pain or calf pain.  There is been no history of DVT in the past.  She denies any recent travel.  Currently she rates her pain as 10/10.   Past Medical History:  Diagnosis Date  . Asthma   . COPD (chronic obstructive pulmonary disease) ([place])   . Hypertension     There are no active problems to display for this patient.   Past Surgical History:  Procedure Laterality Date  . CHOLECYSTECTOMY    . COLON SURGERY    . thumb surgery      Prior to Admission medications   Medication Sig Start Date End Date Taking? Authorizing Provider  albuterol (PROVENTIL HFA;VENTOLIN HFA) 108 (90 Base) MCG/ACT inhaler Inhale 2 puffs into the lungs every 6 (six) hours as needed for wheezing or shortness of breath. 10/07/16   Schuyler Amor, MD  albuterol (PROVENTIL HFA;VENTOLIN HFA) 108 (90 Base) MCG/ACT inhaler Inhale 2 puffs into the lungs every 6 (six) hours as needed for wheezing or shortness of breath. 05/17/17   Rudene Re, MD  amLODipine (NORVASC) 5 MG tablet Take 1 tablet (5 mg total) by mouth daily. 06/27/17 06/27/18  Gregor Hams, MD  lisinopril-hydrochlorothiazide  (PRINZIDE,ZESTORETIC) 10-12.5 MG per tablet Take 1 tablet by mouth daily.  11/25/13   [provider]  predniSONE (DELTASONE) 10 MG tablet Take 6 tablets  today, on day 2 take 5 tablets, day 3 take 4 tablets, day 4 take 3 tablets, day 5 take  2 tablets and 1 tablet the last day 04/15/18   Johnn Hai, PA-C    Allergies Aspirin  Family History  Problem Relation Age of Onset  . Diabetes Mother   . Hypertension Mother   . Diabetes Father   . Hypertension Father     Social History Social History   Tobacco Use  . Smoking status: Current Every Day Smoker    Packs/day: 0.50    Types: Cigarettes  . Smokeless tobacco: Never Used  Substance Use Topics  . Alcohol use: Yes    Comment: occ  . Drug use: No    Review of Systems Constitutional: No fever/chills Cardiovascular: Denies chest pain. Respiratory: Denies shortness of breath. Genitourinary: Negative for dysuria. Musculoskeletal: Right hip pain positive with radicular type symptoms. Skin: Negative for rash. Neurological: Negative for  focal weakness.  ___________________________________________   PHYSICAL EXAM:  VITAL SIGNS: ED Triage Vitals  Enc Vitals Group     BP 04/15/18 1136 140/80     Pulse Rate 04/15/18 1134 93     Resp 04/15/18 1134 18     Temp 04/15/18 1134 98.3 F (36.8 C)  Temp Source 04/15/18 1134 Oral     SpO2 04/15/18 1134 95 %     Weight 04/15/18 1135 185 lb (83.9 kg)     Height 04/15/18 1135 5\' 5"  (1.651 m)     Head Circumference --      Peak Flow --      Pain Score 04/15/18 1135 10     Pain Loc --      Pain Edu? --      Excl. in [place]? --    Constitutional: Alert and oriented. Well appearing and in no acute distress. Eyes: Conjunctivae are normal.  Head: Atraumatic. Nose: No congestion/rhinnorhea. Mouth/Throat: Mucous membranes are moist.  Oropharynx non-erythematous. Neck: No stridor.   Cardiovascular: Normal rate, regular rhythm. Grossly normal heart sounds.  Good peripheral  circulation. Respiratory: Normal respiratory effort.  No retractions. Lungs CTAB. Gastrointestinal: Soft and nontender. No distention.  Obese.  No CVA tenderness. Musculoskeletal: There is tenderness on palpation of the lower lumbar spine and right SI joint area.  There is no point tenderness on palpation of the right hip.  Range of motion is minimally restricted secondary to discomfort.  On examination of the right thigh there is no erythema or edema in comparison with the left.  There is no tenderness on palpation of the calf and Homans sign is negative.  Range of motion of the knee is within normal limits.   Neurologic:  Normal speech and language. No gross focal neurologic deficits are appreciated.  Skin:  Skin is warm, dry and intact. No rash noted. Psychiatric: Mood and affect are normal. Speech and behavior are normal.  ____________________________________________   LABS (all labs ordered are listed, but only abnormal results are displayed)  Labs Reviewed - No data to display  RADIOLOGY  ED MD interpretation:  Lumbar spine x-ray is negative for acute changes but moderate degenerative changes are noted.  Right hip x-ray is negative for acute changes.  Official radiology report(s): Dg Lumbar Spine 2-3 Views  Result Date: 04/15/2018 CLINICAL DATA:  Right-sided thigh pain for the past 2 weeks with no known injury. History of asthma-COPD. Daily smoker. EXAM: LUMBAR SPINE - 2-3 VIEW COMPARISON:  No recent studies in [facility] FINDINGS: The lumbar vertebral bodies are preserved in height. There is moderate disc space narrowing at L4-5. There is no spondylolisthesis. There is facet joint hypertrophy at L4-5. The pedicles and transverse processes are grossly normal. IMPRESSION: Moderate degenerative disc disease at L4-5 with mild facet joint hypertrophy at the same level. No compression fracture or spondylolisthesis. Electronically Signed   By: David  Martinique M.D.   On: 04/15/2018 12:55   Dg Hip  Unilat W Or Wo Pelvis 2-3 Views Right  Result Date: 04/15/2018 CLINICAL DATA:  Right-sided thigh pain for the past 2 weeks with no known injury. EXAM: DG HIP (WITH OR WITHOUT PELVIS) 2-3V RIGHT COMPARISON:  No recent studies in [facility] FINDINGS: The bony pelvis is subjectively adequately mineralized. There is no lytic nor blastic lesion nor acute or old pelvic fracture. AP and lateral views of the right hip reveal preservation of the joint space. The articular surfaces of the right femoral head and acetabulum remains smoothly rounded. The femoral neck, intertrochanteric, and immediate subtrochanteric regions are normal. IMPRESSION: There is no acute or significant chronic bony abnormality of the right hip. Electronically Signed   By: David  Martinique M.D.   On: 04/15/2018 12:56  ____________________________________________   PROCEDURES  Procedure(s) performed: None  Procedures  Critical Care performed: No  ____________________________________________  INITIAL IMPRESSION / ASSESSMENT AND PLAN / ED COURSE  As part of my medical decision making, I reviewed the following data within the electronic MEDICAL RECORD NUMBER Notes from prior ED visits and [place] Controlled Substance Database  Patient is to discontinue taking over-the-counter pain medication as she is not getting any relief.  We discussed the changes in her back and most likely this is a right leg sciatica as she describes radicular type pain.  Patient was placed on prednisone as directed.  She is to follow-up with her PCP if any continued problems.  We also discussed ice or heat to her lower back as needed for comfort.   ____________________________________________   FINAL CLINICAL IMPRESSION(S) / ED DIAGNOSES  Final diagnoses:  Acute right-sided low back pain with right-sided sciatica     ED Discharge Orders        Ordered    predniSONE (DELTASONE) 10 MG tablet     04/15/18 1313       Note:  This document was prepared using Dragon  voice recognition software and may include unintentional dictation errors.    Johnn Hai, PA-C 04/15/18 1524    Carrie Mew, MD 04/16/18 2044

## 2018-06-03 ENCOUNTER — Encounter: Payer: Self-pay | Admitting: Emergency Medicine

## 2018-06-03 ENCOUNTER — Other Ambulatory Visit: Payer: Self-pay

## 2018-06-03 DIAGNOSIS — J45909 Unspecified asthma, uncomplicated: Secondary | ICD-10-CM | POA: Insufficient documentation

## 2018-06-03 DIAGNOSIS — Z79899 Other long term (current) drug therapy: Secondary | ICD-10-CM | POA: Insufficient documentation

## 2018-06-03 DIAGNOSIS — F17210 Nicotine dependence, cigarettes, uncomplicated: Secondary | ICD-10-CM | POA: Insufficient documentation

## 2018-06-03 DIAGNOSIS — J441 Chronic obstructive pulmonary disease with (acute) exacerbation: Secondary | ICD-10-CM | POA: Insufficient documentation

## 2018-06-03 NOTE — ED Triage Notes (Addendum)
Pt to triage via w/c with no distress noted; pt reports prod cough clear sputum & sinus drainage tonight; st hx COPD, asthma and bronchitis; also c/o SHOB and chest tightness

## 2018-06-04 ENCOUNTER — Emergency Department
Admission: EM | Admit: 2018-06-04 | Discharge: 2018-06-04 | Disposition: A | Discharge status: Home / Self Care | Payer: No Typology Code available for payment source | Attending: Emergency Medicine | Admitting: Emergency Medicine

## 2018-06-04 ENCOUNTER — Emergency Department: Payer: No Typology Code available for payment source

## 2018-06-04 DIAGNOSIS — J441 Chronic obstructive pulmonary disease with (acute) exacerbation: Secondary | ICD-10-CM

## 2018-06-04 LAB — CBC WITH DIFFERENTIAL/PLATELET
Basophils Absolute: 0.1 10*3/uL (ref 0–0.1)
Basophils Relative: 1 %
EOS ABS: 0.1 10*3/uL (ref 0–0.7)
EOS PCT: 2 %
HCT: 37.3 % (ref 35.0–47.0)
Hemoglobin: 12.9 g/dL (ref 12.0–16.0)
LYMPHS ABS: 2.1 10*3/uL (ref 1.0–3.6)
LYMPHS PCT: 29 %
MCH: 27.6 pg (ref 26.0–34.0)
MCHC: 34.5 g/dL (ref 32.0–36.0)
MCV: 80 fL (ref 80.0–100.0)
MONO ABS: 0.4 10*3/uL (ref 0.2–0.9)
MONOS PCT: 5 %
Neutro Abs: 4.6 10*3/uL (ref 1.4–6.5)
Neutrophils Relative %: 63 %
PLATELETS: 360 10*3/uL (ref 150–440)
RBC: 4.66 MIL/uL (ref 3.80–5.20)
RDW: 15.8 % — ABNORMAL HIGH (ref 11.5–14.5)
WBC: 7.3 10*3/uL (ref 3.6–11.0)

## 2018-06-04 LAB — COMPREHENSIVE METABOLIC PANEL
ALT: 20 U/L (ref 0–44)
ANION GAP: 6 (ref 5–15)
AST: 27 U/L (ref 15–41)
Albumin: 3.9 g/dL (ref 3.5–5.0)
Alkaline Phosphatase: 80 U/L (ref 38–126)
BUN: 7 mg/dL (ref 6–20)
CALCIUM: 8.9 mg/dL (ref 8.9–10.3)
CHLORIDE: 109 mmol/L (ref 98–111)
CO2: 25 mmol/L (ref 22–32)
CREATININE: 0.91 mg/dL (ref 0.44–1.00)
Glucose, Bld: 132 mg/dL — ABNORMAL HIGH (ref 70–99)
Potassium: 3.2 mmol/L — ABNORMAL LOW (ref 3.5–5.1)
Sodium: 140 mmol/L (ref 135–145)
Total Bilirubin: 0.2 mg/dL — ABNORMAL LOW (ref 0.3–1.2)
Total Protein: 7.8 g/dL (ref 6.5–8.1)

## 2018-06-04 LAB — TROPONIN I

## 2018-06-04 IMAGING — CR DG CHEST 2V
2 series · 2 of 2 positions shown · non-contrast
Comparison: Chest radiograph dated [DATE]

CLINICAL DATA: 57-year-old female with shortness of breath.

EXAM:
CHEST - 2 VIEW

[chest pa]
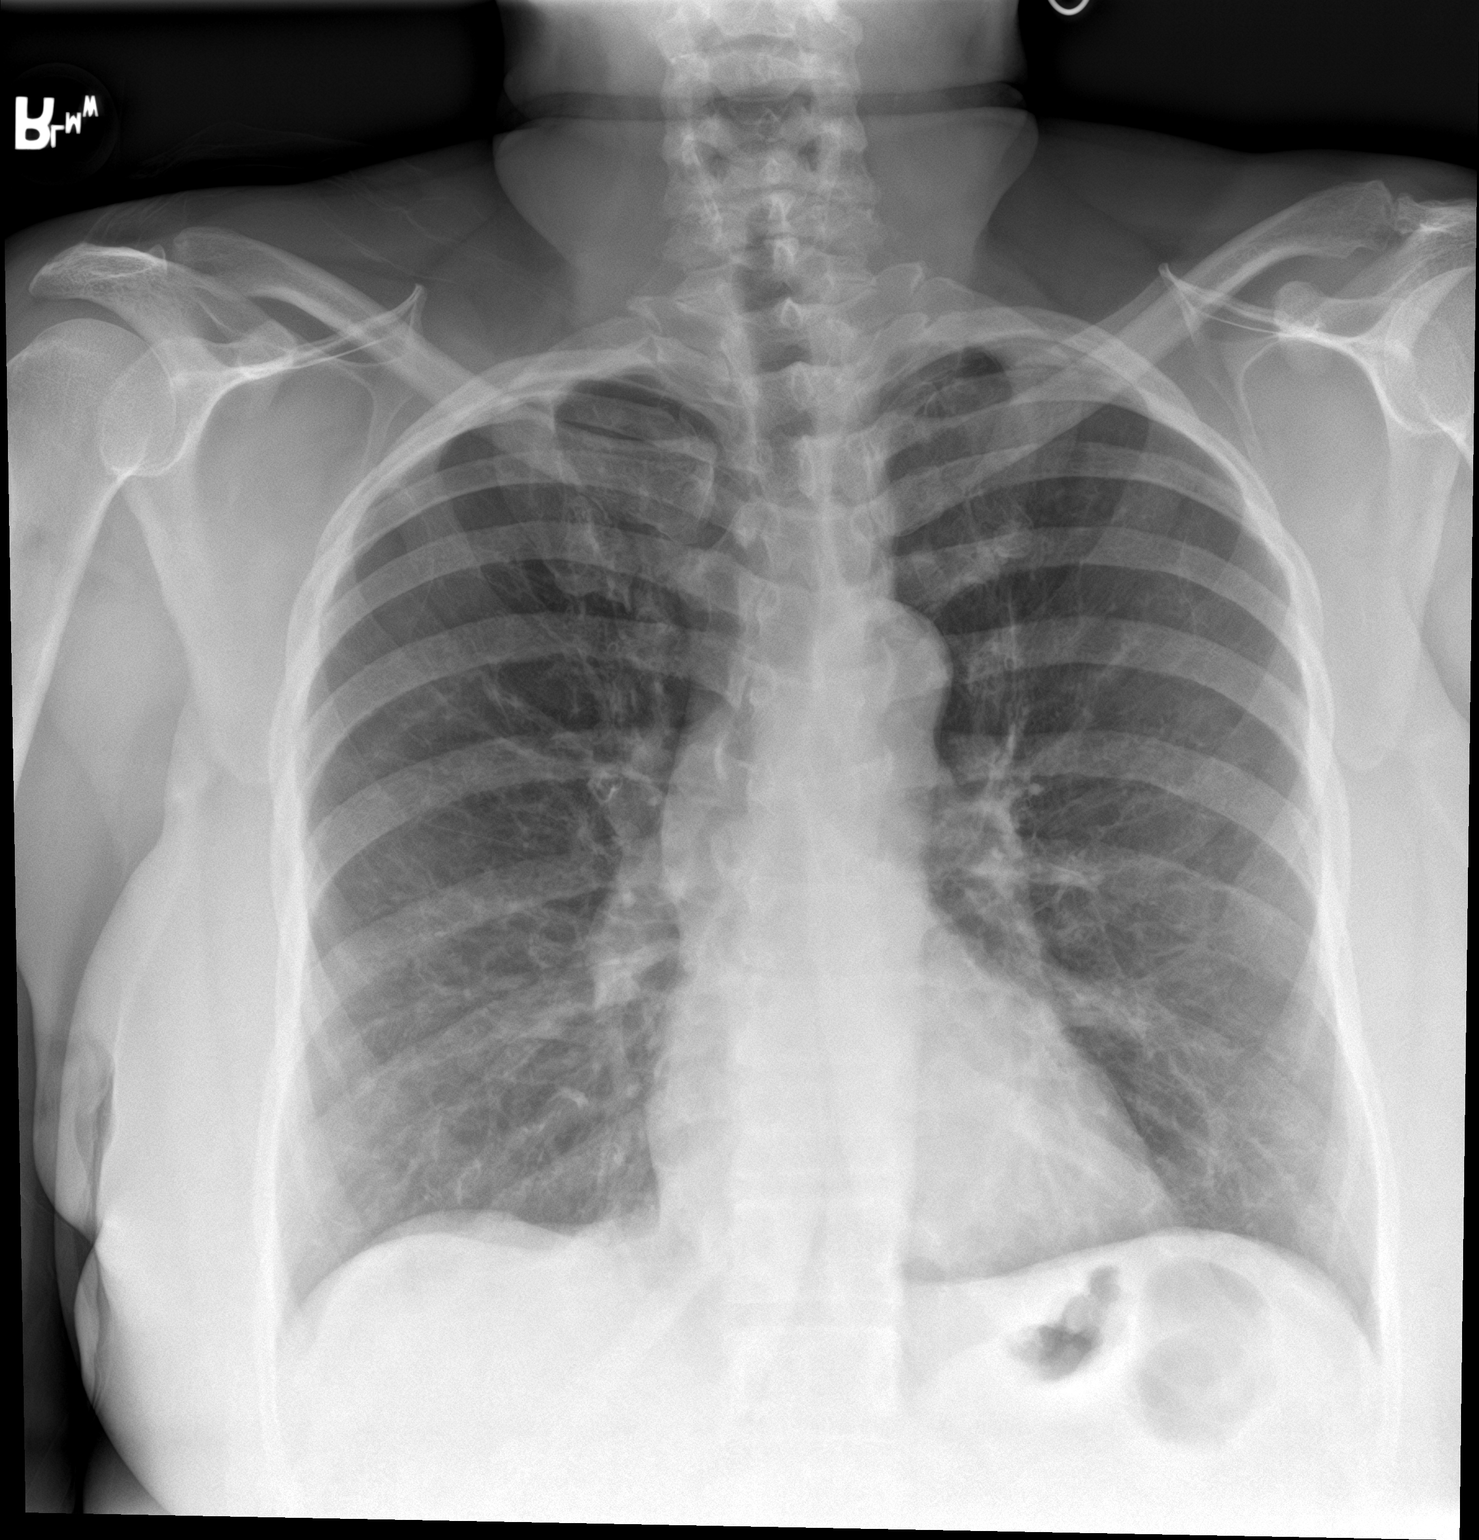

[chest lat]
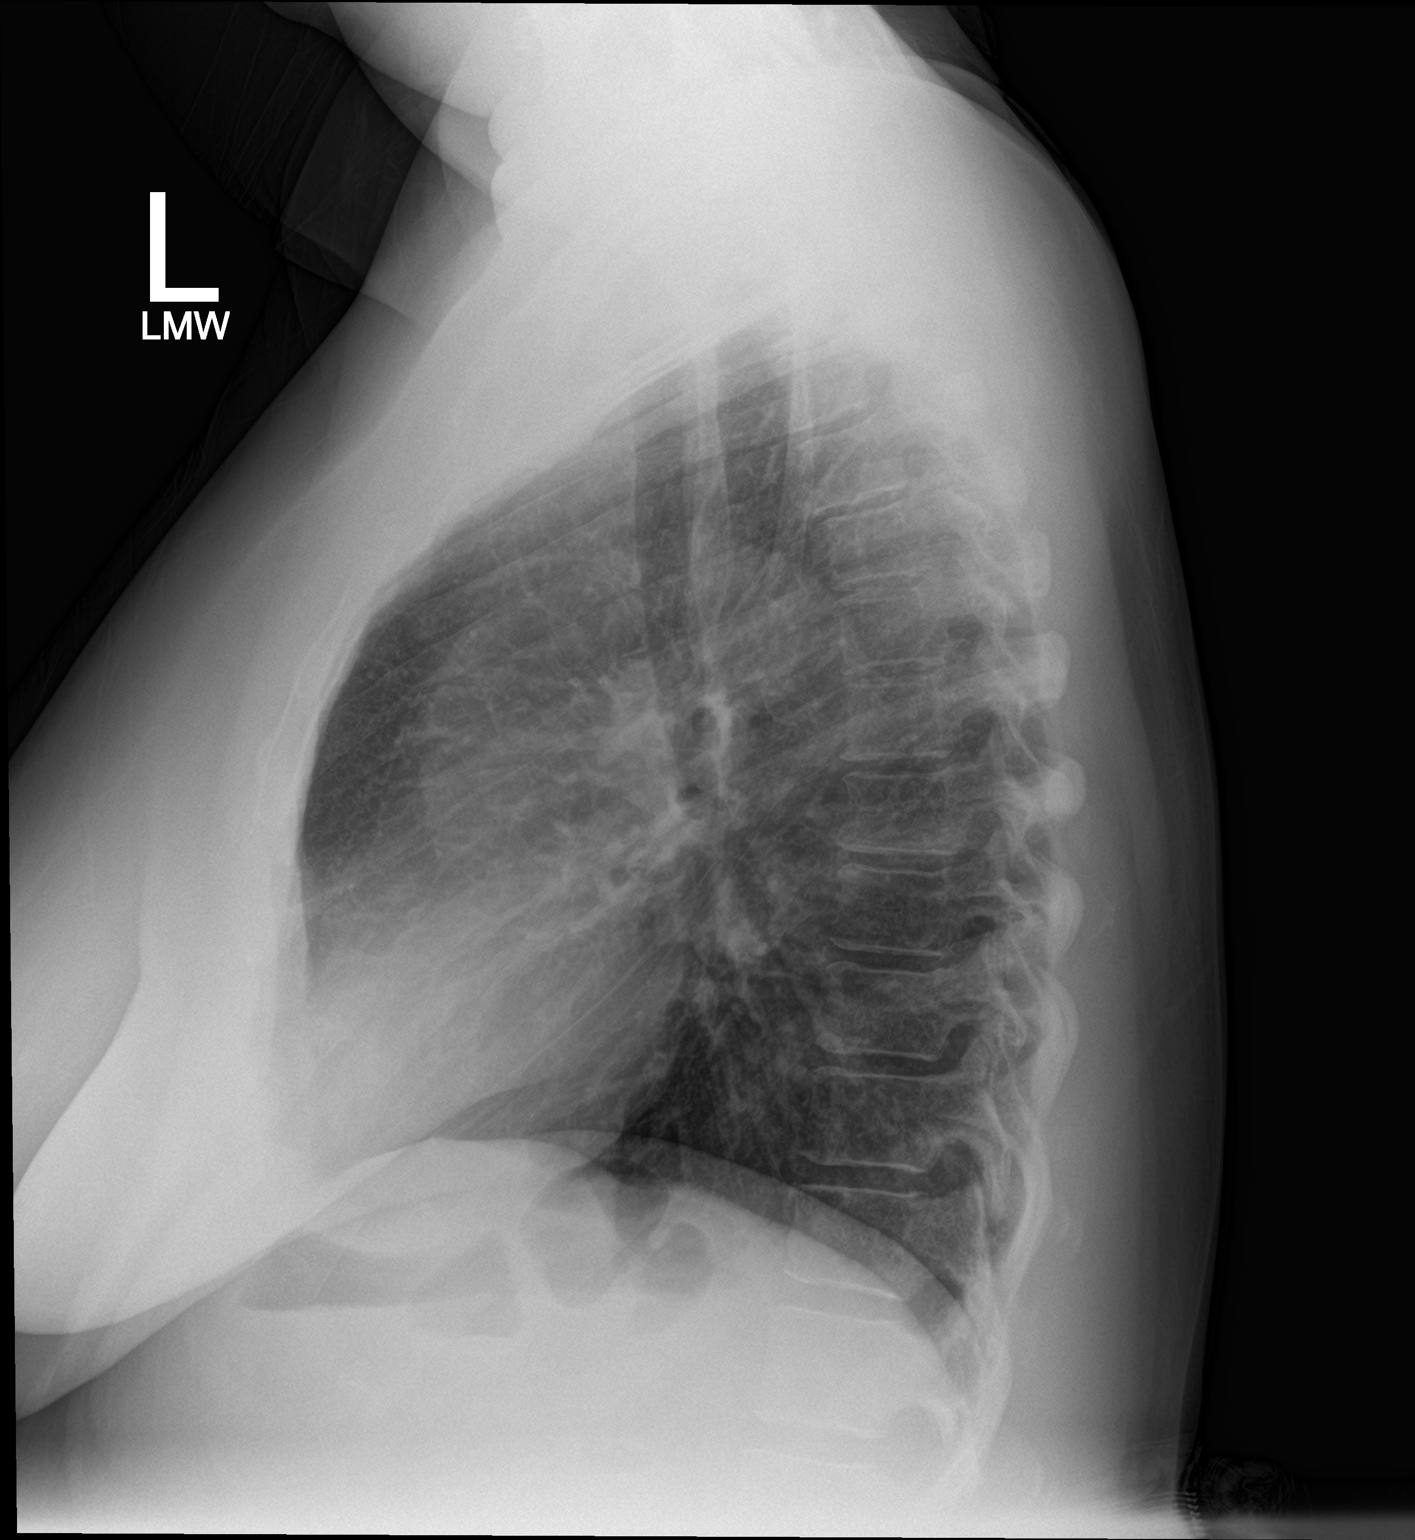

[2 of 2 positions shown; findings below may reference images not displayed]

FINDINGS: There is no focal consolidation, pleural effusion, or pneumothorax.
Probable mild bronchiectatic changes and a 4.5 cm right upper lobe
subpleural bleb. The cardiac silhouette is within normal limits. No
acute osseous pathology.
IMPRESSION: No active cardiopulmonary disease.

## 2018-06-04 MED ORDER — IPRATROPIUM-ALBUTEROL 0.5-2.5 (3) MG/3ML IN SOLN
RESPIRATORY_TRACT | Status: AC
  Filled 2018-06-04: qty 3

## 2018-06-04 MED ORDER — ALBUTEROL SULFATE (2.5 MG/3ML) 0.083% IN NEBU: 2.5000 mg | INHALATION_SOLUTION | Freq: Four times a day (QID) | RESPIRATORY_TRACT | 12 refills | Status: DC | PRN

## 2018-06-04 MED ORDER — PREDNISONE 20 MG PO TABS: 60.0000 mg | ORAL_TABLET | Freq: Every day | ORAL | 0 refills | Status: DC

## 2018-06-04 MED ORDER — ALBUTEROL SULFATE HFA 108 (90 BASE) MCG/ACT IN AERS: INHALATION_SPRAY | RESPIRATORY_TRACT | 1 refills | Status: DC

## 2018-06-04 MED ORDER — HYDROCODONE-HOMATROPINE 5-1.5 MG/5ML PO SYRP: 5.0000 mL | ORAL_SOLUTION | Freq: Four times a day (QID) | ORAL | 0 refills | Status: DC | PRN

## 2018-06-04 MED ORDER — IPRATROPIUM-ALBUTEROL 0.5-2.5 (3) MG/3ML IN SOLN
3.0000 mL | Freq: Once | RESPIRATORY_TRACT | Status: AC
  Administered 2018-06-04: 3 mL via RESPIRATORY_TRACT
  Filled 2018-06-04: qty 3

## 2018-06-04 MED ORDER — PREDNISONE 20 MG PO TABS
60.0000 mg | ORAL_TABLET | ORAL | Status: AC
  Administered 2018-06-04: 60 mg via ORAL
  Filled 2018-06-04: qty 3

## 2018-06-04 MED ORDER — IPRATROPIUM-ALBUTEROL 0.5-2.5 (3) MG/3ML IN SOLN
3.0000 mL | Freq: Once | RESPIRATORY_TRACT | Status: AC
  Administered 2018-06-04: 3 mL via RESPIRATORY_TRACT

## 2018-06-04 MED ORDER — GUAIFENESIN 100 MG/5ML PO SOLN
5.0000 mL | Freq: Once | ORAL | Status: AC
  Administered 2018-06-04: 100 mg via ORAL
  Filled 2018-06-04: qty 5

## 2018-06-04 NOTE — Discharge Instructions (Signed)
We believe that your symptoms are caused today by an exacerbation of your COPD.  Please take the prescribed medications and any medications that you have at home for your COPD.  Follow up with your doctor as recommended.  If you develop any new or worsening symptoms, including but not limited to fever, persistent vomiting, worsening shortness of breath, or other symptoms that concern you, please return to the Emergency Department immediately.  

## 2018-06-04 NOTE — ED Notes (Signed)
Pt states feeling better at this time. Talking on the phone with family. In NAD

## 2018-06-04 NOTE — ED Provider Notes (Signed)
[facility] Emergency Department Provider Note  ____________________________________________   First MD Initiated Contact with Patient 06/04/18 [phone]     (approximate)  I have reviewed the triage vital signs and the nursing notes.   HISTORY  Chief Complaint Cough and Shortness of Breath    HPI Cathy Hughes is a 57 y.o. female whose medical history includes both COPD and asthma and who presents for evaluation of worsening shortness of breath, wheezing, and cough today.  She states that she has COPD but has been out of her medications including both nebulizer solution and inhalers for an extended period of time and she does not have an appoint with her PCP for another 2 weeks.  She says that her symptoms get worse with exertion and when she has been cleaning the house, both of which she did today.  She describes her shortness of breath is severe and nothing in particular makes it better.  She denies fever/chills, chest pain, nausea, vomiting, abdominal pain, and dysuria.  She has not had any swelling in her legs.  She has not been on antibiotics recently.  Past Medical History:  Diagnosis Date  . Asthma   . COPD (chronic obstructive pulmonary disease) ([place])   . Hypertension     There are no active problems to display for this patient.   Past Surgical History:  Procedure Laterality Date  . CHOLECYSTECTOMY    . COLON SURGERY    . thumb surgery      Prior to Admission medications   Medication Sig Start Date End Date Taking? Authorizing Provider  albuterol (PROVENTIL HFA;VENTOLIN HFA) 108 (90 Base) MCG/ACT inhaler Inhale 2-4 puffs by mouth every 4 hours as needed for wheezing, cough, and/or shortness of breath 06/04/18   Hinda Kehr, MD  albuterol (PROVENTIL) (2.5 MG/3ML) 0.083% nebulizer solution Take 3 mLs (2.5 mg total) by nebulization every 6 (six) hours as needed for wheezing or shortness of breath. 06/04/18   Hinda Kehr, MD  amLODipine  (NORVASC) 5 MG tablet Take 1 tablet (5 mg total) by mouth daily. 06/27/17 06/27/18  Gregor Hams, MD  HYDROcodone-homatropine [facility]) 5-1.5 MG/5ML syrup Take 5 mLs by mouth every 6 (six) hours as needed for cough. 06/04/18   Hinda Kehr, MD  lisinopril-hydrochlorothiazide (PRINZIDE,ZESTORETIC) 10-12.5 MG per tablet Take 1 tablet by mouth daily.  11/25/13   [provider]  predniSONE (DELTASONE) 20 MG tablet Take 3 tablets (60 mg total) by mouth daily. 06/04/18   Hinda Kehr, MD    Allergies Aspirin  Family History  Problem Relation Age of Onset  . Diabetes Mother   . Hypertension Mother   . Diabetes Father   . Hypertension Father     Social History Social History   Tobacco Use  . Smoking status: Current Every Day Smoker    Packs/day: 0.50    Types: Cigarettes  . Smokeless tobacco: Never Used  Substance Use Topics  . Alcohol use: Yes    Comment: occ  . Drug use: No    Review of Systems Constitutional: No fever/chills Eyes: No visual changes. ENT: No sore throat. Cardiovascular: Denies chest pain. Respiratory: shortness of breath as described above Gastrointestinal: No abdominal pain.  No nausea, no vomiting.  No diarrhea.  No constipation. Genitourinary: Negative for dysuria. Musculoskeletal: Negative for neck pain.  Negative for back pain. Integumentary: Negative for rash. Neurological: Negative for headaches, focal weakness or numbness.   ____________________________________________   PHYSICAL EXAM:  VITAL SIGNS: ED Triage Vitals  Enc Vitals Group     BP 06/03/18 2356 (!) 140/97     Pulse Rate 06/03/18 2356 96     Resp 06/03/18 2356 18     Temp 06/03/18 2356 98.7 F (37.1 C)     Temp Source 06/03/18 2356 Oral     SpO2 06/03/18 2356 97 %     Weight 06/03/18 2357 80.7 kg (178 lb)     Height 06/03/18 2357 1.651 m (5\' 5" )     Head Circumference --      Peak Flow --      Pain Score 06/03/18 2355 10     Pain Loc --      Pain Edu? --       Excl. in [place]? --     Constitutional: Alert and oriented. Well appearing and in no acute distress. Eyes: Conjunctivae are normal.  Head: Atraumatic. Nose: No congestion/rhinnorhea. Mouth/Throat: Mucous membranes are moist. Neck: No stridor.  No meningeal signs.   Cardiovascular: Normal rate, regular rhythm. Good peripheral circulation. Grossly normal heart sounds. Respiratory: Normal respiratory effort.  No retractions but heavy expiratory wheezing throughout all lung fields Gastrointestinal: Soft and nontender. No distention.  Musculoskeletal: No lower extremity tenderness nor edema. No gross deformities of extremities. Neurologic:  Normal speech and language. No gross focal neurologic deficits are appreciated.  Skin:  Skin is warm, dry and intact. No rash noted. Psychiatric: Mood and affect are normal. Speech and behavior are normal.  ____________________________________________   LABS (all labs ordered are listed, but only abnormal results are displayed)  Labs Reviewed  CBC WITH DIFFERENTIAL/PLATELET - Abnormal; Notable for the following components:      Result Value   RDW 15.8 (*)    All other components within normal limits  COMPREHENSIVE METABOLIC PANEL - Abnormal; Notable for the following components:   Potassium 3.2 (*)    Glucose, Bld 132 (*)    Total Bilirubin 0.2 (*)    All other components within normal limits  TROPONIN I   ____________________________________________  EKG  ED ECG REPORT I, Hinda Kehr, the attending physician, personally viewed and interpreted this ECG.  Date: 06/04/2018 EKG Time: 00: 02 Rate: 93 Rhythm: normal sinus rhythm QRS Axis: normal Intervals: normal ST/T Wave abnormalities: normal Narrative Interpretation: no evidence of acute ischemia  ____________________________________________  RADIOLOGY I, Hinda Kehr, personally viewed and evaluated these images (plain radiographs) as part of my medical decision making, as well as  reviewing the written report by the radiologist.  ED MD interpretation: No indication of pneumonia  Official radiology report(s): Dg Chest 2 View  Result Date: 06/04/2018 CLINICAL DATA:  57 year old female with shortness of breath. EXAM: CHEST - 2 VIEW COMPARISON:  Chest radiograph dated 06/26/2017 FINDINGS: There is no focal consolidation, pleural effusion, or pneumothorax. Probable mild bronchiectatic changes and a 4.5 cm right upper lobe subpleural bleb. The cardiac silhouette is within normal limits. No acute osseous pathology. IMPRESSION: No active cardiopulmonary disease. Electronically Signed   By: Anner Crete M.D.   On: 06/04/2018 00:44    ____________________________________________   PROCEDURES  Critical Care performed: No   Procedure(s) performed:   Procedures   ____________________________________________   INITIAL IMPRESSION / ASSESSMENT AND PLAN / ED COURSE  As part of my medical decision making, I reviewed the following data within the [place] notes reviewed and incorporated, Labs reviewed , EKG interpreted , Radiograph reviewed  and Notes from prior ED visits    Differential diagnosis includes, but is  not limited to, COPD exacerbation, viral illness, pneumonia, CHF exacerbation, ACS.  Patient actually looks fairly well, and she is not having any retractions, but she does have very extensive expiratory wheezing throughout even after a breathing treatment.  However she says that she feels much better than before.  She thinks that if she has some more breathing treatments she will feel well enough to go home.  I am giving her prednisone 60 mg by mouth and 2 additional DuoNeb's and discharging her with prescriptions for an inhaler, albuterol nebulizer solution, and a burst course of prednisone.  She agrees with the current plan.  I will reassess after her treatments.  Clinical Course as of Jun 04 550  Tue Jun 04, 2018  0550 The patient  sounds much better after breathing treatments and prednisone.  She is comfortable going home.  I am also prescribing some cough syrup for her.  I gave my usual and customary return precautions.   [CF]    Clinical Course User Index [CF] Hinda Kehr, MD    ____________________________________________  FINAL CLINICAL IMPRESSION(S) / ED DIAGNOSES  Final diagnoses:  COPD exacerbation ([place])     MEDICATIONS GIVEN DURING THIS VISIT:  Medications  ipratropium-albuterol (DUONEB) 0.5-2.5 (3) MG/3ML nebulizer solution 3 mL (3 mLs Nebulization Given 06/04/18 0342)  predniSONE (DELTASONE) tablet 60 mg (60 mg Oral Given 06/04/18 0434)  ipratropium-albuterol (DUONEB) 0.5-2.5 (3) MG/3ML nebulizer solution 3 mL (3 mLs Nebulization Given 06/04/18 0438)  ipratropium-albuterol (DUONEB) 0.5-2.5 (3) MG/3ML nebulizer solution 3 mL (3 mLs Nebulization Given 06/04/18 0438)     ED Discharge Orders         Ordered    albuterol (PROVENTIL HFA;VENTOLIN HFA) 108 (90 Base) MCG/ACT inhaler  Status:  Discontinued     06/04/18 0523    albuterol (PROVENTIL) (2.5 MG/3ML) 0.083% nebulizer solution  Every 6 hours PRN     06/04/18 0523    predniSONE (DELTASONE) 20 MG tablet  Daily,   Status:  Discontinued     06/04/18 0523    albuterol (PROVENTIL HFA;VENTOLIN HFA) 108 (90 Base) MCG/ACT inhaler     06/04/18 0524    predniSONE (DELTASONE) 20 MG tablet  Daily     06/04/18 0524    HYDROcodone-homatropine (HYCODAN) 5-1.5 MG/5ML syrup  Every 6 hours PRN     06/04/18 0550           Note:  This document was prepared using Dragon voice recognition software and may include unintentional dictation errors.    Hinda Kehr, MD 06/04/18 [id number]

## 2018-07-31 ENCOUNTER — Encounter: Payer: Self-pay | Admitting: Family Medicine

## 2018-07-31 ENCOUNTER — Ambulatory Visit: Payer: Self-pay | Admitting: Family Medicine

## 2018-07-31 ENCOUNTER — Other Ambulatory Visit: Payer: Self-pay

## 2018-07-31 VITALS — BP 130/84 | HR 98 | Temp 98.2°F | Resp 16 | Ht 65.0 in | Wt 210.2 lb

## 2018-07-31 DIAGNOSIS — J449 Chronic obstructive pulmonary disease, unspecified: Secondary | ICD-10-CM | POA: Diagnosis not present

## 2018-07-31 DIAGNOSIS — Z23 Encounter for immunization: Secondary | ICD-10-CM | POA: Diagnosis not present

## 2018-07-31 DIAGNOSIS — E6609 Other obesity due to excess calories: Secondary | ICD-10-CM | POA: Diagnosis not present

## 2018-07-31 DIAGNOSIS — Z1211 Encounter for screening for malignant neoplasm of colon: Secondary | ICD-10-CM

## 2018-07-31 DIAGNOSIS — Z1212 Encounter for screening for malignant neoplasm of rectum: Secondary | ICD-10-CM

## 2018-07-31 DIAGNOSIS — Z1231 Encounter for screening mammogram for malignant neoplasm of breast: Secondary | ICD-10-CM

## 2018-07-31 DIAGNOSIS — Z6834 Body mass index (BMI) 34.0-34.9, adult: Secondary | ICD-10-CM | POA: Insufficient documentation

## 2018-07-31 DIAGNOSIS — Z13220 Encounter for screening for lipoid disorders: Secondary | ICD-10-CM

## 2018-07-31 DIAGNOSIS — Z1159 Encounter for screening for other viral diseases: Secondary | ICD-10-CM

## 2018-07-31 DIAGNOSIS — I10 Essential (primary) hypertension: Secondary | ICD-10-CM

## 2018-07-31 DIAGNOSIS — Z114 Encounter for screening for human immunodeficiency virus [HIV]: Secondary | ICD-10-CM

## 2018-07-31 MED ORDER — ALBUTEROL SULFATE HFA 108 (90 BASE) MCG/ACT IN AERS: INHALATION_SPRAY | RESPIRATORY_TRACT | 1 refills | Status: DC

## 2018-07-31 MED ORDER — FLUTICASONE-UMECLIDIN-VILANT 100-62.5-25 MCG/INH IN AEPB: 1.0000 | INHALATION_SPRAY | Freq: Every day | RESPIRATORY_TRACT | 0 refills | Status: DC

## 2018-07-31 MED ORDER — AMLODIPINE BESYLATE 5 MG PO TABS: 5.0000 mg | ORAL_TABLET | Freq: Every day | ORAL | 0 refills | Status: DC

## 2018-07-31 MED ORDER — IPRATROPIUM-ALBUTEROL 0.5-2.5 (3) MG/3ML IN SOLN: 3.0000 mL | Freq: Four times a day (QID) | RESPIRATORY_TRACT | 0 refills | Status: DC | PRN

## 2018-07-31 NOTE — Assessment & Plan Note (Signed)
Stopped lisinopril due to cough; taking 5mg  Amlodipine.

## 2018-07-31 NOTE — Patient Instructions (Signed)
Calorie Counting for Weight Loss Calories are units of energy. Your body needs a certain amount of calories from food to keep you going throughout the day. When you eat more calories than your body needs, your body stores the extra calories as fat. When you eat fewer calories than your body needs, your body burns fat to get the energy it needs. Calorie counting means keeping track of how many calories you eat and drink each day. Calorie counting can be helpful if you need to lose weight. If you make sure to eat fewer calories than your body needs, you should lose weight. Ask your health care provider what a healthy weight is for you. For calorie counting to work, you will need to eat the right number of calories in a day in order to lose a healthy amount of weight per week. A dietitian can help you determine how many calories you need in a day and will give you suggestions on how to reach your calorie goal.  A healthy amount of weight to lose per week is usually 1-2 lb (0.5-0.9 kg). This usually means that your daily calorie intake should be reduced by 500-750 calories.  Eating 1,200 - 1,500 calories per day can help most women lose weight.  Eating 1,500 - 1,800 calories per day can help most men lose weight.  What is my plan? My goal is to have __________ calories per day. If I have this many calories per day, I should lose around __________ pounds per week. What do I need to know about calorie counting? In order to meet your daily calorie goal, you will need to:  Find out how many calories are in each food you would like to eat. Try to do this before you eat.  Decide how much of the food you plan to eat.  Write down what you ate and how many calories it had. Doing this is called keeping a food log.  To successfully lose weight, it is important to balance calorie counting with a healthy lifestyle that includes regular activity. Aim for 150 minutes of moderate exercise (such as walking) or 75  minutes of vigorous exercise (such as running) each week. Where do I find calorie information?  The number of calories in a food can be found on a Nutrition Facts label. If a food does not have a Nutrition Facts label, try to look up the calories online or ask your dietitian for help. Remember that calories are listed per serving. If you choose to have more than one serving of a food, you will have to multiply the calories per serving by the amount of servings you plan to eat. For example, the label on a package of bread might say that a serving size is 1 slice and that there are 90 calories in a serving. If you eat 1 slice, you will have eaten 90 calories. If you eat 2 slices, you will have eaten 180 calories. How do I keep a food log? Immediately after each meal, record the following information in your food log:  What you ate. Don't forget to include toppings, sauces, and other extras on the food.  How much you ate. This can be measured in cups, ounces, or number of items.  How many calories each food and drink had.  The total number of calories in the meal.  Keep your food log near you, such as in a small notebook in your pocket, or use a mobile app or website. Some   programs will calculate calories for you and show you how many calories you have left for the day to meet your goal. What are some calorie counting tips?  Use your calories on foods and drinks that will fill you up and not leave you hungry: ? Some examples of foods that fill you up are nuts and nut butters, vegetables, lean proteins, and high-fiber foods like whole grains. High-fiber foods are foods with more than 5 g fiber per serving. ? Drinks such as sodas, specialty coffee drinks, alcohol, and juices have a lot of calories, yet do not fill you up.  Eat nutritious foods and avoid empty calories. Empty calories are calories you get from foods or beverages that do not have many vitamins or protein, such as candy, sweets, and  soda. It is better to have a nutritious high-calorie food (such as an avocado) than a food with few nutrients (such as a bag of chips).  Know how many calories are in the foods you eat most often. This will help you calculate calorie counts faster.  Pay attention to calories in drinks. Low-calorie drinks include water and unsweetened drinks.  Pay attention to nutrition labels for "low fat" or "fat free" foods. These foods sometimes have the same amount of calories or more calories than the full fat versions. They also often have added sugar, starch, or salt, to make up for flavor that was removed with the fat.  Find a way of tracking calories that works for you. Get creative. Try different apps or programs if writing down calories does not work for you. What are some portion control tips?  Know how many calories are in a serving. This will help you know how many servings of a certain food you can have.  Use a measuring cup to measure serving sizes. You could also try weighing out portions on a kitchen scale. With time, you will be able to estimate serving sizes for some foods.  Take some time to put servings of different foods on your favorite plates, bowls, and cups so you know what a serving looks like.  Try not to eat straight from a bag or box. Doing this can lead to overeating. Put the amount you would like to eat in a cup or on a plate to make sure you are eating the right portion.  Use smaller plates, glasses, and bowls to prevent overeating.  Try not to multitask (for example, watch TV or use your computer) while eating. If it is time to eat, sit down at a table and enjoy your food. This will help you to know when you are full. It will also help you to be aware of what you are eating and how much you are eating. What are tips for following this plan? Reading food labels  Check the calorie count compared to the serving size. The serving size may be smaller than what you are used to  eating.  Check the source of the calories. Make sure the food you are eating is high in vitamins and protein and low in saturated and trans fats. Shopping  Read nutrition labels while you shop. This will help you make healthy decisions before you decide to purchase your food.  Make a grocery list and stick to it. Cooking  Try to cook your favorite foods in a healthier way. For example, try baking instead of frying.  Use low-fat dairy products. Meal planning  Use more fruits and vegetables. Half of your plate should   be fruits and vegetables.  Include lean proteins like poultry and fish. How do I count calories when eating out?  Ask for smaller portion sizes.  Consider sharing an entree and sides instead of getting your own entree.  If you get your own entree, eat only half. Ask for a box at the beginning of your meal and put the rest of your entree in it so you are not tempted to eat it.  If calories are listed on the menu, choose the lower calorie options.  Choose dishes that include vegetables, fruits, whole grains, low-fat dairy products, and lean protein.  Choose items that are boiled, broiled, grilled, or steamed. Stay away from items that are buttered, battered, fried, or served with cream sauce. Items labeled "crispy" are usually fried, unless stated otherwise.  Choose water, low-fat milk, unsweetened iced tea, or other drinks without added sugar. If you want an alcoholic beverage, choose a lower calorie option such as a glass of wine or light beer.  Ask for dressings, sauces, and syrups on the side. These are usually high in calories, so you should limit the amount you eat.  If you want a salad, choose a garden salad and ask for grilled meats. Avoid extra toppings like bacon, cheese, or fried items. Ask for the dressing on the side, or ask for olive oil and vinegar or lemon to use as dressing.  Estimate how many servings of a food you are given. For example, a serving of  cooked rice is  cup or about the size of half a baseball. Knowing serving sizes will help you be aware of how much food you are eating at restaurants. The list below tells you how big or small some common portion sizes are based on everyday objects: ? 1 oz-4 stacked dice. ? 3 oz-1 deck of cards. ? 1 tsp-1 die. ? 1 Tbsp- a ping-pong ball. ? 2 Tbsp-1 ping-pong ball. ?  cup- baseball. ? 1 cup-1 baseball. Summary  Calorie counting means keeping track of how many calories you eat and drink each day. If you eat fewer calories than your body needs, you should lose weight.  A healthy amount of weight to lose per week is usually 1-2 lb (0.5-0.9 kg). This usually means reducing your daily calorie intake by 500-750 calories.  The number of calories in a food can be found on a Nutrition Facts label. If a food does not have a Nutrition Facts label, try to look up the calories online or ask your dietitian for help.  Use your calories on foods and drinks that will fill you up, and not on foods and drinks that will leave you hungry.  Use smaller plates, glasses, and bowls to prevent overeating. This information is not intended to replace advice given to you by your health care provider. Make sure you discuss any questions you have with your health care provider. Document Released: 08/28/2005 Document Revised: 07/28/2016 Document Reviewed: 07/28/2016 Elsevier Interactive Patient Education  2018 [place] refers to food and lifestyle choices that are based on the traditions of countries located on the [employer]. This way of eating has been shown to help prevent certain conditions and improve outcomes for people who have chronic diseases, like kidney disease and heart disease. What are tips for following this plan? Lifestyle  Cook and eat meals together with your family, when possible.  Drink enough fluid to keep your urine clear or pale  yellow.  Be  physically active every day. This includes: ? Aerobic exercise like running or swimming. ? Leisure activities like gardening, walking, or housework.  Get 7-8 hours of sleep each night.  If recommended by your health care provider, drink red wine in moderation. This means 1 glass a day for nonpregnant women and 2 glasses a day for men. A glass of wine equals 5 oz (150 mL). Reading food labels  Check the serving size of packaged foods. For foods such as rice and pasta, the serving size refers to the amount of cooked product, not dry.  Check the total fat in packaged foods. Avoid foods that have saturated fat or trans fats.  Check the ingredients list for added sugars, such as corn syrup. Shopping  At the grocery store, buy most of your food from the areas near the walls of the store. This includes: ? Fresh fruits and vegetables (produce). ? Grains, beans, nuts, and seeds. Some of these may be available in unpackaged forms or large amounts (in bulk). ? Fresh seafood. ? Poultry and eggs. ? Low-fat dairy products.  Buy whole ingredients instead of prepackaged foods.  Buy fresh fruits and vegetables in-season from local farmers markets.  Buy frozen fruits and vegetables in resealable bags.  If you do not have access to quality fresh seafood, buy precooked frozen shrimp or canned fish, such as tuna, salmon, or sardines.  Buy small amounts of raw or cooked vegetables, salads, or olives from the deli or salad bar at your store.  Stock your pantry so you always have certain foods on hand, such as olive oil, canned tuna, canned tomatoes, rice, pasta, and beans. Cooking  Cook foods with extra-virgin olive oil instead of using butter or other vegetable oils.  Have meat as a side dish, and have vegetables or grains as your main dish. This means having meat in small portions or adding small amounts of meat to foods like pasta or stew.  Use beans or vegetables instead of meat in  common dishes like chili or lasagna.  Experiment with different cooking methods. Try roasting or broiling vegetables instead of steaming or sauteing them.  Add frozen vegetables to soups, stews, pasta, or rice.  Add nuts or seeds for added healthy fat at each meal. You can add these to yogurt, salads, or vegetable dishes.  Marinate fish or vegetables using olive oil, lemon juice, garlic, and fresh herbs. Meal planning  Plan to eat 1 vegetarian meal one day each week. Try to work up to 2 vegetarian meals, if possible.  Eat seafood 2 or more times a week.  Have healthy snacks readily available, such as: ? Vegetable sticks with hummus. ? [place] yogurt. ? Fruit and nut trail mix.  Eat balanced meals throughout the week. This includes: ? Fruit: 2-3 servings a day ? Vegetables: 4-5 servings a day ? Low-fat dairy: 2 servings a day ? Fish, poultry, or lean meat: 1 serving a day ? Beans and legumes: 2 or more servings a week ? Nuts and seeds: 1-2 servings a day ? Whole grains: 6-8 servings a day ? Extra-virgin olive oil: 3-4 servings a day  Limit red meat and sweets to only a few servings a month What are my food choices?  Mediterranean diet ? Recommended ? Grains: Whole-grain pasta. [place] rice. Bulgar wheat. Polenta. Couscous. Whole-wheat bread. [place]. ? Vegetables: Artichokes. Beets. Broccoli. Cabbage. Carrots. Eggplant. Green beans. Chard. Kale. Spinach. Onions. Leeks. Peas. Squash. Tomatoes. Peppers. Radishes. ? Fruits: Apples. Apricots. Avocado. Berries.  Bananas. Cherries. Dates. Figs. Grapes. Lemons. Melon. Oranges. Peaches. Plums. Pomegranate. ? Meats and other protein foods: Beans. Almonds. Sunflower seeds. Pine nuts. Peanuts. [place]. Salmon. Scallops. Shrimp. [place]. Tilapia. Clams. Oysters. Eggs. ? Dairy: Low-fat milk. Cheese. Greek yogurt. ? Beverages: Water. Red wine. Herbal tea. ? Fats and oils: Extra virgin olive oil. Avocado oil. Grape seed oil. ? Sweets and  desserts: [place] yogurt with honey. Baked apples. Poached pears. Trail mix. ? Seasoning and other foods: Basil. Cilantro. Coriander. Cumin. Mint. Parsley. Sage. Rosemary. Tarragon. Garlic. Oregano. Thyme. Pepper. Balsalmic vinegar. Tahini. Hummus. Tomato sauce. Olives. Mushrooms. ? Limit these ? Grains: Prepackaged pasta or rice dishes. Prepackaged cereal with added sugar. ? Vegetables: Deep fried potatoes (french fries). ? Fruits: Fruit canned in syrup. ? Meats and other protein foods: Beef. Pork. Lamb. Poultry with skin. Hot dogs. [place] Salines. ? Dairy: Ice cream. Sour cream. Whole milk. ? Beverages: Juice. Sugar-sweetened soft drinks. Beer. Liquor and spirits. ? Fats and oils: Butter. Canola oil. Vegetable oil. Beef fat (tallow). Lard. ? Sweets and desserts: Cookies. Cakes. Pies. Candy. ? Seasoning and other foods: Mayonnaise. Premade sauces and marinades. ? The items listed may not be a complete list. Talk with your dietitian about what dietary choices are right for you. Summary  The Mediterranean diet includes both food and lifestyle choices.  Eat a variety of fresh fruits and vegetables, beans, nuts, seeds, and whole grains.  Limit the amount of red meat and sweets that you eat.  Talk with your health care provider about whether it is safe for you to drink red wine in moderation. This means 1 glass a day for nonpregnant women and 2 glasses a day for men. A glass of wine equals 5 oz (150 mL). This information is not intended to replace advice given to you by your health care provider. Make sure you discuss any questions you have with your health care provider. Document Released: 04/20/2016 Document Revised: 05/23/2016 Document Reviewed: 04/20/2016 Elsevier Interactive Patient Education  [employer].

## 2018-07-31 NOTE — Assessment & Plan Note (Deleted)
Stopped lisinopril due to cough; taking 5mg  Amlodipine.

## 2018-07-31 NOTE — Progress Notes (Signed)
Name: Cathy Hughes   MRN: [id number]    DOB: [birth year]   Date:08/01/2018       Progress Note  Subjective  Chief Complaint  Chief Complaint  Patient presents with  . Establish Care  . Hypertension  . COPD    HPI  Pt presents to establish care and for the following concerns:  COPD: She has been vaping to try to stop smoking cigarettes. She has been seen in the ER several times over the last 2 years for COPD exacerbation. She has been taking Dulera and flovent without relief of symptoms.  Using Albuterol Neb Q4H and it is not providing relief; she notes that when she went to the ER in September, they gave her a duoneb and this helped a lot more than plain albuterol.  Still having exertional shortness of breath and morning cough.  She also requests albuterol inhaler refill today.  She started smoking at age 50, will smoke 4-5 cigarettes a day, will increase her smoking if stressed out. We will start trelogy today - stop other inhalers except albuterol & duoneb PRN.  HTN: Was taking lisinopril - had dry cough so she stopped this medication and the cough improved - taking 5mg  Amlodipine only at this time. Denies chest pain; has shortness of breath, BLE edema, headache, vision changes. At goal today.  Obesity: She is walking daily, though distance is limited by her COPD.  Her diet is just okay - we discussed mediterranean diet, healthy fats, monitoring sweets.   Patient Active Problem List   Diagnosis Date Noted  . COPD (chronic obstructive pulmonary disease) ([place]) 07/31/2018  . Class 1 obesity due to excess calories with serious comorbidity and body mass index (BMI) of 34.0 to 34.9 in adult 07/31/2018  . HTN (hypertension) 12/09/2013   Past Surgical History:  Procedure Laterality Date  . CHOLECYSTECTOMY    . COLON SURGERY    . thumb surgery      Family History  Problem Relation Age of Onset  . Diabetes Mother   . Hypertension Mother   . Diabetes Father   . Hypertension  Father     Social History   Socioeconomic History  . Marital status: Married    Spouse name: [place]  . Number of children: 3  . Years of education: Not on file  . Highest education level: Not on file  Occupational History  . Not on file  Social Needs  . Financial resource strain: Not hard at all  . Food insecurity:    Worry: Never true    Inability: Never true  . Transportation needs:    Medical: No    Non-medical: No  Tobacco Use  . Smoking status: Current Some Day Smoker    Packs/day: 0.50    Types: Cigarettes  . Smokeless tobacco: Never Used  Substance and Sexual Activity  . Alcohol use: Yes    Comment: occ  . Drug use: No  . Sexual activity: Yes    Partners: Male  Lifestyle  . Physical activity:    Days per week: 5 days    Minutes per session: 40 min  . Stress: Only a little  Relationships  . Social connections:    Talks on phone: More than three times a week    Gets together: More than three times a week    Attends religious service: More than 4 times per year    Active member of club or organization: Yes    Attends meetings of  clubs or organizations: More than 4 times per year    Relationship status: Married  . Intimate partner violence:    Fear of current or ex partner: No    Emotionally abused: No    Physically abused: No    Forced sexual activity: No  Other Topics Concern  . Not on file  Social History Narrative  . Not on file    Current Outpatient Medications:  .  albuterol (PROVENTIL HFA;VENTOLIN HFA) 108 (90 Base) MCG/ACT inhaler, Inhale 2-4 puffs by mouth every 4 hours as needed for wheezing, cough, and/or shortness of breath, Disp: 1 Inhaler, Rfl: 1 .  amLODipine (NORVASC) 5 MG tablet, Take 1 tablet (5 mg total) by mouth daily., Disp: 90 tablet, Rfl: 0 .  Fluticasone-Umeclidin-Vilant (TRELEGY ELLIPTA) 100-62.5-25 MCG/INH AEPB, Inhale 1 puff into the lungs daily., Disp: 1 each, Rfl: 0 .  ipratropium-albuterol (DUONEB) 0.5-2.5 (3) MG/3ML SOLN,  Take 3 mLs by nebulization every 6 (six) hours as needed., Disp: 180 mL, Rfl: 0  Allergies  Allergen Reactions  . Aspirin Hives    I personally reviewed active problem list, medication list, allergies, family history, social history, health maintenance, notes from last encounter, lab results with the patient/caregiver today.  ROS  Constitutional: Negative for fever or weight change.  Respiratory: See HPI Cardiovascular: Negative for chest pain or palpitations.  Gastrointestinal: Negative for abdominal pain, no bowel changes.  Musculoskeletal: Negative for gait problem or joint swelling.  Skin: Negative for rash.  Neurological: Negative for dizziness or headache.  No other specific complaints in a complete review of systems (except as listed in HPI above).  Objective  Vitals:   07/31/18 1011  BP: 130/84  Pulse: 98  Resp: 16  Temp: 98.2 F (36.8 C)  TempSrc: Oral  SpO2: 94%  Weight: 210 lb 3.2 oz (95.3 kg)  Height: 5\' 5"  (1.651 m)   Body mass index is 34.98 kg/m.  Physical Exam Constitutional: Patient appears well-developed and well-nourished. Obese. No distress.  HENT: Head: Normocephalic and atraumatic. Ears: bilateral TMs with no erythema or effusion; Nose: Nose normal. Mouth/Throat: Oropharynx is clear and moist. No oropharyngeal exudate or tonsillar swelling.  Eyes: Conjunctivae and EOM are normal. No scleral icterus. Neck: Normal range of motion. Neck supple. No JVD present. No thyromegaly present.  Cardiovascular: Normal rate, regular rhythm and normal heart sounds.  No murmur heard. No BLE edema. Pulmonary/Chest: Effort normal and breath sounds normal - no wheezes, rhonchi, or crackles. No respiratory distress. Abdominal: Soft. Bowel sounds are normal, no distension. There is no tenderness. No masses. Musculoskeletal: Normal range of motion, no joint effusions. No gross deformities Neurological: Pt is alert and oriented to person, place, and time. No cranial nerve  deficit. Coordination, balance, strength, speech and gait are normal.  Skin: Skin is warm and dry. No rash noted. No erythema.  Psychiatric: Patient has a normal mood and affect. behavior is normal. Judgment and thought content normal.  No results found for this or any previous visit (from the past 72 hour(s)).  PHQ2/9: Depression screen PHQ 2/9 07/31/2018  Decreased Interest 0  Down, Depressed, Hopeless 0  PHQ - 2 Score 0  Altered sleeping 0  Tired, decreased energy 0  Change in appetite 0  Feeling bad or failure about yourself  0  Trouble concentrating 0  Moving slowly or fidgety/restless 0  Suicidal thoughts 0  PHQ-9 Score 0  Difficult doing work/chores Not difficult at all     Fall Risk: Fall Risk  07/31/2018  Falls in the past year? 0   Assessment & Plan  Problem List Items Addressed This Visit      Cardiovascular and Mediastinum   HTN (hypertension)    Stopped lisinopril due to cough; taking 5mg  Amlodipine.      Relevant Medications   amLODipine (NORVASC) 5 MG tablet     Respiratory   COPD (chronic obstructive pulmonary disease) ([place]) - Primary    Refer to pulmonology, start Trelegy      Relevant Medications   albuterol (PROVENTIL HFA;VENTOLIN HFA) 108 (90 Base) MCG/ACT inhaler   ipratropium-albuterol (DUONEB) 0.5-2.5 (3) MG/3ML SOLN   Fluticasone-Umeclidin-Vilant (TRELEGY ELLIPTA) 100-62.5-25 MCG/INH AEPB   Other Relevant Orders   Ambulatory referral to Pulmonology     Other   Class 1 obesity due to excess calories with serious comorbidity and body mass index (BMI) of 34.0 to 34.9 in adult    Discussed importance of 150 minutes of physical activity weekly, eat two servings of fish weekly, eat one serving of tree nuts ( cashews, pistachios, pecans, almonds.Marland Kitchen) every other day, eat 6 servings of fruit/vegetables daily and drink plenty of water and avoid sweet beverages.  CMP per orders.      Relevant Orders   COMPLETE METABOLIC PANEL WITH GFR    Other  Visit Diagnoses    Lipid screening       Relevant Orders   Lipid panel   Encounter for screening for HIV       Relevant Orders   HIV Antibody (routine testing w rflx)   Need for hepatitis C screening test       Relevant Orders   Hepatitis C antibody   Screening for colorectal cancer       Relevant Orders   Ambulatory referral to Gastroenterology   Breast cancer screening by mammogram       Relevant Orders   MM 3D SCREEN BREAST BILATERAL   Need for Tdap vaccination       Relevant Orders   Tdap vaccine greater than or equal to 7yo IM (Completed)

## 2018-07-31 NOTE — Assessment & Plan Note (Signed)
Refer to pulmonology, start Trelegy

## 2018-07-31 NOTE — Assessment & Plan Note (Signed)
Discussed importance of 150 minutes of physical activity weekly, eat two servings of fish weekly, eat one serving of tree nuts ( cashews, pistachios, pecans, almonds.Marland Kitchen) every other day, eat 6 servings of fruit/vegetables daily and drink plenty of water and avoid sweet beverages.  CMP per orders.

## 2018-08-05 ENCOUNTER — Ambulatory Visit: Payer: BLUE CROSS/BLUE SHIELD | Admitting: Pulmonary Disease

## 2018-08-05 ENCOUNTER — Encounter: Payer: Self-pay | Admitting: Pulmonary Disease

## 2018-08-05 ENCOUNTER — Other Ambulatory Visit
Admission: RE | Admit: 2018-08-05 | Discharge: 2018-08-05 | Disposition: A | Discharge status: Home / Self Care | Payer: BLUE CROSS/BLUE SHIELD | Source: Ambulatory Visit | Attending: Pulmonary Disease | Admitting: Pulmonary Disease

## 2018-08-05 VITALS — BP 120/72 | HR 75 | Ht 65.0 in | Wt 212.0 lb

## 2018-08-05 DIAGNOSIS — F17210 Nicotine dependence, cigarettes, uncomplicated: Secondary | ICD-10-CM

## 2018-08-05 DIAGNOSIS — J449 Chronic obstructive pulmonary disease, unspecified: Secondary | ICD-10-CM | POA: Insufficient documentation

## 2018-08-05 DIAGNOSIS — R059 Cough, unspecified: Secondary | ICD-10-CM

## 2018-08-05 DIAGNOSIS — J3089 Other allergic rhinitis: Secondary | ICD-10-CM

## 2018-08-05 DIAGNOSIS — R05 Cough: Secondary | ICD-10-CM

## 2018-08-05 MED ORDER — MONTELUKAST SODIUM 10 MG PO TABS: 10.0000 mg | ORAL_TABLET | Freq: Every day | ORAL | 3 refills | Status: DC

## 2018-08-05 NOTE — Patient Instructions (Signed)
1) Start Singulair (montelukast) 1 tablet daily  2) continue your efforts to quit smoking, you will be provided with the information for the classes.  3) you declined a flu shot today.  4) we will schedule you for breathing tests and allergy tests.  The allergy tests are blood test.  Breathing test will be scheduled for you.  5) continue your Trelegy and your as needed inhaler.  6) we will see you in 4 to 6 weeks time.

## 2018-08-05 NOTE — Progress Notes (Signed)
Subjective:    Patient ID: Cathy Hughes, female    DOB: July 11, 1961, 57 y.o.   MRN: [id number]  HPI  The patient is a 57 year old current smoker (0.5 PPD) who is referred for evaluation and management of COPD. She is kindly referred by Raelyn Ensign, [place].  She states that 4 to 5 years ago she was diagnosed with COPD with asthma but had not had follow-up for this.  She apparently had been on a number of inhalers and her primary care practitioner Raelyn Ensign, FNP, appropriately narrowed down her regimen to Trelegy Ellipta and as needed albuterol.  This is actually working well for her.  She does still have issues with some cough particularly at nighttime when she lays down.  This cough is mostly nonproductive and clears by using her rescue inhaler.  She also has cough in the mornings productive of whitish to yellowish sputum, the quantity and character of the sputum has not changed.  She has not had any hemoptysis.  This cough usually clears after a few minutes.  The patient also has significant issues with environmental allergies with different triggers particularly with odors and fumes.  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  She does not describe any orthopnea or paroxysmal nocturnal dyspnea.  No lower extremity edema.  She does have issues with gastroesophageal reflux but this is only triggered by certain foods which she tries to avoid.  She has no pets in the home.  She has no unusual hobbies.  Has never been in the [employer].  She has not traveled outside of the country.  No exposure to tuberculosis.  Review of Systems  Constitutional: Negative.   HENT: Positive for congestion, postnasal drip and sinus pressure.   Eyes: Negative.   Respiratory: Positive for cough.   Cardiovascular: Negative.   Gastrointestinal: Negative.        She does have occasional heartburn/indigestion as noted above.  Genitourinary:  Negative.   Musculoskeletal: Negative.   Skin: Negative.   Allergic/Immunologic: Positive for environmental allergies.  Neurological: Negative.   Hematological: Negative.   Psychiatric/Behavioral: Negative.   All other systems reviewed and are negative.      Objective:   Physical Exam  Constitutional: She is oriented to person, place, and time. She appears well-developed. She does not appear ill. No distress.  Obese, no acute distress.  She is somewhat anxious.  HENT:  Head: Normocephalic and atraumatic.  Mouth/Throat: No posterior oropharyngeal edema.  Dentures upper and lower.  Nose shows turbinate edema and erythema bilaterally.  Some crusted discharge.  No postnasal drip noted.  Mild clear postnasal drip noted.  Eyes: Pupils are equal, round, and reactive to light. EOM are normal.  Neck: Normal range of motion. Neck supple. No JVD present. No tracheal deviation present. No thyromegaly present.  Cardiovascular: Normal rate, regular rhythm, normal heart sounds and intact distal pulses.  No murmur heard. Pulmonary/Chest: Effort normal. She has no decreased breath sounds. She has no wheezes. She has no rales.  Coarse breath sounds throughout.  Abdominal: Soft. She exhibits no distension.  Musculoskeletal: Normal range of motion.       Right lower leg: Normal. She exhibits no edema.       Left lower leg: Normal. She exhibits no edema.  Lymphadenopathy:    She has no cervical adenopathy.  Neurological: She is alert and oriented to person, place, and time.  No  focal deficits.  Skin: Skin is warm and dry. No rash noted. No cyanosis or erythema. Nails show no clubbing.  Psychiatric: Her behavior is normal. Her mood appears anxious.  Nursing note and vitals reviewed.   Available imaging includes chest x-ray obtained in September 2019.  This shows a small subpleural bleb.  Otherwise negative.      Assessment & Plan:   1) Asthma/COPD overlap syndrome: She appears to be on  appropriate regimen of Trelegy Ellipta and as needed albuterol.  She has not had quantitation of FEV1 lately.  We will obtain PFTs to evaluate this further.  We will see her in follow-up in 4 to 6 weeks time she is to contact us prior to that time should any new difficulties arise.  2) Perennial rhinitis: We will we will obtain IgE and RAST allergen panel.  She will be started on montelukast 10 mg daily.  3) Cough: This is likely due to ongoing tobacco use and airway irritation.  Also have an element of nasal drip syndrome.  We will reassess on follow-up appointment.  4) Tobacco abuse and dependence due to cigarettes: Patient was counseled with regards to discontinuation smoking, total time 3 to 5 minutes.  She was provided with the phone number for the quit classes provided by [facility].  Will discuss lung cancer screening CT during a future appointment.  The patient was somewhat anxious today and somewhat overwhelmed.  5) The patient declined the flu vaccine today.    Thank you for allowing me to participate in this patient's care.

## 2018-08-07 LAB — ALLERGENS W/TOTAL IGE AREA 2
Alternaria Alternata IgE: <0.1 kU/L
Aspergillus Fumigatus IgE: <0.1 kU/L
Bermuda Grass IgE: <0.1 kU/L
Cockroach, German IgE: <0.1 kU/L
Cottonwood IgE: <0.1 kU/L
D Pteronyssinus IgE: 0.82 kU/L — AB
D002-IGE D FARINAE: 0.79 kU/L — AB
E001-IGE CAT DANDER: 0.16 kU/L — AB
IGE (IMMUNOGLOBULIN E), SERUM: 153 [IU]/mL (ref 6–495)
Johnson Grass IgE: <0.1 kU/L
Pecan, Hickory IgE: <0.1 kU/L
Penicillium Chrysogen IgE: <0.1 kU/L
Pigweed, Rough IgE: <0.1 kU/L
Ragweed, Short IgE: <0.1 kU/L
Timothy Grass IgE: 0.15 kU/L — AB
White Mulberry IgE: <0.1 kU/L

## 2018-08-12 ENCOUNTER — Ambulatory Visit
Admission: RE | Admit: 2018-08-12 | Discharge: 2018-08-12 | Disposition: A | Discharge status: Home / Self Care | Payer: BLUE CROSS/BLUE SHIELD | Source: Ambulatory Visit | Attending: Gastroenterology | Admitting: Gastroenterology

## 2018-08-12 ENCOUNTER — Encounter: Payer: Self-pay | Admitting: *Deleted

## 2018-08-12 ENCOUNTER — Encounter: Payer: Self-pay | Admitting: Anesthesiology

## 2018-08-12 ENCOUNTER — Ambulatory Visit: Payer: Self-pay

## 2018-08-12 ENCOUNTER — Encounter
Admission: RE | Disposition: A | Discharge status: Home / Self Care | Payer: Self-pay | Source: Ambulatory Visit | Attending: Gastroenterology

## 2018-08-12 DIAGNOSIS — Z539 Procedure and treatment not carried out, unspecified reason: Secondary | ICD-10-CM | POA: Insufficient documentation

## 2018-08-12 DIAGNOSIS — Z1211 Encounter for screening for malignant neoplasm of colon: Secondary | ICD-10-CM

## 2018-08-12 HISTORY — DX: Prediabetes: R73.03

## 2018-08-12 SURGERY — COLONOSCOPY WITH PROPOFOL
Anesthesia: General

## 2018-08-12 MED ORDER — SODIUM CHLORIDE 0.9 % IV SOLN: INTRAVENOUS | Status: DC

## 2018-08-12 MED ORDER — PROPOFOL 500 MG/50ML IV EMUL
INTRAVENOUS | Status: AC
  Filled 2018-08-12: qty 50

## 2018-08-12 NOTE — Telephone Encounter (Signed)
Pt. Reports she was supposed to have her colonoscopy this morning, "but that medicine made me so sick, I couldn't do it. It made me throw up and made my stomach burn." Made her feel dizzy. Wants to know if "there is some other medicine they could give to clean me out that won't make me sick."  Instructed to call her GI doctor as well. Please advise pt.

## 2018-08-19 ENCOUNTER — Ambulatory Visit (INDEPENDENT_AMBULATORY_CARE_PROVIDER_SITE_OTHER): Payer: BLUE CROSS/BLUE SHIELD | Admitting: Family Medicine

## 2018-08-19 ENCOUNTER — Other Ambulatory Visit (HOSPITAL_COMMUNITY)
Admission: RE | Admit: 2018-08-19 | Discharge: 2018-08-19 | Disposition: A | Discharge status: Home / Self Care | Payer: BLUE CROSS/BLUE SHIELD | Source: Ambulatory Visit | Attending: Family Medicine | Admitting: Family Medicine

## 2018-08-19 ENCOUNTER — Encounter: Payer: Self-pay | Admitting: Family Medicine

## 2018-08-19 VITALS — BP 118/72 | HR 100 | Temp 97.6°F | Resp 16 | Ht 65.0 in | Wt 209.9 lb

## 2018-08-19 DIAGNOSIS — I10 Essential (primary) hypertension: Secondary | ICD-10-CM

## 2018-08-19 DIAGNOSIS — Z01419 Encounter for gynecological examination (general) (routine) without abnormal findings: Secondary | ICD-10-CM | POA: Diagnosis not present

## 2018-08-19 DIAGNOSIS — N393 Stress incontinence (female) (male): Secondary | ICD-10-CM | POA: Diagnosis not present

## 2018-08-19 DIAGNOSIS — G8929 Other chronic pain: Secondary | ICD-10-CM

## 2018-08-19 DIAGNOSIS — Z1211 Encounter for screening for malignant neoplasm of colon: Secondary | ICD-10-CM

## 2018-08-19 DIAGNOSIS — J449 Chronic obstructive pulmonary disease, unspecified: Secondary | ICD-10-CM | POA: Diagnosis not present

## 2018-08-19 DIAGNOSIS — Z13820 Encounter for screening for osteoporosis: Secondary | ICD-10-CM

## 2018-08-19 DIAGNOSIS — M5441 Lumbago with sciatica, right side: Secondary | ICD-10-CM

## 2018-08-19 DIAGNOSIS — Z1212 Encounter for screening for malignant neoplasm of rectum: Secondary | ICD-10-CM

## 2018-08-19 DIAGNOSIS — Z8739 Personal history of other diseases of the musculoskeletal system and connective tissue: Secondary | ICD-10-CM

## 2018-08-19 LAB — HM PAP SMEAR: HM Pap smear: NORMAL

## 2018-08-19 MED ORDER — AMLODIPINE BESYLATE 5 MG PO TABS: 5.0000 mg | ORAL_TABLET | Freq: Every day | ORAL | 0 refills | Status: DC

## 2018-08-19 MED ORDER — FLUTICASONE-UMECLIDIN-VILANT 100-62.5-25 MCG/INH IN AEPB: 1.0000 | INHALATION_SPRAY | Freq: Every day | RESPIRATORY_TRACT | 3 refills | Status: DC

## 2018-08-19 MED ORDER — TIZANIDINE HCL 4 MG PO TABS: 2.0000 mg | ORAL_TABLET | Freq: Four times a day (QID) | ORAL | 0 refills | Status: DC | PRN

## 2018-08-19 NOTE — Patient Instructions (Addendum)
-   Please schedule your mammogram.   Kegel Exercises Kegel exercises help strengthen the muscles that support the rectum, vagina, small intestine, bladder, and uterus. Doing Kegel exercises can help:  Improve bladder and bowel control.  Improve sexual response.  Reduce problems and discomfort during pregnancy.  Kegel exercises involve squeezing your pelvic floor muscles, which are the same muscles you squeeze when you try to stop the flow of urine. The exercises can be done while sitting, standing, or lying down, but it is best to vary your position. Phase 1 exercises 1. Squeeze your pelvic floor muscles tight. You should feel a tight lift in your rectal area. If you are a female, you should also feel a tightness in your vaginal area. Keep your stomach, buttocks, and legs relaxed. 2. Hold the muscles tight for up to 10 seconds. 3. Relax your muscles. Repeat this exercise 50 times a day or as many times as told by your health care provider. Continue to do this exercise for at least 4-6 weeks or for as long as told by your health care provider. This information is not intended to replace advice given to you by your health care provider. Make sure you discuss any questions you have with your health care provider. Document Released: 08/14/2012 Document Revised: 04/22/2016 Document Reviewed: 07/18/2015 Elsevier Interactive Patient Education  [employer].

## 2018-08-19 NOTE — Progress Notes (Signed)
Name: Cathy Hughes   MRN: [id number]    DOB: September 10, 1961   Date:08/19/2018       Progress Note  Subjective  Chief Complaint  No chief complaint on file.   HPI  Patient presents for annual CPE.  Chronic Low back pain: Previous PCP used to Rx muscle relaxer for bilateral low back pain with right-sided sciatica.  This flares intermittently; no BLE weakness.  Diet: Balanced. Exercise: She is exercising - jumping jacks, sit-ups, working at night and walking regularly.  USPSTF grade A and B recommendations    Office Visit from 07/31/2018 in [facility]  AUDIT-C Score  1     Depression:  Depression screen [facility] 2/9 07/31/2018  Decreased Interest 0  Down, Depressed, Hopeless 0  PHQ - 2 Score 0  Altered sleeping 0  Tired, decreased energy 0  Change in appetite 0  Feeling bad or failure about yourself  0  Trouble concentrating 0  Moving slowly or fidgety/restless 0  Suicidal thoughts 0  PHQ-9 Score 0  Difficult doing work/chores Not difficult at all   Hypertension: BP Readings from Last 3 Encounters:  08/05/18 120/72  07/31/18 130/84  06/04/18 (!) 144/92   Obesity: Wt Readings from Last 3 Encounters:  08/05/18 212 lb (96.2 kg)  07/31/18 210 lb 3.2 oz (95.3 kg)  06/03/18 178 lb (80.7 kg)   BMI Readings from Last 3 Encounters:  08/05/18 35.28 kg/m  07/31/18 34.98 kg/m  06/03/18 29.62 kg/m    Hep C Screening: Pending STD testing and prevention (HIV/chl/gon/syphilis): HIV pending; No new partners in the last year. Intimate partner violence: No concerns Sexual History/Pain during Intercourse: No concerns Menstrual History/LMP/Abnormal Bleeding: No bleeding. She does note that she has history of fibroids. Incontinence Symptoms: Some mild incontinence when she coughs.  Advanced Care Planning: A voluntary discussion about advance care planning including the explanation and discussion of advance directives.  Discussed health care proxy and Living  will, and the patient was able to identify a health care proxy as Adison Reifsteck (Husband).  Patient does not have a living will at present time. If patient does have living will, I have requested they bring this to the clinic to be scanned in to their chart.  Breast cancer: Needs to have this done - has history of lumpectomy (LEFT) No results found for: HMMAMMO  BRCA gene screening: Great Aunt (Maternal), no one else in the family. Cervical cancer screening: We will do pap today  Osteoporosis Screening: no family history; we will order DEXA today. No results found for: HMDEXASCAN  Lipids: Ordered and pending No results found for: CHOL No results found for: HDL No results found for: LDLCALC No results found for: TRIG No results found for: CHOLHDL No results found for: LDLDIRECT  Glucose: Ordered and pending Glucose, Bld  Date Value Ref Range Status  06/03/2018 132 (H) 70 - 99 mg/dL Final  06/26/2017 100 (H) 65 - 99 mg/dL Final  05/17/2017 118 (H) 65 - 99 mg/dL Final   Skin cancer: No concerning moles or lesions Colorectal cancer: Denies family or personal history of colorectal cancer, no changes in BM's - no blood in stool, dark and tarry stool, mucus in stool, or constipation/diarrhea.  She had colonoscopy scheduled, but the prep made her very sick, so she canceled.  Lung cancer:  1/2ppd since age 57 - has always been 1/2ppd - 68.5year pack years. Low Dose CT Chest recommended if Age 9-80 years, 30 pack-year currently smoking OR have  quit w/in 15years. Patient does not qualify.   ECG: Not indicated; no chest pain or shortness of breath; several EKG's on file.  Patient Active Problem List   Diagnosis Date Noted  . COPD (chronic obstructive pulmonary disease) ([place]) 07/31/2018  . Class 1 obesity due to excess calories with serious comorbidity and body mass index (BMI) of 34.0 to 34.9 in adult 07/31/2018  . HTN (hypertension) 12/09/2013    Past Surgical History:  Procedure Laterality  Date  . CHOLECYSTECTOMY    . COLON SURGERY    . thumb surgery      Family History  Problem Relation Age of Onset  . Diabetes Mother   . Hypertension Mother   . Diabetes Father   . Hypertension Father     Social History   Socioeconomic History  . Marital status: Married    Spouse name: [place]  . Number of children: 3  . Years of education: Not on file  . Highest education level: Not on file  Occupational History  . Not on file  Social Needs  . Financial resource strain: Not hard at all  . Food insecurity:    Worry: Never true    Inability: Never true  . Transportation needs:    Medical: No    Non-medical: No  Tobacco Use  . Smoking status: Current Some Day Smoker    Packs/day: 0.50    Types: Cigarettes  . Smokeless tobacco: Never Used  Substance and Sexual Activity  . Alcohol use: Yes    Comment: occ  . Drug use: No  . Sexual activity: Yes    Partners: Male  Lifestyle  . Physical activity:    Days per week: 5 days    Minutes per session: 40 min  . Stress: Only a little  Relationships  . Social connections:    Talks on phone: More than three times a week    Gets together: More than three times a week    Attends religious service: More than 4 times per year    Active member of club or organization: Yes    Attends meetings of clubs or organizations: More than 4 times per year    Relationship status: Married  . Intimate partner violence:    Fear of current or ex partner: No    Emotionally abused: No    Physically abused: No    Forced sexual activity: No  Other Topics Concern  . Not on file  Social History Narrative  . Not on file     Current Outpatient Medications:  .  albuterol (PROVENTIL HFA;VENTOLIN HFA) 108 (90 Base) MCG/ACT inhaler, Inhale 2-4 puffs by mouth every 4 hours as needed for wheezing, cough, and/or shortness of breath, Disp: 1 Inhaler, Rfl: 1 .  amLODipine (NORVASC) 5 MG tablet, Take 1 tablet (5 mg total) by mouth daily., Disp: 90  tablet, Rfl: 0 .  Fluticasone-Umeclidin-Vilant (TRELEGY ELLIPTA) 100-62.5-25 MCG/INH AEPB, Inhale 1 puff into the lungs daily., Disp: 1 each, Rfl: 0 .  ipratropium-albuterol (DUONEB) 0.5-2.5 (3) MG/3ML SOLN, Take 3 mLs by nebulization every 6 (six) hours as needed., Disp: 180 mL, Rfl: 0 .  montelukast (SINGULAIR) 10 MG tablet, Take 1 tablet (10 mg total) by mouth daily., Disp: 90 tablet, Rfl: 3  Allergies  Allergen Reactions  . Aspirin Hives     ROS  Constitutional: Negative for fever or weight change.  Respiratory: Negative for cough and shortness of breath.   Cardiovascular: Negative for chest pain or palpitations.  Gastrointestinal: Negative for abdominal pain, no bowel changes.  Musculoskeletal: Negative for gait problem or joint swelling. See HPI Skin: Negative for rash.  Neurological: Negative for dizziness or headache.  No other specific complaints in a complete review of systems (except as listed in HPI above).  Objective  There were no vitals filed for this visit.  There is no height or weight on file to calculate BMI.  Physical Exam Constitutional: Patient appears well-developed and well-nourished. No distress.  HENT: Head: Normocephalic and atraumatic. Ears: B TMs ok, no erythema or effusion; Nose: Nose normal. Mouth/Throat: Oropharynx is clear and moist. No oropharyngeal exudate.  Eyes: Conjunctivae and EOM are normal. Pupils are equal, round, and reactive to light. No scleral icterus.  Neck: Normal range of motion. Neck supple. No JVD present. No thyromegaly present.  Cardiovascular: Normal rate, regular rhythm and normal heart sounds.  No murmur heard. No BLE edema. Pulmonary/Chest: Effort normal and breath sounds normal. No respiratory distress. Abdominal: Soft. Bowel sounds are normal, no distension. There is no tenderness. no masses Breast: no lumps or masses, no nipple discharge or rashes FEMALE GENITALIA:  External genitalia normal External urethra  normal Vaginal vault normal without discharge or lesions Cervix normal without discharge or lesions Bimanual exam normal without masses RECTAL: no rectal masses or hemorrhoids Musculoskeletal: Normal range of motion, no joint effusions. No gross deformities Neurological: he is alert and oriented to person, place, and time. No cranial nerve deficit. Coordination, balance, strength, speech and gait are normal.  Skin: Skin is warm and dry. No rash noted. No erythema.  Psychiatric: Patient has a normal mood and affect. behavior is normal. Judgment and thought content normal.   Recent Results (from the past 2160 hour(s))  CBC with Differential     Status: Abnormal   Collection Time: 06/03/18 11:59 PM  Result Value Ref Range   WBC 7.3 3.6 - 11.0 K/uL   RBC 4.66 3.80 - 5.20 MIL/uL   Hemoglobin 12.9 12.0 - 16.0 g/dL   HCT 37.3 35.0 - 47.0 %   MCV 80.0 80.0 - 100.0 fL   MCH 27.6 26.0 - 34.0 pg   MCHC 34.5 32.0 - 36.0 g/dL   RDW 15.8 (H) 11.5 - 14.5 %   Platelets 360 150 - 440 K/uL   Neutrophils Relative % 63 %   Neutro Abs 4.6 1.4 - 6.5 K/uL   Lymphocytes Relative 29 %   Lymphs Abs 2.1 1.0 - 3.6 K/uL   Monocytes Relative 5 %   Monocytes Absolute 0.4 0.2 - 0.9 K/uL   Eosinophils Relative 2 %   Eosinophils Absolute 0.1 0 - 0.7 K/uL   Basophils Relative 1 %   Basophils Absolute 0.1 0 - 0.1 K/uL    Comment: Performed at [facility], [address] [place], [place] [address]  Comprehensive metabolic panel     Status: Abnormal   Collection Time: 06/03/18 11:59 PM  Result Value Ref Range   Sodium 140 135 - 145 mmol/L   Potassium 3.2 (L) 3.5 - 5.1 mmol/L   Chloride 109 98 - 111 mmol/L   CO2 25 22 - 32 mmol/L   Glucose, Bld 132 (H) 70 - 99 mg/dL   BUN 7 6 - 20 mg/dL   Creatinine, Ser 0.91 0.44 - 1.00 mg/dL   Calcium 8.9 8.9 - 10.3 mg/dL   Total Protein 7.8 6.5 - 8.1 g/dL   Albumin 3.9 3.5 - 5.0 g/dL   AST 27 15 - 41 U/L  ALT 20 0 - 44 U/L   Alkaline Phosphatase 80 38 -  126 U/L   Total Bilirubin 0.2 (L) 0.3 - 1.2 mg/dL   GFR calc non Af Amer >60 >60 mL/min   GFR calc Af Amer >60 >60 mL/min    Comment: (NOTE) The eGFR has been calculated using the CKD EPI equation. This calculation has not been validated in all clinical situations. eGFR's persistently <60 mL/min signify possible Chronic Kidney Disease.    Anion gap 6 5 - 15    Comment: Performed at [facility], [address] [place], [place] [address]  Troponin I     Status: None   Collection Time: 06/03/18 11:59 PM  Result Value Ref Range   Troponin I <0.03 <0.03 ng/mL    Comment: Performed at [facility], [address] [place], [place] [address]  Allergens w/Total IgE Area 2     Status: Abnormal   Collection Time: 08/05/18 12:23 PM  Result Value Ref Range   Class Description Comment     Comment: (NOTE)    Levels of Specific IgE       Class  Description of Class    ---------------------------  -----  --------------------                   < 0.10         0         Negative           0.10 -    0.31         0/I       Equivocal/Low           0.32 -    0.55         I         Low           0.56 -    1.40         II        Moderate           1.41 -    3.90         III       High           3.91 -   19.00         IV        Very High          19.01 -  100.00         V         Very High                  >100.00         VI        Very High    IgE (Immunoglobulin E), Serum 153 6 - 495 IU/mL   D Pteronyssinus IgE 0.82 (A) Class II kU/L   D Farinae IgE 0.79 (A) Class II kU/L   Cat Dander IgE 0.16 (A) Class 0/I kU/L   Dog Dander IgE <0.10 Class 0 kU/L   [place] Grass IgE <0.10 Class 0 kU/L   Timothy Grass IgE 0.15 (A) Class 0/I kU/L   Johnson Grass IgE <0.10 Class 0 kU/L   Cockroach, German IgE <0.10 Class 0 kU/L   Penicillium Chrysogen IgE <0.10 Class 0 kU/L   Cladosporium Herbarum IgE <0.10 Class 0 kU/L   Aspergillus Fumigatus IgE <0.10 Class 0 kU/L   Alternaria Alternata IgE  <  0.10 Class 0 kU/L   Maple/Box Elder IgE <0.10 Class 0 kU/L   Common Silver Wendee Copp IgE <0.10 Class 0 kU/L   Cedar, Georgia IgE <0.10 Class 0 kU/L   Oak, White IgE <0.10 Class 0 kU/L   Elm, American IgE <0.10 Class 0 kU/L   Cottonwood IgE <0.10 Class 0 kU/L   Pecan, Hickory IgE <0.10 Class 0 kU/L   White Mulberry IgE <0.10 Class 0 kU/L   Ragweed, Short IgE <0.10 Class 0 kU/L   Pigweed, Rough IgE <0.10 Class 0 kU/L   Sheep Sorrel IgE Qn <0.10 Class 0 kU/L   Mouse Urine IgE <0.10 Class 0 kU/L    Comment: (NOTE) Performed At: [facility] [place], [place] [id number] Rush Farmer MD DA:[id number]     PHQ2/9: Depression screen [facility] 2/9 07/31/2018  Decreased Interest 0  Down, Depressed, Hopeless 0  PHQ - 2 Score 0  Altered sleeping 0  Tired, decreased energy 0  Change in appetite 0  Feeling bad or failure about yourself  0  Trouble concentrating 0  Moving slowly or fidgety/restless 0  Suicidal thoughts 0  PHQ-9 Score 0  Difficult doing work/chores Not difficult at all    Fall Risk: Fall Risk  07/31/2018  Falls in the past year? 0   Assessment & Plan  1. Well woman exam  -USPSTF grade A and B recommendations reviewed with patient; age-appropriate recommendations, preventive care, screening tests, etc discussed and encouraged; healthy living encouraged; see AVS for patient education given to patient -Discussed importance of 150 minutes of physical activity weekly, eat two servings of fish weekly, eat one serving of tree nuts ( cashews, pistachios, pecans, almonds.Marland Kitchen) every other day, eat 6 servings of fruit/vegetables daily and drink plenty of water and avoid sweet beverages.  - Cytology - PAP - HM DEXA SCAN  2. Chronic obstructive pulmonary disease, unspecified COPD type ([place]) - Keep follow up with pulmonology - Fluticasone-Umeclidin-Vilant (TRELEGY ELLIPTA) 100-62.5-25 MCG/INH AEPB; Inhale 1 puff into the lungs daily.  Dispense: 1 each; Refill:  3  3. Chronic bilateral low back pain with right-sided sciatica - tiZANidine (ZANAFLEX) 4 MG tablet; Take 0.5-1 tablets (2-4 mg total) by mouth every 6 (six) hours as needed for muscle spasms.  Dispense: 30 tablet; Refill: 0 - Ambulatory referral to Pain Clinic  4. Stress incontinence - She declines referral today  5. Osteoporosis screening - HM DEXA SCAN  6. History of fibromyalgia - Ambulatory referral to Pain Clinic  7. Encounter for colorectal cancer screening - Cologuard  8. Essential hypertension - amLODipine (NORVASC) 5 MG tablet; Take 1 tablet (5 mg total) by mouth daily.  Dispense: 90 tablet; Refill: 0

## 2018-08-20 LAB — CYTOLOGY - PAP
Chlamydia: NEGATIVE
Diagnosis: NEGATIVE
HPV: NOT DETECTED
Neisseria Gonorrhea: NEGATIVE

## 2018-08-21 ENCOUNTER — Encounter: Payer: Self-pay | Admitting: Emergency Medicine

## 2018-08-21 NOTE — Progress Notes (Signed)
HM has been updated per the request of Raelyn Ensign, FNP

## 2018-08-27 ENCOUNTER — Ambulatory Visit: Payer: BLUE CROSS/BLUE SHIELD

## 2018-08-29 ENCOUNTER — Other Ambulatory Visit: Payer: Self-pay | Admitting: Family Medicine

## 2018-08-29 DIAGNOSIS — J449 Chronic obstructive pulmonary disease, unspecified: Secondary | ICD-10-CM

## 2018-08-30 ENCOUNTER — Ambulatory Visit: Payer: Self-pay

## 2018-08-30 DIAGNOSIS — M5441 Lumbago with sciatica, right side: Principal | ICD-10-CM

## 2018-08-30 DIAGNOSIS — G8929 Other chronic pain: Secondary | ICD-10-CM

## 2018-08-30 MED ORDER — PREDNISONE 5 MG (48) PO TBPK: ORAL_TABLET | ORAL | 0 refills | Status: DC

## 2018-08-30 NOTE — Telephone Encounter (Signed)
Called patient, VM is not set up. CRM created.

## 2018-08-30 NOTE — Telephone Encounter (Signed)
Pt.notified

## 2018-08-30 NOTE — Telephone Encounter (Signed)
12-day prednisone taper is sent in to help with her pain while she awaits pain management.  She also needs to come in for labs - please remind her. Thanks!

## 2018-08-30 NOTE — Addendum Note (Signed)
Addended by: Hubbard Hartshorn on: 08/30/2018 01:46 PM   Modules accepted: Orders

## 2018-08-30 NOTE — Telephone Encounter (Signed)
Returned call to pt.  Was seen in office 08/19/18, and continues to c/o bilateral lower back pain that goes into both legs; c/o intermittent numbness of legs.  Rated pain at 10/10.  Reported unable to get any rest at night.  Has been taking Tizanidine 4 mg, 1 tablet, and has not gotten any relief.  Is requesting a different Rx be sent in for pain. (noted pt. has been referred to Pain Management, and has an appt. 10/03/18)  Advised will send request to [facility]  Pt. Agreed with plan.        Reason for Disposition . Caller has NON-URGENT medication question about med that PCP prescribed and triager unable to answer question  Answer Assessment - Initial Assessment Questions 1. SYMPTOMS: "Do you have any symptoms?"     Bilateral lower back pain that goes into both legs 2. SEVERITY: If symptoms are present, ask "Are they mild, moderate or severe?"     Rated at 10/10; stated the Tizanidine is not easing the pain  Protocols used: MEDICATION QUESTION CALL-A-AH  Message from Rayann Heman sent at 08/30/2018 12:13 PM EST   Summary: medication not working    Pt called and stated that tiZANidine (ZANAFLEX) 4 MG tablet [[id number]] is not working and would like some advise on what she should do. Please advise

## 2018-09-02 ENCOUNTER — Encounter: Payer: Self-pay | Admitting: Pulmonary Disease

## 2018-09-02 ENCOUNTER — Ambulatory Visit: Payer: BLUE CROSS/BLUE SHIELD | Admitting: Pulmonary Disease

## 2018-09-02 ENCOUNTER — Ambulatory Visit: Payer: Self-pay

## 2018-09-02 VITALS — BP 122/80 | HR 74 | Ht 65.0 in | Wt 211.4 lb

## 2018-09-02 DIAGNOSIS — F17210 Nicotine dependence, cigarettes, uncomplicated: Secondary | ICD-10-CM | POA: Diagnosis not present

## 2018-09-02 DIAGNOSIS — J449 Chronic obstructive pulmonary disease, unspecified: Secondary | ICD-10-CM

## 2018-09-02 DIAGNOSIS — R059 Cough, unspecified: Secondary | ICD-10-CM

## 2018-09-02 DIAGNOSIS — J3089 Other allergic rhinitis: Secondary | ICD-10-CM

## 2018-09-02 DIAGNOSIS — R05 Cough: Secondary | ICD-10-CM | POA: Diagnosis not present

## 2018-09-02 MED ORDER — OMEPRAZOLE 20 MG PO CPDR: 20.0000 mg | DELAYED_RELEASE_CAPSULE | Freq: Two times a day (BID) | ORAL | 2 refills | Status: DC

## 2018-09-02 NOTE — Telephone Encounter (Signed)
Documentation is reviewed. She may schedule an appointment in our clinic or go to urgent care. Thank you.

## 2018-09-02 NOTE — Telephone Encounter (Signed)
See Telephone  Encounter from 11:31am.

## 2018-09-02 NOTE — Telephone Encounter (Signed)
It has only been 3 days on a 12-day taper, she needs to continue the medication and give it some time to work.  She is already prescribed Zanaflex, which she may take as needed.

## 2018-09-02 NOTE — Progress Notes (Signed)
Subjective:    Patient ID: Cathy Hughes, female    DOB: [birth year], 57 y.o.   MRN: [id number]  HPI Patient is a 57 year old current smoker (0.5 PPD) who follows here for the management of COPD.  She was initially seen here on 25 November.  She presented only with the symptom of cough.  She is on Trelegy Ellipta and as needed albuterol.  She notes that she has had some improvement on her cough since starting on montelukast for perennial rhinitis.  She does however continue to have some issues particularly at nighttime when recumbent.  She had allergy testing which showed that she is sensitive to dust mites and house dust.  She also should avoid cats due to sensitivity to cat dander.  She has not had any fevers, chills or sweats.  As noted she has shown partial improvement of her symptoms.  PFTs have not been performed yet.  Is were requested at her prior visit.   Review of Systems  Constitutional: Negative.   HENT: Positive for congestion and postnasal drip.   Eyes: Negative.   Respiratory: Positive for cough.   Cardiovascular: Negative.   Gastrointestinal: Negative.   Skin: Negative.   Allergic/Immunologic: Positive for environmental allergies.  Hematological: Negative.   Psychiatric/Behavioral: Negative.   All other systems reviewed and are negative.      Objective:   Physical Exam Constitutional: She is oriented to person, place, and time. She appears well-developed. She does not appear ill. No distress.  Obese, no acute distress.  HENT:  Head: Normocephalic and atraumatic.  Mouth/Throat: No posterior oropharyngeal edema.  Dentures upper and lower. Some crusted discharge.  No postnasal drip noted.  Eyes: Pupils are equal, round, and reactive to light. EOM are normal.  Neck: Normal range of motion. Neck supple. No JVD present. No tracheal deviation present. No thyromegaly present.  Cardiovascular: Normal rate, regular rhythm, normal heart sounds and intact distal pulses.  No  murmur heard. Pulmonary/Chest: Effort normal. She has no decreased breath sounds. She has no wheezes. She has no rales.  Coarse breath sounds throughout.  Abdominal: Soft. She exhibits no distension.  Musculoskeletal: Normal range of motion.  She exhibits no edema bilaterally.  Lymphadenopathy:    She has no cervical adenopathy.  Neurological: She is alert and oriented to person, place, and time.  No focal deficits.  Skin: Skin is warm and dry. No rash noted. No cyanosis or erythema. Nails show no clubbing.  Psychiatric: Her behavior is normal. Her mood is normal. Vital signs and nursing note reviewed.     Assessment & Plan:   1) Asthma/COPD overlap syndrome: She appears to be on appropriate regimen of Trelegy Ellipta and as needed albuterol.  She has not had quantitation of FEV1 lately.  PFTs were requested during her initial visit however not performed yet.  We will notify her of the results once these are known.    2) Perennial rhinitis: Continue montelukast 10 mg daily.  She is showing some improvement in this regard.  3) Cough: This is likely due to ongoing tobacco use and airway irritation.  Also have an element of post nasal drip syndrome.  This has improved but continues to have issues while recumbent.  We will give her a short trial of omeprazole twice a day to see if reflux is aggravating this issue (laryngopharyngeal reflux).  4) Tobacco abuse and dependence due to cigarettes: Patient was counseled with regards to discontinuation smoking, total time 3 to 5 minutes.  We will have her consider low-dose CT scan of the chest for lung cancer screening.  Most recent chest imaging was a chest x-ray September 2019 that showed no abnormalities.  5) The patient declined the flu vaccine today.

## 2018-09-02 NOTE — Telephone Encounter (Signed)
Incoming call from Patient stating that Rx of Prednisone is not working.  Patient request a muscle relaxer. Pain is going down the middle of her back rated 10. On scale of 1-10 Pharmacy is [facility] on [address]

## 2018-09-02 NOTE — Patient Instructions (Signed)
1.  We are to start omeprazole 1 capsule twice a day.  2.  You are encouraged to quit smoking.  3.  We will see him in follow-up in 3 months time however will call you as soon as we get the results of the breathing test.

## 2018-09-02 NOTE — Telephone Encounter (Signed)
Pt states that she never picked up prednisone, that it does not work for her, she is not taking it and will not take it. Patient states she has been taking the zanaflex for 2 weeks and states that if you can't call her anything in she will go to the hospital where she knows they will give her something.

## 2018-09-03 NOTE — Telephone Encounter (Signed)
Unable to leave VM, CRM created.

## 2018-09-05 ENCOUNTER — Ambulatory Visit: Payer: BLUE CROSS/BLUE SHIELD | Attending: Pulmonary Disease

## 2018-09-26 ENCOUNTER — Other Ambulatory Visit: Payer: Self-pay | Admitting: Nurse Practitioner

## 2018-09-26 DIAGNOSIS — Z1231 Encounter for screening mammogram for malignant neoplasm of breast: Secondary | ICD-10-CM

## 2018-10-03 ENCOUNTER — Ambulatory Visit: Payer: Self-pay | Admitting: Student in an Organized Health Care Education/Training Program

## 2018-11-01 ENCOUNTER — Telehealth: Payer: Self-pay | Admitting: Family Medicine

## 2018-11-01 NOTE — Telephone Encounter (Signed)
Please call patient and remind her to send her cologuard back in.  She was also supposed to schedule a follow up, and I see that she has not been back in for a visit.  Please schedule follow up in the next 1-2 weeks.

## 2018-11-04 ENCOUNTER — Ambulatory Visit: Payer: Self-pay | Admitting: Family Medicine

## 2018-11-05 NOTE — Telephone Encounter (Signed)
Left message for patient

## 2018-11-14 ENCOUNTER — Telehealth: Payer: Self-pay | Admitting: Family Medicine

## 2018-11-14 DIAGNOSIS — I10 Essential (primary) hypertension: Secondary | ICD-10-CM

## 2018-11-14 NOTE — Telephone Encounter (Signed)
LVM for pt to call the office to schedule an appt for med refill °

## 2018-11-14 NOTE — Telephone Encounter (Signed)
Please call patient to schedule follow up

## 2018-11-14 NOTE — Telephone Encounter (Signed)
Refill request for Hypertension medication:  Amlodipine 5 mg  Last office visit pertaining to hypertension: 07/31/2018  BP Readings from Last 3 Encounters:  09/02/18 122/80  08/19/18 118/72  08/05/18 120/72   Lab Results  Component Value Date   CREATININE 0.91 06/03/2018   BUN 7 06/03/2018   NA 140 06/03/2018   K 3.2 (L) 06/03/2018   CL 109 06/03/2018   CO2 25 06/03/2018    Follow-ups on file. None indicated

## 2018-12-13 ENCOUNTER — Telehealth: Payer: Self-pay | Admitting: Family Medicine

## 2018-12-13 DIAGNOSIS — J449 Chronic obstructive pulmonary disease, unspecified: Secondary | ICD-10-CM

## 2018-12-13 NOTE — Telephone Encounter (Signed)
Please call patient - she needs follow up in the next 4-6 weeks, may do virtual visit.

## 2018-12-13 NOTE — Telephone Encounter (Signed)
No answer and no option for VM

## 2018-12-16 NOTE — Telephone Encounter (Signed)
Spoke with pt and gave appt.

## 2019-01-28 ENCOUNTER — Other Ambulatory Visit: Payer: Self-pay

## 2019-01-28 ENCOUNTER — Ambulatory Visit: Payer: BLUE CROSS/BLUE SHIELD | Admitting: Family Medicine

## 2019-02-04 ENCOUNTER — Ambulatory Visit: Payer: BLUE CROSS/BLUE SHIELD | Admitting: Family Medicine

## 2019-03-24 ENCOUNTER — Other Ambulatory Visit: Payer: Self-pay | Admitting: Family Medicine

## 2019-03-24 DIAGNOSIS — I10 Essential (primary) hypertension: Secondary | ICD-10-CM

## 2019-03-25 NOTE — Telephone Encounter (Signed)
Tried to call pt to schedule her appt, both #'s call cannot be completed at this time

## 2019-05-03 ENCOUNTER — Telehealth: Payer: Self-pay | Admitting: Family Medicine

## 2019-05-03 DIAGNOSIS — I10 Essential (primary) hypertension: Secondary | ICD-10-CM

## 2019-05-03 NOTE — Telephone Encounter (Signed)
Requested medication (s) are due for refill today   YES  Requested medication (s) are on the active medication list   YES Future visit scheduled    NO. Future visit cancelled by patient.  LOV 08/19/18   Requested Prescriptions  Pending Prescriptions Disp Refills   amLODipine (NORVASC) 5 MG tablet [Pharmacy Med Name: AMLODIPINE BESYLATE 5MG  TABLETS] 30 tablet 0    Sig: TAKE 1 TABLET BY MOUTH EVERY DAY     Cardiovascular:  Calcium Channel Blockers Failed - 05/03/2019 12:29 PM      Failed - Valid encounter within last 6 months    Recent Outpatient Visits          8 months ago Well woman exam   [facility] Hartshorn, FNP   9 months ago Chronic obstructive pulmonary disease, unspecified COPD type [facility])   [place], [place]             Passed - Last BP in normal range    BP Readings from Last 1 Encounters:  09/02/18 122/80

## 2019-05-08 ENCOUNTER — Other Ambulatory Visit: Payer: Self-pay | Admitting: Family Medicine

## 2019-05-08 DIAGNOSIS — I10 Essential (primary) hypertension: Secondary | ICD-10-CM

## 2019-05-09 NOTE — Telephone Encounter (Signed)
Requested medication (s) are due for refill today: yes  Requested medication (s) are on the active medication list:yes  Last refill:  02/21/2019 Future visit scheduled: no  Notes to clinic: review for refill   Requested Prescriptions  Pending Prescriptions Disp Refills   amLODipine (NORVASC) 5 MG tablet [Pharmacy Med Name: AMLODIPINE BESYLATE 5MG  TABLETS] 30 tablet 0    Sig: TAKE 1 TABLET BY MOUTH EVERY DAY     Cardiovascular:  Calcium Channel Blockers Failed - 05/08/2019  7:34 PM      Failed - Valid encounter within last 6 months    Recent Outpatient Visits          8 months ago Well woman exam   [place], FNP   9 months ago Chronic obstructive pulmonary disease, unspecified COPD type [facility])   [place], [place]             Passed - Last BP in normal range    BP Readings from Last 1 Encounters:  09/02/18 122/80

## 2019-05-14 ENCOUNTER — Telehealth: Payer: Self-pay | Admitting: Emergency Medicine

## 2019-05-14 DIAGNOSIS — I10 Essential (primary) hypertension: Secondary | ICD-10-CM

## 2019-05-14 MED ORDER — AMLODIPINE BESYLATE 5 MG PO TABS: 5.0000 mg | ORAL_TABLET | Freq: Every day | ORAL | 0 refills | Status: DC

## 2019-05-14 NOTE — Telephone Encounter (Signed)
Please schedule patient for follow up in the next 15 days.  

## 2019-05-15 NOTE — Telephone Encounter (Signed)
Tried to call pt to schedule an appt and call could not be completed

## 2019-05-21 NOTE — Telephone Encounter (Signed)
Tried calling pt to schedule. Call could not go through

## 2019-08-11 ENCOUNTER — Other Ambulatory Visit: Payer: Self-pay | Admitting: Pulmonary Disease

## 2019-08-25 ENCOUNTER — Encounter: Payer: BLUE CROSS/BLUE SHIELD | Admitting: Family Medicine

## 2019-09-02 ENCOUNTER — Ambulatory Visit: Payer: HRSA Program | Attending: Internal Medicine

## 2019-09-02 DIAGNOSIS — Z20822 Contact with and (suspected) exposure to covid-19: Secondary | ICD-10-CM

## 2019-09-02 DIAGNOSIS — Z20828 Contact with and (suspected) exposure to other viral communicable diseases: Secondary | ICD-10-CM | POA: Diagnosis present

## 2019-09-04 LAB — NOVEL CORONAVIRUS, NAA: SARS-CoV-2, NAA: NOT DETECTED

## 2019-10-05 ENCOUNTER — Other Ambulatory Visit: Payer: Self-pay

## 2019-10-05 ENCOUNTER — Emergency Department: Payer: Self-pay

## 2019-10-05 ENCOUNTER — Emergency Department
Admission: EM | Admit: 2019-10-05 | Discharge: 2019-10-05 | Disposition: A | Discharge status: Home / Self Care | Payer: Self-pay | Attending: Emergency Medicine | Admitting: Emergency Medicine

## 2019-10-05 ENCOUNTER — Encounter: Payer: Self-pay | Admitting: Emergency Medicine

## 2019-10-05 ENCOUNTER — Inpatient Hospital Stay
Admission: EM | Admit: 2019-10-05 | Discharge: 2019-10-16 | DRG: 870 | Disposition: A | Discharge status: Home Health | Payer: Self-pay | Attending: Internal Medicine | Admitting: Internal Medicine

## 2019-10-05 DIAGNOSIS — R4182 Altered mental status, unspecified: Secondary | ICD-10-CM | POA: Diagnosis present

## 2019-10-05 DIAGNOSIS — Z9289 Personal history of other medical treatment: Secondary | ICD-10-CM

## 2019-10-05 DIAGNOSIS — J154 Pneumonia due to other streptococci: Secondary | ICD-10-CM | POA: Diagnosis present

## 2019-10-05 DIAGNOSIS — E872 Acidosis: Secondary | ICD-10-CM | POA: Diagnosis present

## 2019-10-05 DIAGNOSIS — J9600 Acute respiratory failure, unspecified whether with hypoxia or hypercapnia: Secondary | ICD-10-CM

## 2019-10-05 DIAGNOSIS — J45909 Unspecified asthma, uncomplicated: Secondary | ICD-10-CM | POA: Insufficient documentation

## 2019-10-05 DIAGNOSIS — R52 Pain, unspecified: Secondary | ICD-10-CM

## 2019-10-05 DIAGNOSIS — B370 Candidal stomatitis: Secondary | ICD-10-CM | POA: Diagnosis present

## 2019-10-05 DIAGNOSIS — G92 Toxic encephalopathy: Secondary | ICD-10-CM | POA: Diagnosis present

## 2019-10-05 DIAGNOSIS — J449 Chronic obstructive pulmonary disease, unspecified: Secondary | ICD-10-CM | POA: Insufficient documentation

## 2019-10-05 DIAGNOSIS — H9192 Unspecified hearing loss, left ear: Secondary | ICD-10-CM | POA: Insufficient documentation

## 2019-10-05 DIAGNOSIS — E870 Hyperosmolality and hypernatremia: Secondary | ICD-10-CM | POA: Diagnosis present

## 2019-10-05 DIAGNOSIS — Z8249 Family history of ischemic heart disease and other diseases of the circulatory system: Secondary | ICD-10-CM

## 2019-10-05 DIAGNOSIS — Z9049 Acquired absence of other specified parts of digestive tract: Secondary | ICD-10-CM

## 2019-10-05 DIAGNOSIS — J9602 Acute respiratory failure with hypercapnia: Secondary | ICD-10-CM | POA: Diagnosis present

## 2019-10-05 DIAGNOSIS — Z20822 Contact with and (suspected) exposure to covid-19: Secondary | ICD-10-CM | POA: Diagnosis present

## 2019-10-05 DIAGNOSIS — B955 Unspecified streptococcus as the cause of diseases classified elsewhere: Secondary | ICD-10-CM | POA: Diagnosis present

## 2019-10-05 DIAGNOSIS — J9601 Acute respiratory failure with hypoxia: Secondary | ICD-10-CM | POA: Diagnosis present

## 2019-10-05 DIAGNOSIS — E1165 Type 2 diabetes mellitus with hyperglycemia: Secondary | ICD-10-CM | POA: Diagnosis present

## 2019-10-05 DIAGNOSIS — H6982 Other specified disorders of Eustachian tube, left ear: Secondary | ICD-10-CM | POA: Insufficient documentation

## 2019-10-05 DIAGNOSIS — Z7951 Long term (current) use of inhaled steroids: Secondary | ICD-10-CM

## 2019-10-05 DIAGNOSIS — Z79899 Other long term (current) drug therapy: Secondary | ICD-10-CM | POA: Insufficient documentation

## 2019-10-05 DIAGNOSIS — R7881 Bacteremia: Secondary | ICD-10-CM | POA: Diagnosis present

## 2019-10-05 DIAGNOSIS — Z4659 Encounter for fitting and adjustment of other gastrointestinal appliance and device: Secondary | ICD-10-CM

## 2019-10-05 DIAGNOSIS — A419 Sepsis, unspecified organism: Secondary | ICD-10-CM

## 2019-10-05 DIAGNOSIS — Z8661 Personal history of infections of the central nervous system: Secondary | ICD-10-CM

## 2019-10-05 DIAGNOSIS — R6520 Severe sepsis without septic shock: Secondary | ICD-10-CM | POA: Diagnosis present

## 2019-10-05 DIAGNOSIS — G001 Pneumococcal meningitis: Secondary | ICD-10-CM | POA: Diagnosis present

## 2019-10-05 DIAGNOSIS — J3089 Other allergic rhinitis: Secondary | ICD-10-CM

## 2019-10-05 DIAGNOSIS — K5900 Constipation, unspecified: Secondary | ICD-10-CM | POA: Diagnosis present

## 2019-10-05 DIAGNOSIS — G009 Bacterial meningitis, unspecified: Secondary | ICD-10-CM | POA: Diagnosis present

## 2019-10-05 DIAGNOSIS — Z978 Presence of other specified devices: Secondary | ICD-10-CM

## 2019-10-05 DIAGNOSIS — E876 Hypokalemia: Secondary | ICD-10-CM | POA: Diagnosis present

## 2019-10-05 DIAGNOSIS — J440 Chronic obstructive pulmonary disease with acute lower respiratory infection: Secondary | ICD-10-CM | POA: Diagnosis present

## 2019-10-05 DIAGNOSIS — F17210 Nicotine dependence, cigarettes, uncomplicated: Secondary | ICD-10-CM | POA: Diagnosis present

## 2019-10-05 DIAGNOSIS — A403 Sepsis due to Streptococcus pneumoniae: Principal | ICD-10-CM | POA: Diagnosis present

## 2019-10-05 DIAGNOSIS — N179 Acute kidney failure, unspecified: Secondary | ICD-10-CM | POA: Diagnosis present

## 2019-10-05 DIAGNOSIS — I10 Essential (primary) hypertension: Secondary | ICD-10-CM | POA: Diagnosis present

## 2019-10-05 DIAGNOSIS — H7092 Unspecified mastoiditis, left ear: Secondary | ICD-10-CM | POA: Diagnosis present

## 2019-10-05 DIAGNOSIS — J441 Chronic obstructive pulmonary disease with (acute) exacerbation: Secondary | ICD-10-CM | POA: Diagnosis present

## 2019-10-05 DIAGNOSIS — Z833 Family history of diabetes mellitus: Secondary | ICD-10-CM

## 2019-10-05 DIAGNOSIS — I6501 Occlusion and stenosis of right vertebral artery: Secondary | ICD-10-CM | POA: Diagnosis present

## 2019-10-05 DIAGNOSIS — G9341 Metabolic encephalopathy: Secondary | ICD-10-CM | POA: Diagnosis present

## 2019-10-05 DIAGNOSIS — Z720 Tobacco use: Secondary | ICD-10-CM

## 2019-10-05 DIAGNOSIS — H9202 Otalgia, left ear: Secondary | ICD-10-CM | POA: Diagnosis present

## 2019-10-05 DIAGNOSIS — R531 Weakness: Secondary | ICD-10-CM

## 2019-10-05 DIAGNOSIS — K219 Gastro-esophageal reflux disease without esophagitis: Secondary | ICD-10-CM | POA: Diagnosis present

## 2019-10-05 DIAGNOSIS — H6980 Other specified disorders of Eustachian tube, unspecified ear: Secondary | ICD-10-CM | POA: Diagnosis present

## 2019-10-05 LAB — COMPREHENSIVE METABOLIC PANEL
ALT: 21 U/L (ref 0–44)
AST: 21 U/L (ref 15–41)
Albumin: 3.8 g/dL (ref 3.5–5.0)
Alkaline Phosphatase: 80 U/L (ref 38–126)
Anion gap: 14 (ref 5–15)
BUN: 9 mg/dL (ref 6–20)
CO2: 20 mmol/L — ABNORMAL LOW (ref 22–32)
Calcium: 9.3 mg/dL (ref 8.9–10.3)
Chloride: 104 mmol/L (ref 98–111)
Creatinine, Ser: 0.77 mg/dL (ref 0.44–1.00)
GFR calc Af Amer: >60 mL/min (ref >60)
GFR calc non Af Amer: >60 mL/min (ref >60)
Glucose, Bld: 349 mg/dL — ABNORMAL HIGH (ref 70–99)
Potassium: 4.3 mmol/L (ref 3.5–5.1)
Sodium: 138 mmol/L (ref 135–145)
Total Bilirubin: 0.4 mg/dL (ref 0.3–1.2)
Total Protein: 8 g/dL (ref 6.5–8.1)

## 2019-10-05 LAB — CBC
HCT: 41 % (ref 36.0–46.0)
Hemoglobin: 13.5 g/dL (ref 12.0–15.0)
MCH: 27.8 pg (ref 26.0–34.0)
MCHC: 32.9 g/dL (ref 30.0–36.0)
MCV: 84.4 fL (ref 80.0–100.0)
Platelets: 438 10*3/uL — ABNORMAL HIGH (ref 150–400)
RBC: 4.86 MIL/uL (ref 3.87–5.11)
RDW: 15.4 % (ref 11.5–15.5)
WBC: 19 10*3/uL — ABNORMAL HIGH (ref 4.0–10.5)
nRBC: 0 % (ref 0.0–0.2)

## 2019-10-05 LAB — LACTIC ACID, PLASMA: Lactic Acid, Venous: 3.7 mmol/L (ref 0.5–1.9)

## 2019-10-05 LAB — TROPONIN I (HIGH SENSITIVITY): Troponin I (High Sensitivity): 4 ng/L (ref <18)

## 2019-10-05 LAB — GLUCOSE, CAPILLARY: Glucose-Capillary: 339 mg/dL — ABNORMAL HIGH (ref 70–99)

## 2019-10-05 IMAGING — DX DG CHEST 1V PORT
1 series · 1 of 1 positions shown · non-contrast
Comparison: Chest radiograph [DATE]

CLINICAL DATA: Pain

EXAM:
PORTABLE CHEST 1 VIEW

[chest ap]
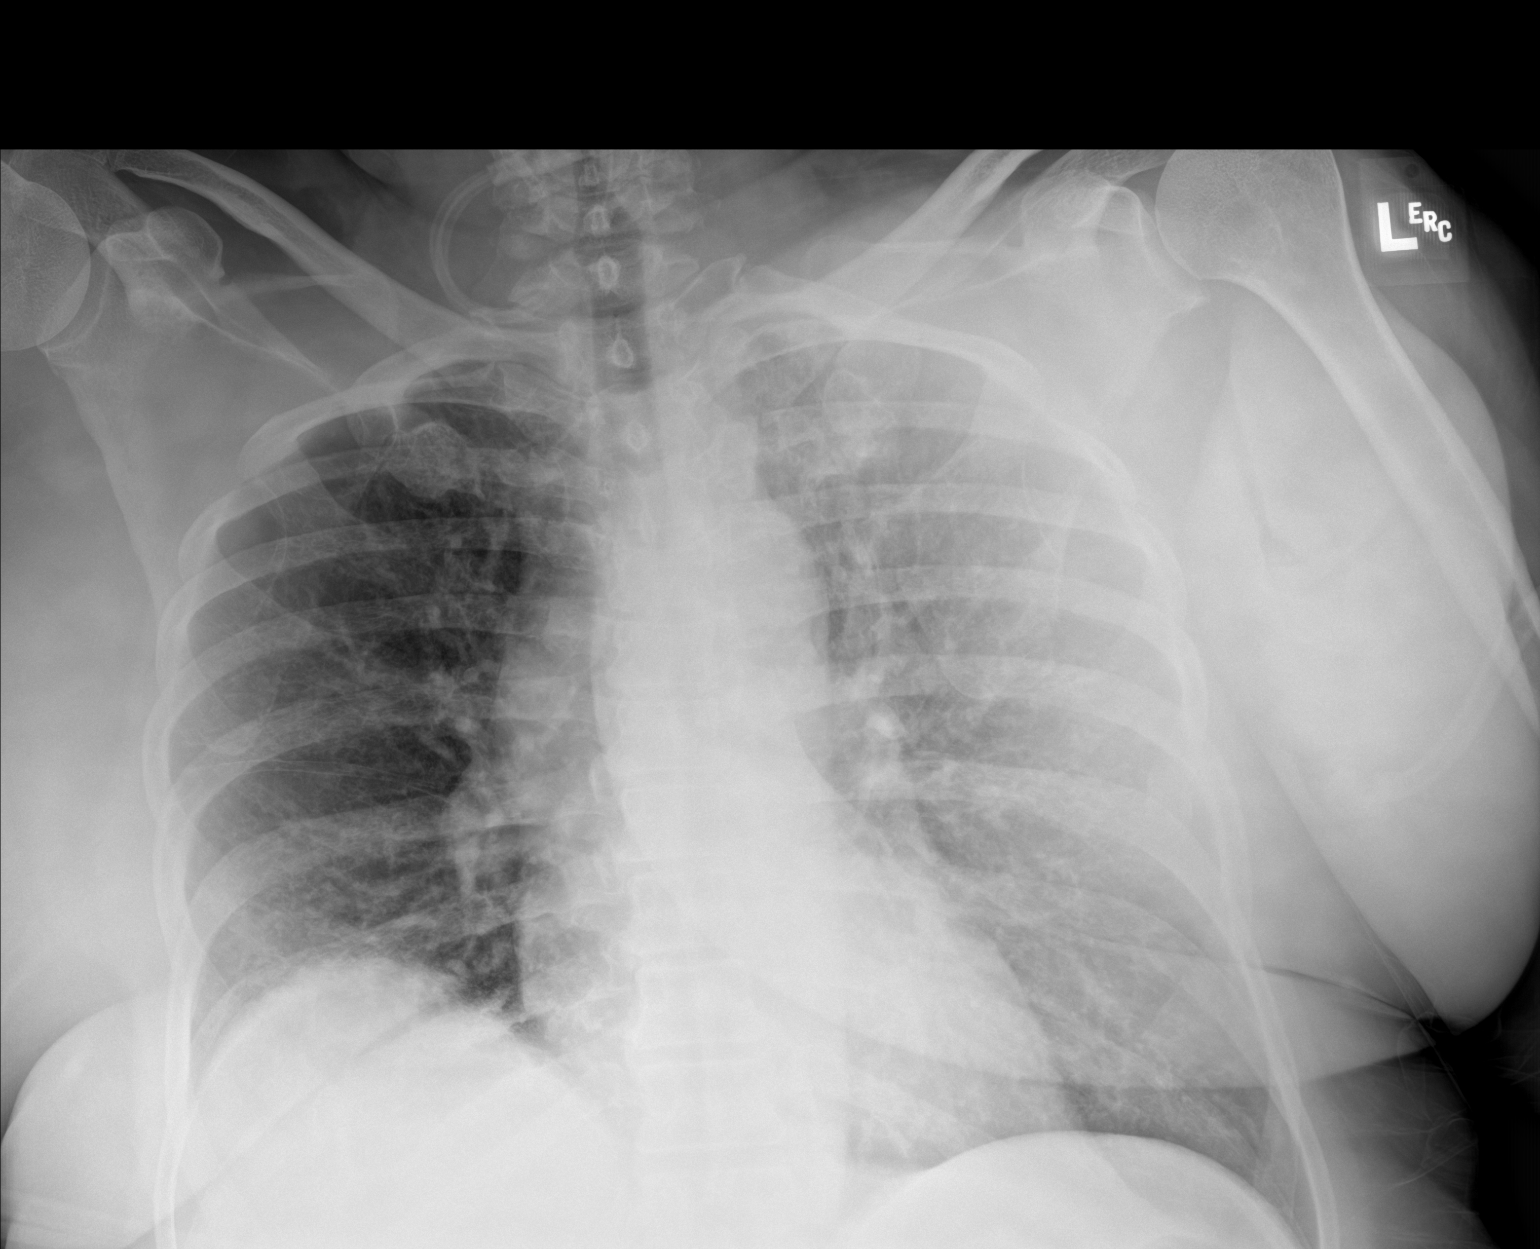

[1 of 1 positions shown; findings below may reference images not displayed]

FINDINGS: Heart size within normal limits. The patient is rotated to the
right. Overlapping soft tissue precludes adequate evaluation of the
left lung. Mild elevation of the right hemidiaphragm. The right lung
is clear. No evidence of pleural effusion or pneumothorax. No acute
bony abnormality.
IMPRESSION: Overlapping soft tissue precludes adequate evaluation of the left
lung.

The right lung is clear.

Mild elevation of the right hemidiaphragm.

## 2019-10-05 IMAGING — CT CT HEAD W/O CM
3 of 4 series · 16 of 47 positions shown, 19 images · non-contrast
Comparison: None.

CLINICAL DATA: Left ear pain for 1 month, decrease D her Ng

EXAM:
CT HEAD WITHOUT CONTRAST
TECHNIQUE: Contiguous axial images were obtained from the base of the skull
through the vertex without intravenous contrast.

[Series 4: head wo · axial · 0.41mm/px · z∈[-251,-116]mm · 10 of 33 slices shown, 13 images]
[im 3/33  brain]
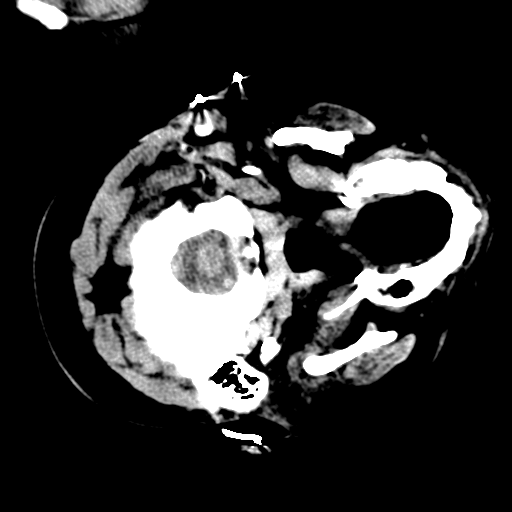
[im 3/33  bone]
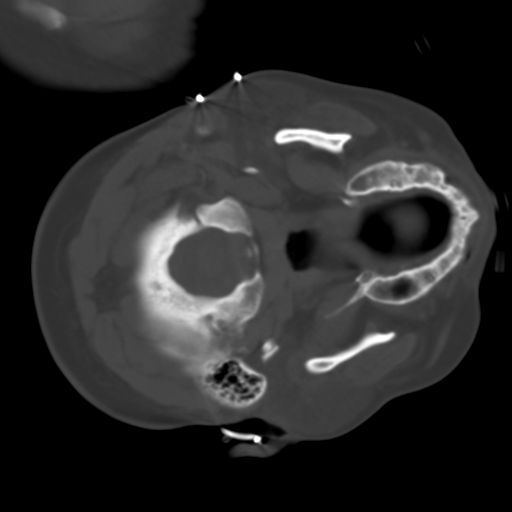
[im 5/33  brain]
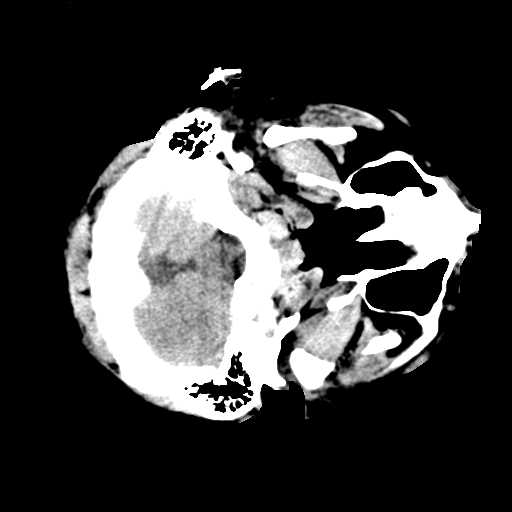
[im 10/33  brain]
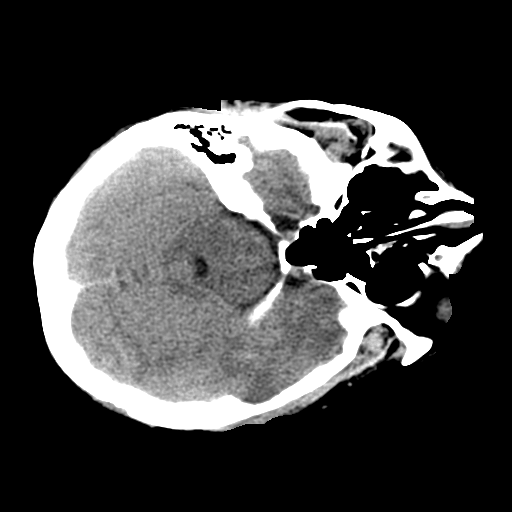
[im 12/33  brain]
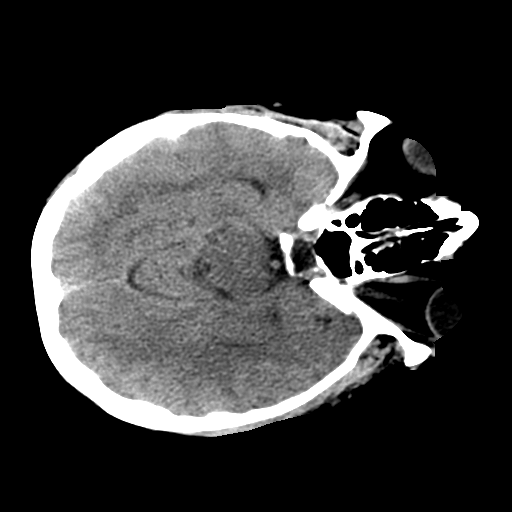
[im 14/33  brain]
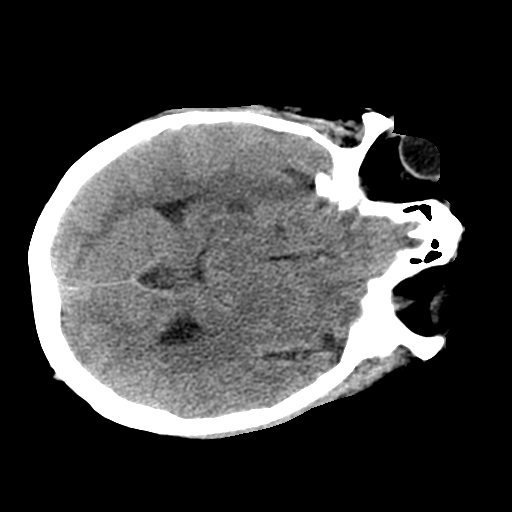
[im 14/33  bone]
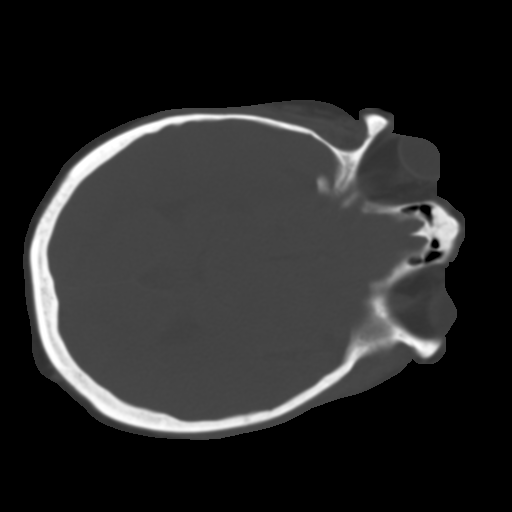
[im 19/33  brain]
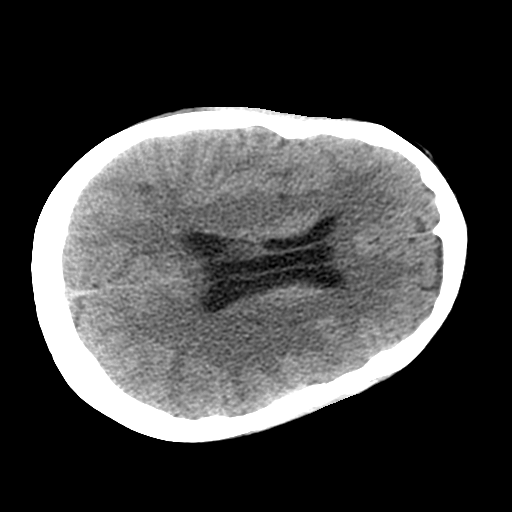
[im 21/33  brain]
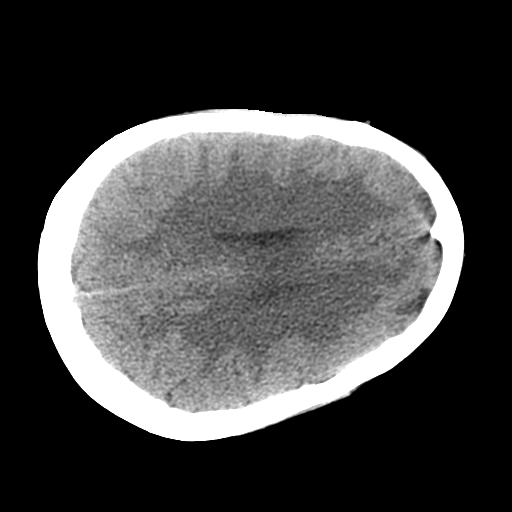
[im 23/33  brain]
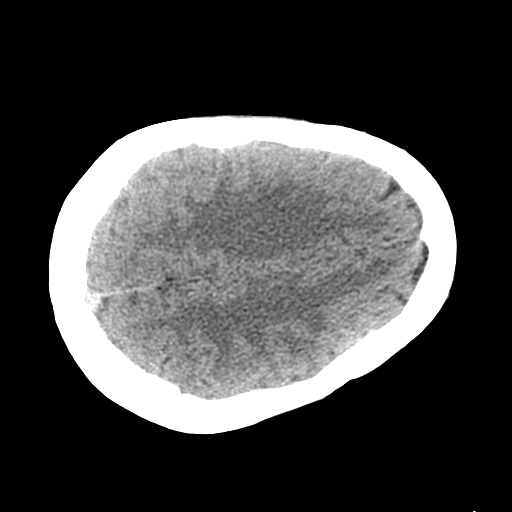
[im 28/33  brain]
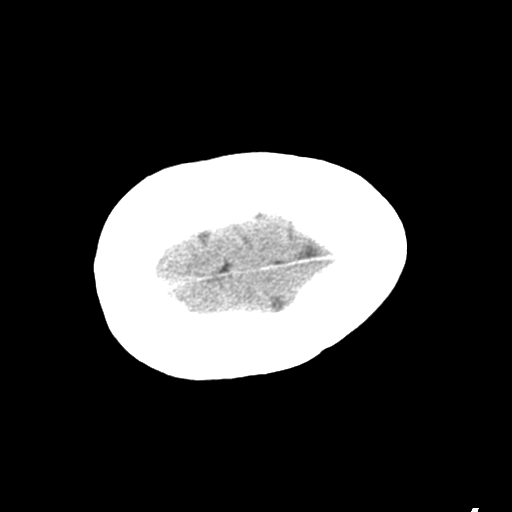
[im 28/33  bone]
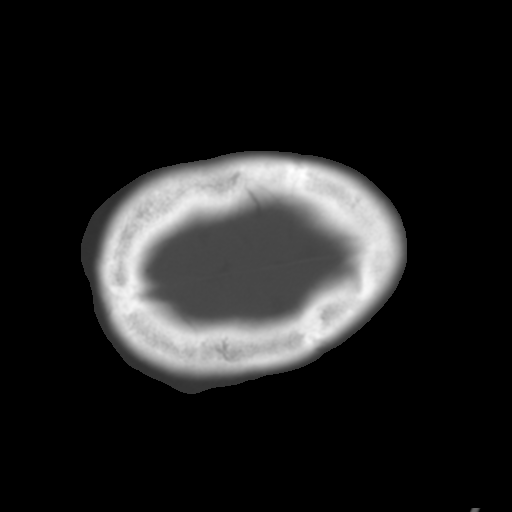
[im 30/33  brain]
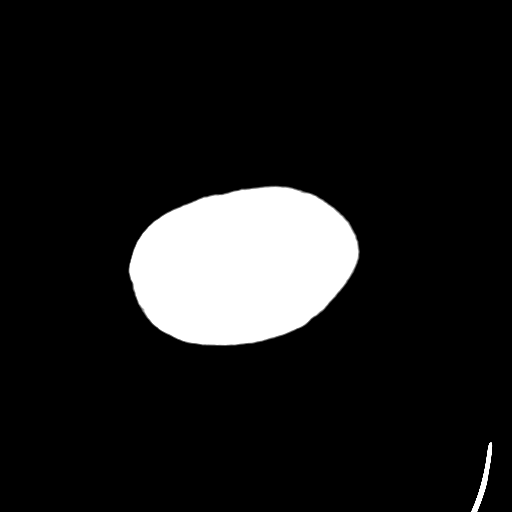

[Series 8: coronal soft tissue · coronal · 0.30mm/px · 3 of 62 slices shown]
[im 21/62  brain]
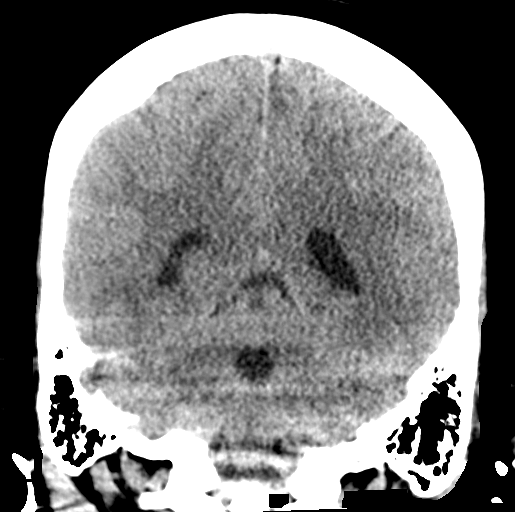
[im 28/62  brain]
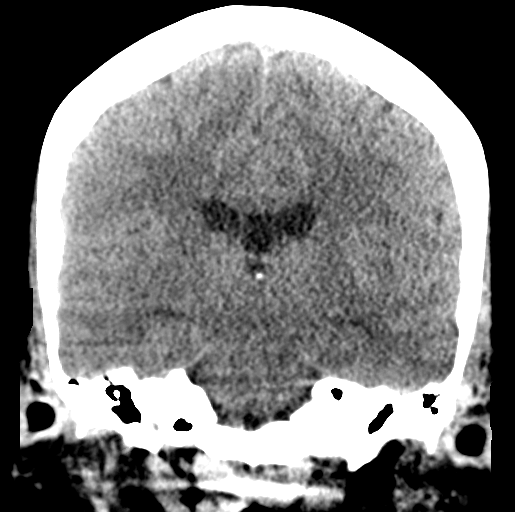
[im 34/62  brain]
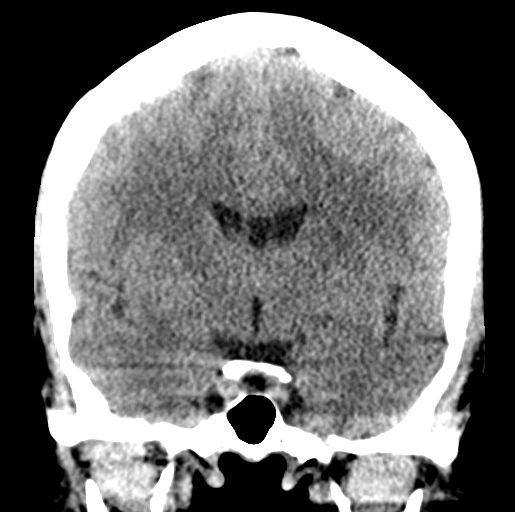

[Series 9: sagittal soft tissue · sagittal · 0.31mm/px · 3 of 46 slices shown]
[im 16/46  brain]
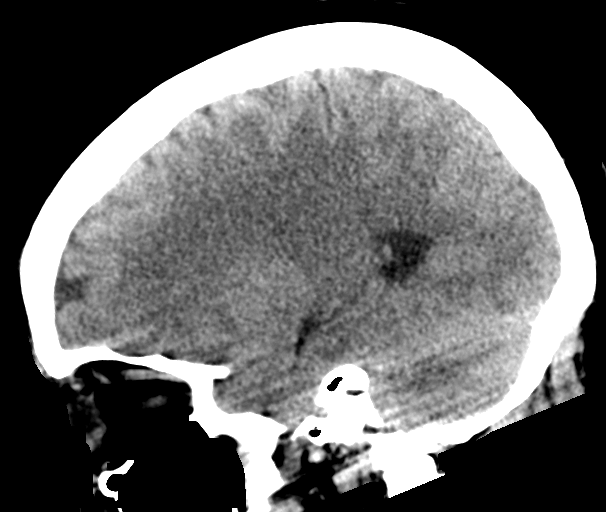
[im 23/46  brain]
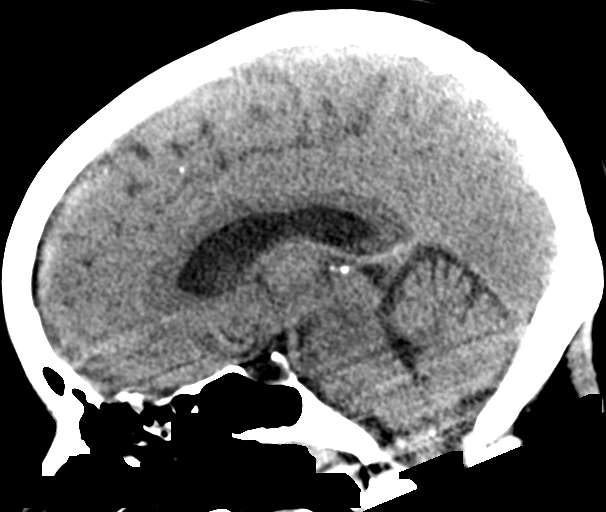
[im 31/46  brain]
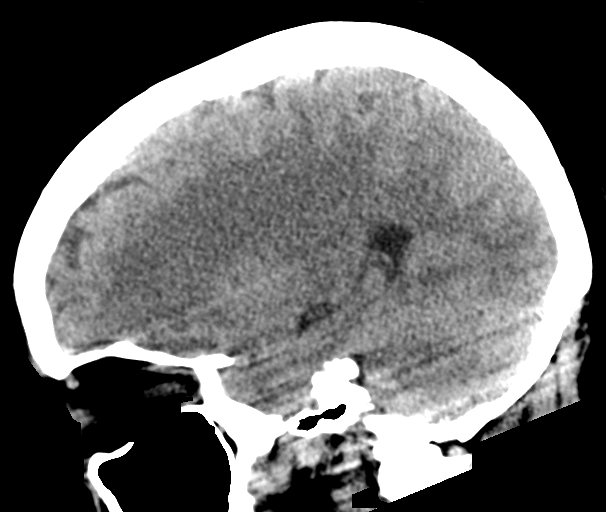

[16 of 47 positions shown; findings below may reference images not displayed]

FINDINGS: Brain: No evidence of acute infarction, hemorrhage, hydrocephalus,
extra-axial collection or mass lesion/mass effect. Cavum septum
pellucidum is incidentally noted.

Vascular: Atherosclerotic calcification of the carotid siphons. No
hyperdense vessel.

Skull: This No calvarial fracture or suspicious osseous lesion. No
scalp swelling or hematoma.

Sinuses/Orbits: Paranasal sinuses and mastoid air cells are
predominantly clear. Middle ear cavities are clear. Ossicular chains
are grossly normally configured on these thick slice images.
External auditory canals are clear. Slightly proptotic appearance of
the globes. Included orbital structures are otherwise unremarkable
without abnormal thickening of the extraocular musculature.

Other: None
IMPRESSION: No acute intracranial findings.

## 2019-10-05 MED ORDER — VANCOMYCIN HCL IN DEXTROSE 1-5 GM/200ML-% IV SOLN
1000.0000 mg | Freq: Once | INTRAVENOUS | Status: AC
  Administered 2019-10-06: 1000 mg via INTRAVENOUS

## 2019-10-05 MED ORDER — METRONIDAZOLE IN NACL 5-0.79 MG/ML-% IV SOLN: 500.0000 mg | Freq: Once | INTRAVENOUS | Status: DC

## 2019-10-05 MED ORDER — SODIUM CHLORIDE 0.9 % IV SOLN
2.0000 g | INTRAVENOUS | Status: AC
  Administered 2019-10-06: 2 g via INTRAVENOUS
  Filled 2019-10-05: qty 2

## 2019-10-05 MED ORDER — LACTATED RINGERS IV BOLUS (SEPSIS)
1000.0000 mL | Freq: Once | INTRAVENOUS | Status: AC
  Administered 2019-10-06: 1000 mL via INTRAVENOUS

## 2019-10-05 MED ORDER — TRAMADOL HCL 50 MG PO TABS: 50.0000 mg | ORAL_TABLET | Freq: Four times a day (QID) | ORAL | 0 refills | Status: DC | PRN

## 2019-10-05 MED ORDER — LACTATED RINGERS IV BOLUS (SEPSIS): 1000.0000 mL | Freq: Once | INTRAVENOUS | Status: DC

## 2019-10-05 MED ORDER — VANCOMYCIN HCL IN DEXTROSE 1-5 GM/200ML-% IV SOLN
1000.0000 mg | Freq: Once | INTRAVENOUS | Status: AC
  Administered 2019-10-06: 1000 mg via INTRAVENOUS
  Filled 2019-10-05: qty 200

## 2019-10-05 MED ORDER — FLUTICASONE PROPIONATE 50 MCG/ACT NA SUSP: 2.0000 | Freq: Every day | NASAL | 0 refills | Status: DC

## 2019-10-05 MED ORDER — SODIUM CHLORIDE 0.9 % IV BOLUS
500.0000 mL | Freq: Once | INTRAVENOUS | Status: AC
  Administered 2019-10-05: 500 mL via INTRAVENOUS

## 2019-10-05 MED ORDER — PREDNISONE 10 MG PO TABS: ORAL_TABLET | ORAL | 0 refills | Status: DC

## 2019-10-05 MED ORDER — OXYCODONE HCL 5 MG PO TABS: 5.0000 mg | ORAL_TABLET | Freq: Four times a day (QID) | ORAL | 0 refills | Status: DC | PRN

## 2019-10-05 NOTE — ED Provider Notes (Signed)
-----------------------------------------   11:33 PM on 10/05/2019 -----------------------------------------  Discussed case with PA Wagner. In summary, this is a 59 year old female who was seen earlier in the ED for earache and placed on prednisone and oxycodone.  Reportedly she became somnolent after taking oxycodone.  Presents with decreased LOC.  CT head and chest xray unremarkable.  Lactic acid 3.7.  Blood cultures and broad-spectrum IV antibiotics initiated.  Hospitalist services was called for admission.   Paulette Blanch, MD 10/06/19 [phone]

## 2019-10-05 NOTE — ED Provider Notes (Signed)
[facility] Emergency Department Provider Note  ____________________________________________   First MD Initiated Contact with Patient 10/05/19 [phone]     (approximate)  I have reviewed the triage vital signs and the nursing notes.   HISTORY  Chief Complaint Otalgia   HPI Cathy Hughes is a 59 y.o. female presents to the ED with complaint of left ear pain for 1 month.  Patient states that she has had decreased hearing in her ear for the month.  She states that also makes noises especially when she is trying to sleep at night.  Patient states that she has tried using allergy medicine without any relief.  She has been afebrile and denies any URI symptoms or Covid exposure.  She rates her pain as a 10/10.     Past Medical History:  Diagnosis Date  . Asthma   . COPD (chronic obstructive pulmonary disease) ([place])   . Hypertension   . Pre-diabetes     Patient Active Problem List   Diagnosis Date Noted  . COPD (chronic obstructive pulmonary disease) ([place]) 07/31/2018  . Class 1 obesity due to excess calories with serious comorbidity and body mass index (BMI) of 34.0 to 34.9 in adult 07/31/2018  . HTN (hypertension) 12/09/2013    Past Surgical History:  Procedure Laterality Date  . CHOLECYSTECTOMY    . COLON SURGERY    . thumb surgery      Prior to Admission medications   Medication Sig Start Date End Date Taking? Authorizing Provider  albuterol (PROVENTIL HFA;VENTOLIN HFA) 108 (90 Base) MCG/ACT inhaler Inhale 2-4 puffs by mouth every 4 hours as needed for wheezing, cough, and/or shortness of breath 07/31/18   [facility]bard Hartshorn, FNP  amLODipine (NORVASC) 5 MG tablet Take 1 tablet (5 mg total) by mouth daily. 05/14/19   Hubbard Hartshorn, FNP  fluticasone (FLONASE) 50 MCG/ACT nasal spray Place 2 sprays into both nostrils daily. 10/05/19 10/04/20  Johnn Hai, PA-C  ipratropium-albuterol (DUONEB) 0.5-2.5 (3) MG/3ML SOLN Take 3 mLs by nebulization every 6  (six) hours as needed. 07/31/18   Hubbard Hartshorn, FNP  montelukast (SINGULAIR) 10 MG tablet Take 1 tablet (10 mg total) by mouth daily. 08/05/18 08/05/19  Tyler Pita, MD  omeprazole (PRILOSEC) 20 MG capsule Take 1 capsule (20 mg total) by mouth 2 (two) times daily. 09/02/18 12/01/18  Tyler Pita, MD  oxyCODONE (OXY IR/ROXICODONE) 5 MG immediate release tablet Take 1 tablet (5 mg total) by mouth every 6 (six) hours as needed for severe pain. 10/05/19   Johnn Hai, PA-C  predniSONE (DELTASONE) 10 MG tablet Take 6 tablets  today, on day 2 take 5 tablets, day 3 take 4 tablets, day 4 take 3 tablets, day 5 take  2 tablets and 1 tablet the last day 10/05/19   Johnn Hai, PA-C  tiZANidine (ZANAFLEX) 4 MG tablet Take 0.5-1 tablets (2-4 mg total) by mouth every 6 (six) hours as needed for muscle spasms. 08/19/18   Hubbard Hartshorn, FNP  TRELEGY ELLIPTA 100-62.5-25 MCG/INH AEPB INHALE 1 PUFF INTO THE LUNGS DAILY 12/13/18   Hubbard Hartshorn, FNP    Allergies Aspirin, Tramadol, and Tylenol [acetaminophen]  Family History  Problem Relation Age of Onset  . Diabetes Mother   . Hypertension Mother   . Diabetes Father   . Hypertension Father     Social History Social History   Tobacco Use  . Smoking status: Current Some Day Smoker    Packs/day:  0.50    Years: 35.00    Pack years: 17.50    Types: Cigarettes  . Smokeless tobacco: Never Used  Substance Use Topics  . Alcohol use: Yes    Comment: occ  . Drug use: No    Review of Systems Constitutional: No fever/chills Eyes: No visual changes. ENT: No sore throat.  Decreased hearing left ear. Cardiovascular: Denies chest pain. Respiratory: Denies shortness of breath.  Negative for cough. Gastrointestinal:   No nausea, no vomiting. Musculoskeletal: Negative for back pain. Skin: Negative for rash. Neurological: Negative for headaches, focal weakness or numbness. ____________________________________________   PHYSICAL  EXAM:  VITAL SIGNS: ED Triage Vitals  Enc Vitals Group     BP 10/05/19 0853 (!) 152/86     Pulse Rate 10/05/19 0853 97     Resp 10/05/19 0853 18     Temp 10/05/19 0853 (!) 97.5 F (36.4 C)     Temp Source 10/05/19 0853 Oral     SpO2 10/05/19 0853 95 %     Weight 10/05/19 0850 200 lb (90.7 kg)     Height 10/05/19 0850 5\' 5"  (1.651 m)     Head Circumference --      Peak Flow --      Pain Score 10/05/19 0850 10     Pain Loc --      Pain Edu? --      Excl. in [place]? --    Constitutional: Alert and oriented. Well appearing and in no acute distress. Eyes: Conjunctivae are normal.  Head: Atraumatic. Nose: No congestion/rhinnorhea.  On examination of the right EAC is clear and TM is dull.  Left EAC is clear and TM is dull with poor light reflex.  There is some minimal injection but no erythema is present. Neck: No stridor.   Cardiovascular: Normal rate, regular rhythm. Grossly normal heart sounds.  Good peripheral circulation. Respiratory: Normal respiratory effort.  No retractions. Lungs CTAB. Musculoskeletal: Moves upper and lower extremities without difficulty.  Normal gait as noted. Neurologic:  Normal speech and language. No gross focal neurologic deficits are appreciated. No gait instability. Skin:  Skin is warm, dry and intact. No rash noted. Psychiatric: Mood and affect are normal. Speech and behavior are normal.  ____________________________________________   LABS (all labs ordered are listed, but only abnormal results are displayed)  Labs Reviewed - No data to display  PROCEDURES  Procedure(s) performed (including Critical Care):  Procedures  ____________________________________________   INITIAL IMPRESSION / ASSESSMENT AND PLAN / ED COURSE  As part of my medical decision making, I reviewed the following data within the electronic MEDICAL RECORD NUMBER Notes from prior ED visits and [place] Controlled Substance Database  59 year old female presents to the ED with complaint  of left ear pain for 1 month.  Patient states that there is decreased hearing and also pain is increased when she is trying to sleep at night.  She has tried some over-the-counter medication without any results.  She denies any fever or chills.  On exam there is no erythema but the outer rim of the left TM is minimally injected with poor light reflex and fluid present.  Consistent with an acute eustachian tube dysfunction.  Patient agrees to try the prednisone and Flonase.  If this is not working and she does not get any relief she knows that she will need to follow-up with [facility].  She also requested pain medication as this is caused a headache.  Patient because of allergies was given oxycodone  5 mg 1 every 6 hours #10.  ____________________________________________   FINAL CLINICAL IMPRESSION(S) / ED DIAGNOSES  Final diagnoses:  Acute dysfunction of left eustachian tube     ED Discharge Orders         Ordered    predniSONE (DELTASONE) 10 MG tablet  Status:  Discontinued     10/05/19 0938    fluticasone (FLONASE) 50 MCG/ACT nasal spray  Daily     10/05/19 0938    traMADol (ULTRAM) 50 MG tablet  Every 6 hours PRN,   Status:  Discontinued     10/05/19 0938    oxyCODONE (OXY IR/ROXICODONE) 5 MG immediate release tablet  Every 6 hours PRN     10/05/19 0950    predniSONE (DELTASONE) 10 MG tablet     10/05/19 1054           Note:  This document was prepared using Dragon voice recognition software and may include unintentional dictation errors.    Johnn Hai, PA-C 10/05/19 1151    Lavonia Drafts, MD 10/05/19 1218

## 2019-10-05 NOTE — ED Triage Notes (Signed)
Patient states that she was seen here earlier today for an ear infection. Patient states that the medication she was given caused her to have a headache. Patient states that she took oxycodone.

## 2019-10-05 NOTE — ED Notes (Signed)
See triage note  Presents with left ear pain for about 1 month  States she feels like ear is clogged up  And it is causing pain to left side of face  No fever  States she has tried to wash out her ears and she uses allergy meds w/o relief  Afebrile on arrival

## 2019-10-05 NOTE — ED Triage Notes (Signed)
Pt c/o left ear pain "feels like its stopped up, I cant hear anything".

## 2019-10-05 NOTE — ED Provider Notes (Addendum)
[facility] Emergency Department Provider Note  ____________________________________________  Time seen: Approximately 10:49 PM  I have reviewed the triage vital signs and the nursing notes.   HISTORY  Chief Complaint Headache    HPI Cathy Hughes is a 59 y.o. female that presents to the emergency department for evaluation of headache, dizziness, shortness of breath, abdominal pain today.  Patient states that her headache wraps around her entire head and is pounding.  Patient was seen in this emergency department earlier today for left ear pain.  She was given a prescription for oxycodone for pain and prednisone for eustachian tube dysfunction.  Patient states that headache started immediately following medications.  She is diabetic.    Past Medical History:  Diagnosis Date  . Asthma   . COPD (chronic obstructive pulmonary disease) ([place])   . Hypertension   . Pre-diabetes     Patient Active Problem List   Diagnosis Date Noted  . COPD (chronic obstructive pulmonary disease) ([place]) 07/31/2018  . Class 1 obesity due to excess calories with serious comorbidity and body mass index (BMI) of 34.0 to 34.9 in adult 07/31/2018  . HTN (hypertension) 12/09/2013    Past Surgical History:  Procedure Laterality Date  . CHOLECYSTECTOMY    . COLON SURGERY    . thumb surgery      Prior to Admission medications   Medication Sig Start Date End Date Taking? Authorizing Provider  albuterol (PROVENTIL HFA;VENTOLIN HFA) 108 (90 Base) MCG/ACT inhaler Inhale 2-4 puffs by mouth every 4 hours as needed for wheezing, cough, and/or shortness of breath 07/31/18   Hubbard Hartshorn, FNP  amLODipine (NORVASC) 5 MG tablet Take 1 tablet (5 mg total) by mouth daily. 05/14/19   Hubbard Hartshorn, FNP  fluticasone (FLONASE) 50 MCG/ACT nasal spray Place 2 sprays into both nostrils daily. 10/05/19 10/04/20  Johnn Hai, PA-C  ipratropium-albuterol (DUONEB) 0.5-2.5 (3) MG/3ML SOLN Take 3 mLs  by nebulization every 6 (six) hours as needed. 07/31/18   Hubbard Hartshorn, FNP  montelukast (SINGULAIR) 10 MG tablet Take 1 tablet (10 mg total) by mouth daily. 08/05/18 08/05/19  Tyler Pita, MD  omeprazole (PRILOSEC) 20 MG capsule Take 1 capsule (20 mg total) by mouth 2 (two) times daily. 09/02/18 12/01/18  Tyler Pita, MD  oxyCODONE (OXY IR/ROXICODONE) 5 MG immediate release tablet Take 1 tablet (5 mg total) by mouth every 6 (six) hours as needed for severe pain. 10/05/19   Johnn Hai, PA-C  predniSONE (DELTASONE) 10 MG tablet Take 6 tablets  today, on day 2 take 5 tablets, day 3 take 4 tablets, day 4 take 3 tablets, day 5 take  2 tablets and 1 tablet the last day 10/05/19   Johnn Hai, PA-C  tiZANidine (ZANAFLEX) 4 MG tablet Take 0.5-1 tablets (2-4 mg total) by mouth every 6 (six) hours as needed for muscle spasms. 08/19/18   Hubbard Hartshorn, FNP  TRELEGY ELLIPTA 100-62.5-25 MCG/INH AEPB INHALE 1 PUFF INTO THE LUNGS DAILY 12/13/18   Hubbard Hartshorn, FNP    Allergies Aspirin, Tramadol, and Tylenol [acetaminophen]  Family History  Problem Relation Age of Onset  . Diabetes Mother   . Hypertension Mother   . Diabetes Father   . Hypertension Father     Social History Social History   Tobacco Use  . Smoking status: Current Some Day Smoker    Packs/day: 0.50    Years: 35.00    Pack years: 17.50  Types: Cigarettes  . Smokeless tobacco: Never Used  Substance Use Topics  . Alcohol use: Yes    Comment: occ  . Drug use: No     Review of Systems  Cardiovascular: No chest pain. Respiratory: Positive shortness of breath. Gastrointestinal: Positive for abdominal pain.  No nausea, no vomiting.  Musculoskeletal: Negative for musculoskeletal pain. Skin: Negative for rash, abrasions, lacerations, ecchymosis. Neurological: Positive for headache.   ____________________________________________   PHYSICAL EXAM:  VITAL SIGNS: ED Triage Vitals  Enc Vitals Group      BP 10/05/19 2036 (!) 168/79     Pulse Rate 10/05/19 2036 89     Resp 10/05/19 2036 20     Temp 10/05/19 2036 97.7 F (36.5 C)     Temp Source 10/05/19 2036 Oral     SpO2 10/05/19 2036 96 %     Weight --      Height 10/05/19 2037 5\' 5"  (1.651 m)     Head Circumference --      Peak Flow --      Pain Score 10/05/19 2037 10     Pain Loc --      Pain Edu? --      Excl. in [place]? --      Constitutional: Oriented.  Falling asleep during exam.  Responds to sternal rub. Eyes: Conjunctivae are normal. PERRL. EOMI. No pinpoint pupils. Head: Atraumatic. ENT:      Ears:      Nose: No congestion/rhinnorhea.      Mouth/Throat: Mucous membranes are moist.  Neck: No stridor.  Cardiovascular: Normal rate, regular rhythm.  Good peripheral circulation. Respiratory: Labored, deep breathing. Gastrointestinal: Soft and nontender to palpation.  Musculoskeletal: Full range of motion to all extremities. No gross deformities appreciated. Neurologic:  Normal speech and language. No gross focal neurologic deficits are appreciated.  Skin:  Skin is warm, dry and intact. No rash noted.   ____________________________________________   LABS (all labs ordered are listed, but only abnormal results are displayed)  Labs Reviewed  CBC - Abnormal; Notable for the following components:      Result Value   WBC 19.0 (*)    Platelets 438 (*)    All other components within normal limits  COMPREHENSIVE METABOLIC PANEL - Abnormal; Notable for the following components:   CO2 20 (*)    Glucose, Bld 349 (*)    All other components within normal limits  LACTIC ACID, PLASMA - Abnormal; Notable for the following components:   Lactic Acid, Venous 3.7 (*)    All other components within normal limits  GLUCOSE, CAPILLARY - Abnormal; Notable for the following components:   Glucose-Capillary 339 (*)    All other components within normal limits  CULTURE, BLOOD (ROUTINE X 2)  CULTURE, BLOOD (ROUTINE X 2)  SARS CORONAVIRUS  2 (TAT 6-24 HRS)  URINALYSIS, COMPLETE (UACMP) WITH MICROSCOPIC  LACTIC ACID, PLASMA  CBG MONITORING, ED  TROPONIN I (HIGH SENSITIVITY)  TROPONIN I (HIGH SENSITIVITY)   ____________________________________________  EKG   ____________________________________________  RADIOLOGY  CT Head Wo Contrast  Result Date: 10/05/2019 CLINICAL DATA:  Left ear pain for 1 month, decrease D her Ng EXAM: CT HEAD WITHOUT CONTRAST TECHNIQUE: Contiguous axial images were obtained from the base of the skull through the vertex without intravenous contrast. COMPARISON:  None. FINDINGS: Brain: No evidence of acute infarction, hemorrhage, hydrocephalus, extra-axial collection or mass lesion/mass effect. Cavum septum pellucidum is incidentally noted. Vascular: Atherosclerotic calcification of the carotid siphons. No hyperdense vessel. Skull: This  No calvarial fracture or suspicious osseous lesion. No scalp swelling or hematoma. Sinuses/Orbits: Paranasal sinuses and mastoid air cells are predominantly clear. Middle ear cavities are clear. Ossicular chains are grossly normally configured on these thick slice images. External auditory canals are clear. Slightly proptotic appearance of the globes. Included orbital structures are otherwise unremarkable without abnormal thickening of the extraocular musculature. Other: None IMPRESSION: No acute intracranial findings. Electronically Signed   By: Lovena Le M.D.   On: 10/05/2019 23:24   DG Chest Port 1 View  Result Date: 10/05/2019 CLINICAL DATA:  Pain EXAM: PORTABLE CHEST 1 VIEW COMPARISON:  Chest radiograph 06/04/2018 FINDINGS: Heart size within normal limits. The patient is rotated to the right. Overlapping soft tissue precludes adequate evaluation of the left lung. Mild elevation of the right hemidiaphragm. The right lung is clear. No evidence of pleural effusion or pneumothorax. No acute bony abnormality. IMPRESSION: Overlapping soft tissue precludes adequate evaluation  of the left lung. The right lung is clear. Mild elevation of the right hemidiaphragm. Electronically Signed   By: Kellie Simmering DO   On: 10/05/2019 23:07    ____________________________________________    PROCEDURES  Procedure(s) performed:    Procedures    Medications  lactated ringers bolus 1,000 mL (has no administration in time range)    And  lactated ringers bolus 1,000 mL (has no administration in time range)    And  lactated ringers bolus 1,000 mL (has no administration in time range)  ceFEPIme (MAXIPIME) 2 g in sodium chloride 0.9 % 100 mL IVPB (has no administration in time range)  metroNIDAZOLE (FLAGYL) IVPB 500 mg (has no administration in time range)  vancomycin (VANCOCIN) IVPB 1000 mg/200 mL premix (has no administration in time range)  vancomycin (VANCOCIN) IVPB 1000 mg/200 mL premix (has no administration in time range)  sodium chloride 0.9 % bolus 500 mL (500 mLs Intravenous New Bag/Given 10/05/19 2243)     ____________________________________________   INITIAL IMPRESSION / ASSESSMENT AND PLAN / ED COURSE  Pertinent labs & imaging results that were available during my care of the patient were reviewed by me and considered in my medical decision making (see chart for details).  Review of the [place] CSRS was performed in accordance of the [place] prior to dispensing any controlled drugs.  Differential diagnosis includes, but is not limited to, alcohol, illicit or prescription medications, or other toxic ingestion; intracranial pathology such as stroke or intracerebral hemorrhage; fever or infectious causes including sepsis; hypoxemia and/or hypercarbia; uremia; trauma; endocrine related disorders such as diabetes, hypoglycemia, DKA and thyroid-related diseases; hypertensive encephalopathy; etc.   Patient presented to the emergency department for evaluation of headache, shortness of breath, abdominal pain today following ER visit earlier today.  Patient is falling  asleep during exam and questions.  She does respond to sternal rub but falls asleep again.  Patient has deep, labored breathing.  Patient is oriented to place person and time but does seem to be altered.  Patient states that she only took 1 oxycodone 9 hours ago.  Patient does have a history of diabetes and was started on prednisone today.  POC blood sugar is pending.  Lab work including CBC, CMP, lactic acid, troponin was ordered.  Head CT and chest x-ray were ordered.  Head CT and chest CT are negative for acute abnormalities.  CBC remarkable for leukocytosis of 19.0.  Lactic acid elevated at 3.7.  CMP remarkable for CO2 20 and glucose 349. Patient will be admitted for sepsis of unknown  origin. Cefepime, flagyl, vancomycin, and fluids were given. Report was given to Dr. Damita Dunnings for admission.   Cathy Hughes was evaluated in Emergency Department on 10/05/2019 for the symptoms described in the history of present illness. She was evaluated in the context of the global COVID-19 pandemic, which necessitated consideration that the patient might be at risk for infection with the SARS-CoV-2 virus that causes COVID-19. Institutional protocols and algorithms that pertain to the evaluation of patients at risk for COVID-19 are in a state of rapid change based on information released by regulatory bodies including the CDC and federal and state organizations. These policies and algorithms were followed during the patient's care in the ED.  ____________________________________________  FINAL CLINICAL IMPRESSION(S) / ED DIAGNOSES  Final diagnoses:  Sepsis, due to unspecified organism, unspecified whether acute organ dysfunction present ([place])  Altered mental status, unspecified altered mental status type      NEW MEDICATIONS STARTED DURING THIS VISIT:  ED Discharge Orders    None          This chart was dictated using voice recognition software/Dragon. Despite best efforts to proofread, errors can occur  which can change the meaning. Any change was purely unintentional.    Laban Emperor, PA-C 10/05/19 2256    Earleen Newport, MD 10/05/19 2302    Laban Emperor, PA-C 10/05/19 2351    Earleen Newport, MD 10/06/19 [phone]

## 2019-10-05 NOTE — Progress Notes (Signed)
PHARMACY -  BRIEF ANTIBIOTIC NOTE   Pharmacy has received consult(s) for Cefepime and Vancomycin from an ED provider.  The patient's profile has been reviewed for ht/wt/allergies/indication/available labs.    One time order(s) placed for Vancomycin 2gm (1gm + 1gm) and Cefepime 2gm  Further antibiotics/pharmacy consults should be ordered by admitting physician if indicated.                       Thank you, Hart Robinsons A 10/05/2019  11:42 PM

## 2019-10-05 NOTE — Discharge Instructions (Signed)
Keep your appointment with your primary care provider to follow-up for eustachian tube dysfunction.  If this is not improving they will make a referral to a ear nose and throat doctor for continued care.  The prednisone begin today taking 6 tablets and decreasing by 1 tablet each day.  The Flonase nasal spray also has a steroid in it and will help reduce the swelling in your sinus area and hopefully the ear canal to decrease fluid.  You may take tramadol as needed for headache and pain.  This medication could cause drowsiness and increase your risk for injury.  Do not take this medication and drive or operate machinery.

## 2019-10-05 NOTE — Progress Notes (Signed)
CODE SEPSIS - PHARMACY COMMUNICATION  **Broad Spectrum Antibiotics should be administered within 1 hour of Sepsis diagnosis**  Time Code Sepsis Called/Page Received: 2336  Antibiotics Ordered: Cefepime, Flagyl, Vancomycin  Time of 1st antibiotic administration: 0124  Additional action taken by pharmacy: contacted nursing, Pt yelling, please help me- will not lay flat or describe what is wrong. Pt requiring sedation  If necessary, Name of Provider/Nurse Contacted:   Ena Dawley ,PharmD Clinical Pharmacist  10/05/2019  11:39 PM

## 2019-10-06 ENCOUNTER — Inpatient Hospital Stay: Payer: Self-pay

## 2019-10-06 DIAGNOSIS — Z886 Allergy status to analgesic agent status: Secondary | ICD-10-CM

## 2019-10-06 DIAGNOSIS — J449 Chronic obstructive pulmonary disease, unspecified: Secondary | ICD-10-CM

## 2019-10-06 DIAGNOSIS — I10 Essential (primary) hypertension: Secondary | ICD-10-CM

## 2019-10-06 DIAGNOSIS — Z4659 Encounter for fitting and adjustment of other gastrointestinal appliance and device: Secondary | ICD-10-CM

## 2019-10-06 DIAGNOSIS — J9601 Acute respiratory failure with hypoxia: Secondary | ICD-10-CM

## 2019-10-06 DIAGNOSIS — A419 Sepsis, unspecified organism: Secondary | ICD-10-CM

## 2019-10-06 DIAGNOSIS — G039 Meningitis, unspecified: Secondary | ICD-10-CM

## 2019-10-06 DIAGNOSIS — G9340 Encephalopathy, unspecified: Secondary | ICD-10-CM

## 2019-10-06 DIAGNOSIS — J3089 Other allergic rhinitis: Secondary | ICD-10-CM

## 2019-10-06 DIAGNOSIS — G9341 Metabolic encephalopathy: Secondary | ICD-10-CM

## 2019-10-06 DIAGNOSIS — R6520 Severe sepsis without septic shock: Secondary | ICD-10-CM

## 2019-10-06 DIAGNOSIS — Z885 Allergy status to narcotic agent status: Secondary | ICD-10-CM

## 2019-10-06 DIAGNOSIS — Z9911 Dependence on respirator [ventilator] status: Secondary | ICD-10-CM

## 2019-10-06 DIAGNOSIS — A403 Sepsis due to Streptococcus pneumoniae: Secondary | ICD-10-CM

## 2019-10-06 DIAGNOSIS — Z794 Long term (current) use of insulin: Secondary | ICD-10-CM

## 2019-10-06 DIAGNOSIS — E1165 Type 2 diabetes mellitus with hyperglycemia: Secondary | ICD-10-CM

## 2019-10-06 DIAGNOSIS — Z888 Allergy status to other drugs, medicaments and biological substances status: Secondary | ICD-10-CM

## 2019-10-06 DIAGNOSIS — Z978 Presence of other specified devices: Secondary | ICD-10-CM

## 2019-10-06 DIAGNOSIS — B953 Streptococcus pneumoniae as the cause of diseases classified elsewhere: Secondary | ICD-10-CM

## 2019-10-06 DIAGNOSIS — R7881 Bacteremia: Secondary | ICD-10-CM

## 2019-10-06 DIAGNOSIS — Z720 Tobacco use: Secondary | ICD-10-CM

## 2019-10-06 DIAGNOSIS — R4182 Altered mental status, unspecified: Secondary | ICD-10-CM | POA: Diagnosis present

## 2019-10-06 LAB — BLOOD GAS, ARTERIAL
Acid-base deficit: 2.1 mmol/L — ABNORMAL HIGH (ref 0.0–2.0)
Acid-base deficit: 2.5 mmol/L — ABNORMAL HIGH (ref 0.0–2.0)
Bicarbonate: 19.8 mmol/L — ABNORMAL LOW (ref 20.0–28.0)
Bicarbonate: 24.2 mmol/L (ref 20.0–28.0)
FIO2: 28
FIO2: 0.5
MECHVT: 450 mL
O2 Saturation: 89.9 %
O2 Saturation: 96.1 %
PEEP: 5 cmH2O
Patient temperature: 37
Patient temperature: 37
RATE: 18 resp/min
pCO2 arterial: 26 mmHg — ABNORMAL LOW (ref 32.0–48.0)
pCO2 arterial: 48 mmHg (ref 32.0–48.0)
pH, Arterial: 7.49 — ABNORMAL HIGH (ref 7.350–7.450)
pH, Arterial: 7.31 — ABNORMAL LOW (ref 7.350–7.450)
pO2, Arterial: 53 mmHg — ABNORMAL LOW (ref 83.0–108.0)
pO2, Arterial: 90 mmHg (ref 83.0–108.0)

## 2019-10-06 LAB — TSH: TSH: 1.274 u[IU]/mL (ref 0.350–4.500)

## 2019-10-06 LAB — TRIGLYCERIDES: Triglycerides: 60 mg/dL (ref <150)

## 2019-10-06 LAB — BLOOD CULTURE ID PANEL (REFLEXED)

## 2019-10-06 LAB — COMPREHENSIVE METABOLIC PANEL
ALT: 20 U/L (ref 0–44)
AST: 19 U/L (ref 15–41)
Albumin: 3.6 g/dL (ref 3.5–5.0)
Alkaline Phosphatase: 80 U/L (ref 38–126)
Anion gap: 12 (ref 5–15)
BUN: 8 mg/dL (ref 6–20)
CO2: 21 mmol/L — ABNORMAL LOW (ref 22–32)
Calcium: 9 mg/dL (ref 8.9–10.3)
Chloride: 106 mmol/L (ref 98–111)
Creatinine, Ser: 0.69 mg/dL (ref 0.44–1.00)
GFR calc Af Amer: >60 mL/min (ref >60)
GFR calc non Af Amer: >60 mL/min (ref >60)
Glucose, Bld: 352 mg/dL — ABNORMAL HIGH (ref 70–99)
Potassium: 3.4 mmol/L — ABNORMAL LOW (ref 3.5–5.1)
Sodium: 139 mmol/L (ref 135–145)
Total Bilirubin: 0.6 mg/dL (ref 0.3–1.2)
Total Protein: 7.7 g/dL (ref 6.5–8.1)

## 2019-10-06 LAB — URINALYSIS, COMPLETE (UACMP) WITH MICROSCOPIC
Bacteria, UA: NONE SEEN
Bilirubin Urine: NEGATIVE
Glucose, UA: >=500 mg/dL — AB
Hgb urine dipstick: NEGATIVE
Ketones, ur: 5 mg/dL — AB
Leukocytes,Ua: NEGATIVE
Nitrite: NEGATIVE
Protein, ur: 30 mg/dL — AB
Specific Gravity, Urine: 1.013 (ref 1.005–1.030)
pH: 6 (ref 5.0–8.0)

## 2019-10-06 LAB — HEMOGLOBIN A1C
Hgb A1c MFr Bld: 8.2 % — ABNORMAL HIGH (ref 4.8–5.6)
Mean Plasma Glucose: 188.64 mg/dL

## 2019-10-06 LAB — URINE DRUG SCREEN, QUALITATIVE (ARMC ONLY)
Amphetamines, Ur Screen: NOT DETECTED
Barbiturates, Ur Screen: NOT DETECTED
Benzodiazepine, Ur Scrn: NOT DETECTED
Cannabinoid 50 Ng, Ur ~~LOC~~: NOT DETECTED
Cocaine Metabolite,Ur ~~LOC~~: NOT DETECTED
MDMA (Ecstasy)Ur Screen: NOT DETECTED
Methadone Scn, Ur: NOT DETECTED
Opiate, Ur Screen: NOT DETECTED
Phencyclidine (PCP) Ur S: NOT DETECTED
Tricyclic, Ur Screen: NOT DETECTED

## 2019-10-06 LAB — RESPIRATORY PANEL BY PCR

## 2019-10-06 LAB — POC SARS CORONAVIRUS 2 AG: SARS Coronavirus 2 Ag: NEGATIVE

## 2019-10-06 LAB — MRSA PCR SCREENING: MRSA by PCR: NEGATIVE

## 2019-10-06 LAB — GLUCOSE, CAPILLARY
Glucose-Capillary: 184 mg/dL — ABNORMAL HIGH (ref 70–99)
Glucose-Capillary: 226 mg/dL — ABNORMAL HIGH (ref 70–99)
Glucose-Capillary: 248 mg/dL — ABNORMAL HIGH (ref 70–99)
Glucose-Capillary: 255 mg/dL — ABNORMAL HIGH (ref 70–99)
Glucose-Capillary: 290 mg/dL — ABNORMAL HIGH (ref 70–99)
Glucose-Capillary: 321 mg/dL — ABNORMAL HIGH (ref 70–99)
Glucose-Capillary: 343 mg/dL — ABNORMAL HIGH (ref 70–99)
Glucose-Capillary: 353 mg/dL — ABNORMAL HIGH (ref 70–99)

## 2019-10-06 LAB — CORTISOL-AM, BLOOD: Cortisol - AM: 51.9 ug/dL — ABNORMAL HIGH (ref 6.7–22.6)

## 2019-10-06 LAB — SARS CORONAVIRUS 2 (TAT 6-24 HRS): SARS Coronavirus 2: NEGATIVE

## 2019-10-06 LAB — INFLUENZA PANEL BY PCR (TYPE A & B)
Influenza A By PCR: NEGATIVE
Influenza B By PCR: NEGATIVE

## 2019-10-06 LAB — SALICYLATE LEVEL: Salicylate Lvl: <7 mg/dL — ABNORMAL LOW (ref 7.0–30.0)

## 2019-10-06 LAB — LACTIC ACID, PLASMA
Lactic Acid, Venous: 4.3 mmol/L (ref 0.5–1.9)
Lactic Acid, Venous: 2.5 mmol/L (ref 0.5–1.9)

## 2019-10-06 LAB — HIV ANTIBODY (ROUTINE TESTING W REFLEX): HIV Screen 4th Generation wRfx: NONREACTIVE

## 2019-10-06 LAB — PROTIME-INR
INR: 1 (ref 0.8–1.2)
Prothrombin Time: 13.5 seconds (ref 11.4–15.2)

## 2019-10-06 LAB — TROPONIN I (HIGH SENSITIVITY): Troponin I (High Sensitivity): 9 ng/L (ref <18)

## 2019-10-06 LAB — ETHANOL: Alcohol, Ethyl (B): <10 mg/dL (ref <10)

## 2019-10-06 LAB — MAGNESIUM: Magnesium: 1.6 mg/dL — ABNORMAL LOW (ref 1.7–2.4)

## 2019-10-06 LAB — PHOSPHORUS: Phosphorus: 2.2 mg/dL — ABNORMAL LOW (ref 2.5–4.6)

## 2019-10-06 LAB — PROCALCITONIN: Procalcitonin: 2.13 ng/mL

## 2019-10-06 IMAGING — MR MR HEAD WO/W CM
11 of 13 series · 39 of 48 positions shown · IV contrast (gadavist)
Comparison: Head CT yesterday.

CLINICAL DATA: Severe pain in the left ear. Headache. Altered
mental status. Lethargy.

EXAM:
MRI HEAD WITHOUT AND WITH CONTRAST
TECHNIQUE: Multiplanar, multiecho pulse sequences of the brain and surrounding
structures were obtained without and with intravenous contrast.
CONTRAST:  10mL GADAVIST GADOBUTROL 1 MMOL/ML IV SOLN

[Series 5: ax dwi_tracew · axial · 3.0mm · 0.60mm/px · z∈[-57,+97]mm · 5 of 48 slices shown]
[im 1/48]
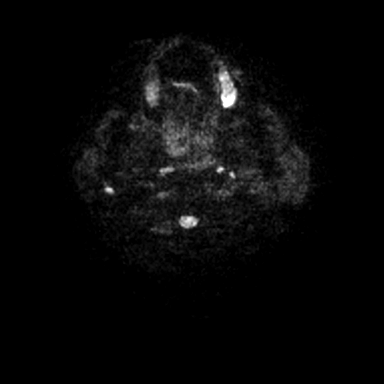
[im 12/48]
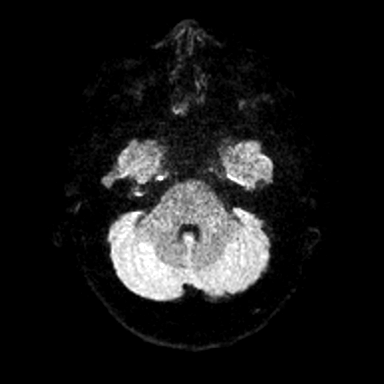
[im 24/48]
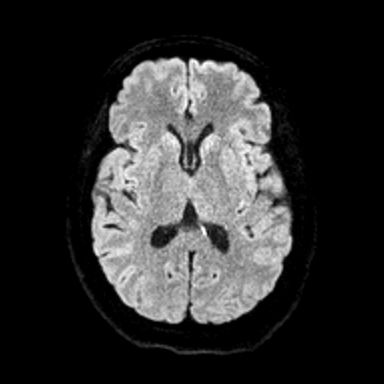
[im 36/48]
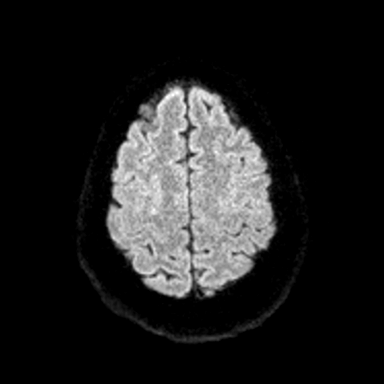
[im 48/48]
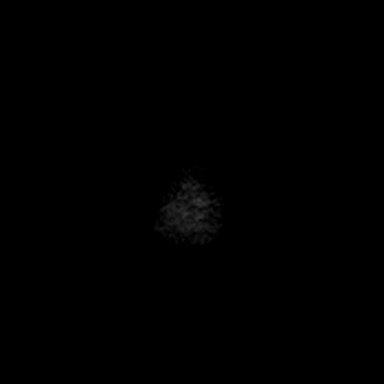

[Series 6: ax dwi_adc · axial · 3.0mm · 0.60mm/px · z∈[-57,+97]mm · 5 of 48 slices shown]
[im 1/48]
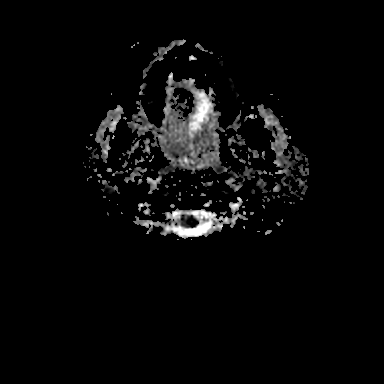
[im 12/48]
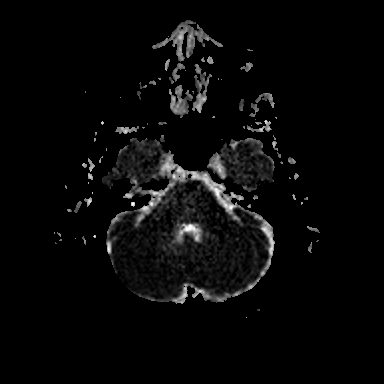
[im 24/48]
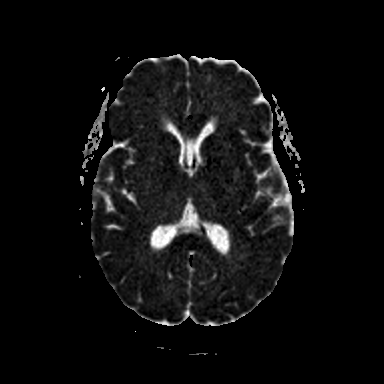
[im 36/48]
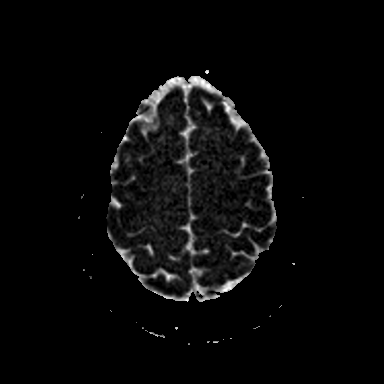
[im 48/48]
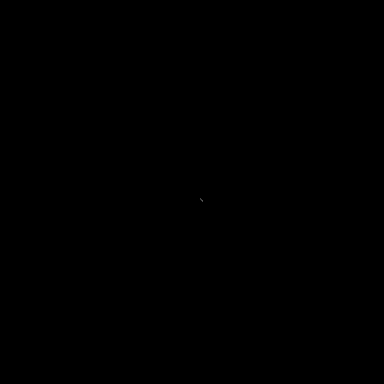

[Series 7: cor dwi_tracew · coronal · 5.0mm · 0.60mm/px · 3 of 40 slices shown]
[im 1/40]
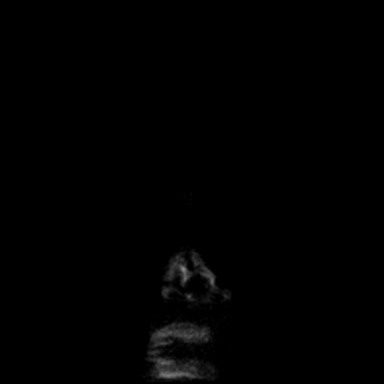
[im 10/40]
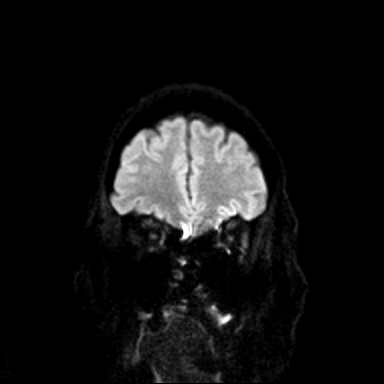
[im 20/40]
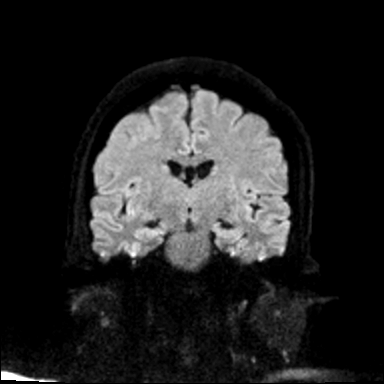

[Series 9: T1 · sagittal · 5.0mm · 0.94mm/px · 3 of 23 slices shown (1 of 3)]
[im 1/23]
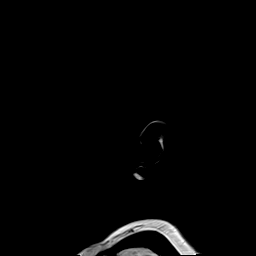
[im 12/23]
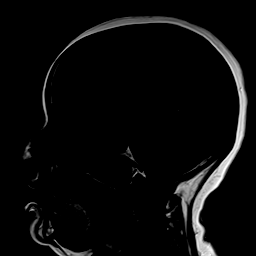
[im 23/23]
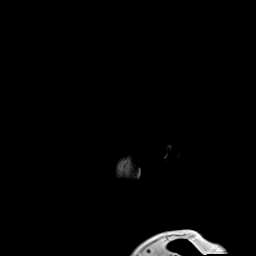

[Series 10: T2 · axial · 5.0mm · 0.45mm/px · z∈[-56,+99]mm · 3 of 27 slices shown (1 of 2)]
[im 1/27]
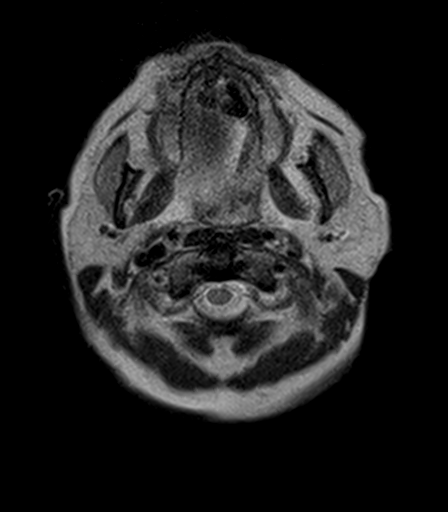
[im 14/27]
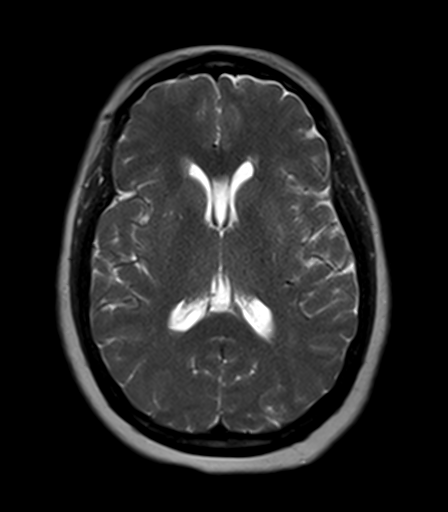
[im 27/27]
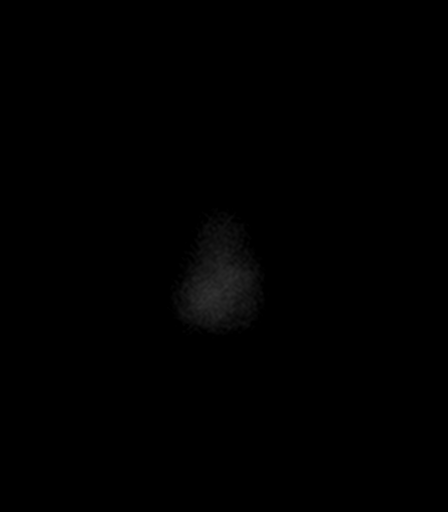

[Series 12: FLAIR · axial · 5.0mm · 1.20mm/px · z∈[-57,+98]mm · 3 of 27 slices shown]
[im 1/27]
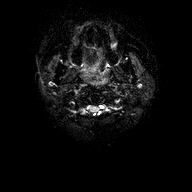
[im 14/27]
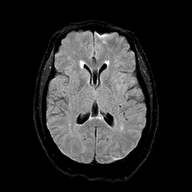
[im 27/27]
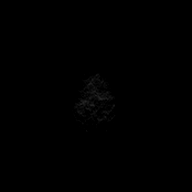

[Series 13: T1 · axial · 5.0mm · 0.90mm/px · z∈[-56,+99]mm · 3 of 27 slices shown (2 of 3)]
[im 1/27]
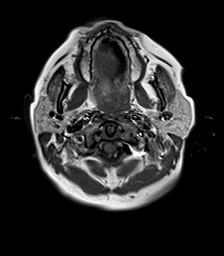
[im 14/27]
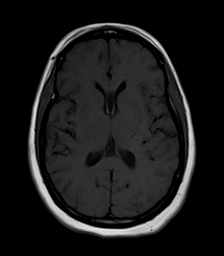
[im 27/27]
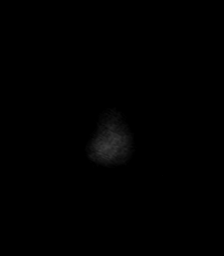

[Series 14: T2 · coronal · 5.0mm · 0.45mm/px · 4 of 31 slices shown (2 of 2)]
[im 1/31]
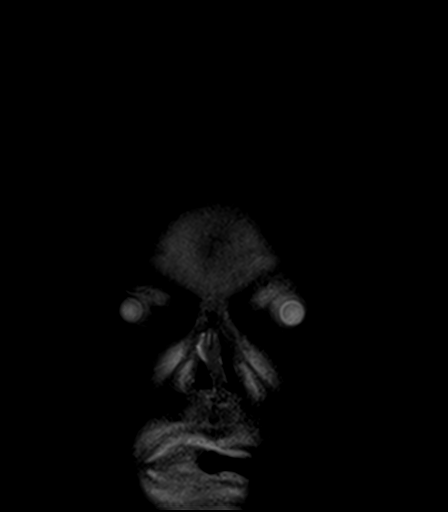
[im 11/31]
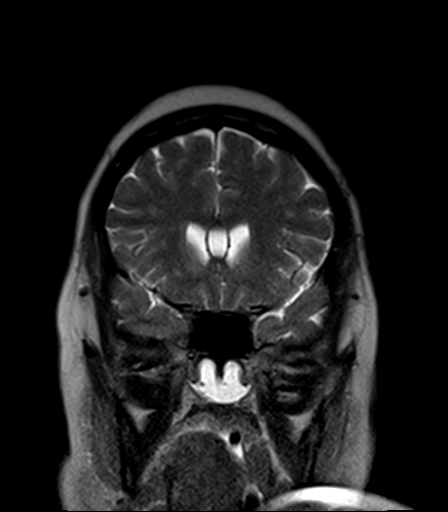
[im 21/31]
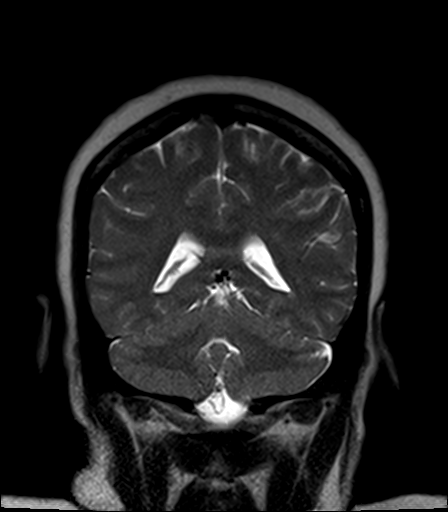
[im 31/31]
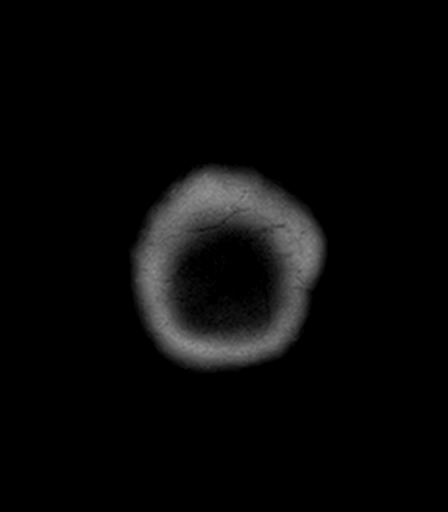

[Series 15: T1 post-contrast · axial · 5.0mm · 0.90mm/px · z∈[-56,+99]mm · 3 of 27 slices shown (1 of 2)]
[im 1/27]
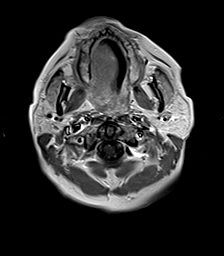
[im 14/27]
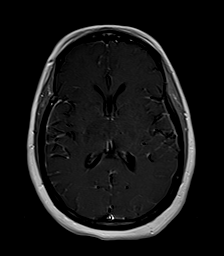
[im 27/27]
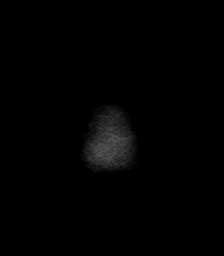

[Series 16: T1 post-contrast · coronal · 5.0mm · 0.90mm/px · 4 of 31 slices shown (2 of 2)]
[im 1/31]
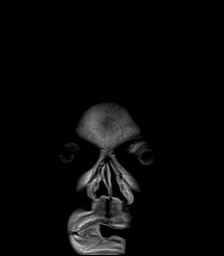
[im 11/31]
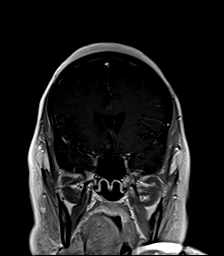
[im 21/31]
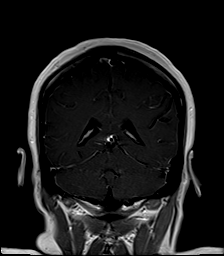
[im 31/31]
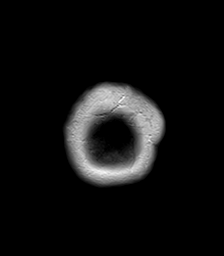

[Series 17: T1 · sagittal · 5.0mm · 0.94mm/px · 3 of 23 slices shown (3 of 3)]
[im 1/23]
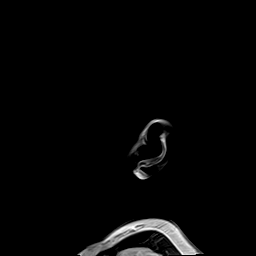
[im 12/23]
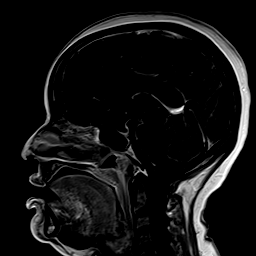
[im 23/23]
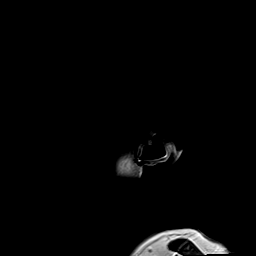

[39 of 48 positions shown; findings below may reference images not displayed]

FINDINGS: Brain: There are 3 and possibly 4 foci of abnormal restricted
diffusion. There is a subcentimeter focus along the lateral margin
of the occipital horn of the right lateral ventricle. There is
linear restricted diffusion along the medial margin the atrium of
the left lateral ventricle. There is a 9 mm focus of restricted
diffusion affecting the cortex of the posterior left temporal lobe.
There may be a fourth focus at the posteromedial left parietal
cortex. This is an unusual pattern. Elsewhere, there is a ordinary
pattern of small-vessel disease within the cerebral hemispheric
white matter. No large vessel territory infarction. No
hydrocephalus. No extra-axial fluid collection. After contrast
administration rapid sequences were done which show intravascular
enhancement. I think this is probably an artifact of the pulse
sequence of LULU, but this does raise the possibility of
leptomeningeal enhancement as might be seen meningitis. One could
question very subtle fluid fluid levels in the occipital horns of
the lateral ventricles. Aside from meningitis, I think the
differential diagnosis could include vasculitis and an unusual
manifestation of embolic infarctions.

Vascular: Major vessels at the base of the brain show flow.

Skull and upper cervical spine: Negative

Sinuses/Orbits: Clear/normal

Other: None
IMPRESSION: Unusual examination showing 3 or 4 small foci of restricted
diffusion at the right lateral ventricle occipital horn, medial
ependymal wall of the atrium of the left lateral ventricle,
superficial cortex of the left posterior temporal lobe, and possibly
the superficial cortex at the left parietal vertex. Question
abnormal intravascular enhancement versus artifact of rapid pulse
sequence. Question subtle fluid fluid levels in the occipital horns
of the lateral ventricles. Diagnoses I would consider include
sequela of meningitis, vasculitis or unusual manifestation of
embolic infarction. I think lumbar puncture should be considered.

## 2019-10-06 IMAGING — DX DG CHEST 1V PORT
1 series · 1 of 1 positions shown · non-contrast
Comparison: Radiograph [DATE]

CLINICAL DATA: ETT position

EXAM:
PORTABLE CHEST 1 VIEW

[chest ap]
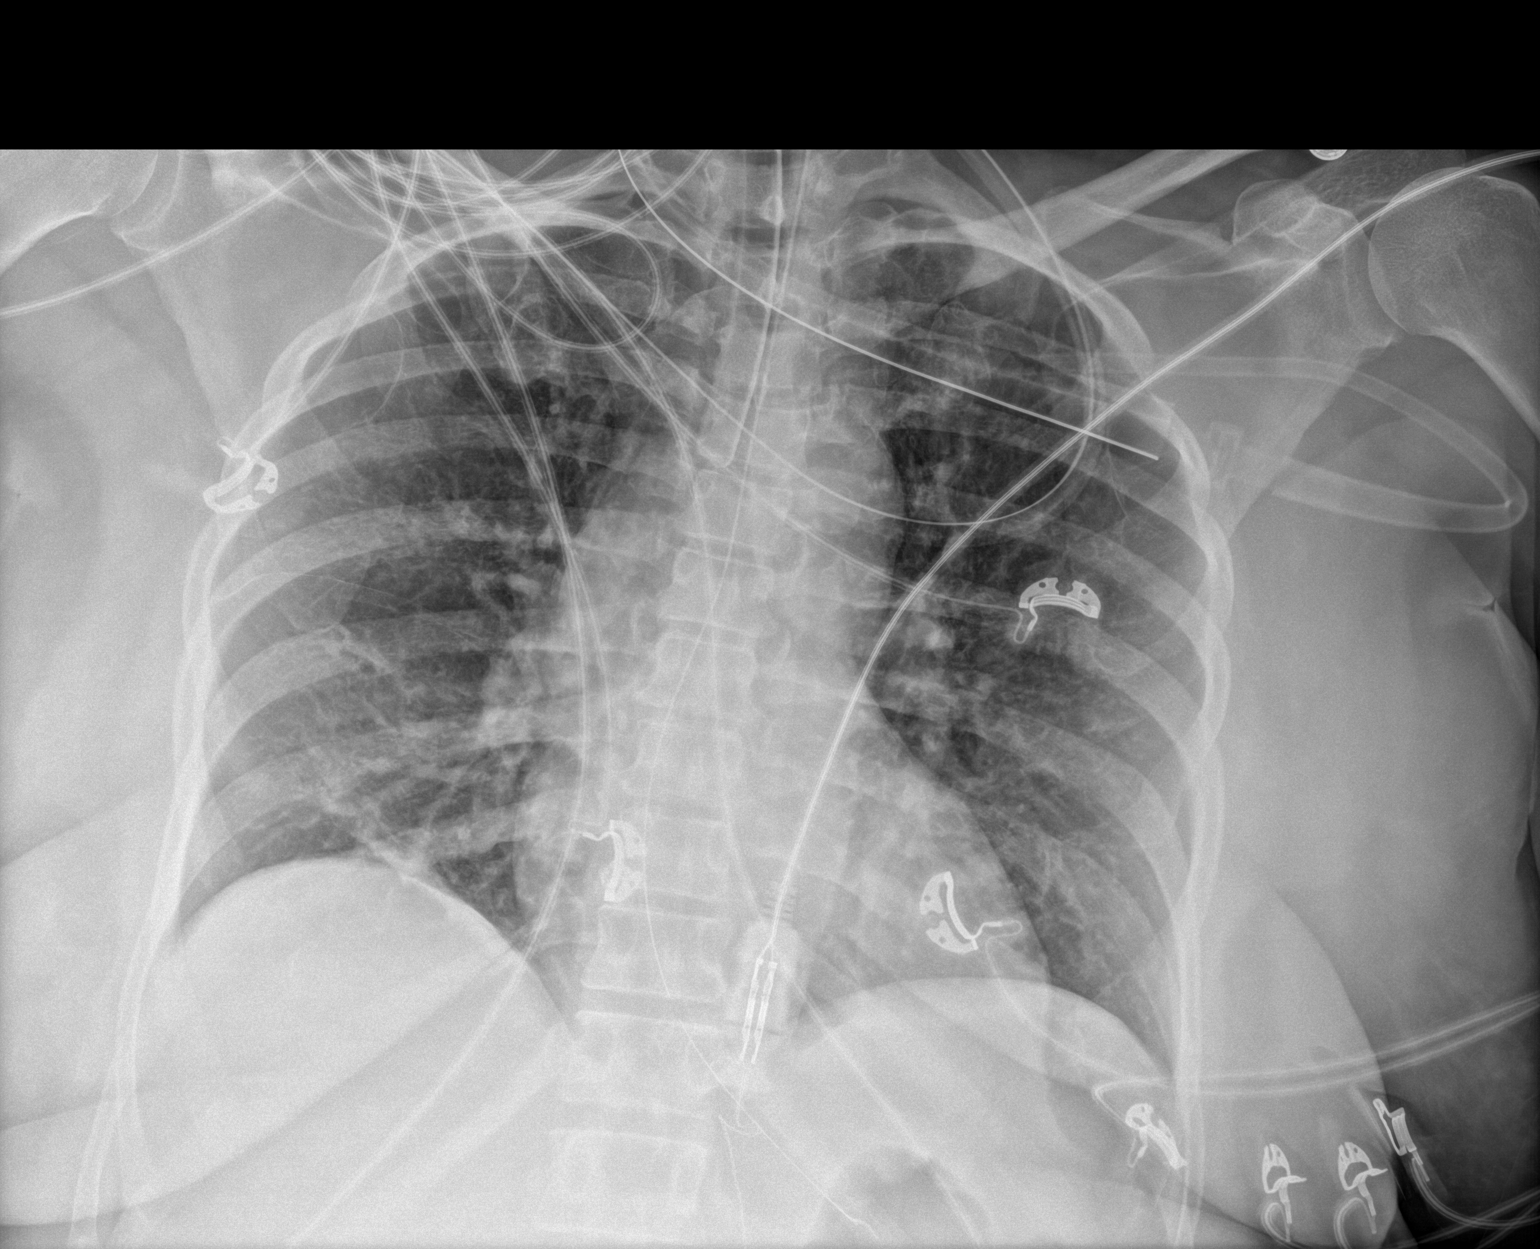

[1 of 1 positions shown; findings below may reference images not displayed]

FINDINGS: Low positioning of the endotracheal tube within the lower trachea,
approximately 1.8 cm from the carina. Could be retracted at least 2
cm to position in the mid trachea. A transesophageal tube tip is
beyond the GE junction while the side port remains in the distal
thoracic esophagus, recommend advancing at least 5 cm to position
the side port within the gastric lumen for optimal functioning.
Telemetry leads and additional monitoring devices overlie the chest.

There are some streaky and patchy opacities in the lungs. No
pneumothorax. No visible effusion. Cardiomediastinal contours are
stable accounting for differences in projection. No acute osseous or
soft tissue abnormality.
IMPRESSION: Streaky opacities in the lungs may reflect atelectasis though
infection could have a similar appearance.

Low positioning of the endotracheal tube, consider retracting 2 cm
to position in the mid trachea.

Transesophageal tube side port is in the distal thoracic esophagus,
consider advancing 5 cm to position in the gastric lumen for optimal
functioning.

These results will be called to the ordering clinician or
representative by the Radiologist Assistant, and communication
documented in the PACS or zVision Dashboard.

## 2019-10-06 IMAGING — MR MR NECK WO/W CM
9 series · 48 of 48 positions shown · IV contrast (gadavist)
Comparison: None.

CLINICAL DATA: Altered mental status. Sepsis. Presented yesterday
with left ear pain

EXAM:
MRI OF THE NECK WITH CONTRAST
TECHNIQUE: Multiplanar, multisequence MR imaging was performed following the
administration of intravenous contrast.
CONTRAST:  10mL GADAVIST GADOBUTROL 1 MMOL/ML IV SOLN

[Series 2: T1 · coronal · 4.0mm · 0.75mm/px · 4 of 25 slices shown (1 of 3)]
[im 1/25]
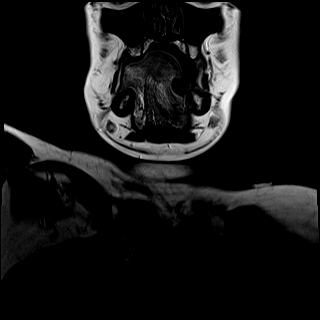
[im 9/25]
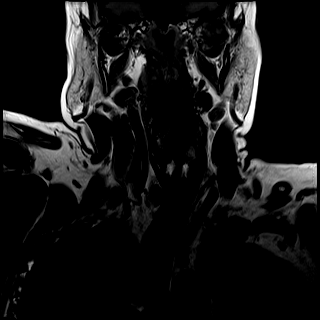
[im 17/25]
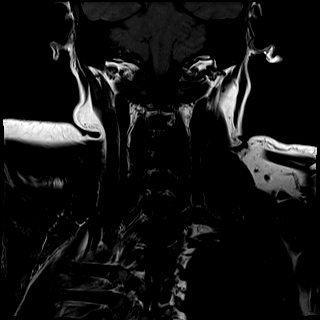
[im 25/25]
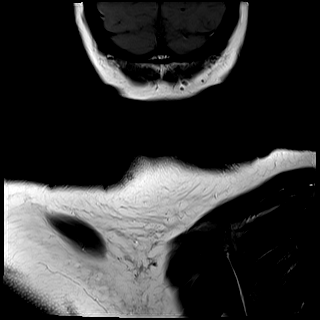

[Series 3: T1 · sagittal · 4.0mm · 0.75mm/px · 5 of 29 slices shown (2 of 3)]
[im 1/29]
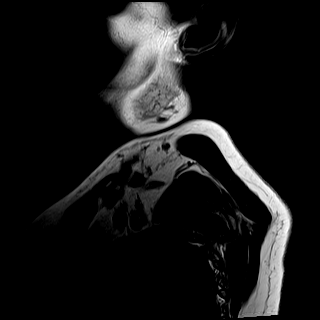
[im 8/29]
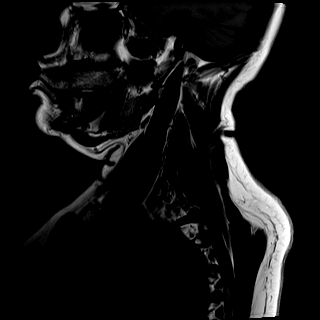
[im 15/29]
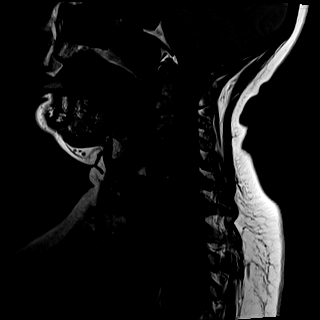
[im 22/29]
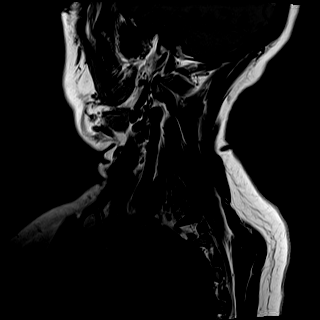
[im 29/29]
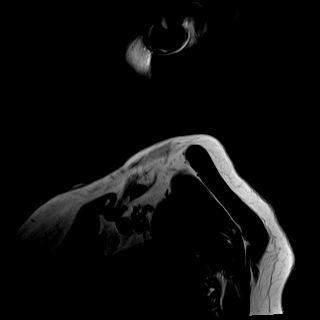

[Series 6: T1 · axial · 4.0mm · 0.94mm/px · z∈[-207,-69]mm · 5 of 30 slices shown (3 of 3)]
[im 1/30]
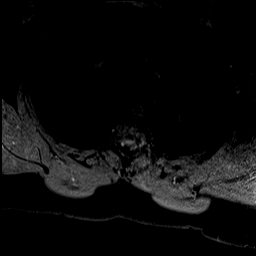
[im 8/30]
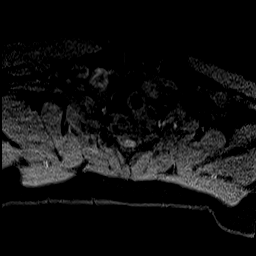
[im 15/30]
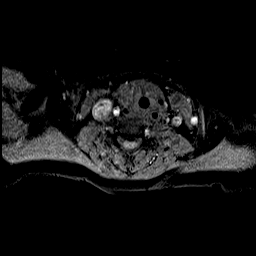
[im 22/30]
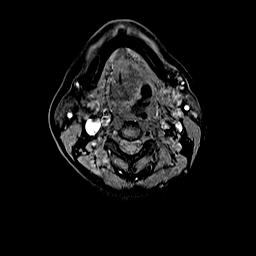
[im 30/30]
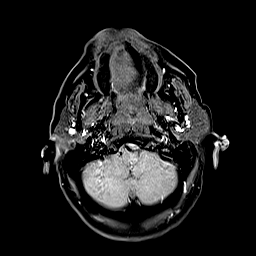

[Series 8: T2 · coronal · 4.0mm · 0.75mm/px · 5 of 25 slices shown (1 of 3)]
[im 1/25]
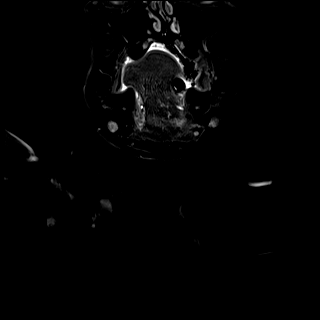
[im 7/25]
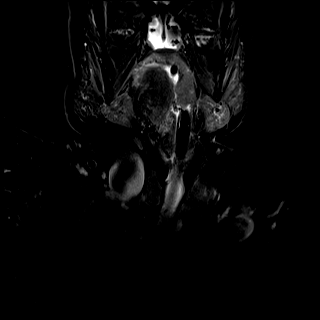
[im 13/25]
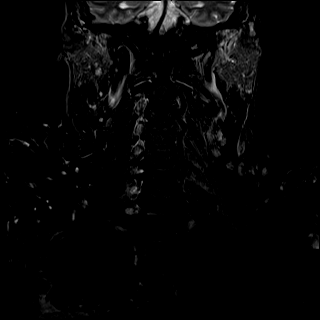
[im 19/25]
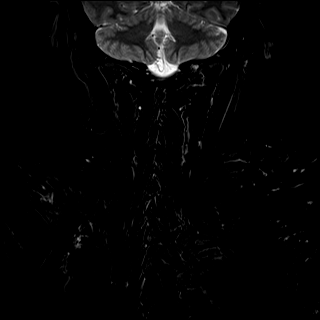
[im 25/25]
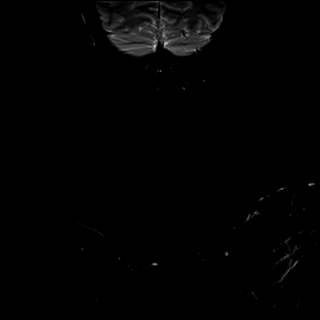

[Series 10: T2 · sagittal · 4.0mm · 0.75mm/px · 6 of 29 slices shown (2 of 3)]
[im 1/29]
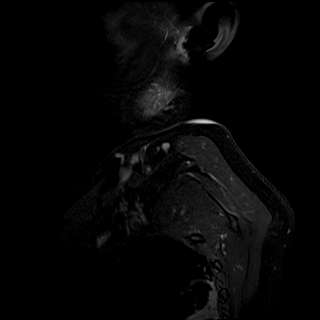
[im 6/29]
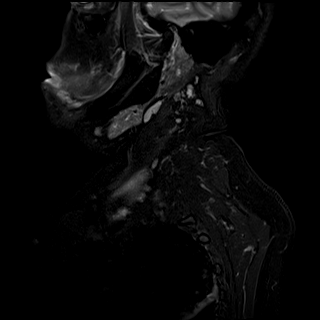
[im 12/29]
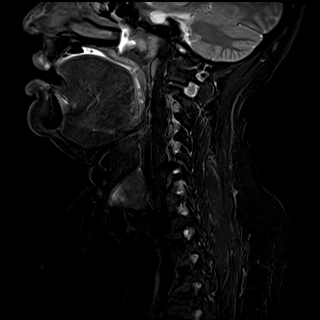
[im 17/29]
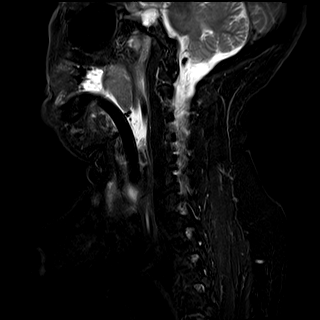
[im 23/29]
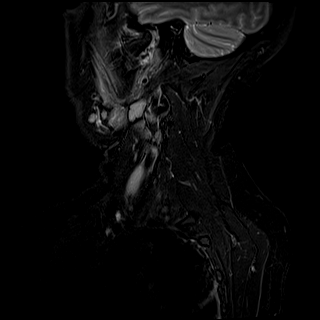
[im 29/29]
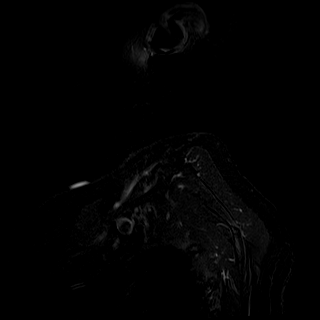

[Series 12: T2 · axial · 4.0mm · 0.94mm/px · z∈[-207,-69]mm · 6 of 30 slices shown (3 of 3)]
[im 1/30]
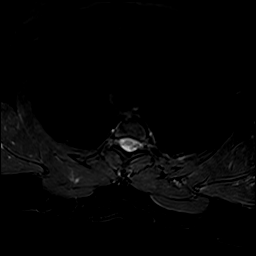
[im 6/30]
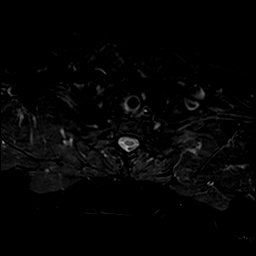
[im 12/30]
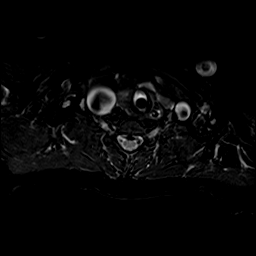
[im 18/30]
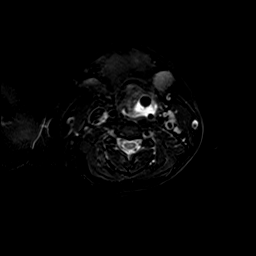
[im 24/30]
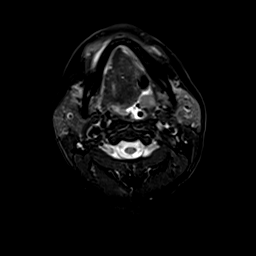
[im 30/30]
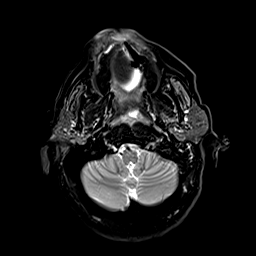

[Series 14: T1 fat-sat · coronal · 4.0mm · 0.94mm/px · 5 of 25 slices shown (1 of 3)]
[im 1/25]
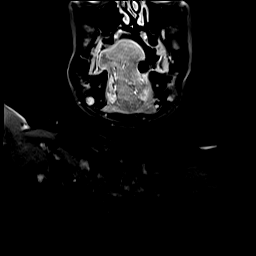
[im 7/25]
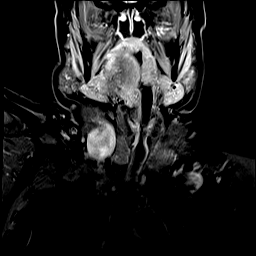
[im 13/25]
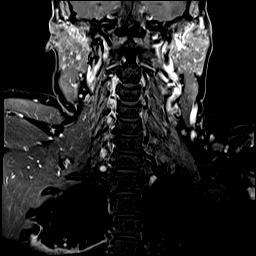
[im 19/25]
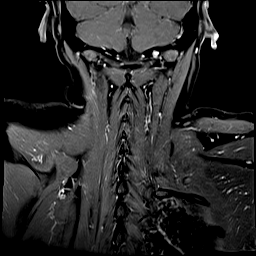
[im 25/25]
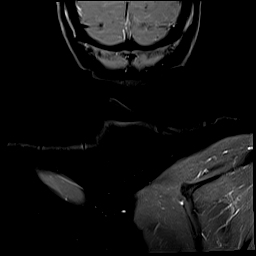

[Series 16: T1 fat-sat · sagittal · 4.0mm · 0.94mm/px · 6 of 29 slices shown (2 of 3)]
[im 1/29]
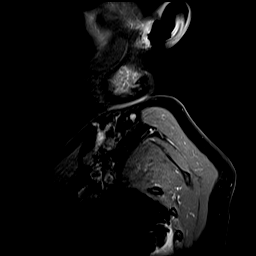
[im 6/29]
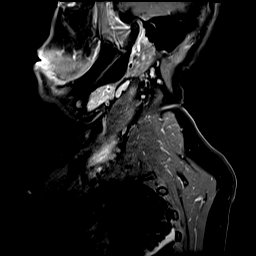
[im 12/29]
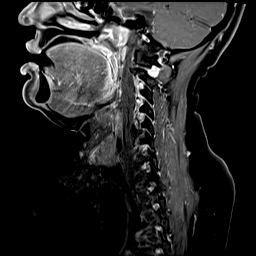
[im 17/29]
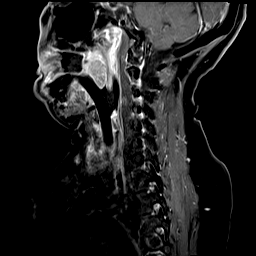
[im 23/29]
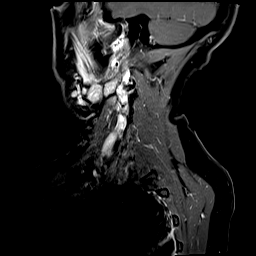
[im 29/29]
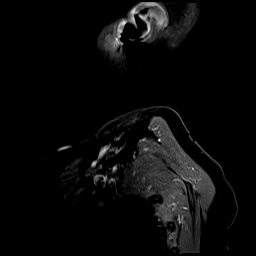

[Series 18: T1 fat-sat · axial · 4.0mm · 0.94mm/px · z∈[-207,-69]mm · 6 of 30 slices shown (3 of 3)]
[im 1/30]
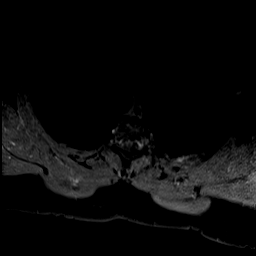
[im 6/30]
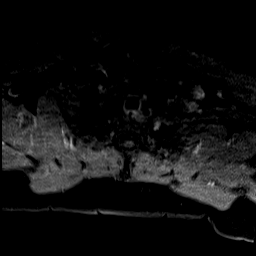
[im 12/30]
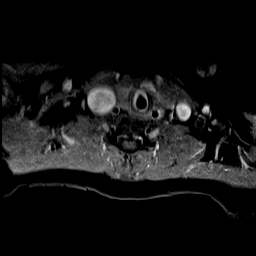
[im 18/30]
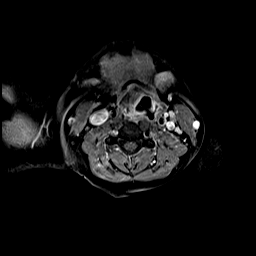
[im 24/30]
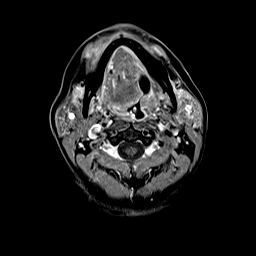
[im 30/30]
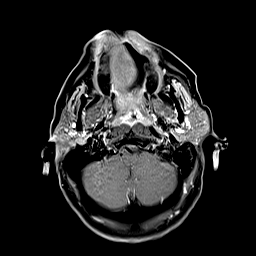

[48 of 48 positions shown; findings below may reference images not displayed]

FINDINGS: Pharynx and larynx: Patient intubated. Hypopharynx is deviated
towards the left because of the intubation. No primary lesion seen.

Salivary glands: Negative

Thyroid: Negative

Lymph nodes: None

Vascular: Jugular veins are patent. Arterial structures difficult to
evaluate but appear to show flow.

Limited intracranial: See results of brain MRI.

Visualized orbits: Not included.

Mastoids and visualized paranasal sinuses: See results of brain MRI.

Skeleton: Negative.  No evidence of spinal infection.

Upper chest: Negative

Other: None
IMPRESSION: Endotracheal intubation. Orogastric tube placement. No evidence of
intrinsic primary pathology in the neck. Note that this does not
represent a vascular evaluation but a soft tissue evaluation of the
neck.

## 2019-10-06 IMAGING — DX DG ABDOMEN 1V
2 series · 2 of 2 positions shown · non-contrast
Comparison: None.

CLINICAL DATA: Sepsis

EXAM:
ABDOMEN - 1 VIEW

[abdomen supine (1 of 2)]
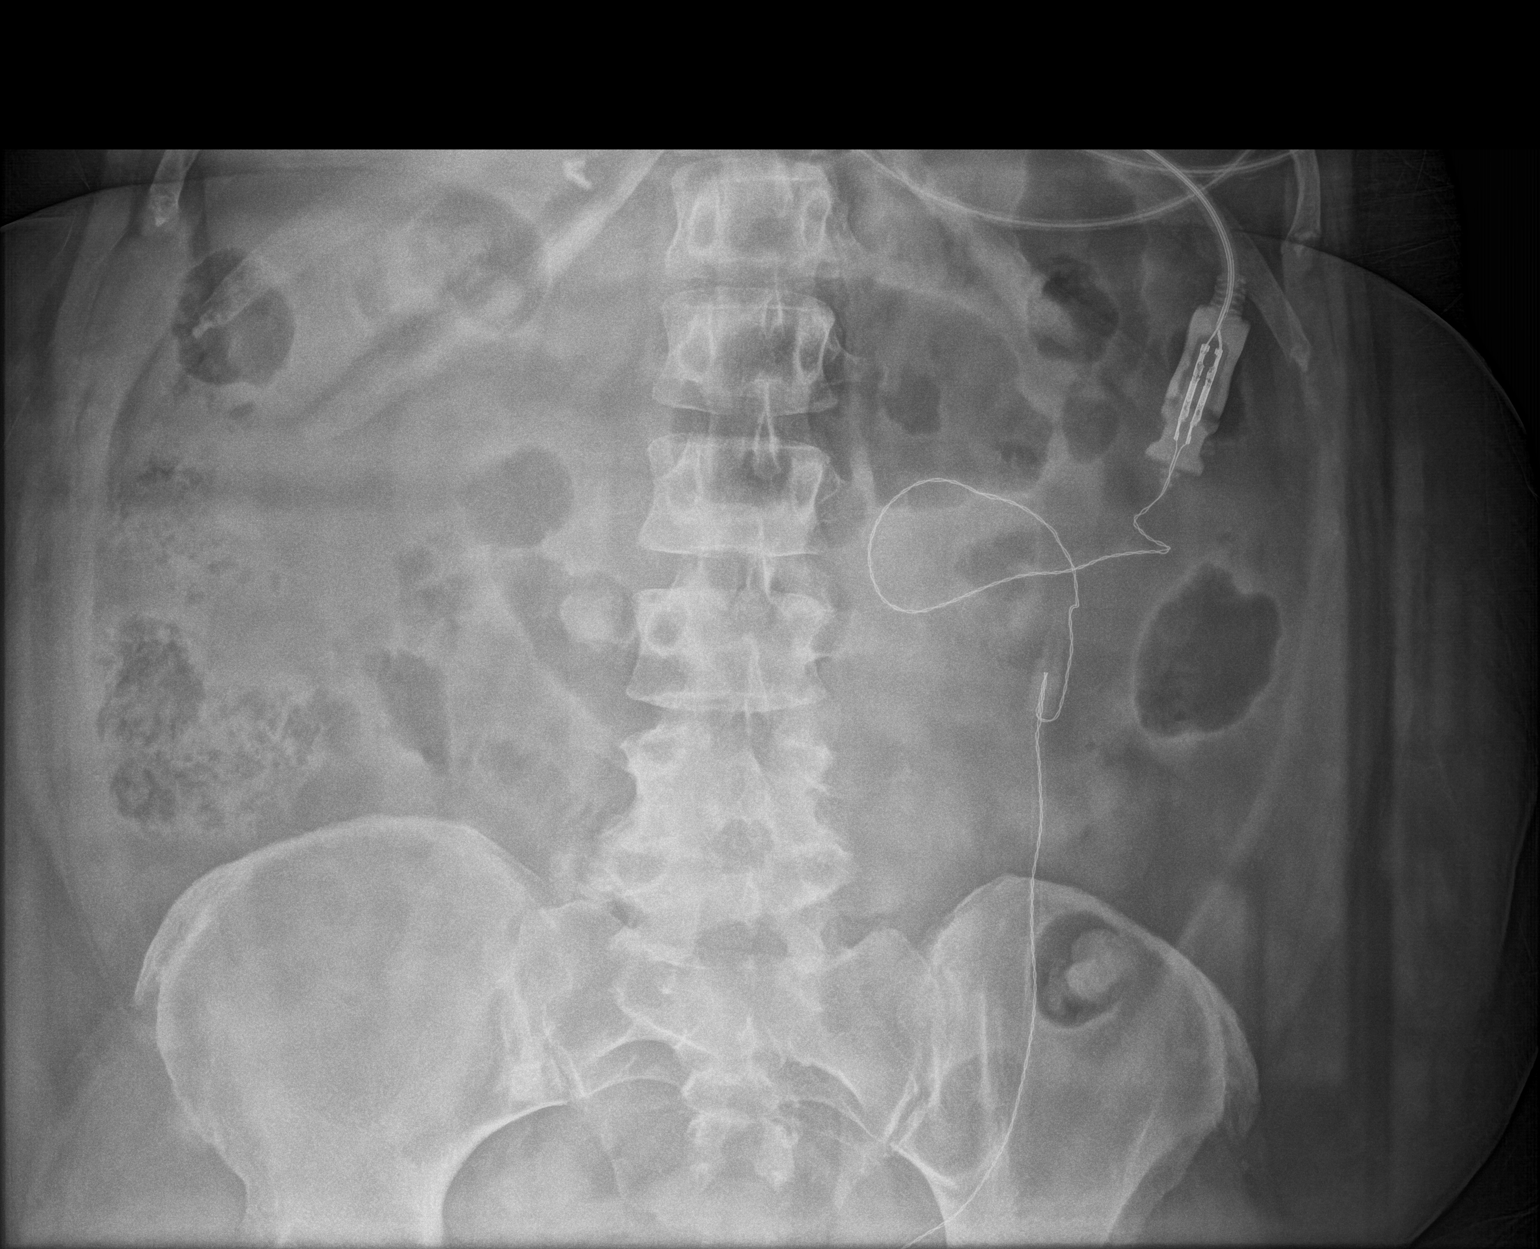

[abdomen supine (2 of 2)]
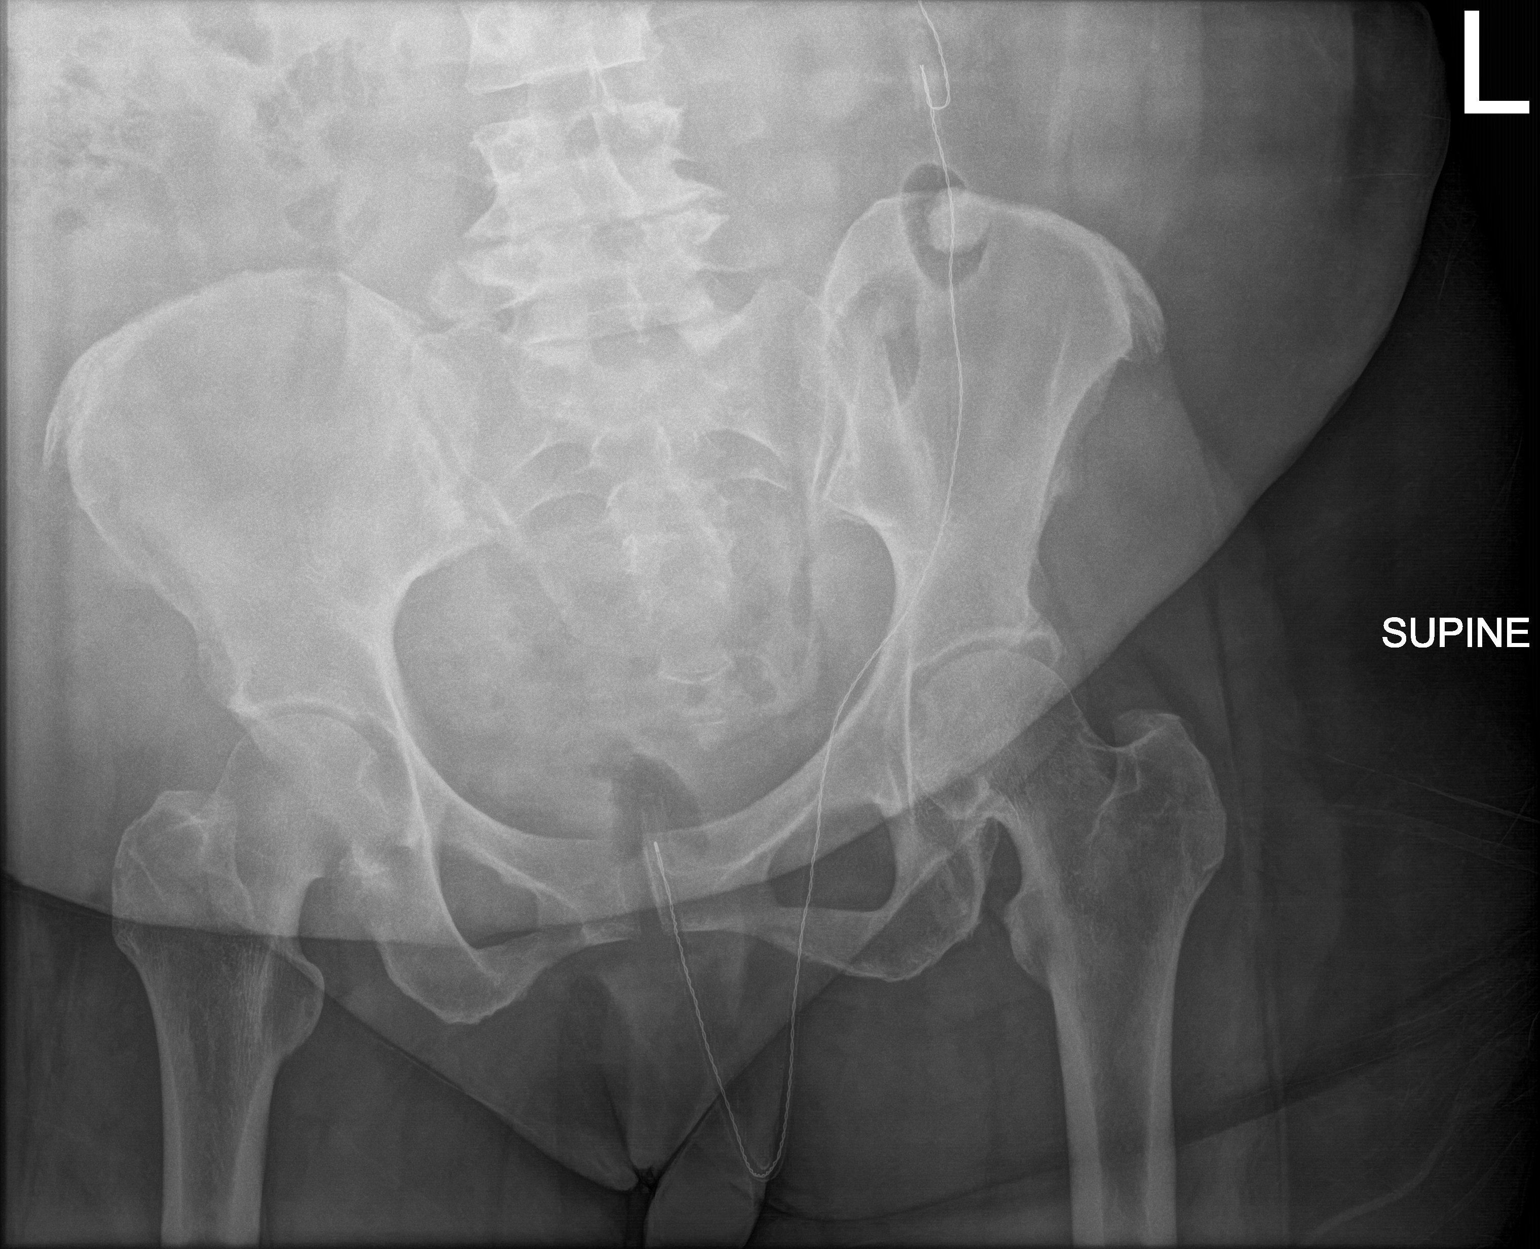

[2 of 2 positions shown; findings below may reference images not displayed]

FINDINGS: The bowel gas pattern is normal. Clips over the right upper quadrant
usually from cholecystectomy. No concerning mass effect or gas
collection. L4-5 focal disc degeneration.
IMPRESSION: Normal bowel gas pattern.

## 2019-10-06 IMAGING — DX DG ABDOMEN 1V
2 series · 2 of 2 positions shown · non-contrast
Comparison: Concurrent chest radiograph, abdominal radiograph
[DATE]

CLINICAL DATA: Tube placement

EXAM:
ABDOMEN - 1 VIEW

[abdomen supine (1 of 2)]
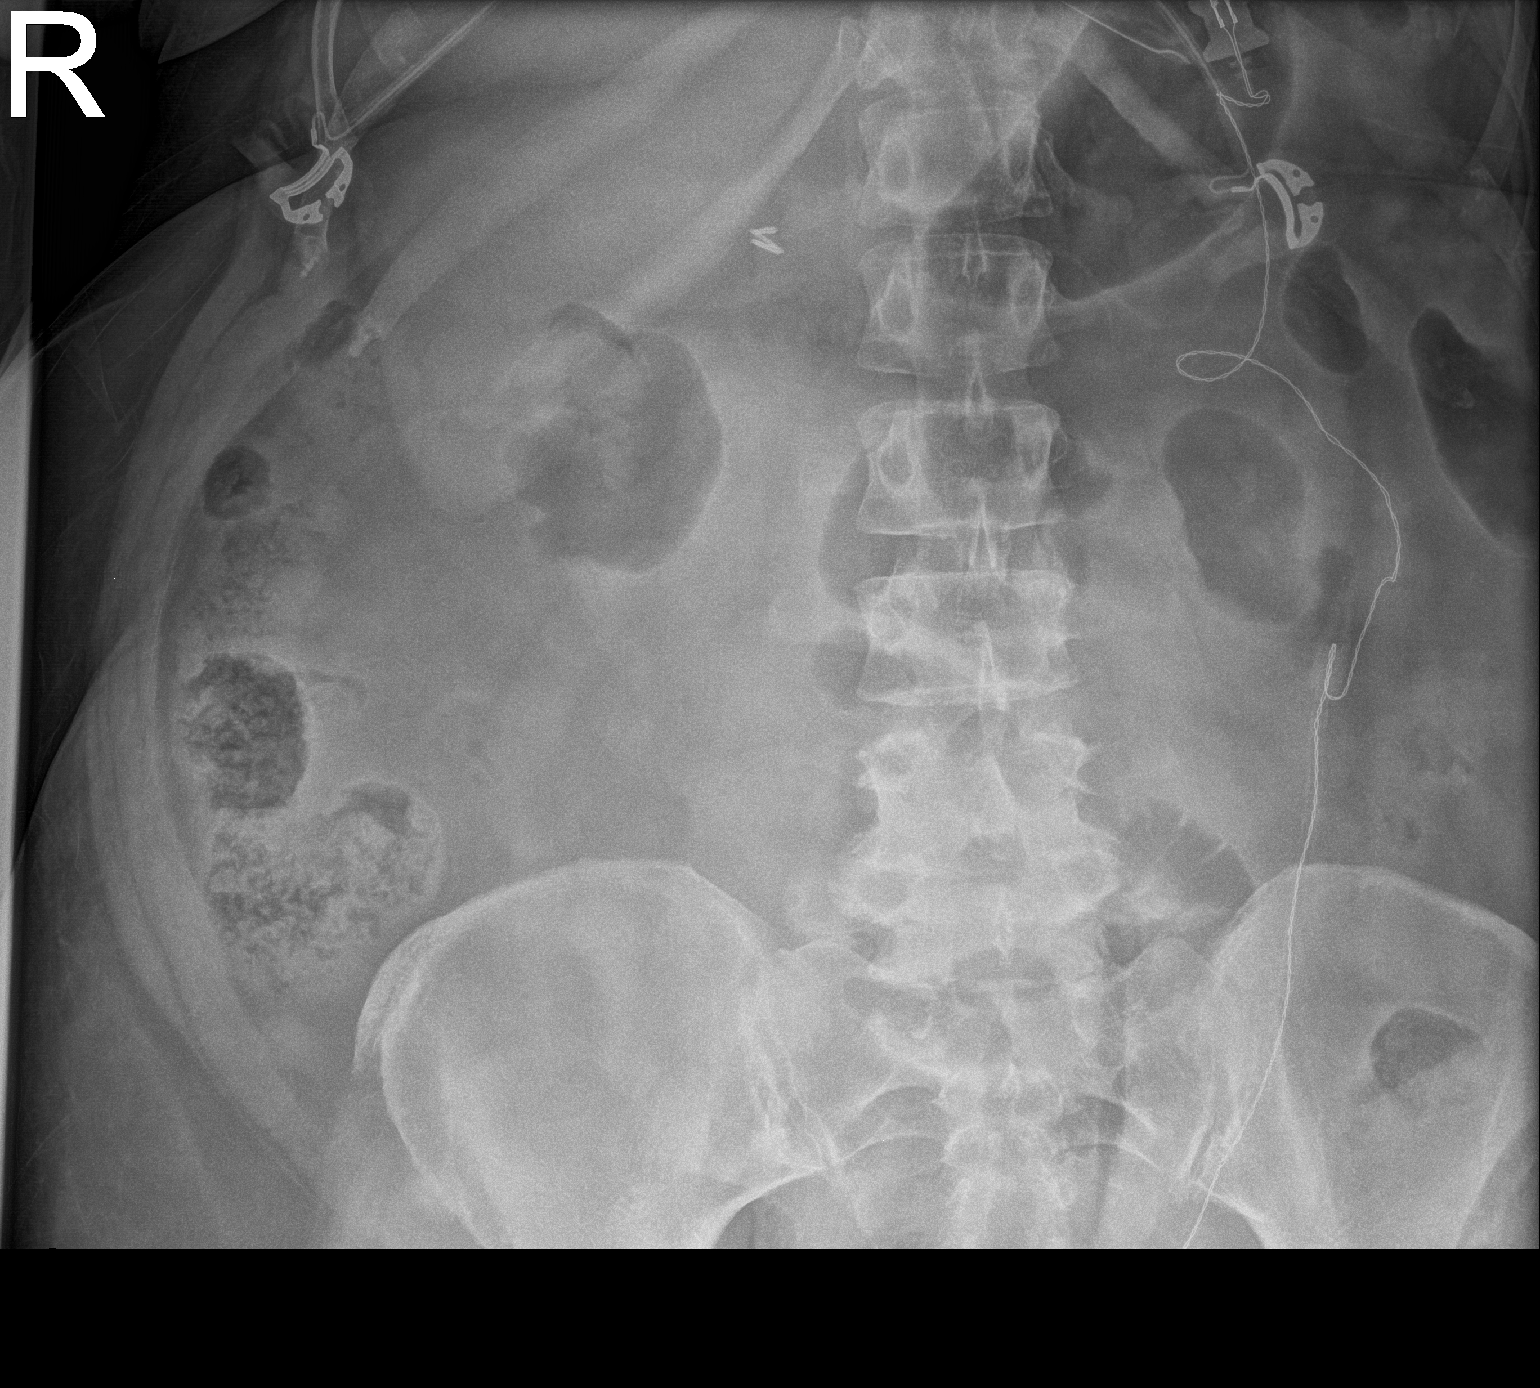

[abdomen supine (2 of 2)]
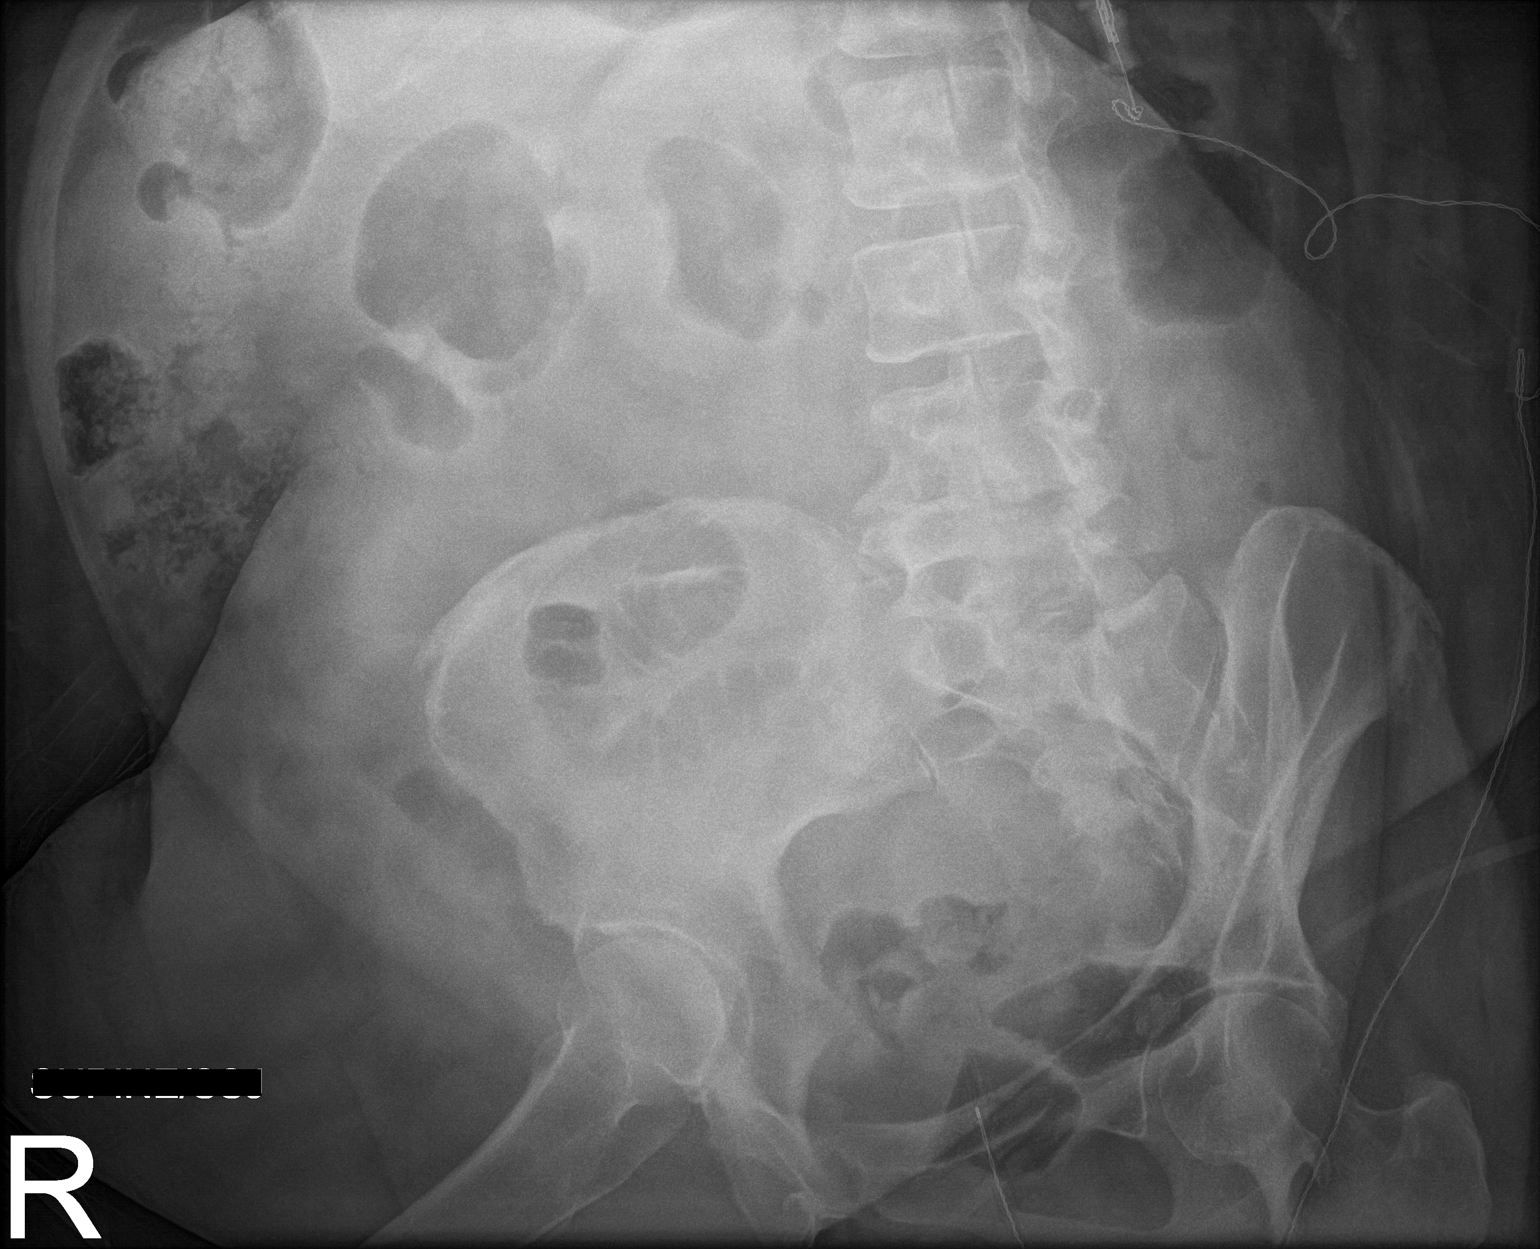

[2 of 2 positions shown; findings below may reference images not displayed]

FINDINGS: The tip of the transesophageal tube projects over the gastric lumen.
The side port is not visualized on this exam, projecting over the
lower thoracic esophagus on concurrent chest radiograph. No
high-grade obstructive bowel gas pattern is seen. Few nonspecific
air-filled loops of bowel are present in the abdomen.
Cholecystectomy clips project over the right upper quadrant. A Foley
catheter is noted in place. A monitoring device projects over the
left hemiabdomen. Mild degenerative changes in the spine and pelvis.
Osseous structures are otherwise unremarkable.
IMPRESSION: The tip of the transesophageal tube projects over the gastric lumen.
The side port is not visualized on this exam, but is seen projecting
over the lower thoracic esophagus on concurrent chest radiograph.
This tube should therefore be advanced at least 5 cm for optimal
functioning.

## 2019-10-06 MED ORDER — HYDRALAZINE HCL 20 MG/ML IJ SOLN
10.0000 mg | Freq: Four times a day (QID) | INTRAMUSCULAR | Status: DC | PRN
  Administered 2019-10-06: 20 mg via INTRAVENOUS
  Filled 2019-10-06: qty 1

## 2019-10-06 MED ORDER — VECURONIUM BROMIDE 10 MG IV SOLR
INTRAVENOUS | Status: AC
  Filled 2019-10-06: qty 10

## 2019-10-06 MED ORDER — LACTATED RINGERS IV BOLUS (SEPSIS): 1000.0000 mL | Freq: Once | INTRAVENOUS | Status: DC

## 2019-10-06 MED ORDER — MIDAZOLAM HCL 2 MG/2ML IJ SOLN
4.0000 mg | Freq: Once | INTRAMUSCULAR | Status: AC
  Administered 2019-10-06: 4 mg via INTRAVENOUS
  Filled 2019-10-06: qty 4

## 2019-10-06 MED ORDER — DEXMEDETOMIDINE HCL IN NACL 400 MCG/100ML IV SOLN
0.4000 ug/kg/h | INTRAVENOUS | Status: DC
  Administered 2019-10-06: 0.5 ug/kg/h via INTRAVENOUS
  Filled 2019-10-06: qty 100

## 2019-10-06 MED ORDER — FENTANYL 2500MCG IN NS 250ML (10MCG/ML) PREMIX INFUSION
50.0000 ug/h | INTRAVENOUS | Status: DC
  Administered 2019-10-06 – 2019-10-08 (×6): 200 ug/h via INTRAVENOUS
  Administered 2019-10-09: 350 ug/h via INTRAVENOUS
  Administered 2019-10-09: 200 ug/h via INTRAVENOUS
  Administered 2019-10-10: 300 ug/h via INTRAVENOUS
  Filled 2019-10-06 (×9): qty 250

## 2019-10-06 MED ORDER — MORPHINE SULFATE (PF) 2 MG/ML IV SOLN
1.0000 mg | INTRAVENOUS | Status: DC | PRN
  Administered 2019-10-06: 1 mg via INTRAVENOUS
  Filled 2019-10-06: qty 1

## 2019-10-06 MED ORDER — SODIUM CHLORIDE 0.9 % IV SOLN: INTRAVENOUS | Status: DC

## 2019-10-06 MED ORDER — ROCURONIUM BROMIDE 50 MG/5ML IV SOLN
50.0000 mg | Freq: Once | INTRAVENOUS | Status: AC
  Administered 2019-10-06: 50 mg via INTRAVENOUS
  Filled 2019-10-06: qty 1

## 2019-10-06 MED ORDER — LORAZEPAM 2 MG/ML IJ SOLN
INTRAMUSCULAR | Status: AC
  Administered 2019-10-06: 0.25 mg via INTRAVENOUS
  Filled 2019-10-06: qty 1

## 2019-10-06 MED ORDER — ACETAMINOPHEN 650 MG RE SUPP
650.0000 mg | Freq: Four times a day (QID) | RECTAL | Status: DC | PRN
  Administered 2019-10-06: 650 mg via RECTAL
  Filled 2019-10-06: qty 1

## 2019-10-06 MED ORDER — INSULIN ASPART 100 UNIT/ML ~~LOC~~ SOLN
0.0000 [IU] | SUBCUTANEOUS | Status: DC
  Administered 2019-10-06: 11 [IU] via SUBCUTANEOUS
  Administered 2019-10-06: 20 [IU] via SUBCUTANEOUS
  Administered 2019-10-06: 11 [IU] via SUBCUTANEOUS
  Filled 2019-10-06 (×3): qty 1

## 2019-10-06 MED ORDER — SODIUM BICARBONATE 8.4 % IV SOLN
50.0000 meq | Freq: Once | INTRAVENOUS | Status: AC
  Administered 2019-10-06: 50 meq via INTRAVENOUS
  Filled 2019-10-06: qty 50

## 2019-10-06 MED ORDER — SODIUM CHLORIDE 0.9 % IV SOLN
2.0000 g | Freq: Two times a day (BID) | INTRAVENOUS | Status: DC
  Administered 2019-10-06 – 2019-10-16 (×21): 2 g via INTRAVENOUS
  Filled 2019-10-06 (×2): qty 2
  Filled 2019-10-06 (×2): qty 20
  Filled 2019-10-06 (×4): qty 2
  Filled 2019-10-06: qty 20
  Filled 2019-10-06: qty 2
  Filled 2019-10-06 (×2): qty 20
  Filled 2019-10-06 (×2): qty 2
  Filled 2019-10-06 (×4): qty 20
  Filled 2019-10-06 (×7): qty 2

## 2019-10-06 MED ORDER — FENTANYL BOLUS VIA INFUSION
50.0000 ug | INTRAVENOUS | Status: DC | PRN
  Administered 2019-10-07 – 2019-10-10 (×4): 50 ug via INTRAVENOUS
  Filled 2019-10-06: qty 50

## 2019-10-06 MED ORDER — MAGNESIUM SULFATE 2 GM/50ML IV SOLN
2.0000 g | Freq: Once | INTRAVENOUS | Status: AC
  Administered 2019-10-06: 2 g via INTRAVENOUS
  Filled 2019-10-06: qty 50

## 2019-10-06 MED ORDER — LACTATED RINGERS IV SOLN: INTRAVENOUS | Status: DC

## 2019-10-06 MED ORDER — VANCOMYCIN HCL IN DEXTROSE 1-5 GM/200ML-% IV SOLN
1000.0000 mg | Freq: Two times a day (BID) | INTRAVENOUS | Status: DC
  Administered 2019-10-06 – 2019-10-07 (×2): 1000 mg via INTRAVENOUS
  Filled 2019-10-06 (×3): qty 200

## 2019-10-06 MED ORDER — POTASSIUM CHLORIDE 20 MEQ PO PACK
40.0000 meq | PACK | Freq: Once | ORAL | Status: AC
  Administered 2019-10-06: 40 meq
  Filled 2019-10-06: qty 2

## 2019-10-06 MED ORDER — SODIUM CHLORIDE 0.9 % IV SOLN
250.0000 mL | INTRAVENOUS | Status: DC
  Administered 2019-10-07 – 2019-10-15 (×4): 250 mL via INTRAVENOUS

## 2019-10-06 MED ORDER — IPRATROPIUM-ALBUTEROL 0.5-2.5 (3) MG/3ML IN SOLN
3.0000 mL | RESPIRATORY_TRACT | Status: DC
  Administered 2019-10-06 – 2019-10-13 (×42): 3 mL via RESPIRATORY_TRACT
  Filled 2019-10-06 (×43): qty 3

## 2019-10-06 MED ORDER — CHLORHEXIDINE GLUCONATE CLOTH 2 % EX PADS: 6.0000 | MEDICATED_PAD | Freq: Every day | CUTANEOUS | Status: DC

## 2019-10-06 MED ORDER — MORPHINE SULFATE (PF) 2 MG/ML IV SOLN
2.0000 mg | INTRAVENOUS | Status: DC | PRN
  Administered 2019-10-10 – 2019-10-12 (×2): 2 mg via INTRAVENOUS
  Filled 2019-10-06 (×2): qty 1

## 2019-10-06 MED ORDER — FENTANYL CITRATE (PF) 100 MCG/2ML IJ SOLN
200.0000 ug | Freq: Once | INTRAMUSCULAR | Status: AC
  Administered 2019-10-06: 200 ug via INTRAVENOUS
  Filled 2019-10-06: qty 4

## 2019-10-06 MED ORDER — VECURONIUM BROMIDE 10 MG IV SOLR
10.0000 mg | INTRAVENOUS | Status: DC | PRN
  Administered 2019-10-07 – 2019-10-09 (×4): 10 mg via INTRAVENOUS
  Filled 2019-10-06 (×4): qty 10

## 2019-10-06 MED ORDER — VECURONIUM BROMIDE 10 MG IV SOLR
10.0000 mg | Freq: Once | INTRAVENOUS | Status: AC
  Administered 2019-10-06: 10 mg via INTRAVENOUS

## 2019-10-06 MED ORDER — GADOBUTROL 1 MMOL/ML IV SOLN
10.0000 mL | Freq: Once | INTRAVENOUS | Status: AC | PRN
  Administered 2019-10-06: 10 mL via INTRAVENOUS

## 2019-10-06 MED ORDER — SODIUM CHLORIDE 0.9 % IV SOLN: 2.0000 g | Freq: Three times a day (TID) | INTRAVENOUS | Status: DC

## 2019-10-06 MED ORDER — ENOXAPARIN SODIUM 40 MG/0.4ML ~~LOC~~ SOLN: 40.0000 mg | SUBCUTANEOUS | Status: DC

## 2019-10-06 MED ORDER — FENTANYL CITRATE (PF) 100 MCG/2ML IJ SOLN: 50.0000 ug | Freq: Once | INTRAMUSCULAR | Status: DC

## 2019-10-06 MED ORDER — INSULIN DETEMIR 100 UNIT/ML ~~LOC~~ SOLN
10.0000 [IU] | Freq: Two times a day (BID) | SUBCUTANEOUS | Status: DC
  Administered 2019-10-06 – 2019-10-07 (×2): 10 [IU] via SUBCUTANEOUS
  Filled 2019-10-06 (×4): qty 0.1

## 2019-10-06 MED ORDER — LORAZEPAM 2 MG/ML IJ SOLN
0.2500 mg | INTRAMUSCULAR | Status: AC | PRN
  Administered 2019-10-06: 0.25 mg via INTRAVENOUS

## 2019-10-06 MED ORDER — ONDANSETRON HCL 4 MG PO TABS: 4.0000 mg | ORAL_TABLET | Freq: Four times a day (QID) | ORAL | Status: DC | PRN

## 2019-10-06 MED ORDER — VANCOMYCIN HCL IN DEXTROSE 1-5 GM/200ML-% IV SOLN
1000.0000 mg | Freq: Two times a day (BID) | INTRAVENOUS | Status: DC
  Filled 2019-10-06: qty 200

## 2019-10-06 MED ORDER — SODIUM CHLORIDE 0.9 % IV SOLN
2.0000 g | INTRAVENOUS | Status: DC
  Administered 2019-10-06 (×3): 2 g via INTRAVENOUS
  Filled 2019-10-06 (×2): qty 2000
  Filled 2019-10-06: qty 2
  Filled 2019-10-06: qty 2000
  Filled 2019-10-06: qty 2
  Filled 2019-10-06: qty 2000

## 2019-10-06 MED ORDER — SENNOSIDES-DOCUSATE SODIUM 8.6-50 MG PO TABS
2.0000 | ORAL_TABLET | Freq: Two times a day (BID) | ORAL | Status: DC
  Administered 2019-10-06 – 2019-10-09 (×7): 2
  Filled 2019-10-06 (×7): qty 2

## 2019-10-06 MED ORDER — DIPHENHYDRAMINE HCL 50 MG/ML IJ SOLN
25.0000 mg | Freq: Once | INTRAMUSCULAR | Status: DC
  Filled 2019-10-06: qty 1

## 2019-10-06 MED ORDER — ONDANSETRON HCL 4 MG/2ML IJ SOLN: 4.0000 mg | Freq: Four times a day (QID) | INTRAMUSCULAR | Status: DC | PRN

## 2019-10-06 MED ORDER — DEXTROSE 5 % IV SOLN
600.0000 mg | Freq: Three times a day (TID) | INTRAVENOUS | Status: DC
  Administered 2019-10-06: 600 mg via INTRAVENOUS
  Filled 2019-10-06 (×2): qty 12

## 2019-10-06 MED ORDER — ORAL CARE MOUTH RINSE
15.0000 mL | OROMUCOSAL | Status: DC
  Administered 2019-10-06 – 2019-10-12 (×54): 15 mL via OROMUCOSAL

## 2019-10-06 MED ORDER — NOREPINEPHRINE 4 MG/250ML-% IV SOLN
2.0000 ug/min | INTRAVENOUS | Status: DC
  Filled 2019-10-06: qty 250

## 2019-10-06 MED ORDER — INSULIN ASPART 100 UNIT/ML ~~LOC~~ SOLN
3.0000 [IU] | SUBCUTANEOUS | Status: DC
  Administered 2019-10-06: 3 [IU] via SUBCUTANEOUS
  Administered 2019-10-06: 6 [IU] via SUBCUTANEOUS
  Administered 2019-10-07: 9 [IU] via SUBCUTANEOUS
  Filled 2019-10-06 (×3): qty 1

## 2019-10-06 MED ORDER — INSULIN GLARGINE 100 UNIT/ML ~~LOC~~ SOLN
15.0000 [IU] | Freq: Every day | SUBCUTANEOUS | Status: DC
  Administered 2019-10-06: 15 [IU] via SUBCUTANEOUS
  Filled 2019-10-06: qty 0.15

## 2019-10-06 MED ORDER — PRO-STAT SUGAR FREE PO LIQD
60.0000 mL | Freq: Two times a day (BID) | ORAL | Status: DC
  Administered 2019-10-06 – 2019-10-10 (×8): 60 mL

## 2019-10-06 MED ORDER — DEXAMETHASONE SODIUM PHOSPHATE 10 MG/ML IJ SOLN
10.0000 mg | Freq: Four times a day (QID) | INTRAMUSCULAR | Status: AC
  Administered 2019-10-06 – 2019-10-09 (×12): 10 mg via INTRAVENOUS
  Filled 2019-10-06 (×12): qty 1

## 2019-10-06 MED ORDER — SODIUM CHLORIDE 0.9 % IV BOLUS: 1000.0000 mL | Freq: Once | INTRAVENOUS | Status: DC

## 2019-10-06 MED ORDER — CHLORHEXIDINE GLUCONATE 0.12% ORAL RINSE (MEDLINE KIT)
15.0000 mL | Freq: Two times a day (BID) | OROMUCOSAL | Status: DC
  Administered 2019-10-06 – 2019-10-15 (×19): 15 mL via OROMUCOSAL

## 2019-10-06 MED ORDER — POTASSIUM PHOSPHATES 15 MMOLE/5ML IV SOLN
10.0000 mmol | Freq: Once | INTRAVENOUS | Status: AC
  Administered 2019-10-06: 10 mmol via INTRAVENOUS
  Filled 2019-10-06: qty 3.33

## 2019-10-06 MED ORDER — FAMOTIDINE IN NACL 20-0.9 MG/50ML-% IV SOLN
20.0000 mg | INTRAVENOUS | Status: DC
  Administered 2019-10-06: 20 mg via INTRAVENOUS
  Filled 2019-10-06: qty 50

## 2019-10-06 MED ORDER — BUDESONIDE 0.5 MG/2ML IN SUSP
0.5000 mg | Freq: Two times a day (BID) | RESPIRATORY_TRACT | Status: DC
  Administered 2019-10-06 – 2019-10-16 (×21): 0.5 mg via RESPIRATORY_TRACT
  Filled 2019-10-06 (×21): qty 2

## 2019-10-06 MED ORDER — FENTANYL CITRATE (PF) 100 MCG/2ML IJ SOLN
100.0000 ug | Freq: Once | INTRAMUSCULAR | Status: AC
  Administered 2019-10-06: 100 ug via INTRAVENOUS
  Filled 2019-10-06: qty 2

## 2019-10-06 MED ORDER — FAMOTIDINE IN NACL 20-0.9 MG/50ML-% IV SOLN
20.0000 mg | Freq: Two times a day (BID) | INTRAVENOUS | Status: DC
  Administered 2019-10-06 – 2019-10-07 (×2): 20 mg via INTRAVENOUS
  Filled 2019-10-06 (×2): qty 50

## 2019-10-06 MED ORDER — VITAL HIGH PROTEIN PO LIQD
1000.0000 mL | ORAL | Status: DC
  Administered 2019-10-06 – 2019-10-09 (×4): 1000 mL

## 2019-10-06 MED ORDER — METRONIDAZOLE IN NACL 5-0.79 MG/ML-% IV SOLN
500.0000 mg | Freq: Three times a day (TID) | INTRAVENOUS | Status: DC
  Administered 2019-10-06: 500 mg via INTRAVENOUS
  Filled 2019-10-06: qty 100

## 2019-10-06 MED ORDER — VECURONIUM BROMIDE 10 MG IV SOLR
10.0000 mg | Freq: Once | INTRAVENOUS | Status: AC
  Administered 2019-10-06: 10 mg via INTRAVENOUS
  Filled 2019-10-06: qty 10

## 2019-10-06 MED ORDER — ETOMIDATE 2 MG/ML IV SOLN
20.0000 mg | Freq: Once | INTRAVENOUS | Status: AC
  Administered 2019-10-06: 20 mg via INTRAVENOUS
  Filled 2019-10-06: qty 10

## 2019-10-06 MED ORDER — LORAZEPAM 2 MG/ML IJ SOLN
0.5000 mg | INTRAMUSCULAR | Status: DC | PRN
  Filled 2019-10-06: qty 1

## 2019-10-06 MED ORDER — PROPOFOL 1000 MG/100ML IV EMUL
5.0000 ug/kg/min | INTRAVENOUS | Status: DC
  Administered 2019-10-06: 50 ug/kg/min via INTRAVENOUS
  Administered 2019-10-06: 45 ug/kg/min via INTRAVENOUS
  Administered 2019-10-06: 60 ug/kg/min via INTRAVENOUS
  Administered 2019-10-06: 50 ug/kg/min via INTRAVENOUS
  Administered 2019-10-06: 60 ug/kg/min via INTRAVENOUS
  Administered 2019-10-06: 40 ug/kg/min via INTRAVENOUS
  Administered 2019-10-07: 50 ug/kg/min via INTRAVENOUS
  Administered 2019-10-07 (×5): 60 ug/kg/min via INTRAVENOUS
  Administered 2019-10-07: 50 ug/kg/min via INTRAVENOUS
  Administered 2019-10-07: 60 ug/kg/min via INTRAVENOUS
  Administered 2019-10-08: 70 ug/kg/min via INTRAVENOUS
  Administered 2019-10-08: 60 ug/kg/min via INTRAVENOUS
  Administered 2019-10-08: 65 ug/kg/min via INTRAVENOUS
  Administered 2019-10-08 (×2): 70 ug/kg/min via INTRAVENOUS
  Administered 2019-10-08: 65 ug/kg/min via INTRAVENOUS
  Administered 2019-10-08: 64.98 ug/kg/min via INTRAVENOUS
  Administered 2019-10-08 – 2019-10-09 (×3): 70 ug/kg/min via INTRAVENOUS
  Administered 2019-10-09: 64.98 ug/kg/min via INTRAVENOUS
  Administered 2019-10-09: 49.934 ug/kg/min via INTRAVENOUS
  Administered 2019-10-09 (×2): 65 ug/kg/min via INTRAVENOUS
  Administered 2019-10-09: 60 ug/kg/min via INTRAVENOUS
  Administered 2019-10-09: 80.026 ug/kg/min via INTRAVENOUS
  Administered 2019-10-09 (×2): 60 ug/kg/min via INTRAVENOUS
  Administered 2019-10-09: 65 ug/kg/min via INTRAVENOUS
  Administered 2019-10-10: 60 ug/kg/min via INTRAVENOUS
  Administered 2019-10-10: 40 ug/kg/min via INTRAVENOUS
  Administered 2019-10-10: 60 ug/kg/min via INTRAVENOUS
  Filled 2019-10-06 (×36): qty 100

## 2019-10-06 NOTE — Progress Notes (Signed)
I have tried numerous times to connect daughter as well as calling daughter to help in any way, no response from daughter, bedside RN aware. Number was confirmed

## 2019-10-06 NOTE — ED Notes (Signed)
Attempted to call report at this time- unit states they don't have RN assigned. Agricultural consultant notified.

## 2019-10-06 NOTE — H&P (Signed)
History and Physical    Cathy Hughes [id number] DOB: 1961/05/17 DOA: 10/05/2019  PCP: Donnie Coffin, MD   Patient coming from: home  I have personally briefly reviewed patient's old medical records in [place]  Chief Complaint: ear pain, altered mental status  HPI: Cathy Hughes is a 59 y.o. female with medical history significant for prediabetes, hypertension, COPD, nicotine dependence, perennial rhinitis, per review of records who initially presented to the ER early on 10/05/2019 with a complaint of severe left ear pain.  She was treated and discharged on oxycodone and prednisone.  She returned to the emergency room later on in the day complaining of headache that started soon after taking the oxycodone.  While being reevaluated in the emergency room, she developed altered mental status, became very lethargic and difficult to arouse and appeared tachypneic.  Subsequently became very agitated. most of the history is taken from ER records due to altered mental status, mostly restless and agitated, unable to answer questions.  ED Course: On evaluation in the emergency room she was afebrile at 97.7 with a blood pressure of 168/79 heart rate 89 O2 sat 96% on room air.  Blood work was done at this time which revealed a white cell count of 19,000 and a lactic acid of 3.7.  Blood sugar was 349.  Gap of 14.  Chest x-ray showed no acute findings.  Head CT showed no acute findings.  Flu test was negative. Patient was started on sepsis protocol for sepsis of unknown source and hospitalist consulted for admission.  Urinalysis pending on admission.   Review of Systems: Unable to obtain due to agitation  Past Medical History:  Diagnosis Date  . Asthma   . COPD (chronic obstructive pulmonary disease) ([place])   . Hypertension   . Pre-diabetes     Past Surgical History:  Procedure Laterality Date  . CHOLECYSTECTOMY    . COLON SURGERY    . thumb surgery       reports that she has been  smoking cigarettes. She has a 17.50 pack-year smoking history. She has never used smokeless tobacco. She reports current alcohol use. She reports that she does not use drugs.  Allergies  Allergen Reactions  . Aspirin Hives  . Tramadol Other (See Comments)    Headache   . Tylenol [Acetaminophen] Hives    Family History  Problem Relation Age of Onset  . Diabetes Mother   . Hypertension Mother   . Diabetes Father   . Hypertension Father      Prior to Admission medications   Medication Sig Start Date End Date Taking? Authorizing Provider  albuterol (PROVENTIL HFA;VENTOLIN HFA) 108 (90 Base) MCG/ACT inhaler Inhale 2-4 puffs by mouth every 4 hours as needed for wheezing, cough, and/or shortness of breath 07/31/18   Hubbard Hartshorn, FNP  amLODipine (NORVASC) 5 MG tablet Take 1 tablet (5 mg total) by mouth daily. 05/14/19   Hubbard Hartshorn, FNP  fluticasone (FLONASE) 50 MCG/ACT nasal spray Place 2 sprays into both nostrils daily. 10/05/19 10/04/20  Johnn Hai, PA-C  ipratropium-albuterol (DUONEB) 0.5-2.5 (3) MG/3ML SOLN Take 3 mLs by nebulization every 6 (six) hours as needed. 07/31/18   Hubbard Hartshorn, FNP  montelukast (SINGULAIR) 10 MG tablet Take 1 tablet (10 mg total) by mouth daily. 08/05/18 08/05/19  Tyler Pita, MD  omeprazole (PRILOSEC) 20 MG capsule Take 1 capsule (20 mg total) by mouth 2 (two) times daily. 09/02/18 12/01/18  Tyler Pita,  MD  oxyCODONE (OXY IR/ROXICODONE) 5 MG immediate release tablet Take 1 tablet (5 mg total) by mouth every 6 (six) hours as needed for severe pain. 10/05/19   Johnn Hai, PA-C  predniSONE (DELTASONE) 10 MG tablet Take 6 tablets  today, on day 2 take 5 tablets, day 3 take 4 tablets, day 4 take 3 tablets, day 5 take  2 tablets and 1 tablet the last day 10/05/19   Johnn Hai, PA-C  tiZANidine (ZANAFLEX) 4 MG tablet Take 0.5-1 tablets (2-4 mg total) by mouth every 6 (six) hours as needed for muscle spasms. 08/19/18   Hubbard Hartshorn, FNP  TRELEGY ELLIPTA 100-62.5-25 MCG/INH AEPB INHALE 1 PUFF INTO THE LUNGS DAILY 12/13/18   Hubbard Hartshorn, FNP    Physical Exam: Vitals:   10/05/19 2037 10/05/19 2233 10/05/19 2243 10/05/19 2244  BP:  (!) 177/77    Pulse:  91 (!) 105   Resp:    19  Temp:      TempSrc:      SpO2:  94% 96% (!) 2%  Height: 5\' 5"  (1.651 m)        Vitals:   10/05/19 2037 10/05/19 2233 10/05/19 2243 10/05/19 2244  BP:  (!) 177/77    Pulse:  91 (!) 105   Resp:    19  Temp:      TempSrc:      SpO2:  94% 96% (!) 2%  Height: 5\' 5"  (1.651 m)       Constitutional: Patient very restless, able to answer questions asked Eyes: PERRL, lids and conjunctivae normal ENMT: Mucous membranes are moist.  Neck: normal, supple, no masses, no thyromegaly Respiratory: clear to auscultation bilaterally, no wheezing, no crackles. Normal respiratory effort. No accessory muscle use.  Cardiovascular: Regular rate and rhythm, no murmurs / rubs / gallops. No extremity edema. 2+ pedal pulses. No carotid bruits.  Abdomen: no tenderness, no masses palpated. No hepatosplenomegaly. Bowel sounds positive.  Musculoskeletal: no clubbing / cyanosis. No joint deformity upper and lower extremities.  Skin: no rashes, lesions, ulcers.  Neurologic: No gross focal neurologic deficit.  Moving all 4 extremities, restless psychiatric: agitated and restless   Labs on Admission: I have personally reviewed following labs and imaging studies  CBC: Recent Labs  Lab 10/05/19 2237  WBC 19.0*  HGB 13.5  HCT 41.0  MCV 84.4  PLT 99991111*   Basic Metabolic Panel: Recent Labs  Lab 10/05/19 2237  NA 138  K 4.3  CL 104  CO2 20*  GLUCOSE 349*  BUN 9  CREATININE 0.77  CALCIUM 9.3   GFR: Estimated Creatinine Clearance: 87.4 mL/min (by C-G formula based on SCr of 0.77 mg/dL). Liver Function Tests: Recent Labs  Lab 10/05/19 2237  AST 21  ALT 21  ALKPHOS 80  BILITOT 0.4  PROT 8.0  ALBUMIN 3.8   No results for input(s): LIPASE,  AMYLASE in the last 168 hours. No results for input(s): AMMONIA in the last 168 hours. Coagulation Profile: No results for input(s): INR, PROTIME in the last 168 hours. Cardiac Enzymes: No results for input(s): CKTOTAL, CKMB, CKMBINDEX, TROPONINI in the last 168 hours. BNP (last 3 results) No results for input(s): PROBNP in the last 8760 hours. HbA1C: No results for input(s): HGBA1C in the last 72 hours. CBG: Recent Labs  Lab 10/05/19 2221  GLUCAP 339*   Lipid Profile: No results for input(s): CHOL, HDL, LDLCALC, TRIG, CHOLHDL, LDLDIRECT in the last 72 hours. Thyroid Function Tests: No  results for input(s): TSH, T4TOTAL, FREET4, T3FREE, THYROIDAB in the last 72 hours. Anemia Panel: No results for input(s): VITAMINB12, FOLATE, FERRITIN, TIBC, IRON, RETICCTPCT in the last 72 hours. Urine analysis:    Component Value Date/Time   COLORURINE YELLOW 05/06/2013 1300   APPEARANCEUR CLEAR 05/06/2013 1300   LABSPEC 1.012 05/06/2013 1300   PHURINE 6.5 05/06/2013 1300   GLUCOSEU NEGATIVE 05/06/2013 1300   HGBUR LARGE (A) 05/06/2013 1300   BILIRUBINUR NEGATIVE 05/06/2013 1300   KETONESUR NEGATIVE 05/06/2013 1300   PROTEINUR NEGATIVE 05/06/2013 1300   UROBILINOGEN 0.2 05/06/2013 1300   NITRITE NEGATIVE 05/06/2013 1300   LEUKOCYTESUR NEGATIVE 05/06/2013 1300    Radiological Exams on Admission: CT Head Wo Contrast  Result Date: 10/05/2019 CLINICAL DATA:  Left ear pain for 1 month, decrease D her Ng EXAM: CT HEAD WITHOUT CONTRAST TECHNIQUE: Contiguous axial images were obtained from the base of the skull through the vertex without intravenous contrast. COMPARISON:  None. FINDINGS: Brain: No evidence of acute infarction, hemorrhage, hydrocephalus, extra-axial collection or mass lesion/mass effect. Cavum septum pellucidum is incidentally noted. Vascular: Atherosclerotic calcification of the carotid siphons. No hyperdense vessel. Skull: This No calvarial fracture or suspicious osseous lesion.  No scalp swelling or hematoma. Sinuses/Orbits: Paranasal sinuses and mastoid air cells are predominantly clear. Middle ear cavities are clear. Ossicular chains are grossly normally configured on these thick slice images. External auditory canals are clear. Slightly proptotic appearance of the globes. Included orbital structures are otherwise unremarkable without abnormal thickening of the extraocular musculature. Other: None IMPRESSION: No acute intracranial findings. Electronically Signed   By: Lovena Le M.D.   On: 10/05/2019 23:24   DG Chest Port 1 View  Result Date: 10/05/2019 CLINICAL DATA:  Pain EXAM: PORTABLE CHEST 1 VIEW COMPARISON:  Chest radiograph 06/04/2018 FINDINGS: Heart size within normal limits. The patient is rotated to the right. Overlapping soft tissue precludes adequate evaluation of the left lung. Mild elevation of the right hemidiaphragm. The right lung is clear. No evidence of pleural effusion or pneumothorax. No acute bony abnormality. IMPRESSION: Overlapping soft tissue precludes adequate evaluation of the left lung. The right lung is clear. Mild elevation of the right hemidiaphragm. Electronically Signed   By: Kellie Simmering DO   On: 10/05/2019 23:07    EKG: Independently reviewed.   Assessment/Plan Principal Problem:   Sepsis ([place]) of unknown source, suspect meningitis -Patient presents with headache and altered mental status following an initial complaint of right ear pain, white cell count of 19,000 and lactic acid 3.7 -Continue broad-spectrum antibiotics with cefepime, Flagyl and vancomycin for sepsis of unknown source -Continue IV fluid boluses to continue thereafter with normal saline at 196ml/hr -Complaint of ear pain, possible ENT infection -IR consult for LP under fluoroscopic guidance to evaluate for meningitis; spoke with Dr. Ardeen Garland radiologist at [facility] radiology.  Advised to arrange with incoming radiologist -No evidence of pneumonia on chest  x-ray -Follow-up urinalysis consider abdominal imaging for sepsis of unknown source Follow blood culture    Acute toxic- metabolic encephalopathy -Likely related to sepsis, uncertain whether related to medication prescribed earlier of prednisone and oxycodone, given onset of altered mental status after taking the medication CT head showed no acute findings Patient receiving Ativan 0.25x2 to assist with agitation  [facility] radiology consulted for LP under fluoroscopic guidance to evaluate for possible meningitis.  N.p.o. until more alert Fall and aspiration precautions Follow urine drug screen   Hyperglycemia due to type 2 diabetes mellitus ([place]) Blood sugar was 349, likely  in part due to diabetes and prednisone Patient has a history of prediabetes was on no diabetic medication  insulin coverage every 4 Follow hemoglobin A1c    Left ear pain -Patient was initially treated for eustachian tube dysfunction from the ER with prednisone and oxycodone -We will avoid narcotic pain medication for now -Toradol as needed until more alert    HTN (hypertension) -Blood pressure was elevated at 168/79 IV as needed antihypertensives until more awake and alert    COPD (chronic obstructive pulmonary disease) ([place]) -Not acutely exacerbated.  Continue home meds   perennial allergic rhinitis Continue Singulair    Tobacco abuse For tobacco cessation counseling      DVT prophylaxis: lovenox  Code Status: full code  Family Communication: none  Disposition Plan: Back to previous home environment Consults called: Dr. Ardeen Garland, [facility] radiology     Athena Masse MD [facility]     10/06/2019, 12:23 AM

## 2019-10-06 NOTE — Progress Notes (Signed)
Inpatient Diabetes Program Recommendations  AACE/ADA: New Consensus Statement on Inpatient Glycemic Control (2015)  Target Ranges:  Prepandial:   less than 140 mg/dL      Peak postprandial:   less than 180 mg/dL (1-2 hours)      Critically ill patients:  140 - 180 mg/dL   Lab Results  Component Value Date   GLUCAP 290 (H) 10/06/2019   HGBA1C 8.2 (H) 10/06/2019    Review of Glycemic Control  Results for LEIGHA, MCCARY (MRN [id number]) as of 10/06/2019 10:14  Ref. Range 10/05/2019 22:21 10/06/2019 02:22 10/06/2019 03:24 10/06/2019 03:57 10/06/2019 07:34  Glucose-Capillary Latest Ref Range: 70 - 99 mg/dL 339 (H) 321 (H) 343 (H) 353 (H) 290 (H)    Diabetes history: PreDM-Now DM2 Outpatient Diabetes medications: None Current orders for Inpatient glycemic control:Lantus 15 units QD + Novolog 0-20 Q4H + Decadron 10 mg Q6H  Inpatient Diabetes Program Recommendations:     -If patient is intubated please consider ICU protocol and IV insulin -If MD does not want to start IV insulin at this time please consider  Levemir 10 mg BID + Novolog 0-20 Q4H   Thank you, Reche Dixon, RN, BSN Diabetes Coordinator Inpatient Diabetes Program [phone] (team pager from 8a-5p)

## 2019-10-06 NOTE — ED Notes (Signed)
Requested sedation of MD Onalee Hua- MD to come and assess. Attempted to cath- unble to due to position.

## 2019-10-06 NOTE — Procedures (Addendum)
Endotracheal Intubation Procedure Note  Indication for endotracheal intubation: respiratory failure. Airway Assessment: Mallampati Class: II (hard and soft palate, upper portion of tonsils anduvula visible). Sedation: etomidate and fentanyl. Paralytic: rocuronium. Lidocaine: no. Atropine: no. Equipment: glidescope utilized . Cricoid Pressure: no. Number of attempts: 1. ETT location confirmed by by auscultation, by CXR and ETCO2 monitor.  Pt tolerated mechanical intubation.   Marda Stalker, [place] Pager [phone] (please enter 7 digits) PCCM Consult Pager [phone] (please enter 7 digits)

## 2019-10-06 NOTE — Plan of Care (Signed)
Pt arrived from ED moaning and non purposeful movements, ICU NP bedside for admission. Pt became obtunded and was intubated. Placed on Prop and fent gtt for vent synchrony. PRN's given to manage acute HTN. Pt unable to follow commands, Pain assessed using CPOT. In NSR/ST, low grade fever. ICU team aware. CT was done, see results, plan for MRI of head and neck today. Possible LP, so lovenox held. Foley cath in place, adequate output, see I&O's. 2 RN skin assessment completed upon admission. Family updated by ICU NP, no other issues noted at this time.

## 2019-10-06 NOTE — ED Notes (Signed)
Pt yelling, please help me- will not lay flat or describe what is wrong. ED MD notified.

## 2019-10-06 NOTE — Progress Notes (Signed)
Pt back in room after MRI at this time. VSS.

## 2019-10-06 NOTE — Progress Notes (Signed)
Pharmacy Antibiotic Note  Cathy Hughes is a 59 y.o. female admitted on 10/05/2019 with sepsis., headache  Pharmacy has been consulted for Vancomycin dosing.  Possible meningitis.  Pt received loading dose of Vanc 2gm.  Plan: Vancomycin 1gm IV q12hrs Check trough level at steady state  Height: 5\' 5"  (165.1 cm) Weight: 222 lb 3.6 oz (100.8 kg) IBW/kg (Calculated) : 57  Temp (24hrs), Avg:99.5 F (37.5 C), Min:97.5 F (36.4 C), Max:102.8 F (39.3 C)  Recent Labs  Lab 10/05/19 2237 10/06/19 0013  WBC 19.0*  --   CREATININE 0.77  --   LATICACIDVEN 3.7* 4.3*    Estimated Creatinine Clearance: 92.3 mL/min (by C-G formula based on SCr of 0.77 mg/dL).    Allergies  Allergen Reactions  . Aspirin Hives  . Tramadol Other (See Comments)    Headache   . Tylenol [Acetaminophen] Hives    Antimicrobials this admission: Vancomycin 1/25 >>  Cefepime 1/25 >>  Ampicillin 1/25 >> Flagyl 1/25 x 1  Dose adjustments this admission:   Microbiology results:  BCx:   UCx:    Sputum:    MRSA PCR:   Thank you for allowing pharmacy to be a part of this patient's care.  Hart Robinsons A 10/06/2019 4:35 AM

## 2019-10-06 NOTE — Consult Note (Signed)
Name: Cathy Hughes MRN: [id number] DOB: 06/28/1961    ADMISSION DATE:  10/05/2019 CONSULTATION DATE: 09/04/2020  REFERRING MD : Rufina Falco, NP   CHIEF COMPLAINT: Headache   BRIEF PATIENT DESCRIPTION:  59 yo female admitted with acute respiratory failure secondary to metabolic acidosis, acute toxic metabolic encephalopathy, and sepsis etiology unclear although possibly secondary to meningitis requiring mechanical intubation   SIGNIFICANT EVENTS/STUDIES:  01/24: Pt arrived to the ER with c/o headache after taking oxycodone requiring admission to the stepdown unit  01/24: CT Head revealed no acute intracranial findings 01/25: Pt became minimally responsive after receiving low dose morphine and ativan, therefore PCCM consulted to assist with treatment plan.  Pt subsequently required mechanical intubation due to acute respiratory failure   MICRO DATA: Rapid COVID-19 01/24>>negative  Blood x2 01/25>> Urine 01/25>> COVID-19 01/24>>  ANTIMICROBIALS: Cefepime x1 dose 01/24 Metronidazole x1 dose 01/24 Ceftriaxone 01/25>> Ampicillin 01/25>> Vancomycin 01/25>>  HISTORY OF PRESENT ILLNESS:   This is a 59 yo female with a PMH of Pre-Diabetes, HTN, COPD, and Asthma.  She initially presented to the ER the morning of 01/24 with c/o 10/10 left ear pain with difficulty hearing, and per ER notes she stated she felt like her ear was "clogged up" onset of symptoms 1 month prior to presentation. During initial ER visit she did not require hospital admission, and was diagnosed with acute left eustachian tube dysfunction.  She was discharged from the ER and instructed to take prednisone, flonase, oxycodone, and to follow-up with [facility]. However, the evening of 01/24 she came back to the ER with c/o headache after she took the prescribed oxycodone.  Per ER notes pt also intermittently confused and lethargic with labored respirations.  Lab results revealed glucose 349, CO2 20, lactic acid 3.7,  wbc 19.0, troponin 4, and UA negative for UTI.  CXR and rapid COVID-19 negative. CT Head negative for acute abnormality.  Pt febrile temp 102.8 F and tachycardic hr low 100's.  Therefore, sepsis protocol initiated pt received vancomycin, flagyl, cefepime, and 2.5L fluid bolus.  She was subsequently admitted to the stepdown unit per hospitalist team for additional workup and treatment.  However, the morning of 01/25 she became restless/agitated requiring a total of 0.5 mg of iv ativan and 1 mg iv morphine.  Following medication administration she became minimally responsive with foaming at the mouth.  Due to decline in pt condition PCCM team consulted to assist with treatment plan.    PAST MEDICAL HISTORY :   has a past medical history of Asthma, COPD (chronic obstructive pulmonary disease) ([place]), Hypertension, and Pre-diabetes.  has a past surgical history that includes Colon surgery; Cholecystectomy; and thumb surgery. Prior to Admission medications   Medication Sig Start Date End Date Taking? Authorizing Provider  albuterol (PROVENTIL HFA;VENTOLIN HFA) 108 (90 Base) MCG/ACT inhaler Inhale 2-4 puffs by mouth every 4 hours as needed for wheezing, cough, and/or shortness of breath 07/31/18   Hubbard Hartshorn, FNP  amLODipine (NORVASC) 5 MG tablet Take 1 tablet (5 mg total) by mouth daily. 05/14/19   Hubbard Hartshorn, FNP  fluticasone (FLONASE) 50 MCG/ACT nasal spray Place 2 sprays into both nostrils daily. 10/05/19 10/04/20  Johnn Hai, PA-C  ipratropium-albuterol (DUONEB) 0.5-2.5 (3) MG/3ML SOLN Take 3 mLs by nebulization every 6 (six) hours as needed. 07/31/18   Hubbard Hartshorn, FNP  montelukast (SINGULAIR) 10 MG tablet Take 1 tablet (10 mg total) by mouth daily. 08/05/18 08/05/19  Tyler Pita, MD  omeprazole (PRILOSEC) 20 MG capsule Take 1 capsule (20 mg total) by mouth 2 (two) times daily. 09/02/18 12/01/18  Tyler Pita, MD  oxyCODONE (OXY IR/ROXICODONE) 5 MG immediate release tablet Take  1 tablet (5 mg total) by mouth every 6 (six) hours as needed for severe pain. 10/05/19   Johnn Hai, PA-C  predniSONE (DELTASONE) 10 MG tablet Take 6 tablets  today, on day 2 take 5 tablets, day 3 take 4 tablets, day 4 take 3 tablets, day 5 take  2 tablets and 1 tablet the last day 10/05/19   Johnn Hai, PA-C  tiZANidine (ZANAFLEX) 4 MG tablet Take 0.5-1 tablets (2-4 mg total) by mouth every 6 (six) hours as needed for muscle spasms. 08/19/18   Hubbard Hartshorn, FNP  TRELEGY ELLIPTA 100-62.5-25 MCG/INH AEPB INHALE 1 PUFF INTO THE LUNGS DAILY 12/13/18   Hubbard Hartshorn, FNP   Allergies  Allergen Reactions  . Aspirin Hives  . Tramadol Other (See Comments)    Headache   . Tylenol [Acetaminophen] Hives    FAMILY HISTORY:  family history includes Diabetes in her father and mother; Hypertension in her father and mother. SOCIAL HISTORY:  reports that she has been smoking cigarettes. She has a 17.50 pack-year smoking history. She has never used smokeless tobacco. She reports current alcohol use. She reports that she does not use drugs.  REVIEW OF SYSTEMS:   Unable to assess pt confused/agitated    SUBJECTIVE:  Unable to assess pt confused/agitated  VITAL SIGNS: Temp:  [97.5 F (36.4 C)-102.8 F (39.3 C)] 102.8 F (39.3 C) (01/25 0111) Pulse Rate:  [84-106] 91 (01/25 0230) Resp:  [18-27] 23 (01/25 0230) BP: (152-192)/(77-93) 175/93 (01/25 0230) SpO2:  [2 %-98 %] 98 % (01/25 0230) Weight:  [90.7 kg] 90.7 kg (01/24 0850)  PHYSICAL EXAMINATION: General: acutely ill appearing female, agitated/restless  Neuro: agitated, not following commands, moves all extremities, PERRL  HEENT: supple, no JVD  Cardiovascular: sinus tachy, no R/G  Lungs: diminished throughout, slightly labored/tachypneic  Abdomen: hypoactive BS x4, soft, obese, non distended  Musculoskeletal: normal bulk and tone, no edema  Skin: intact no rashes or lesions present   Recent Labs  Lab 10/05/19 2237  NA 138   K 4.3  CL 104  CO2 20*  BUN 9  CREATININE 0.77  GLUCOSE 349*   Recent Labs  Lab 10/05/19 2237  HGB 13.5  HCT 41.0  WBC 19.0*  PLT 438*   CT Head Wo Contrast  Result Date: 10/05/2019 CLINICAL DATA:  Left ear pain for 1 month, decrease D her Ng EXAM: CT HEAD WITHOUT CONTRAST TECHNIQUE: Contiguous axial images were obtained from the base of the skull through the vertex without intravenous contrast. COMPARISON:  None. FINDINGS: Brain: No evidence of acute infarction, hemorrhage, hydrocephalus, extra-axial collection or mass lesion/mass effect. Cavum septum pellucidum is incidentally noted. Vascular: Atherosclerotic calcification of the carotid siphons. No hyperdense vessel. Skull: This No calvarial fracture or suspicious osseous lesion. No scalp swelling or hematoma. Sinuses/Orbits: Paranasal sinuses and mastoid air cells are predominantly clear. Middle ear cavities are clear. Ossicular chains are grossly normally configured on these thick slice images. External auditory canals are clear. Slightly proptotic appearance of the globes. Included orbital structures are otherwise unremarkable without abnormal thickening of the extraocular musculature. Other: None IMPRESSION: No acute intracranial findings. Electronically Signed   By: Lovena Le M.D.   On: 10/05/2019 23:24   DG Chest Port 1 View  Result Date: 10/05/2019 CLINICAL DATA:  Pain EXAM: PORTABLE CHEST 1 VIEW COMPARISON:  Chest radiograph 06/04/2018 FINDINGS: Heart size within normal limits. The patient is rotated to the right. Overlapping soft tissue precludes adequate evaluation of the left lung. Mild elevation of the right hemidiaphragm. The right lung is clear. No evidence of pleural effusion or pneumothorax. No acute bony abnormality. IMPRESSION: Overlapping soft tissue precludes adequate evaluation of the left lung. The right lung is clear. Mild elevation of the right hemidiaphragm. Electronically Signed   By: Kellie Simmering DO   On:  10/05/2019 23:07    ASSESSMENT / PLAN:  Acute respiratory failure secondary to compensated metabolic acidosis  Mechanical intubation  Hx: COPD and Asthma Full vent support for now-vent settings reviewed and established  SBT once all parameters met  VAP bundle implemented  Prn bronchodilator therapy   Hypokalemia and hypomagnesia  Trend BMP  Replace electrolytes as indicated  Monitor UOP  Uncontrolled Hypertension  Continuous telemetry monitoring  Prn hydralazine for bp management   Sepsis etiology unclear although possibly secondary to meningitis  Possible left eusta Lactic acidosis  Trend WBC and monitor fever curve  Trend PCT and lactic acid  Follow cultures  LP pending  Influenza PCR pending  Empiric meningitistreatement-vancomycin, ceftriaxone, ampicillin, and decadron  Maintain map >65  Hyperglycemia  CBG's q4hrs SSI and scheduled lantus; if pt remains hyperglycemic will start insulin gtt  Diabetes coordinator consulted appreciated input   Acute toxic metabolic encephalopathy likely secondary to infectious process (CT Head 10/05/2019 negative for acute intracranial process) Mechanical intubation/pain Maintain RASS goal -1 to -2  Propofol and fentanyl gtts to maintain RASS goal and for pain management  WUA daily  MRI Brain Neck and EEG pending  Salicylate level, Urine drug screen, and alcohol levels pending  Neurology consulted appreciate input   Best Practice: VTE px: SCD's for now, will start subq lovenox following LP SUP px: IV famotidine  Diet: NPO for now if pt remains intubated in the next 24hrs will start TF's   -Updated pts husband Cathy Hughes via telephone regarding decline in pt condition requiring mechanical intubation and current plan of care. All questions were answered.  Case discussed with ICU Intensivist Dr. Lanney Gins via telephone he agreed with the plan of care as outline above.  Marda Stalker, [place] Pager  [phone] (please enter 7 digits) PCCM Consult Pager [phone] (please enter 7 digits)

## 2019-10-06 NOTE — Progress Notes (Signed)
Updated pts husband Nan Rumberger and her daughter Curlene Labrum via telephone regarding pts condition and current plan of care. All questions were answered will continue to monitor and assess pt.  Marda Stalker, [place] Pager [phone] (please enter 7 digits) PCCM Consult Pager [phone] (please enter 7 digits)

## 2019-10-06 NOTE — Progress Notes (Signed)
eeg completed ° °

## 2019-10-06 NOTE — Progress Notes (Signed)
Pt off unit at this time for MRI. 

## 2019-10-06 NOTE — Consult Note (Signed)
NAME: Cathy Hughes  DOB: 11-Sep-1961  MRN: [id number]  Date/Time: 10/06/2019 7:24 PM  REQUESTING PROVIDER: Temple Pacini Subjective:  REASON FOR CONSULT: pneumococcus bacteremia and meninigits ? No history from patient - chart reviewed - spoke to ICU NP NYJAI GRAFF is a 59 y.o. female with a history of COPD/HTN presented to the ED on 10/05/19 at 8.40 am with left ear pain  And felling like the ear was stopped up. Vitals were BP of 152/86, HR 97, Temp 97.5 She was discharged on prednisone and pain meds. She returned at 9 pm c/o severe headache and became somnolent in the ED and altered. CBC was 19, CT head was done, lactate was 3.7. Blood culture sent an she was started on vanco and cefepime. Temp later was 102.8 She was admitted to ICU and later intubated around 5 am  Seen by neurologist who wanted LP. Blood culture came positive for Strep pneumo I am asked to see the patient for the same   Past Medical History:  Diagnosis Date  . Asthma   . COPD (chronic obstructive pulmonary disease) ([place])   . Hypertension   . Pre-diabetes     Past Surgical History:  Procedure Laterality Date  . CHOLECYSTECTOMY    . COLON SURGERY    . thumb surgery      Social History   Socioeconomic History  . Marital status: Married    Spouse name: Sturgis  . Number of children: 3  . Years of education: Not on file  . Highest education level: Not on file  Occupational History  . Not on file  Tobacco Use  . Smoking status: Current Some Day Smoker    Packs/day: 0.50    Years: 35.00    Pack years: 17.50    Types: Cigarettes  . Smokeless tobacco: Never Used  Substance and Sexual Activity  . Alcohol use: Yes    Comment: occ  . Drug use: No  . Sexual activity: Yes    Partners: Male  Other Topics Concern  . Not on file  Social History Narrative  . Not on file   Social Determinants of Health   Financial Resource Strain:   . Difficulty of Paying Living Expenses: Not on file  Food Insecurity:   .  Worried About Charity fundraiser in the Last Year: Not on file  . Ran Out of Food in the Last Year: Not on file  Transportation Needs:   . Lack of Transportation (Medical): Not on file  . Lack of Transportation (Non-Medical): Not on file  Physical Activity:   . Days of Exercise per Week: Not on file  . Minutes of Exercise per Session: Not on file  Stress:   . Feeling of Stress : Not on file  Social Connections:   . Frequency of Communication with Friends and Family: Not on file  . Frequency of Social Gatherings with Friends and Family: Not on file  . Attends Religious Services: Not on file  . Active Member of Clubs or Organizations: Not on file  . Attends Archivist Meetings: Not on file  . Marital Status: Not on file  Intimate Partner Violence:   . Fear of Current or Ex-Partner: Not on file  . Emotionally Abused: Not on file  . Physically Abused: Not on file  . Sexually Abused: Not on file    Family History  Problem Relation Age of Onset  . Diabetes Mother   . Hypertension Mother   . Diabetes Father   .  Hypertension Father    Allergies  Allergen Reactions  . Aspirin Hives  . Tramadol Other (See Comments)    Headache   . Tylenol [Acetaminophen] Hives    ? Current Facility-Administered Medications  Medication Dose Route Frequency Provider Last Rate Last Admin  . acetaminophen (TYLENOL) suppository 650 mg  650 mg Rectal Q6H PRN Athena Masse, MD   650 mg at 10/06/19 0136  . budesonide (PULMICORT) nebulizer solution 0.5 mg  0.5 mg Nebulization BID Flora Lipps, MD   0.5 mg at 10/06/19 0956  . cefTRIAXone (ROCEPHIN) 2 g in sodium chloride 0.9 % 100 mL IVPB  2 g Intravenous Q12H Awilda Bill, NP   Stopped at 10/06/19 [phone]  . chlorhexidine gluconate (MEDLINE KIT) (PERIDEX) 0.12 % solution 15 mL  15 mL Mouth Rinse BID Flora Lipps, MD   15 mL at 10/06/19 0924  . Chlorhexidine Gluconate Cloth 2 % PADS 6 each  6 each Topical Daily Awilda Bill, NP      .  dexamethasone (DECADRON) injection 10 mg  10 mg Intravenous Q6H Awilda Bill, NP   10 mg at 10/06/19 1741  . famotidine (PEPCID) IVPB 20 mg premix  20 mg Intravenous Q12H Flora Lipps, MD      . feeding supplement (PRO-STAT SUGAR FREE 64) liquid 60 mL  60 mL Per Tube BID Flora Lipps, MD      . feeding supplement (VITAL HIGH PROTEIN) liquid 1,000 mL  1,000 mL Per Tube Q24H Flora Lipps, MD   1,000 mL at 10/06/19 1644  . fentaNYL (SUBLIMAZE) bolus via infusion 50 mcg  50 mcg Intravenous Q15 min PRN Awilda Bill, NP      . fentaNYL (SUBLIMAZE) injection 50 mcg  50 mcg Intravenous Once Awilda Bill, NP      . fentaNYL 2584mg in NS 2563m(1053mml) infusion-PREMIX  50-200 mcg/hr Intravenous Continuous BlaAwilda BillP 20 mL/hr at 10/06/19 1745 200 mcg/hr at 10/06/19 1745  . hydrALAZINE (APRESOLINE) injection 10-20 mg  10-20 mg Intravenous Q6H PRN BlaAwilda BillP   20 mg at 10/06/19 0530  . insulin aspart (novoLOG) injection 3-9 Units  3-9 Units Subcutaneous Q4H KasFlora LippsD   6 Units at 10/06/19 1643  . insulin detemir (LEVEMIR) injection 10 Units  10 Units Subcutaneous BID SimCharlett NosePH      . ipratropium-albuterol (DUONEB) 0.5-2.5 (3) MG/3ML nebulizer solution 3 mL  3 mL Nebulization Q4H KasFlora LippsD   3 mL at 10/06/19 1604  . lactated ringers infusion   Intravenous Continuous KasFlora LippsD 100 mL/hr at 10/06/19 1853 New Bag at 10/06/19 1853  . MEDLINE mouth rinse  15 mL Mouth Rinse 10 times per day KasFlora LippsD   15 mL at 10/06/19 1745  . morphine 2 MG/ML injection 2 mg  2 mg Intravenous Q2H PRN DunAthena MasseD      . ondansetron (ZOPick[facility] Centerablet 4 mg  4 mg Oral Q6H PRN DunAthena MasseD       Or  . ondansetron (ZORain[facility] Babies [facility] Childrens Hospitalnjection 4 mg  4 mg Intravenous Q6H PRN DunJudd Gaudier MD      . propofol (DIPRIVAN) 1000 MG/100ML infusion  5-80 mcg/kg/min Intravenous Titrated BlaAwilda BillP 27.2 mL/hr at 10/06/19 1820 45 mcg/kg/min at 10/06/19 1820  .  senna-docusate (Senokot-S) tablet 2 tablet  2 tablet Per Tube BID KasFlora LippsD      . vancomycin (VANCOCIN) IVPB 1000 mg/200  mL premix  1,000 mg Intravenous Q12H Gerald Dexter, RPH 200 mL/hr at 10/06/19 1743 1,000 mg at 10/06/19 1743     Abtx:  Anti-infectives (From admission, onward)   Start     Dose/Rate Route Frequency Ordered Stop   10/06/19 1800  vancomycin (VANCOCIN) IVPB 1000 mg/200 mL premix  Status:  Discontinued     1,000 mg 200 mL/hr over 60 Minutes Intravenous Every 12 hours 10/06/19 0358 10/06/19 1329   10/06/19 1800  vancomycin (VANCOCIN) IVPB 1000 mg/200 mL premix     1,000 mg 200 mL/hr over 60 Minutes Intravenous Every 12 hours 10/06/19 1409     10/06/19 1200  acyclovir (ZOVIRAX) 600 mg in dextrose 5 % 100 mL IVPB  Status:  Discontinued     600 mg 112 mL/hr over 60 Minutes Intravenous Every 8 hours 10/06/19 1113 10/06/19 1329   10/06/19 1000  ceFEPIme (MAXIPIME) 2 g in sodium chloride 0.9 % 100 mL IVPB  Status:  Discontinued     2 g 200 mL/hr over 30 Minutes Intravenous Every 8 hours 10/06/19 0156 10/06/19 0240   10/06/19 0400  ampicillin (OMNIPEN) 2 g in sodium chloride 0.9 % 100 mL IVPB  Status:  Discontinued     2 g 300 mL/hr over 20 Minutes Intravenous Every 4 hours 10/06/19 0240 10/06/19 1329   10/06/19 0300  cefTRIAXone (ROCEPHIN) 2 g in sodium chloride 0.9 % 100 mL IVPB     2 g 200 mL/hr over 30 Minutes Intravenous Every 12 hours 10/06/19 0240     10/06/19 0230  vancomycin (VANCOCIN) IVPB 1000 mg/200 mL premix     1,000 mg 200 mL/hr over 60 Minutes Intravenous  Once 10/05/19 2345 10/06/19 0454   10/06/19 0045  metroNIDAZOLE (FLAGYL) IVPB 500 mg  Status:  Discontinued     500 mg 100 mL/hr over 60 Minutes Intravenous Every 8 hours 10/06/19 0016 10/06/19 0240   10/05/19 2345  ceFEPIme (MAXIPIME) 2 g in sodium chloride 0.9 % 100 mL IVPB     2 g 200 mL/hr over 30 Minutes Intravenous STAT 10/05/19 2333 10/06/19 0225   10/05/19 2345  metroNIDAZOLE (FLAGYL)  IVPB 500 mg  Status:  Discontinued     500 mg 100 mL/hr over 60 Minutes Intravenous  Once 10/05/19 2333 10/06/19 0018   10/05/19 2345  vancomycin (VANCOCIN) IVPB 1000 mg/200 mL premix     1,000 mg 200 mL/hr over 60 Minutes Intravenous  Once 10/05/19 2333 10/06/19 0353      REVIEW OF SYSTEMS:  NA Objective:  VITALS:  BP 91/66   Pulse 85   Temp 97.8 F (36.6 C) (Axillary)   Resp (!) 9   Ht '5\' 5"'$  (1.651 m)   Wt 100.8 kg   LMP 09/30/2014   SpO2 94%   BMI 36.98 kg/m  PHYSICAL EXAM:  General: intubated , sedated Head: Normocephalic, without obvious abnormality, atraumatic. Eyes: Conjunctivae clear, anicteric sclerae. Pupils are equal ENT did not examine Neck: cannot be examined no carotid bruit and no JVD. Back:not examined Lungs: b/l air entry. Heart: tachycardia Abdomen: Soft, non-tender,not distended. Bowel sounds normal. No masses Extremities: atraumatic, no cyanosis. No edema. No clubbing Skin: No rashes or lesions. Or bruising Lymph: Cervical, supraclavicular normal. Neurologic: cannot be examined Pertinent Labs Lab Results CBC    Component Value Date/Time   WBC 19.0 (H) 10/05/2019 2237   RBC 4.86 10/05/2019 2237   HGB 13.5 10/05/2019 2237   HCT 41.0 10/05/2019 2237   PLT 438 (H) 10/05/2019  2237   MCV 84.4 10/05/2019 2237   MCH 27.8 10/05/2019 2237   MCHC 32.9 10/05/2019 2237   RDW 15.4 10/05/2019 2237   LYMPHSABS 2.1 06/03/2018 2359   MONOABS 0.4 06/03/2018 2359   EOSABS 0.1 06/03/2018 2359   BASOSABS 0.1 06/03/2018 2359    CMP Latest Ref Rng & Units 10/06/2019 10/05/2019 06/03/2018  Glucose 70 - 99 mg/dL 352(H) 349(H) 132(H)  BUN 6 - 20 mg/dL '8 9 7  '$ Creatinine 0.44 - 1.00 mg/dL 0.69 0.77 0.91  Sodium 135 - 145 mmol/L 139 138 140  Potassium 3.5 - 5.1 mmol/L 3.4(L) 4.3 3.2(L)  Chloride 98 - 111 mmol/L 106 104 109  CO2 22 - 32 mmol/L 21(L) 20(L) 25  Calcium 8.9 - 10.3 mg/dL 9.0 9.3 8.9  Total Protein 6.5 - 8.1 g/dL 7.7 8.0 7.8  Total Bilirubin 0.3 -  1.2 mg/dL 0.6 0.4 0.2(L)  Alkaline Phos 38 - 126 U/L 80 80 80  AST 15 - 41 U/L '19 21 27  '$ ALT 0 - 44 U/L '20 21 20      '$ Microbiology: Recent Results (from the past 240 hour(s))  Culture, blood (routine x 2)     Status: None (Preliminary result)   Collection Time: 10/06/19 12:13 AM   Specimen: BLOOD  Result Value Ref Range Status   Specimen Description BLOOD LEFT HAND  Final   Special Requests   Final    BOTTLES DRAWN AEROBIC AND ANAEROBIC Blood Culture results may not be optimal due to an inadequate volume of blood received in culture bottles   Culture  Setup Time   Final    Organism ID to follow GRAM POSITIVE COCCI AEROBIC BOTTLE ONLY CRITICAL RESULT CALLED TO, READ BACK BY AND VERIFIED WITH: WALID NAZARI ON 10/06/19 AT 25 QSD Performed at [facility], [address] [place], [place] [address]    Culture GRAM POSITIVE COCCI  Final   Report Status PENDING  Incomplete  Culture, blood (routine x 2)     Status: None (Preliminary result)   Collection Time: 10/06/19 12:13 AM   Specimen: BLOOD  Result Value Ref Range Status   Specimen Description BLOOD RIGHT HAND  Final   Special Requests   Final    BOTTLES DRAWN AEROBIC AND ANAEROBIC Blood Culture results may not be optimal due to an inadequate volume of blood received in culture bottles   Culture  Setup Time   Final    GRAM POSITIVE COCCI IN BOTH AEROBIC AND ANAEROBIC BOTTLES CRITICAL RESULT CALLED TO, READ BACK BY AND VERIFIED WITH: LISA KLUTTZ ON 10/06/19 AT 1032 QSD Performed at [facility], [address] [place], [place] [address]    Culture GRAM POSITIVE COCCI  Final   Report Status PENDING  Incomplete  Blood Culture ID Panel (Reflexed)     Status: Abnormal   Collection Time: 10/06/19 12:13 AM  Result Value Ref Range Status   Enterococcus species NOT DETECTED NOT DETECTED Final   Listeria monocytogenes NOT DETECTED NOT DETECTED Final   Staphylococcus species NOT DETECTED NOT DETECTED Final    Staphylococcus aureus (BCID) NOT DETECTED NOT DETECTED Final   Streptococcus species DETECTED (A) NOT DETECTED Final    Comment: CRITICAL RESULT CALLED TO, READ BACK BY AND VERIFIED WITH: WALID NAZARI ON 10/06/19 AT 1225 QSD    Streptococcus agalactiae NOT DETECTED NOT DETECTED Final   Streptococcus pneumoniae DETECTED (A) NOT DETECTED Final    Comment: CRITICAL RESULT CALLED TO, READ BACK BY AND VERIFIED WITH:  WALID NAZARI ON 10/06/19 AT 1225 QSD    Streptococcus pyogenes NOT DETECTED NOT DETECTED Final   Acinetobacter baumannii NOT DETECTED NOT DETECTED Final   Enterobacteriaceae species NOT DETECTED NOT DETECTED Final   Enterobacter cloacae complex NOT DETECTED NOT DETECTED Final   Escherichia coli NOT DETECTED NOT DETECTED Final   Klebsiella oxytoca NOT DETECTED NOT DETECTED Final   Klebsiella pneumoniae NOT DETECTED NOT DETECTED Final   Proteus species NOT DETECTED NOT DETECTED Final   Serratia marcescens NOT DETECTED NOT DETECTED Final   Haemophilus influenzae NOT DETECTED NOT DETECTED Final   Neisseria meningitidis NOT DETECTED NOT DETECTED Final   Pseudomonas aeruginosa NOT DETECTED NOT DETECTED Final   Candida albicans NOT DETECTED NOT DETECTED Final   Candida glabrata NOT DETECTED NOT DETECTED Final   Candida krusei NOT DETECTED NOT DETECTED Final   Candida parapsilosis NOT DETECTED NOT DETECTED Final   Candida tropicalis NOT DETECTED NOT DETECTED Final    Comment: Performed at [facility], [place], [place] [address]  SARS CORONAVIRUS 2 (TAT 6-24 HRS) Nasopharyngeal Nasopharyngeal Swab     Status: None   Collection Time: 10/06/19  2:30 AM   Specimen: Nasopharyngeal Swab  Result Value Ref Range Status   SARS Coronavirus 2 NEGATIVE NEGATIVE Final    Comment: (NOTE) SARS-CoV-2 target nucleic acids are NOT DETECTED. The SARS-CoV-2 RNA is generally detectable in upper and lower respiratory specimens during the acute phase of infection.  Negative results do not preclude SARS-CoV-2 infection, do not rule out co-infections with other pathogens, and should not be used as the sole basis for treatment or other patient management decisions. Negative results must be combined with clinical observations, patient history, and epidemiological information. The expected result is Negative. Fact Sheet for Patients: SugarRoll.be Fact Sheet for Healthcare Providers: https://www.woods-mathews.com/ This test is not yet approved or cleared by the [place] FDA and  has been authorized for detection and/or diagnosis of SARS-CoV-2 by FDA under an Emergency Use Authorization (EUA). This EUA will remain  in effect (meaning this test can be used) for the duration of the COVID-19 declaration under Section 56 4(b)(1) of the Act, 21 U.S.C. section 360bbb-3(b)(1), unless the authorization is terminated or revoked sooner. Performed at [facility], [place] [address] [place], [place] [address]   Respiratory Panel by PCR     Status: None   Collection Time: 10/06/19  3:40 AM   Specimen: Nasopharyngeal Swab; Respiratory  Result Value Ref Range Status   Adenovirus NOT DETECTED NOT DETECTED Final   Coronavirus 229E NOT DETECTED NOT DETECTED Final    Comment: (NOTE) The Coronavirus on the Respiratory Panel, DOES NOT test for the novel  Coronavirus (2019 nCoV)    Coronavirus HKU1 NOT DETECTED NOT DETECTED Final   Coronavirus NL63 NOT DETECTED NOT DETECTED Final   Coronavirus OC43 NOT DETECTED NOT DETECTED Final   Metapneumovirus NOT DETECTED NOT DETECTED Final   Rhinovirus / Enterovirus NOT DETECTED NOT DETECTED Final   Influenza A NOT DETECTED NOT DETECTED Final   Influenza B NOT DETECTED NOT DETECTED Final   Parainfluenza Virus 1 NOT DETECTED NOT DETECTED Final   Parainfluenza Virus 2 NOT DETECTED NOT DETECTED Final   Parainfluenza Virus 3 NOT DETECTED NOT DETECTED Final   Parainfluenza Virus 4  NOT DETECTED NOT DETECTED Final   Respiratory Syncytial Virus NOT DETECTED NOT DETECTED Final   Bordetella pertussis NOT DETECTED NOT DETECTED Final   Chlamydophila pneumoniae NOT DETECTED NOT DETECTED Final  Mycoplasma pneumoniae NOT DETECTED NOT DETECTED Final    Comment: Performed at [facility], [place] [address], [place], [place] [address]  MRSA PCR Screening     Status: None   Collection Time: 10/06/19  3:41 AM   Specimen: Nasopharyngeal  Result Value Ref Range Status   MRSA by PCR NEGATIVE NEGATIVE Final    Comment:        The GeneXpert MRSA Assay (FDA approved for NASAL specimens only), is one component of a comprehensive MRSA colonization surveillance program. It is not intended to diagnose MRSA infection nor to guide or monitor treatment for MRSA infections. Performed at [facility], [address] [place], [place] [address]     IMAGING RESULTS:     I have personally reviewed the films ? Impression/Recommendation ? ?Pneumococcus bacteremia with meningitis and likely has left mastoiditis  She needs LP Assessment of mastoid- will need imaging of mastoids ENT consult  Continue ceftriaxone + vancomycin meningitis dose. Agree with decadron X 3 days ? ___________________________________________________ Discussed with ICU care team

## 2019-10-06 NOTE — Progress Notes (Signed)
PHARMACY - PHYSICIAN COMMUNICATION CRITICAL VALUE ALERT - BLOOD CULTURE IDENTIFICATION (BCID)  Cathy Hughes is an 59 y.o. female who presented to [facility] on 10/05/2019 with a chief complaint of altered mental status, ear pain.   Assessment:  Patient being covered for possible meningitis.   Name of physician (or Provider) Contacted: Kasa  Current antibiotics: Ampicillin/Ceftriaxone/Vancomycin  Changes to prescribed antibiotics recommended:  No changes recommended or warranted at this time.   No results found for this or any previous visit.  [name] L 10/06/2019  10:56 AM

## 2019-10-06 NOTE — Consult Note (Signed)
Reason for Consult: unresponsiveness  Referring Physician: Dr. Mortimer Fries  CC: Unresponsiveness   HPI: Cathy Hughes is an 59 y.o. female with medical history significant for prediabetes, hypertension, COPD, nicotine dependence, perennial rhinitis, per review of records who initially presented to the ER early on 10/05/2019 with a complaint of severe left ear pain.  She was treated and discharged on oxycodone and prednisone.  She returned to the emergency room later on in the day complaining of headache that started soon after taking the oxycodone.  While being reevaluated in the emergency room, she developed altered mental status, became very lethargic and difficult to arouse and appeared tachypneic. Currently sedated, intubated and not following commands.   Past Medical History:  Diagnosis Date  . Asthma   . COPD (chronic obstructive pulmonary disease) ([place])   . Hypertension   . Pre-diabetes     Past Surgical History:  Procedure Laterality Date  . CHOLECYSTECTOMY    . COLON SURGERY    . thumb surgery      Family History  Problem Relation Age of Onset  . Diabetes Mother   . Hypertension Mother   . Diabetes Father   . Hypertension Father     Social History:  reports that she has been smoking cigarettes. She has a 17.50 pack-year smoking history. She has never used smokeless tobacco. She reports current alcohol use. She reports that she does not use drugs.  Allergies  Allergen Reactions  . Aspirin Hives  . Tramadol Other (See Comments)    Headache   . Tylenol [Acetaminophen] Hives    Medications: I have reviewed the patient's current medications.  ROS: Unable to obtain as on full Ventilator support   Physical Examination: Blood pressure 99/69, pulse 98, temperature 97.9 F (36.6 C), temperature source Axillary, resp. rate 20, height 5\' 5"  (1.651 m), weight 100.8 kg, last menstrual period 09/30/2014, SpO2 95 %.  Sedated Does not follow commands No response to painful  stimuli   Laboratory Studies:   Basic Metabolic Panel: Recent Labs  Lab 10/05/19 2237 10/06/19 0435  NA 138 139  K 4.3 3.4*  CL 104 106  CO2 20* 21*  GLUCOSE 349* 352*  BUN 9 8  CREATININE 0.77 0.69  CALCIUM 9.3 9.0  MG  --  1.6*  PHOS  --  2.2*    Liver Function Tests: Recent Labs  Lab 10/05/19 2237 10/06/19 0435  AST 21 19  ALT 21 20  ALKPHOS 80 80  BILITOT 0.4 0.6  PROT 8.0 7.7  ALBUMIN 3.8 3.6   No results for input(s): LIPASE, AMYLASE in the last 168 hours. No results for input(s): AMMONIA in the last 168 hours.  CBC: Recent Labs  Lab 10/05/19 2237  WBC 19.0*  HGB 13.5  HCT 41.0  MCV 84.4  PLT 438*    Cardiac Enzymes: No results for input(s): CKTOTAL, CKMB, CKMBINDEX, TROPONINI in the last 168 hours.  BNP: Invalid input(s): POCBNP  CBG: Recent Labs  Lab 10/06/19 0222 10/06/19 0324 10/06/19 0357 10/06/19 0734 10/06/19 1144  GLUCAP 321* 343* 353* 290* 255*    Microbiology: Results for orders placed or performed during the hospital encounter of 10/05/19  Culture, blood (routine x 2)     Status: None (Preliminary result)   Collection Time: 10/06/19 12:13 AM   Specimen: BLOOD  Result Value Ref Range Status   Specimen Description BLOOD LEFT HAND  Final   Special Requests   Final    BOTTLES DRAWN AEROBIC AND ANAEROBIC Blood  Culture results may not be optimal due to an inadequate volume of blood received in culture bottles   Culture  Setup Time   Final    Organism ID to follow [place] Performed at [facility], [address] [place], [place] [address]    Culture [facility] POSITIVE COCCI  Final   Report Status PENDING  Incomplete  Culture, blood (routine x 2)     Status: None (Preliminary result)   Collection Time: 10/06/19 12:13 AM   Specimen: BLOOD  Result Value Ref Range Status   Specimen Description BLOOD RIGHT HAND  Final   Special Requests   Final    BOTTLES DRAWN AEROBIC AND ANAEROBIC Blood  Culture results may not be optimal due to an inadequate volume of blood received in culture bottles   Culture  Setup Time   Final    GRAM POSITIVE COCCI AEROBIC BOTTLE ONLY CRITICAL RESULT CALLED TO, READ BACK BY AND VERIFIED WITH: LISA KLUTTZ ON 10/06/19 AT 1032 QSD Performed at [facility], [address], [place], [place] [address]    Culture GRAM POSITIVE COCCI  Final   Report Status PENDING  Incomplete  SARS CORONAVIRUS 2 (TAT 6-24 HRS) Nasopharyngeal Nasopharyngeal Swab     Status: None   Collection Time: 10/06/19  2:30 AM   Specimen: Nasopharyngeal Swab  Result Value Ref Range Status   SARS Coronavirus 2 NEGATIVE NEGATIVE Final    Comment: (NOTE) SARS-CoV-2 target nucleic acids are NOT DETECTED. The SARS-CoV-2 RNA is generally detectable in upper and lower respiratory specimens during the acute phase of infection. Negative results do not preclude SARS-CoV-2 infection, do not rule out co-infections with other pathogens, and should not be used as the sole basis for treatment or other patient management decisions. Negative results must be combined with clinical observations, patient history, and epidemiological information. The expected result is Negative. Fact Sheet for Patients: SugarRoll.be Fact Sheet for Healthcare Providers: https://www.woods-mathews.com/ This test is not yet approved or cleared by the [place] FDA and  has been authorized for detection and/or diagnosis of SARS-CoV-2 by FDA under an Emergency Use Authorization (EUA). This EUA will remain  in effect (meaning this test can be used) for the duration of the COVID-19 declaration under Section 56 4(b)(1) of the Act, 21 U.S.C. section 360bbb-3(b)(1), unless the authorization is terminated or revoked sooner. Performed at [facility], [place] [address] [place], [place] [address]   MRSA PCR Screening     Status: None   Collection Time: 10/06/19  3:41 AM    Specimen: Nasopharyngeal  Result Value Ref Range Status   MRSA by PCR NEGATIVE NEGATIVE Final    Comment:        The GeneXpert MRSA Assay (FDA approved for NASAL specimens only), is one component of a comprehensive MRSA colonization surveillance program. It is not intended to diagnose MRSA infection nor to guide or monitor treatment for MRSA infections. Performed at [facility], [address] [place], [place] [address]     Coagulation Studies: Recent Labs    10/06/19 0435  LABPROT 13.5  INR 1.0    Urinalysis:  Recent Labs  Lab 10/05/19 2205  COLORURINE STRAW*  LABSPEC 1.013  PHURINE 6.0  GLUCOSEU >=500*  HGBUR NEGATIVE  BILIRUBINUR NEGATIVE  KETONESUR 5*  PROTEINUR 30*  NITRITE NEGATIVE  LEUKOCYTESUR NEGATIVE    Lipid Panel:     Component Value Date/Time   TRIG 60 10/06/2019 0435  HgbA1C:  Lab Results  Component Value Date   HGBA1C 8.2 (H) 10/06/2019    Urine Drug Screen:      Component Value Date/Time   LABOPIA NONE DETECTED 10/06/2019 0051   COCAINSCRNUR NONE DETECTED 10/06/2019 0051   LABBENZ NONE DETECTED 10/06/2019 0051   AMPHETMU NONE DETECTED 10/06/2019 0051   THCU NONE DETECTED 10/06/2019 0051   LABBARB NONE DETECTED 10/06/2019 0051    Alcohol Level:  Recent Labs  Lab 10/06/19 0435  ETH <10    Other results: EKG: normal EKG, normal sinus rhythm, unchanged from previous tracings.  Imaging: DG Abd 1 View  Result Date: 10/06/2019 CLINICAL DATA:  Tube placement EXAM: ABDOMEN - 1 VIEW COMPARISON:  Concurrent chest radiograph, abdominal radiograph 10/06/2019 FINDINGS: The tip of the transesophageal tube projects over the gastric lumen. The side port is not visualized on this exam, projecting over the lower thoracic esophagus on concurrent chest radiograph. No high-grade obstructive bowel gas pattern is seen. Few nonspecific air-filled loops of bowel are present in the abdomen. Cholecystectomy clips project over the right  upper quadrant. A Foley catheter is noted in place. A monitoring device projects over the left hemiabdomen. Mild degenerative changes in the spine and pelvis. Osseous structures are otherwise unremarkable. IMPRESSION: The tip of the transesophageal tube projects over the gastric lumen. The side port is not visualized on this exam, but is seen projecting over the lower thoracic esophagus on concurrent chest radiograph. This tube should therefore be advanced at least 5 cm for optimal functioning. Electronically Signed   By: Lovena Le M.D.   On: 10/06/2019 05:41   DG Abd 1 View  Result Date: 10/06/2019 CLINICAL DATA:  Sepsis EXAM: ABDOMEN - 1 VIEW COMPARISON:  None. FINDINGS: The bowel gas pattern is normal. Clips over the right upper quadrant usually from cholecystectomy. No concerning mass effect or gas collection. L4-5 focal disc degeneration. IMPRESSION: Normal bowel gas pattern. Electronically Signed   By: Monte Fantasia M.D.   On: 10/06/2019 04:15   CT Head Wo Contrast  Result Date: 10/05/2019 CLINICAL DATA:  Left ear pain for 1 month, decrease D her Ng EXAM: CT HEAD WITHOUT CONTRAST TECHNIQUE: Contiguous axial images were obtained from the base of the skull through the vertex without intravenous contrast. COMPARISON:  None. FINDINGS: Brain: No evidence of acute infarction, hemorrhage, hydrocephalus, extra-axial collection or mass lesion/mass effect. Cavum septum pellucidum is incidentally noted. Vascular: Atherosclerotic calcification of the carotid siphons. No hyperdense vessel. Skull: This No calvarial fracture or suspicious osseous lesion. No scalp swelling or hematoma. Sinuses/Orbits: Paranasal sinuses and mastoid air cells are predominantly clear. Middle ear cavities are clear. Ossicular chains are grossly normally configured on these thick slice images. External auditory canals are clear. Slightly proptotic appearance of the globes. Included orbital structures are otherwise unremarkable without  abnormal thickening of the extraocular musculature. Other: None IMPRESSION: No acute intracranial findings. Electronically Signed   By: Lovena Le M.D.   On: 10/05/2019 23:24   DG Chest Port 1 View  Result Date: 10/06/2019 CLINICAL DATA:  ETT position EXAM: PORTABLE CHEST 1 VIEW COMPARISON:  Radiograph 10/05/2019 FINDINGS: Low positioning of the endotracheal tube within the lower trachea, approximately 1.8 cm from the carina. Could be retracted at least 2 cm to position in the mid trachea. A transesophageal tube tip is beyond the GE junction while the side port remains in the distal thoracic esophagus, recommend advancing at least 5 cm to position the side port within the gastric lumen for  optimal functioning. Telemetry leads and additional monitoring devices overlie the chest. There are some streaky and patchy opacities in the lungs. No pneumothorax. No visible effusion. Cardiomediastinal contours are stable accounting for differences in projection. No acute osseous or soft tissue abnormality. IMPRESSION: Streaky opacities in the lungs may reflect atelectasis though infection could have a similar appearance. Low positioning of the endotracheal tube, consider retracting 2 cm to position in the mid trachea. Transesophageal tube side port is in the distal thoracic esophagus, consider advancing 5 cm to position in the gastric lumen for optimal functioning. These results will be called to the ordering clinician or representative by the Radiologist Assistant, and communication documented in the PACS or zVision Dashboard. Electronically Signed   By: Lovena Le M.D.   On: 10/06/2019 05:38   DG Chest Port 1 View  Result Date: 10/05/2019 CLINICAL DATA:  Pain EXAM: PORTABLE CHEST 1 VIEW COMPARISON:  Chest radiograph 06/04/2018 FINDINGS: Heart size within normal limits. The patient is rotated to the right. Overlapping soft tissue precludes adequate evaluation of the left lung. Mild elevation of the right  hemidiaphragm. The right lung is clear. No evidence of pleural effusion or pneumothorax. No acute bony abnormality. IMPRESSION: Overlapping soft tissue precludes adequate evaluation of the left lung. The right lung is clear. Mild elevation of the right hemidiaphragm. Electronically Signed   By: Kellie Simmering DO   On: 10/05/2019 23:07     Assessment/Plan:  59 y.o. female with medical history significant for prediabetes, hypertension, COPD, nicotine dependence, perennial rhinitis, per review of records who initially presented to the ER early on 10/05/2019 with a complaint of severe left ear pain.  She was treated and discharged on oxycodone and prednisone.  She returned to the emergency room later on in the day complaining of headache that started soon after taking the oxycodone.  While being reevaluated in the emergency room, she developed altered mental status, became very lethargic and difficult to arouse and appeared tachypneic. Currently sedated, intubated and not following commands.   - CTH no acute abnormalities - agree with meningitis treatment - ID assistance post LP depending on the results - MRI brain and EEG also pending - will follow based on the above imaging findings.  10/06/2019, 12:13 PM

## 2019-10-06 NOTE — Progress Notes (Signed)
Initial Nutrition Assessment  RD working remotely.  DOCUMENTATION CODES:   Obesity unspecified  INTERVENTION:  Initiate Vital High Protein at 25 mL/hr (600 mL goal daily volume) + Pro-Stat 60 mL BID per tube. Provides 1000 kcal, 113 grams of protein, 504 mL H2O daily. With current propofol rate provides 1958 kcal daily.  Provide liquid MVI daily per tube.  Provide minimum free water flush of 30 mL Q4hrs to maintain tube patency.  NUTRITION DIAGNOSIS:   Inadequate oral intake related to inability to eat as evidenced by NPO status.  GOAL:   Provide needs based on ASPEN/SCCM guidelines  MONITOR:   Vent status, Labs, Weight trends, TF tolerance, I & O's  REASON FOR ASSESSMENT:   Ventilator    ASSESSMENT:   59 year old female with PMHx of asthma, COPD, HTN, pre-diabetes admitted with acute respiratory failure secondary to metabolic acidosis, acute toxic metabolic encephalopathy, and sepsis etiology unclear although possibly secondary to meningitis requiring mechanical intubation.   Patient is currently intubated on ventilator support MV: 50 L/min Temp (24hrs), Avg:99.1 F (37.3 C), Min:97.7 F (36.5 C), Max:102.8 F (39.3 C)  Propofol: 36.3 ml/hr (958 kcal daily)  Medications reviewed and include: Decadron 10 mg Q6hrs IV, Novolog 0-20 units Q4hrs, Levemir 10 units BID, acyclovir, ampicillin, ceftriaxone, famotidine, fentanyl gtt, LR at 100 mL/hr, propofol gtt, vancomycin.  Labs reviewed: CBG 255-290, Potassium 3.4, CO2 21, Phosphorus 2.2, Magnesium 1.6.  Enteral Access: OGT  Discussed with RN and on rounds. Plan is to start tube feeds later today after MRI. Plan is to advance OGT by 5 cm per abdominal/chest x-ray this AM.  NUTRITION - FOCUSED PHYSICAL EXAM:  Unable to complete at this time.  Diet Order:   Diet Order            Diet NPO time specified  Diet effective now             EDUCATION NEEDS:   No education needs have been identified at this  time  Skin:  Skin Assessment: Reviewed RN Assessment  Last BM:  Unknown/PTA  Height:   Ht Readings from Last 1 Encounters:  10/05/19 5\' 5"  (1.651 m)   Weight:   Wt Readings from Last 1 Encounters:  10/06/19 100.8 kg   Ideal Body Weight:  56.8 kg  BMI:  Body mass index is 36.98 kg/m.  Estimated Nutritional Needs:   Kcal:  1109-1411 (11-14 kcal/kg)  Protein:  114 grams (2 grams/kg IBW)  Fluid:  1.7-2 L/day  Jacklynn Barnacle, MS, RD, LDN Office: [phone] Pager: [phone] After Hours/Weekend Pager: [phone]

## 2019-10-06 NOTE — Consult Note (Signed)
Pharmacy Antibiotic Note  Cathy Hughes is a 59 y.o. female admitted on 10/05/2019 with sepsis and possible meningitis.  PMH is significant for prediabetes, hypertension, and COPD.  Pharmacy has been consulted for vancomycin dosing.  Patient originally presented to ED with complaint of left ear pain for ~1 month, and later returned to ED the same day with headache and AMS.  She spiked a temperature of 102.8, and has elevated WBC.  Blood cultures (1/25) revealed Streptococcus pneumoniae in 3/4 bottles.  Currently awaiting MRI and lumbar puncture.  Patient is currently on antibiotics day 1.  Plan:  Will discontinue ampicillin and acyclovir as patient grew Streptococcus pneumoniae in blood cultures.  Per discussion with ID pharmacist, will continue ceftriaxone and vancomycin while awaiting antibiotic susceptibilities.     Vancomycin: Received 2 g loading dose.  Continue vancomycin 1000 mg IV BID, and follow trough in the setting of possible meningitis.  Goal trough 15-20.  Expected trough ~15.4. Pharmacy will follow renal function and order vancomycin levels as clinically appropriate.  Height: 5\' 5"  (165.1 cm) Weight: 222 lb 3.6 oz (100.8 kg) IBW/kg (Calculated) : 57  Temp (24hrs), Avg:99.1 F (37.3 C), Min:97.7 F (36.5 C), Max:102.8 F (39.3 C)  Recent Labs  Lab 10/05/19 2237 10/06/19 0013 10/06/19 0435  WBC 19.0*  --   --   CREATININE 0.77  --  0.69  LATICACIDVEN 3.7* 4.3* 2.5*    Estimated Creatinine Clearance: 92.3 mL/min (by C-G formula based on SCr of 0.69 mg/dL).    Allergies  Allergen Reactions  . Aspirin Hives  . Tramadol Other (See Comments)    Headache   . Tylenol [Acetaminophen] Hives    Antimicrobials this admission: 1/25 Ampicillin >> 1/25 1/25 Cefepime >> 1/25 1/25 Metronidazole >> 1/25 1/25 Vancomycin >>  1/25 Ceftriaxone >>   Dose adjustments this admission: None  Microbiology results: 1/25 BCx: Streptococcus pneumoniae (3/4 bottles) 1/25 UCx:  sent  1/25 MRSA PCR: negative 1/25 SARS Coronavirus 2 (swab): negative 1/25 Respiratory panel: sent CSF Cx/fungal:  Sent  Thank you for allowing pharmacy to be a part of this patient's care.  Gerald Dexter, PharmD  10/06/2019 1:46 PM

## 2019-10-06 NOTE — ED Notes (Signed)
Report to [place], Therapist, sports

## 2019-10-06 NOTE — ED Notes (Signed)
Pt laying on stomach, refuses to roll on back for ekg. Pt does not answer questions- is only yelling help. MD is aware.

## 2019-10-06 NOTE — Progress Notes (Signed)
Pt was transport to MRI and back to CCU while on the vent.

## 2019-10-06 NOTE — Consult Note (Signed)
PHARMACY CONSULT NOTE - FOLLOW UP  Pharmacy Consult for Electrolyte Monitoring and Replacement   Recent Labs: Potassium (mmol/L)  Date Value  10/06/2019 3.4 (L)   Magnesium (mg/dL)  Date Value  10/06/2019 1.6 (L)   Calcium (mg/dL)  Date Value  10/06/2019 9.0   Albumin (g/dL)  Date Value  10/06/2019 3.6   Phosphorus (mg/dL)  Date Value  10/06/2019 2.2 (L)   Sodium (mmol/L)  Date Value  10/06/2019 139     Assessment: Pharmacy has been consulted to manage electrolytes for this 59 year-old female admitted with sepsis and possible meningitis.  Patient has a PMH significant for prediabetes, hypertension, and COPD.  Renal function is currently stable.  Electrolytes All electrolytes WNL Patient previously ordered magnesium sulfate IV 2 g x 1 dose.  Will give additional magnesium 2 g x 1 dose for a daily total dose of 4 g. Patient previously ordered IV Kphos 10 mmol x 1 dose.  Will give additional potassium 40 mEq x 1 dose per tube. Will follow electrolytes with AM labs.  Glucose:  A1c 8.2, and receiving IV dexamethasone.  SBG ranging from 250-350.  Per Diabetic Coordinator's recommendation, current inpatient regimen includes resistant SSI and levemir 10 units BID.  Patient is starting tube feeds today, and is ordered Pro-Stat 60 mL BID per tube and VITAL High Protein 1000 mL q24 hours per tube.  Will continue to assess SBG trend daily.    Bowel Regimen LBM unknown (admitted 1/25).  Currently not on a bowel regimen. Will start senna-docusate.   Gerald Dexter ,PharmD 10/06/2019 2:15 PM

## 2019-10-07 ENCOUNTER — Encounter: Payer: Self-pay | Admitting: Internal Medicine

## 2019-10-07 ENCOUNTER — Inpatient Hospital Stay: Payer: Self-pay

## 2019-10-07 ENCOUNTER — Inpatient Hospital Stay (HOSPITAL_COMMUNITY)
Admit: 2019-10-07 | Discharge: 2019-10-07 | Disposition: A | Discharge status: Home / Self Care | Payer: Self-pay | Attending: Critical Care Medicine | Admitting: Critical Care Medicine

## 2019-10-07 DIAGNOSIS — R0689 Other abnormalities of breathing: Secondary | ICD-10-CM

## 2019-10-07 DIAGNOSIS — Z960 Presence of urogenital implants: Secondary | ICD-10-CM

## 2019-10-07 DIAGNOSIS — H9202 Otalgia, left ear: Secondary | ICD-10-CM

## 2019-10-07 LAB — CSF CELL COUNT WITH DIFFERENTIAL
Eosinophils, CSF: 0 %
Eosinophils, CSF: 0 %
Lymphs, CSF: 10 %
Lymphs, CSF: 8 %
Monocyte-Macrophage-Spinal Fluid: 4 %
Monocyte-Macrophage-Spinal Fluid: 5 %
RBC Count, CSF: 0 /mm3 (ref 0–3)
RBC Count, CSF: 0 /mm3 (ref 0–3)
Segmented Neutrophils-CSF: 86 %
Segmented Neutrophils-CSF: 87 %
Tube #: 1
Tube #: 3
WBC, CSF: 108651 /mm3 (ref 0–5)
WBC, CSF: 75961 /mm3 (ref 0–5)

## 2019-10-07 LAB — URINE CULTURE: Culture: NO GROWTH

## 2019-10-07 LAB — CBC WITH DIFFERENTIAL/PLATELET
Abs Immature Granulocytes: 0.15 10*3/uL — ABNORMAL HIGH (ref 0.00–0.07)
Basophils Absolute: 0 10*3/uL (ref 0.0–0.1)
Basophils Relative: 0 %
Eosinophils Absolute: 0 10*3/uL (ref 0.0–0.5)
Eosinophils Relative: 0 %
HCT: 37.1 % (ref 36.0–46.0)
Hemoglobin: 11.9 g/dL — ABNORMAL LOW (ref 12.0–15.0)
Immature Granulocytes: 1 %
Lymphocytes Relative: 5 %
Lymphs Abs: 1.3 10*3/uL (ref 0.7–4.0)
MCH: 27.9 pg (ref 26.0–34.0)
MCHC: 32.1 g/dL (ref 30.0–36.0)
MCV: 86.9 fL (ref 80.0–100.0)
Monocytes Absolute: 0.8 10*3/uL (ref 0.1–1.0)
Monocytes Relative: 3 %
Neutro Abs: 22.8 10*3/uL — ABNORMAL HIGH (ref 1.7–7.7)
Neutrophils Relative %: 91 %
Platelets: 356 10*3/uL (ref 150–400)
RBC: 4.27 MIL/uL (ref 3.87–5.11)
RDW: 15.9 % — ABNORMAL HIGH (ref 11.5–15.5)
Smear Review: NORMAL
WBC: 25.1 10*3/uL — ABNORMAL HIGH (ref 4.0–10.5)
nRBC: 0 % (ref 0.0–0.2)

## 2019-10-07 LAB — PROTIME-INR
INR: 1.1 (ref 0.8–1.2)
Prothrombin Time: 14.3 seconds (ref 11.4–15.2)

## 2019-10-07 LAB — ECHOCARDIOGRAM COMPLETE
Height: 65 in
Weight: 3552.05 oz

## 2019-10-07 LAB — PHOSPHORUS: Phosphorus: 4.7 mg/dL — ABNORMAL HIGH (ref 2.5–4.6)

## 2019-10-07 LAB — GLUCOSE, CAPILLARY
Glucose-Capillary: 287 mg/dL — ABNORMAL HIGH (ref 70–99)
Glucose-Capillary: 295 mg/dL — ABNORMAL HIGH (ref 70–99)
Glucose-Capillary: 282 mg/dL — ABNORMAL HIGH (ref 70–99)
Glucose-Capillary: 238 mg/dL — ABNORMAL HIGH (ref 70–99)
Glucose-Capillary: 197 mg/dL — ABNORMAL HIGH (ref 70–99)
Glucose-Capillary: 220 mg/dL — ABNORMAL HIGH (ref 70–99)
Glucose-Capillary: 220 mg/dL — ABNORMAL HIGH (ref 70–99)
Glucose-Capillary: 201 mg/dL — ABNORMAL HIGH (ref 70–99)
Glucose-Capillary: 200 mg/dL — ABNORMAL HIGH (ref 70–99)
Glucose-Capillary: 206 mg/dL — ABNORMAL HIGH (ref 70–99)
Glucose-Capillary: 190 mg/dL — ABNORMAL HIGH (ref 70–99)
Glucose-Capillary: 231 mg/dL — ABNORMAL HIGH (ref 70–99)

## 2019-10-07 LAB — BASIC METABOLIC PANEL
Anion gap: 8 (ref 5–15)
BUN: 19 mg/dL (ref 6–20)
CO2: 26 mmol/L (ref 22–32)
Calcium: 8.4 mg/dL — ABNORMAL LOW (ref 8.9–10.3)
Chloride: 106 mmol/L (ref 98–111)
Creatinine, Ser: 1.12 mg/dL — ABNORMAL HIGH (ref 0.44–1.00)
GFR calc Af Amer: >60 mL/min (ref >60)
GFR calc non Af Amer: 55 mL/min — ABNORMAL LOW (ref >60)
Glucose, Bld: 280 mg/dL — ABNORMAL HIGH (ref 70–99)
Potassium: 4.2 mmol/L (ref 3.5–5.1)
Sodium: 140 mmol/L (ref 135–145)

## 2019-10-07 LAB — PATHOLOGIST SMEAR REVIEW

## 2019-10-07 LAB — PROCALCITONIN: Procalcitonin: 5.66 ng/mL

## 2019-10-07 LAB — MAGNESIUM: Magnesium: 2.9 mg/dL — ABNORMAL HIGH (ref 1.7–2.4)

## 2019-10-07 LAB — GLUCOSE, CSF: Glucose, CSF: 73 mg/dL — ABNORMAL HIGH (ref 40–70)

## 2019-10-07 LAB — APTT: aPTT: 33 seconds (ref 24–36)

## 2019-10-07 LAB — PROTEIN, CSF: Total  Protein, CSF: 477 mg/dL — ABNORMAL HIGH (ref 15–45)

## 2019-10-07 IMAGING — CT CT ANGIO HEAD
3 of 7 series · 11 of 36 positions shown · IV contrast (omnipaque)
Comparison: MRI head and neck [DATE], head CT [DATE]

CLINICAL DATA: Stroke, follow-up. Additional history provided: Past
medical history significant for pre diabetes, hypertension, COPD,
nicotine dependence, perennial rhinitis, complaint of severe left
ear pain, headache, subsequent development of altered mental status

EXAM:
CT ANGIOGRAPHY HEAD AND NECK
TECHNIQUE: Multidetector CT imaging of the head and neck was performed using
the standard protocol during bolus administration of intravenous
contrast. Multiplanar CT image reconstructions and MIPs were
obtained to evaluate the vascular anatomy. Carotid stenosis
measurements (when applicable) are obtained utilizing NASCET
criteria, using the distal internal carotid diameter as the
denominator.
CONTRAST:  75mL OMNIPAQUE IOHEXOL 350 MG/ML SOLN

[Series 8: cta head neck · axial · 0.51mm/px · z∈[+51,+165]mm · 2 of 172 slices shown]
[im 58/172  soft-tissue]
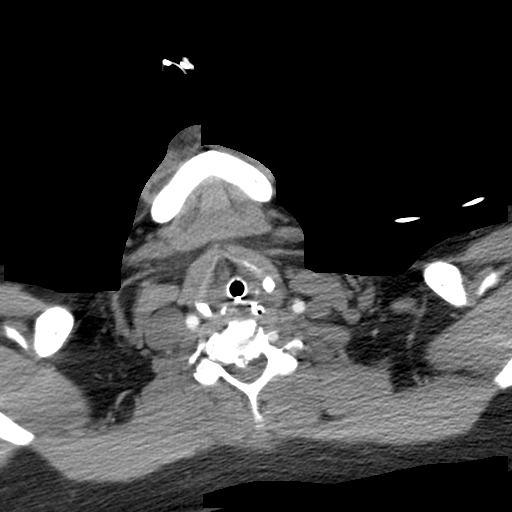
[im 115/172  soft-tissue]
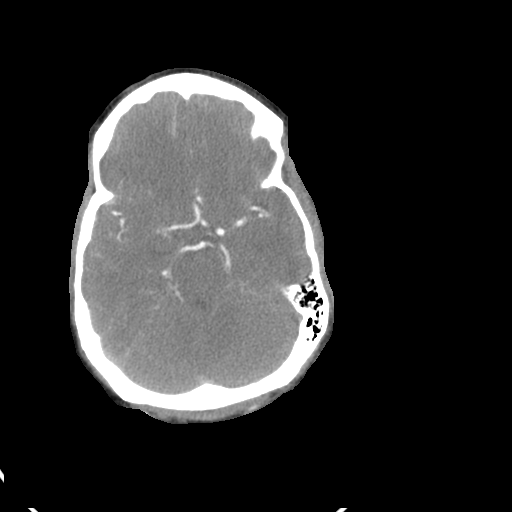

[Series 10: ax thin · axial · 0.51mm/px · z∈[-138,+139]mm · 7 of 404 slices shown]
[im 51/404  soft-tissue]
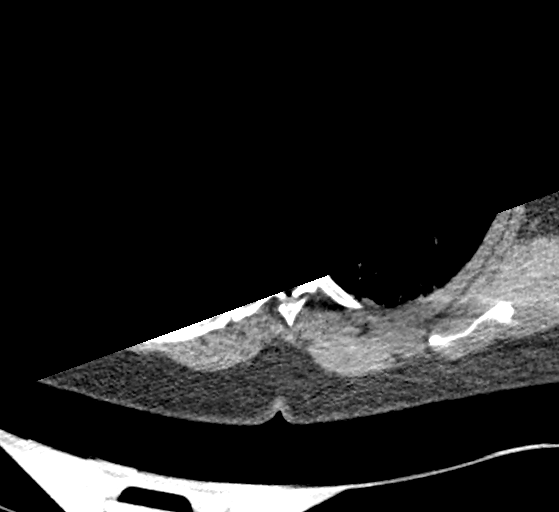
[im 101/404  bone]
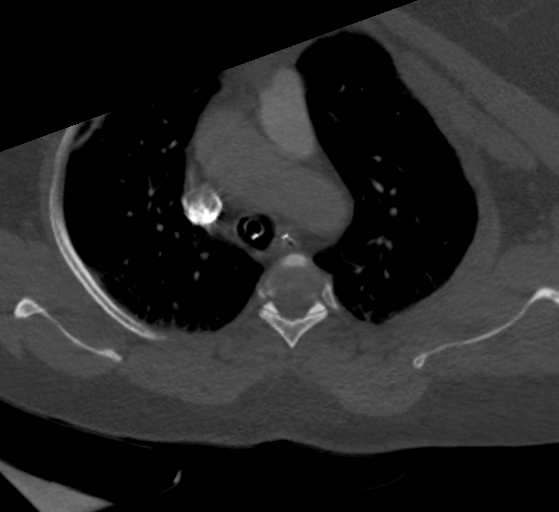
[im 152/404  soft-tissue]
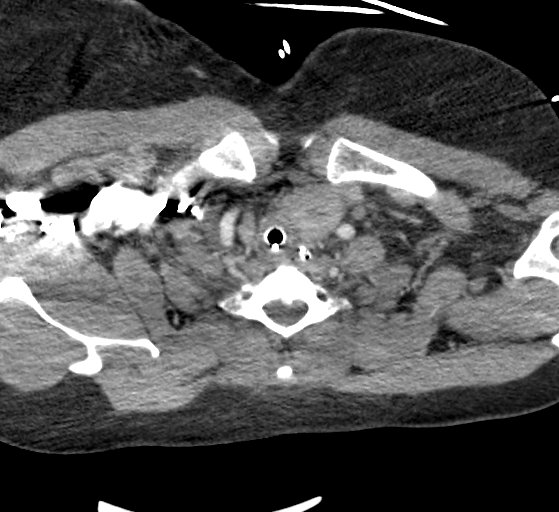
[im 202/404  bone]
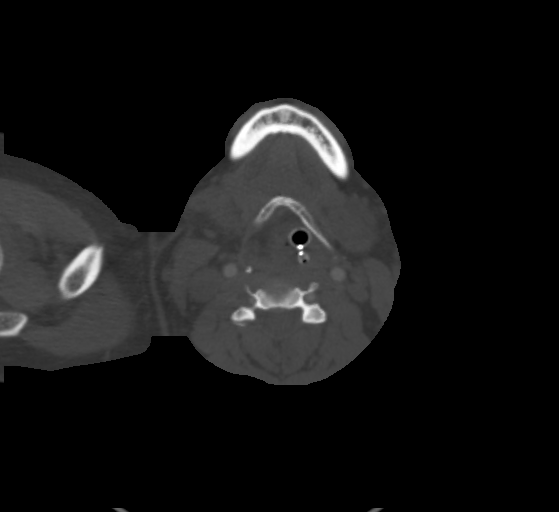
[im 252/404  soft-tissue]
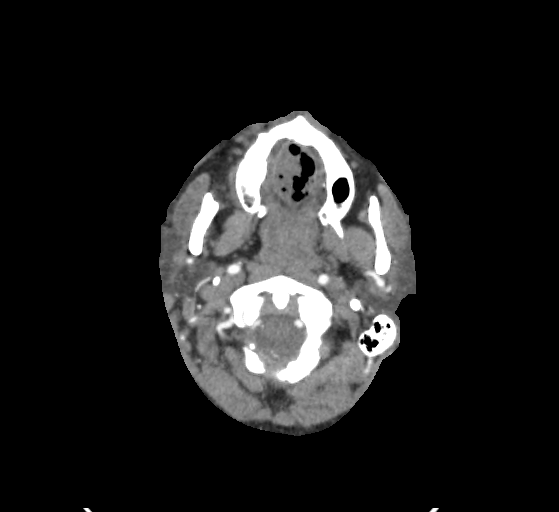
[im 303/404  bone]
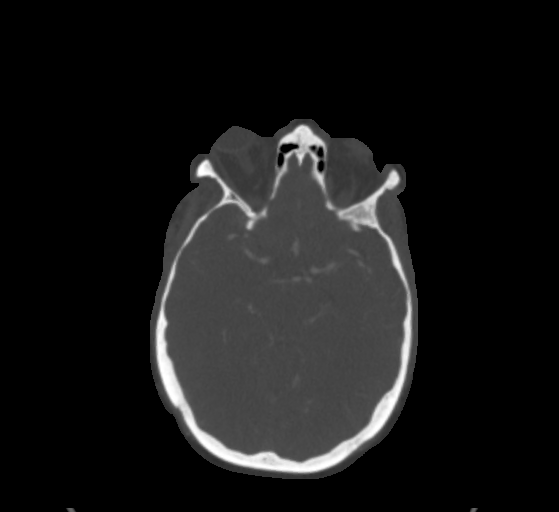
[im 353/404  soft-tissue]
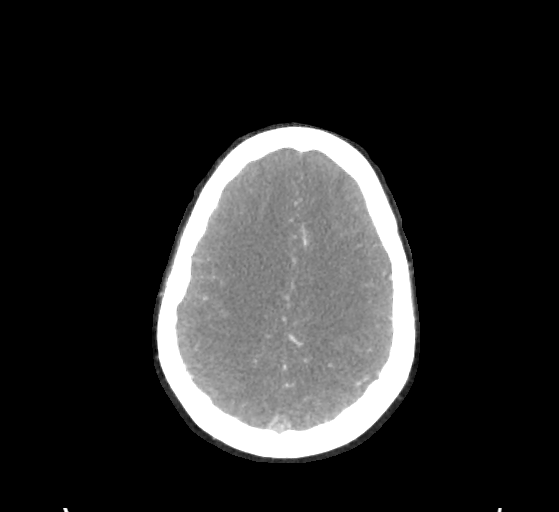

[Series 12: sagittal thin · sagittal · 0.51mm/px · 2 of 232 slices shown]
[im 80/232  soft-tissue]
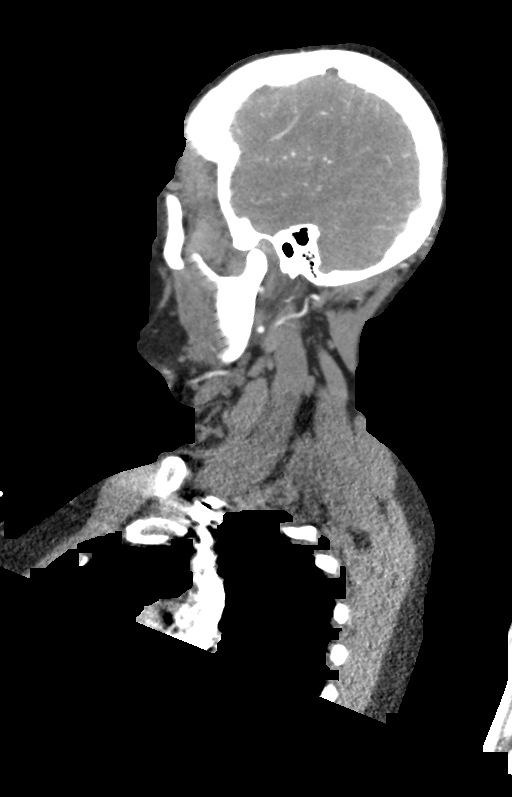
[im 153/232  soft-tissue]
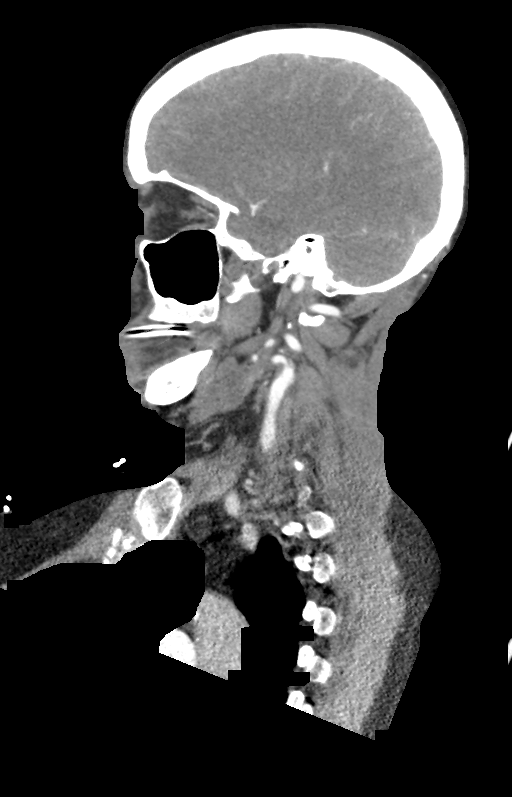

[11 of 36 positions shown; findings below may reference images not displayed]

FINDINGS: CT HEAD FINDINGS

Brain:

No acute infarct is appreciable by CT. No evidence of intracranial
hemorrhage. No evidence of intracranial mass. No midline shift or
extra-axial fluid collection. Mild ill-defined hypoattenuation
within the cerebral white matter is nonspecific, but consistent with
chronic small vessel ischemic disease. Cerebral volume is normal for
age. Redemonstrated cavum septum pellucidum and cavum vergae.

Vascular: Reported separately

Skull: Normal. Negative for fracture or focal lesion.

Sinuses: Secretions within the posterior nasopharynx. Air-fluid
levels within the midline sphenoid sinus. Partial opacification of
left-sided ethmoid air cells. Findings are nonspecific in the
setting of life-support tubes. No significant mastoid effusion.

Orbits: Visualized orbits demonstrate no acute abnormality.

Review of the MIP images confirms the above findings

CTA NECK FINDINGS

Aortic arch: Standard aortic branching. Calcified plaque within the
visualized aortic arch and proximal major branch vessels of the
neck. No high-grade innominate or proximal subclavian stenosis

Right carotid system: Streak artifact from a dense right-sided
contrast bolus slightly limits evaluation of the proximal right
common carotid artery.CCA and ICA patent within the neck without
stenosis. Mild soft plaque versus carotid web within the posterior
aspect of the right carotid bulb (series 12, image 92). Tortuosity
of the distal cervical ICA.

Left carotid system: CCA and ICA patent within the neck without
stenosis mild mixed plaque within the carotid bulb. Slight
tortuosity of the cervical ICA.

Vertebral arteries: Codominant. Streak artifact from a dense
right-sided contrast bolus and motion degradation somewhat limit
evaluation of the right vertebral artery origin and proximal V1
segment. The left vertebral artery is patent within the neck without
stenosis.

Skeleton: No acute bony abnormality. Cervical spondylosis. A
somewhat prominent C5-C6 posterior disc osteophyte complex
contributes to at least mild/moderate spinal canal stenosis. There
is significant narrowing of the right vertebral artery at the level
of the C5 transverse foramen due to mass effect from adjacent
osteophytes. Otherwise, no significant stenosis is identified within
the cervical right vertebral artery.

Other neck: No neck mass or cervical lymphadenopathy.

Upper chest: Centrilobular and paraseptal emphysema within the
partially imaged lungs. There is partially imaged ill-defined
airspace opacity within the dependent right lung apex. An ET tube
terminates at the level of the carina, possibly extending just
slightly into the right mainstem bronchus.

Review of the MIP images confirms the above findings

CTA HEAD FINDINGS

Anterior circulation:

The intracranial internal carotid arteries are patent without
stenosis.

The bilateral middle and anterior cerebral arteries are patent
without proximal branch occlusion or high-grade proximal stenosis.

No intracranial aneurysm is identified

Posterior circulation:

The intracranial vertebral arteries are patent bilaterally.
Moderately narrowed appearance of the distal intracranial right
vertebral artery may be developmental or may reflect an acquired
stenosis. No significant stenosis within the intracranial left
vertebral artery or basilar artery. The bilateral posterior cerebral
arteries are patent without significant proximal stenosis. Posterior
communicating arteries are poorly delineated and may be hypoplastic
or absent bilaterally.

Venous sinuses: Poorly assessed due to contrast timing.

Anatomic variants: As described.

Review of the MIP images confirms the above findings

Impression #3 under the CTA neck findings section will be called to
the ordering clinician or representative by the Radiologist
Assistant, and communication documented in the PACS or zVision
Dashboard.
IMPRESSION: CT head:

1. No CT evidence of acute intracranial abnormality. Please refer to
MRI [DATE] for a description of small foci of restricted
diffusion within the brain.
2. Mild chronic small vessel ischemic disease.

CTA neck:

1. The bilateral common and internal carotid arteries are patent
within the neck without stenosis. Mild soft plaque versus carotid
web present within the posterior right carotid bulb. Mild mixed
plaque within the left carotid bulb.
2. Significant narrowing of the right vertebral artery at the C5
level due to mass effect from adjacent osteophytes. he vertebral
arteries are otherwise patent within the neck without significant
stenosis.
3. The ET tube terminates at the level of the carina and may extend
just slightly into the proximal right mainstem bronchus. Retraction
is advised.
4. Partially visualized airspace opacities within the dependent
right upper lobe. Consider dedicated chest CT for further evaluation

CTA head:

1. No intracranial large vessel occlusion or proximal high-grade
arterial stenosis.
2. Moderate smooth narrowing of the distal intracranial right
vertebral artery may be developmental or may reflect an acquired
stenosis.

## 2019-10-07 IMAGING — DX DG CHEST 1V PORT
1 series · 1 of 1 positions shown · non-contrast
Comparison: [DATE]

CLINICAL DATA: Endotracheal tube placement.  Meningitis.

EXAM:
PORTABLE CHEST 1 VIEW

[chest ap]
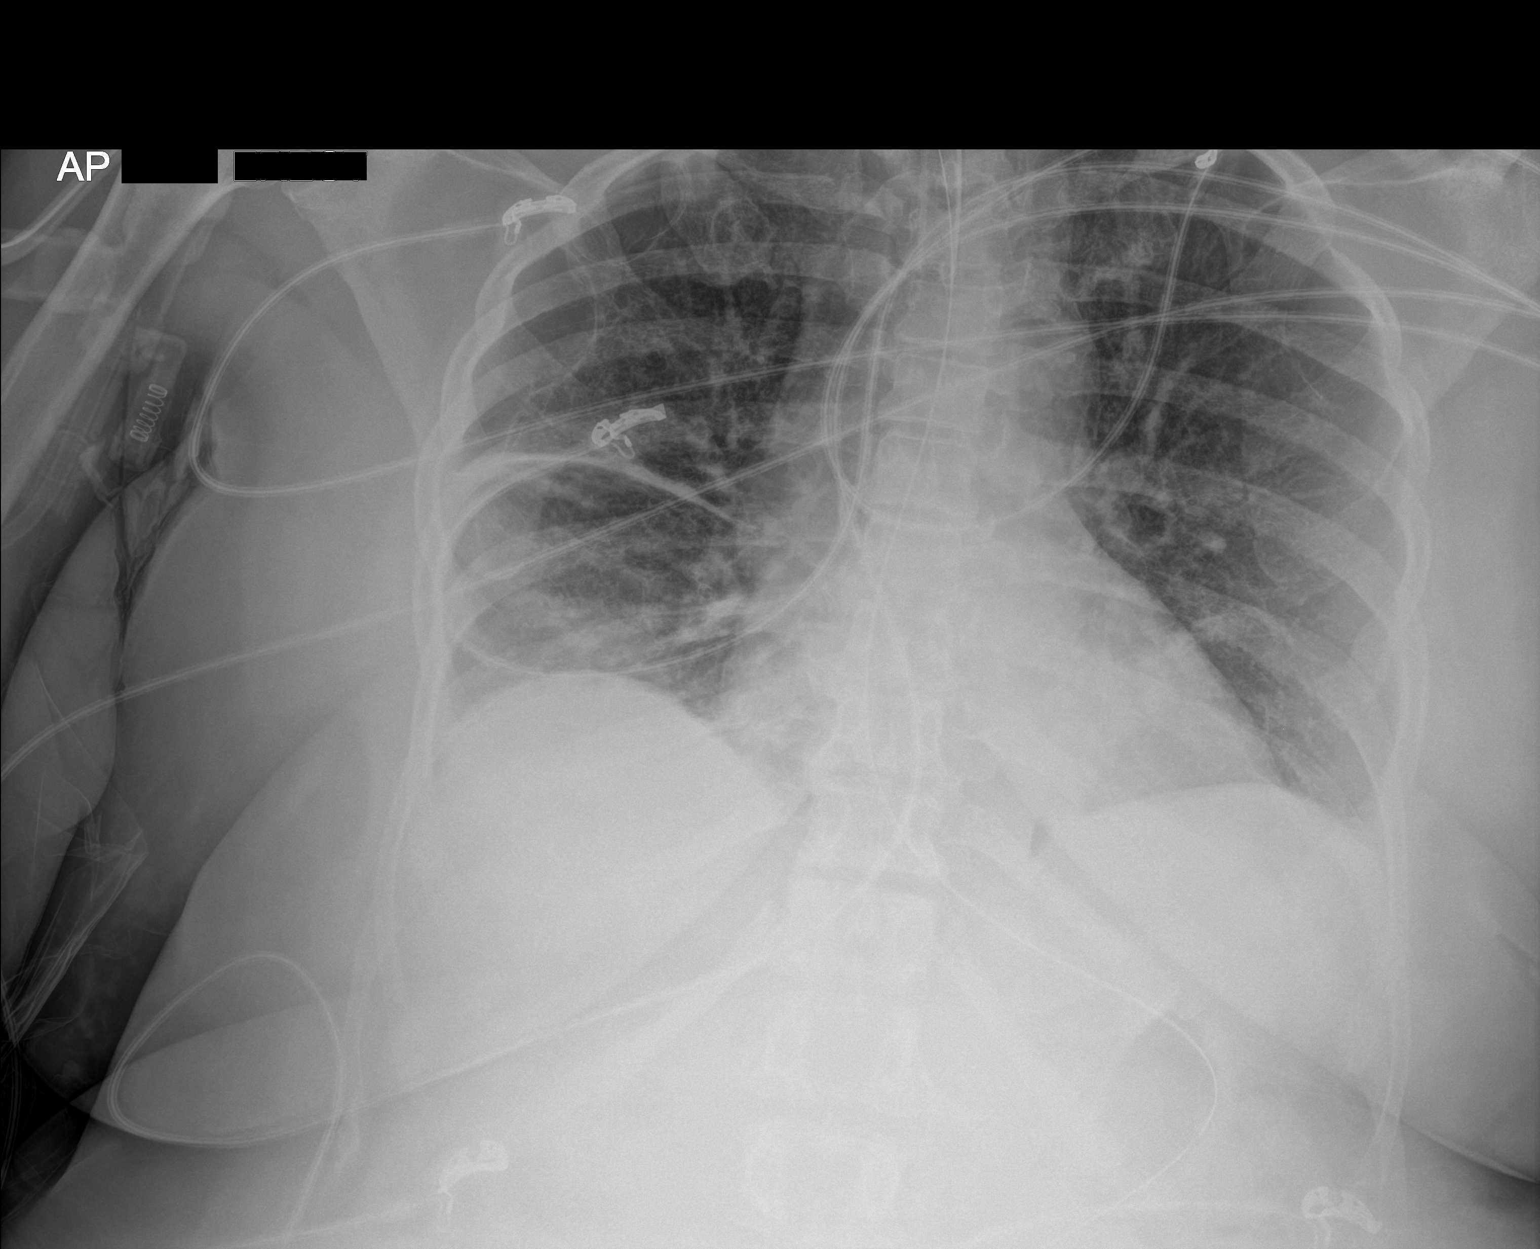

[1 of 1 positions shown; findings below may reference images not displayed]

FINDINGS: Endotracheal tube tip 5 cm above the carina. Orogastric or
nasogastric tube enters the stomach. Patchy atelectasis at the lung
bases is slightly more pronounced. Peripheral emphysematous change
in the upper lungs is evident, right more than left.
IMPRESSION: Endotracheal tube tip 5 cm above the carina.

Slight worsening of basilar atelectasis.

## 2019-10-07 IMAGING — CT CT ANGIO NECK
2 of 10 series · 7 of 35 positions shown · IV contrast (omnipaque)
Comparison: MRI head and neck [DATE], head CT [DATE]

CLINICAL DATA: Stroke, follow-up. Additional history provided: Past
medical history significant for pre diabetes, hypertension, COPD,
nicotine dependence, perennial rhinitis, complaint of severe left
ear pain, headache, subsequent development of altered mental status

EXAM:
CT ANGIOGRAPHY HEAD AND NECK
TECHNIQUE: Multidetector CT imaging of the head and neck was performed using
the standard protocol during bolus administration of intravenous
contrast. Multiplanar CT image reconstructions and MIPs were
obtained to evaluate the vascular anatomy. Carotid stenosis
measurements (when applicable) are obtained utilizing NASCET
criteria, using the distal internal carotid diameter as the
denominator.
CONTRAST:  75mL OMNIPAQUE IOHEXOL 350 MG/ML SOLN

[Series 10: ax thin · axial · 0.51mm/px · z∈[-131,+133]mm · 6 of 404 slices shown]
[im 58/404  soft-tissue]
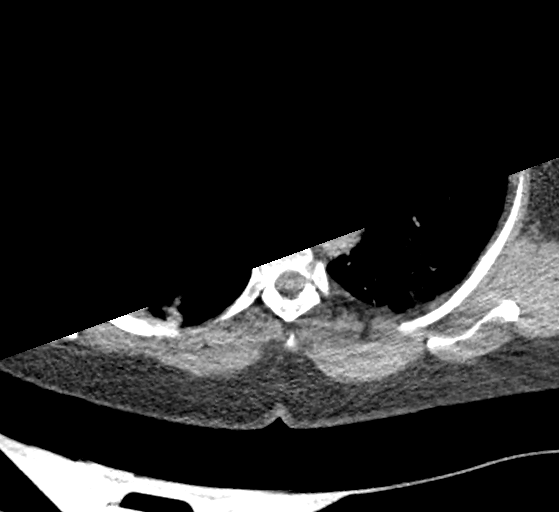
[im 116/404  bone]
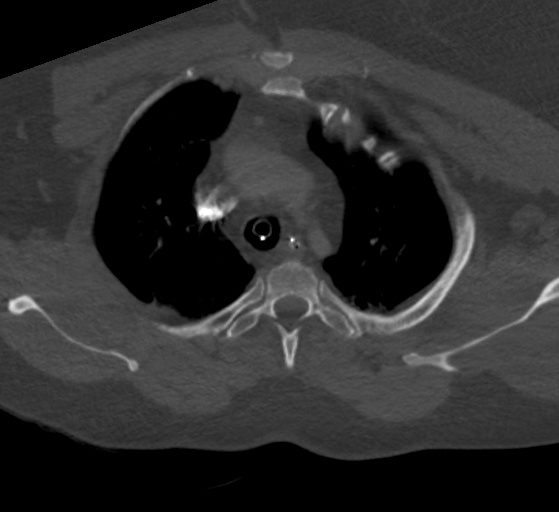
[im 173/404  soft-tissue]
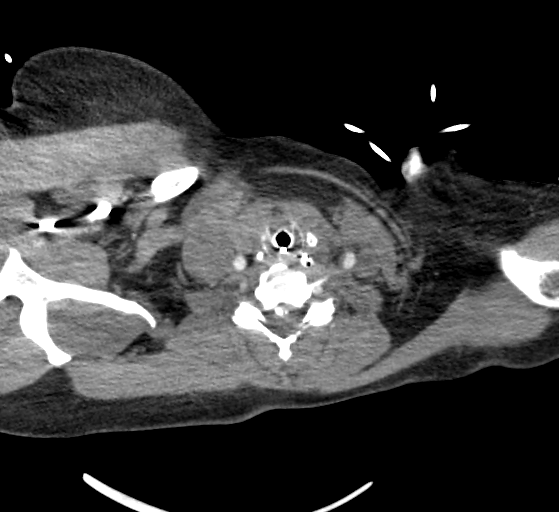
[im 231/404  bone]
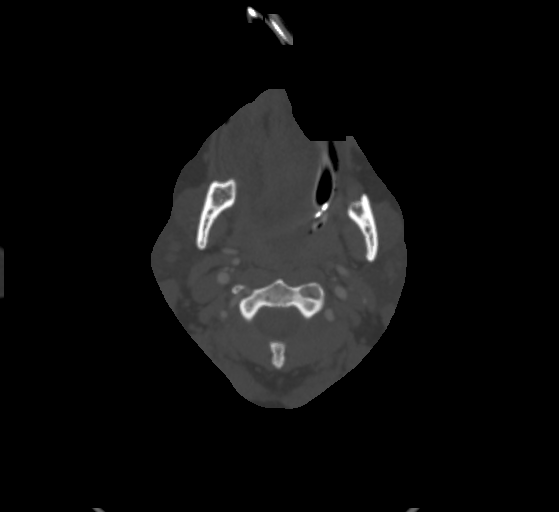
[im 288/404  soft-tissue]
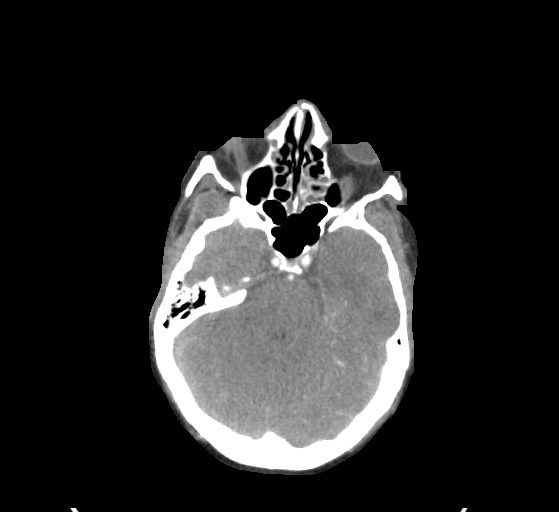
[im 346/404  bone]
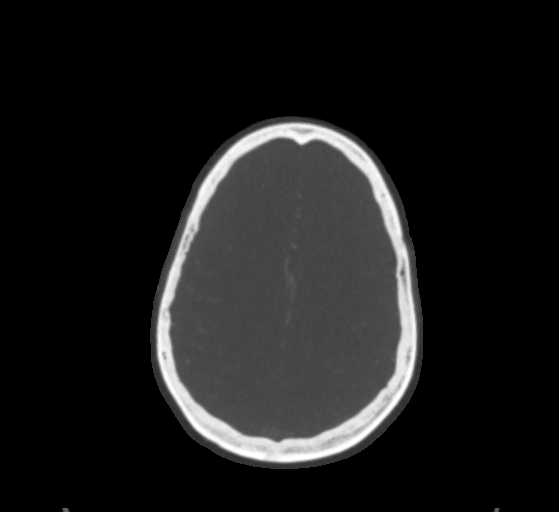

[Series 12: sagittal thin · sagittal · 0.51mm/px · 1 of 232 slices shown]
[im 219/232  soft-tissue]
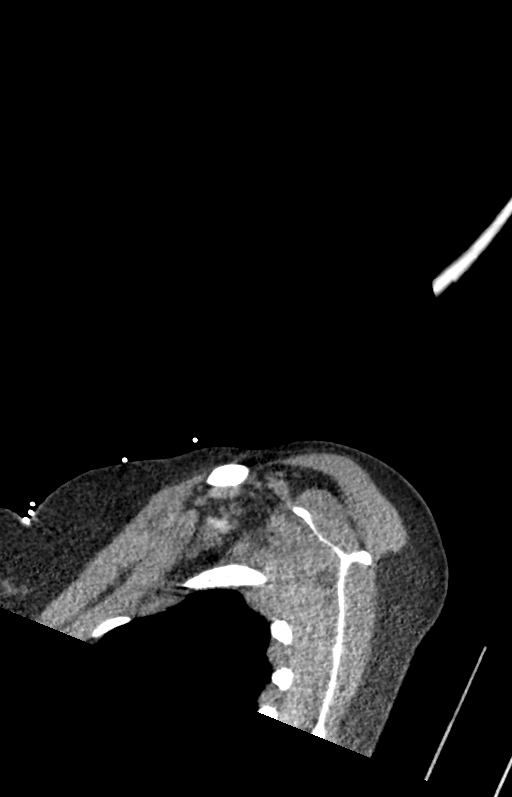

[7 of 35 positions shown; findings below may reference images not displayed]

FINDINGS: CT HEAD FINDINGS

Brain:

No acute infarct is appreciable by CT. No evidence of intracranial
hemorrhage. No evidence of intracranial mass. No midline shift or
extra-axial fluid collection. Mild ill-defined hypoattenuation
within the cerebral white matter is nonspecific, but consistent with
chronic small vessel ischemic disease. Cerebral volume is normal for
age. Redemonstrated cavum septum pellucidum and cavum vergae.

Vascular: Reported separately

Skull: Normal. Negative for fracture or focal lesion.

Sinuses: Secretions within the posterior nasopharynx. Air-fluid
levels within the midline sphenoid sinus. Partial opacification of
left-sided ethmoid air cells. Findings are nonspecific in the
setting of life-support tubes. No significant mastoid effusion.

Orbits: Visualized orbits demonstrate no acute abnormality.

Review of the MIP images confirms the above findings

CTA NECK FINDINGS

Aortic arch: Standard aortic branching. Calcified plaque within the
visualized aortic arch and proximal major branch vessels of the
neck. No high-grade innominate or proximal subclavian stenosis

Right carotid system: Streak artifact from a dense right-sided
contrast bolus slightly limits evaluation of the proximal right
common carotid artery.CCA and ICA patent within the neck without
stenosis. Mild soft plaque versus carotid web within the posterior
aspect of the right carotid bulb (series 12, image 92). Tortuosity
of the distal cervical ICA.

Left carotid system: CCA and ICA patent within the neck without
stenosis mild mixed plaque within the carotid bulb. Slight
tortuosity of the cervical ICA.

Vertebral arteries: Codominant. Streak artifact from a dense
right-sided contrast bolus and motion degradation somewhat limit
evaluation of the right vertebral artery origin and proximal V1
segment. The left vertebral artery is patent within the neck without
stenosis.

Skeleton: No acute bony abnormality. Cervical spondylosis. A
somewhat prominent C5-C6 posterior disc osteophyte complex
contributes to at least mild/moderate spinal canal stenosis. There
is significant narrowing of the right vertebral artery at the level
of the C5 transverse foramen due to mass effect from adjacent
osteophytes. Otherwise, no significant stenosis is identified within
the cervical right vertebral artery.

Other neck: No neck mass or cervical lymphadenopathy.

Upper chest: Centrilobular and paraseptal emphysema within the
partially imaged lungs. There is partially imaged ill-defined
airspace opacity within the dependent right lung apex. An ET tube
terminates at the level of the carina, possibly extending just
slightly into the right mainstem bronchus.

Review of the MIP images confirms the above findings

CTA HEAD FINDINGS

Anterior circulation:

The intracranial internal carotid arteries are patent without
stenosis.

The bilateral middle and anterior cerebral arteries are patent
without proximal branch occlusion or high-grade proximal stenosis.

No intracranial aneurysm is identified

Posterior circulation:

The intracranial vertebral arteries are patent bilaterally.
Moderately narrowed appearance of the distal intracranial right
vertebral artery may be developmental or may reflect an acquired
stenosis. No significant stenosis within the intracranial left
vertebral artery or basilar artery. The bilateral posterior cerebral
arteries are patent without significant proximal stenosis. Posterior
communicating arteries are poorly delineated and may be hypoplastic
or absent bilaterally.

Venous sinuses: Poorly assessed due to contrast timing.

Anatomic variants: As described.

Review of the MIP images confirms the above findings

Impression #3 under the CTA neck findings section will be called to
the ordering clinician or representative by the Radiologist
Assistant, and communication documented in the PACS or zVision
Dashboard.
IMPRESSION: CT head:

1. No CT evidence of acute intracranial abnormality. Please refer to
MRI [DATE] for a description of small foci of restricted
diffusion within the brain.
2. Mild chronic small vessel ischemic disease.

CTA neck:

1. The bilateral common and internal carotid arteries are patent
within the neck without stenosis. Mild soft plaque versus carotid
web present within the posterior right carotid bulb. Mild mixed
plaque within the left carotid bulb.
2. Significant narrowing of the right vertebral artery at the C5
level due to mass effect from adjacent osteophytes. he vertebral
arteries are otherwise patent within the neck without significant
stenosis.
3. The ET tube terminates at the level of the carina and may extend
just slightly into the proximal right mainstem bronchus. Retraction
is advised.
4. Partially visualized airspace opacities within the dependent
right upper lobe. Consider dedicated chest CT for further evaluation

CTA head:

1. No intracranial large vessel occlusion or proximal high-grade
arterial stenosis.
2. Moderate smooth narrowing of the distal intracranial right
vertebral artery may be developmental or may reflect an acquired
stenosis.

## 2019-10-07 IMAGING — RF DG SPINAL PUNCT LUMBAR DIAG WITH FL CT GUIDANCE
1 series · 1 of 1 positions shown · non-contrast
Comparison: none

CLINICAL DATA: Sepsis.

[Series 1: fluoro_iodine 2fps_bw · 0.17mm/px · 1 of 1 slices shown]
[im 1/1]
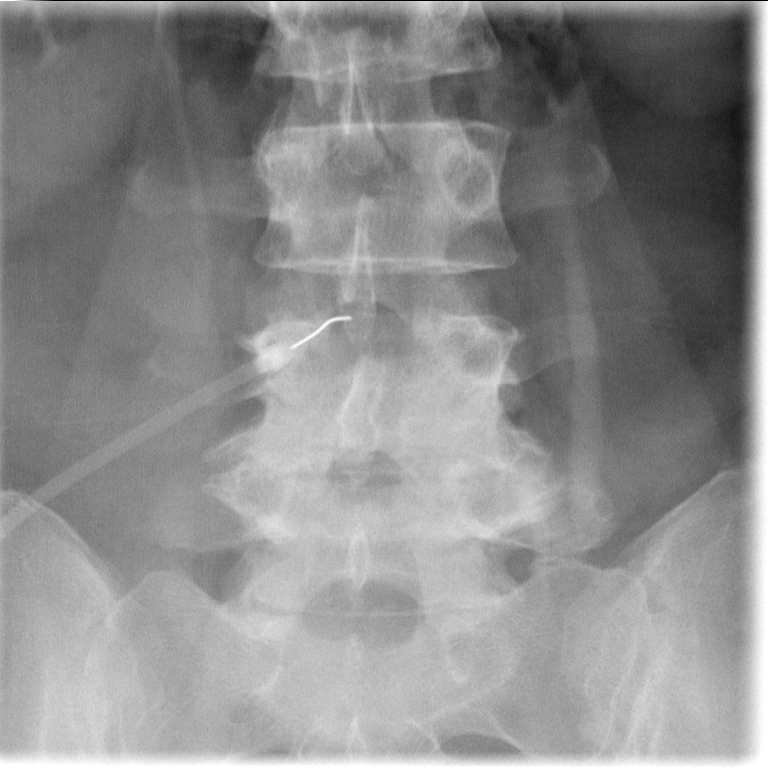

[1 of 1 positions shown; findings below may reference images not displayed]

EXAM:
DIAGNOSTIC LUMBAR PUNCTURE UNDER FLUOROSCOPIC GUIDANCE

FLUOROSCOPY TIME:  Fluoroscopy Time:  1 minutes 12 seconds

Radiation Exposure Index (if provided by the fluoroscopic device):
64.3 mGy

Number of Acquired Spot Images: 1

PROCEDURE:
Informed consent was obtained from the patient prior to the
procedure, including potential complications of headache, allergy,
and pain. With the patient prone, the lower back was prepped with
Betadine. 1% Lidocaine was used for local anesthesia. Lumbar
puncture was performed at the L3-L4 level using a 22 gauge needle
with return of opaque CSF. Eight ml of CSF were obtained for
laboratory studies. The patient tolerated the procedure well and
there were no apparent complications.
IMPRESSION: Successful fluoroscopically directed lumbar puncture. 8 cc of opaque
CSF obtained and sent to lab.

## 2019-10-07 MED ORDER — DEXTROSE IN LACTATED RINGERS 5 % IV SOLN: INTRAVENOUS | Status: DC

## 2019-10-07 MED ORDER — CHLORHEXIDINE GLUCONATE CLOTH 2 % EX PADS
6.0000 | MEDICATED_PAD | Freq: Every day | CUTANEOUS | Status: DC
  Administered 2019-10-07 – 2019-10-14 (×7): 6 via TOPICAL

## 2019-10-07 MED ORDER — ENOXAPARIN SODIUM 40 MG/0.4ML ~~LOC~~ SOLN
40.0000 mg | Freq: Every day | SUBCUTANEOUS | Status: DC
  Administered 2019-10-08 – 2019-10-15 (×8): 40 mg via SUBCUTANEOUS
  Filled 2019-10-07 (×9): qty 0.4

## 2019-10-07 MED ORDER — INSULIN REGULAR(HUMAN) IN NACL 100-0.9 UT/100ML-% IV SOLN: INTRAVENOUS | Status: DC

## 2019-10-07 MED ORDER — DEXTROSE 50 % IV SOLN: 0.0000 mL | INTRAVENOUS | Status: DC | PRN

## 2019-10-07 MED ORDER — INSULIN REGULAR(HUMAN) IN NACL 100-0.9 UT/100ML-% IV SOLN
INTRAVENOUS | Status: DC
  Administered 2019-10-07: 7.5 [IU]/h via INTRAVENOUS
  Administered 2019-10-08: 1.9 [IU]/h via INTRAVENOUS
  Administered 2019-10-08: 9 [IU]/h via INTRAVENOUS
  Administered 2019-10-10: 3 [IU]/h via INTRAVENOUS
  Filled 2019-10-07 (×4): qty 100

## 2019-10-07 MED ORDER — LACTATED RINGERS IV SOLN: INTRAVENOUS | Status: DC

## 2019-10-07 MED ORDER — VANCOMYCIN HCL 1500 MG/300ML IV SOLN
1500.0000 mg | INTRAVENOUS | Status: DC
  Administered 2019-10-07: 1500 mg via INTRAVENOUS
  Filled 2019-10-07 (×2): qty 300

## 2019-10-07 MED ORDER — VANCOMYCIN HCL IN DEXTROSE 1-5 GM/200ML-% IV SOLN
1000.0000 mg | INTRAVENOUS | Status: DC
  Filled 2019-10-07: qty 200

## 2019-10-07 MED ORDER — FAMOTIDINE 20 MG PO TABS
20.0000 mg | ORAL_TABLET | Freq: Two times a day (BID) | ORAL | Status: DC
  Administered 2019-10-07 – 2019-10-10 (×6): 20 mg
  Filled 2019-10-07 (×6): qty 1

## 2019-10-07 MED ORDER — FENTANYL CITRATE (PF) 100 MCG/2ML IJ SOLN
200.0000 ug | Freq: Once | INTRAMUSCULAR | Status: AC
  Administered 2019-10-07: 200 ug via INTRAVENOUS
  Filled 2019-10-07: qty 4

## 2019-10-07 MED ORDER — MIDAZOLAM HCL 2 MG/2ML IJ SOLN
4.0000 mg | Freq: Once | INTRAMUSCULAR | Status: AC
  Administered 2019-10-07: 4 mg via INTRAVENOUS
  Filled 2019-10-07: qty 4

## 2019-10-07 MED ORDER — IOHEXOL 350 MG/ML SOLN
75.0000 mL | Freq: Once | INTRAVENOUS | Status: AC | PRN
  Administered 2019-10-07: 75 mL via INTRAVENOUS

## 2019-10-07 MED ORDER — INSULIN ASPART 100 UNIT/ML ~~LOC~~ SOLN
0.0000 [IU] | SUBCUTANEOUS | Status: DC
  Administered 2019-10-07 (×2): 8 [IU] via SUBCUTANEOUS
  Filled 2019-10-07 (×2): qty 1

## 2019-10-07 MED ORDER — SODIUM CHLORIDE 0.9 % IV SOLN: INTRAVENOUS | Status: DC

## 2019-10-07 MED ORDER — DEXTROSE-NACL 5-0.45 % IV SOLN: INTRAVENOUS | Status: DC

## 2019-10-07 NOTE — Progress Notes (Signed)
Pt stable after lp. Thankyou for consult.

## 2019-10-07 NOTE — Consult Note (Signed)
Subjective: Intubated/not following commands and sedate.    Past Medical History:  Diagnosis Date  . Asthma   . COPD (chronic obstructive pulmonary disease) ([place])   . Hypertension   . Pre-diabetes     Past Surgical History:  Procedure Laterality Date  . CHOLECYSTECTOMY    . COLON SURGERY    . thumb surgery      Family History  Problem Relation Age of Onset  . Diabetes Mother   . Hypertension Mother   . Diabetes Father   . Hypertension Father     Social History:  reports that she has been smoking cigarettes. She has a 17.50 pack-year smoking history. She has never used smokeless tobacco. She reports current alcohol use. She reports that she does not use drugs.  Allergies  Allergen Reactions  . Aspirin Hives  . Tramadol Other (See Comments)    Headache   . Tylenol [Acetaminophen] Hives    Medications: I have reviewed the patient's current medications.  ROS: Unable to obtain as on full Ventilator support   Physical Examination: Blood pressure 101/70, pulse 85, temperature 97.9 F (36.6 C), temperature source Axillary, resp. rate 15, height 5\' 5"  (1.651 m), weight 100.7 kg, last menstrual period 09/30/2014, SpO2 95 %.  Sedated Does not follow commands No response to painful stimuli   Laboratory Studies:   Basic Metabolic Panel: Recent Labs  Lab 10/05/19 2237 10/06/19 0435 10/07/19 0117  NA 138 139 140  K 4.3 3.4* 4.2  CL 104 106 106  CO2 20* 21* 26  GLUCOSE 349* 352* 280*  BUN 9 8 19   CREATININE 0.77 0.69 1.12*  CALCIUM 9.3 9.0 8.4*  MG  --  1.6* 2.9*  PHOS  --  2.2* 4.7*    Liver Function Tests: Recent Labs  Lab 10/05/19 2237 10/06/19 0435  AST 21 19  ALT 21 20  ALKPHOS 80 80  BILITOT 0.4 0.6  PROT 8.0 7.7  ALBUMIN 3.8 3.6   No results for input(s): LIPASE, AMYLASE in the last 168 hours. No results for input(s): AMMONIA in the last 168 hours.  CBC: Recent Labs  Lab 10/05/19 2237 10/07/19 0117  WBC 19.0* 25.1*  NEUTROABS  --  22.8*   HGB 13.5 11.9*  HCT 41.0 37.1  MCV 84.4 86.9  PLT 438* 356    Cardiac Enzymes: No results for input(s): CKTOTAL, CKMB, CKMBINDEX, TROPONINI in the last 168 hours.  BNP: Invalid input(s): POCBNP  CBG: Recent Labs  Lab 10/06/19 2000 10/06/19 2353 10/07/19 0343 10/07/19 0801 10/07/19 1105  GLUCAP 226* 248* 287* 295* 74*    Microbiology: Results for orders placed or performed during the hospital encounter of 10/05/19  Culture, blood (routine x 2)     Status: None (Preliminary result)   Collection Time: 10/06/19 12:13 AM   Specimen: BLOOD  Result Value Ref Range Status   Specimen Description   Final    BLOOD LEFT HAND Performed at [facility], [address], [place], [place] [address]    Special Requests   Final    BOTTLES DRAWN AEROBIC AND ANAEROBIC Blood Culture results may not be optimal due to an inadequate volume of blood received in culture bottles Performed at [facility], [address], [place], [place] [address]    Culture  Setup Time   Final    GRAM POSITIVE COCCI IN BOTH AEROBIC AND ANAEROBIC BOTTLES CRITICAL RESULT CALLED TO, READ BACK BY AND VERIFIED WITH: WALID NAZARI ON 10/06/19 AT 1225 QSD Performed  at [facility], [place] [address], [place], [place] [address]    Culture GRAM POSITIVE COCCI  Final   Report Status PENDING  Incomplete  Culture, blood (routine x 2)     Status: Abnormal (Preliminary result)   Collection Time: 10/06/19 12:13 AM   Specimen: BLOOD  Result Value Ref Range Status   Specimen Description   Final    BLOOD RIGHT HAND Performed at [facility], [address], [place], [place] [address]    Special Requests   Final    BOTTLES DRAWN AEROBIC AND ANAEROBIC Blood Culture results may not be optimal due to an inadequate volume of blood received in culture bottles Performed at [facility], [address], [place], [place] [address]    Culture  Setup Time   Final    GRAM POSITIVE  COCCI IN BOTH AEROBIC AND ANAEROBIC BOTTLES CRITICAL RESULT CALLED TO, READ BACK BY AND VERIFIED WITH: LISA KLUTTZ ON 10/06/19 AT 1032 QSD Performed at [facility], [address] [place], [place] [address]    Culture (A)  Final    STREPTOCOCCUS PNEUMONIAE SUSCEPTIBILITIES TO FOLLOW Performed at [facility], [place] [address] [place], [place] [address]    Report Status PENDING  Incomplete  Blood Culture ID Panel (Reflexed)     Status: Abnormal   Collection Time: 10/06/19 12:13 AM  Result Value Ref Range Status   Enterococcus species NOT DETECTED NOT DETECTED Final   Listeria monocytogenes NOT DETECTED NOT DETECTED Final   Staphylococcus species NOT DETECTED NOT DETECTED Final   Staphylococcus aureus (BCID) NOT DETECTED NOT DETECTED Final   Streptococcus species DETECTED (A) NOT DETECTED Final    Comment: CRITICAL RESULT CALLED TO, READ BACK BY AND VERIFIED WITH: WALID NAZARI ON 10/06/19 AT 1225 QSD    Streptococcus agalactiae NOT DETECTED NOT DETECTED Final   Streptococcus pneumoniae DETECTED (A) NOT DETECTED Final    Comment: CRITICAL RESULT CALLED TO, READ BACK BY AND VERIFIED WITH: WALID NAZARI ON 10/06/19 AT 1225 QSD    Streptococcus pyogenes NOT DETECTED NOT DETECTED Final   Acinetobacter baumannii NOT DETECTED NOT DETECTED Final   Enterobacteriaceae species NOT DETECTED NOT DETECTED Final   Enterobacter cloacae complex NOT DETECTED NOT DETECTED Final   Escherichia coli NOT DETECTED NOT DETECTED Final   Klebsiella oxytoca NOT DETECTED NOT DETECTED Final   Klebsiella pneumoniae NOT DETECTED NOT DETECTED Final   Proteus species NOT DETECTED NOT DETECTED Final   Serratia marcescens NOT DETECTED NOT DETECTED Final   Haemophilus influenzae NOT DETECTED NOT DETECTED Final   Neisseria meningitidis NOT DETECTED NOT DETECTED Final   Pseudomonas aeruginosa NOT DETECTED NOT DETECTED Final   Candida albicans NOT DETECTED NOT DETECTED Final   Candida glabrata NOT  DETECTED NOT DETECTED Final   Candida krusei NOT DETECTED NOT DETECTED Final   Candida parapsilosis NOT DETECTED NOT DETECTED Final   Candida tropicalis NOT DETECTED NOT DETECTED Final    Comment: Performed at [facility], [address] [place], [place] [address]  Urine Culture     Status: None   Collection Time: 10/06/19 12:51 AM   Specimen: Urine, Random  Result Value Ref Range Status   Specimen Description   Final    URINE, RANDOM Performed at [facility], [address], [place], [place] [address]    Special Requests   Final    NONE Performed at [facility], [address], [place],  [address]  Culture   Final    NO GROWTH Performed at [facility], [place] [address] [place], [place] [address]    Report Status 10/07/2019 FINAL  Final  SARS CORONAVIRUS 2 (TAT 6-24 HRS) Nasopharyngeal Nasopharyngeal Swab     Status: None   Collection Time: 10/06/19  2:30 AM   Specimen: Nasopharyngeal Swab  Result Value Ref Range Status   SARS Coronavirus 2 NEGATIVE NEGATIVE Final    Comment: (NOTE) SARS-CoV-2 target nucleic acids are NOT DETECTED. The SARS-CoV-2 RNA is generally detectable in upper and lower respiratory specimens during the acute phase of infection. Negative results do not preclude SARS-CoV-2 infection, do not rule out co-infections with other pathogens, and should not be used as the sole basis for treatment or other patient management decisions. Negative results must be combined with clinical observations, patient history, and epidemiological information. The expected result is Negative. Fact Sheet for Patients: SugarRoll.be Fact Sheet for Healthcare Providers: https://www.woods-mathews.com/ This test is not yet approved or cleared by the [place] FDA and  has been authorized for detection and/or diagnosis of SARS-CoV-2 by FDA under an Emergency Use Authorization (EUA). This EUA  will remain  in effect (meaning this test can be used) for the duration of the COVID-19 declaration under Section 56 4(b)(1) of the Act, 21 U.S.C. section 360bbb-3(b)(1), unless the authorization is terminated or revoked sooner. Performed at [facility], [place] [address], [place], [place] [address]   Respiratory Panel by PCR     Status: None   Collection Time: 10/06/19  3:40 AM   Specimen: Nasopharyngeal Swab; Respiratory  Result Value Ref Range Status   Adenovirus NOT DETECTED NOT DETECTED Final   Coronavirus 229E NOT DETECTED NOT DETECTED Final    Comment: (NOTE) The Coronavirus on the Respiratory Panel, DOES NOT test for the novel  Coronavirus (2019 nCoV)    Coronavirus HKU1 NOT DETECTED NOT DETECTED Final   Coronavirus NL63 NOT DETECTED NOT DETECTED Final   Coronavirus OC43 NOT DETECTED NOT DETECTED Final   Metapneumovirus NOT DETECTED NOT DETECTED Final   Rhinovirus / Enterovirus NOT DETECTED NOT DETECTED Final   Influenza A NOT DETECTED NOT DETECTED Final   Influenza B NOT DETECTED NOT DETECTED Final   Parainfluenza Virus 1 NOT DETECTED NOT DETECTED Final   Parainfluenza Virus 2 NOT DETECTED NOT DETECTED Final   Parainfluenza Virus 3 NOT DETECTED NOT DETECTED Final   Parainfluenza Virus 4 NOT DETECTED NOT DETECTED Final   Respiratory Syncytial Virus NOT DETECTED NOT DETECTED Final   Bordetella pertussis NOT DETECTED NOT DETECTED Final   Chlamydophila pneumoniae NOT DETECTED NOT DETECTED Final   Mycoplasma pneumoniae NOT DETECTED NOT DETECTED Final    Comment: Performed at [facility], [place]. [address], [place], [place] [address]  MRSA PCR Screening     Status: None   Collection Time: 10/06/19  3:41 AM   Specimen: Nasopharyngeal  Result Value Ref Range Status   MRSA by PCR NEGATIVE NEGATIVE Final    Comment:        The GeneXpert MRSA Assay (FDA approved for NASAL specimens only), is one component of a comprehensive MRSA colonization surveillance program. It  is not intended to diagnose MRSA infection nor to guide or monitor treatment for MRSA infections. Performed at [facility], [address] [place], [place] [address]   CULTURE, BLOOD (ROUTINE X 2) w Reflex to ID Panel     Status: None (Preliminary result)   Collection Time: 10/07/19  1:17 AM   Specimen: BLOOD  Result Value Ref Range Status   Specimen Description BLOOD LEFT HAND  Final   Special Requests   Final    BOTTLES DRAWN AEROBIC AND ANAEROBIC Blood Culture adequate volume   Culture   Final    NO GROWTH < 12 HOURS Performed at [facility], [address], [place], [place] [address]    Report Status PENDING  Incomplete  CULTURE, BLOOD (ROUTINE X 2) w Reflex to ID Panel     Status: None (Preliminary result)   Collection Time: 10/07/19  1:17 AM   Specimen: BLOOD  Result Value Ref Range Status   Specimen Description BLOOD LEFT WRIST  Final   Special Requests   Final    BOTTLES DRAWN AEROBIC AND ANAEROBIC Blood Culture adequate volume   Culture   Final    NO GROWTH < 12 HOURS Performed at [facility], [address] [place], [place] [address]    Report Status PENDING  Incomplete    Coagulation Studies: Recent Labs    10/06/19 0435 10/07/19 0821  LABPROT 13.5 14.3  INR 1.0 1.1    Urinalysis:  Recent Labs  Lab 10/05/19 2205  COLORURINE STRAW*  LABSPEC 1.013  PHURINE 6.0  GLUCOSEU >=500*  HGBUR NEGATIVE  BILIRUBINUR NEGATIVE  KETONESUR 5*  PROTEINUR 30*  NITRITE NEGATIVE  LEUKOCYTESUR NEGATIVE    Lipid Panel:     Component Value Date/Time   TRIG 60 10/06/2019 0435    HgbA1C:  Lab Results  Component Value Date   HGBA1C 8.2 (H) 10/06/2019    Urine Drug Screen:      Component Value Date/Time   LABOPIA NONE DETECTED 10/06/2019 0051   COCAINSCRNUR NONE DETECTED 10/06/2019 0051   LABBENZ NONE DETECTED 10/06/2019 0051   AMPHETMU NONE DETECTED 10/06/2019 0051   THCU NONE DETECTED 10/06/2019 0051   LABBARB NONE  DETECTED 10/06/2019 0051    Alcohol Level:  Recent Labs  Lab 10/06/19 0435  ETH <10    Other results: EKG: normal EKG, normal sinus rhythm, unchanged from previous tracings.  Imaging: EEG  Result Date: 10/07/2019 Lora Havens, MD     10/07/2019  9:36 AM Patient Name: Cathy Hughes MRN: [id number] Epilepsy Attending: Lora Havens Referring Physician/Provider: Dr Marda Stalker, NP Date: 10/06/2019 Duration: 21.18 mins Patient history: 59yo F with AMS. EEG to evaluate for seizure. Level of alertness: comatose AEDs during EEG study: propofol Technical aspects: This EEG study was done with scalp electrodes positioned according to the 10-20 International system of electrode placement. Electrical activity was acquired at a sampling rate of 500Hz  and reviewed with a high frequency filter of 70Hz  and a low frequency filter of 1Hz . EEG data were recorded continuously and digitally stored. DESCRIPTION: EEG showed continuous generalized polymorphic 2-3Hz  delta slowing as well as 9-10 generalized alpha activity. There is also 15 to 18 Hz, 2-3 uV beta activity, maximal frontocentral. EEG was reactive to tactile stimulation. Hyperventilation and photic stimulation were not performed. ABNORMALITY - Continuous slow, generalized IMPRESSION: This study is suggestive of severe diffuse encephalopathy, non specific to etiology but could be secondary to sedation. No seizures or epileptiform discharges were seen throughout the recording. Lora Havens   DG Abd 1 View  Result Date: 10/06/2019 CLINICAL DATA:  Tube placement EXAM: ABDOMEN - 1 VIEW COMPARISON:  Concurrent chest radiograph, abdominal radiograph 10/06/2019 FINDINGS: The tip of the transesophageal tube projects over the gastric lumen. The side port is not visualized on  this exam, projecting over the lower thoracic esophagus on concurrent chest radiograph. No high-grade obstructive bowel gas pattern is seen. Few nonspecific air-filled loops of bowel  are present in the abdomen. Cholecystectomy clips project over the right upper quadrant. A Foley catheter is noted in place. A monitoring device projects over the left hemiabdomen. Mild degenerative changes in the spine and pelvis. Osseous structures are otherwise unremarkable. IMPRESSION: The tip of the transesophageal tube projects over the gastric lumen. The side port is not visualized on this exam, but is seen projecting over the lower thoracic esophagus on concurrent chest radiograph. This tube should therefore be advanced at least 5 cm for optimal functioning. Electronically Signed   By: Lovena Le M.D.   On: 10/06/2019 05:41   DG Abd 1 View  Result Date: 10/06/2019 CLINICAL DATA:  Sepsis EXAM: ABDOMEN - 1 VIEW COMPARISON:  None. FINDINGS: The bowel gas pattern is normal. Clips over the right upper quadrant usually from cholecystectomy. No concerning mass effect or gas collection. L4-5 focal disc degeneration. IMPRESSION: Normal bowel gas pattern. Electronically Signed   By: Monte Fantasia M.D.   On: 10/06/2019 04:15   CT Head Wo Contrast  Result Date: 10/05/2019 CLINICAL DATA:  Left ear pain for 1 month, decrease D her Ng EXAM: CT HEAD WITHOUT CONTRAST TECHNIQUE: Contiguous axial images were obtained from the base of the skull through the vertex without intravenous contrast. COMPARISON:  None. FINDINGS: Brain: No evidence of acute infarction, hemorrhage, hydrocephalus, extra-axial collection or mass lesion/mass effect. Cavum septum pellucidum is incidentally noted. Vascular: Atherosclerotic calcification of the carotid siphons. No hyperdense vessel. Skull: This No calvarial fracture or suspicious osseous lesion. No scalp swelling or hematoma. Sinuses/Orbits: Paranasal sinuses and mastoid air cells are predominantly clear. Middle ear cavities are clear. Ossicular chains are grossly normally configured on these thick slice images. External auditory canals are clear. Slightly proptotic appearance of  the globes. Included orbital structures are otherwise unremarkable without abnormal thickening of the extraocular musculature. Other: None IMPRESSION: No acute intracranial findings. Electronically Signed   By: Lovena Le M.D.   On: 10/05/2019 23:24   MR BRAIN W WO CONTRAST  Result Date: 10/06/2019 CLINICAL DATA:  Severe pain in the left ear. Headache. Altered mental status. Lethargy. EXAM: MRI HEAD WITHOUT AND WITH CONTRAST TECHNIQUE: Multiplanar, multiecho pulse sequences of the brain and surrounding structures were obtained without and with intravenous contrast. CONTRAST:  6mL GADAVIST GADOBUTROL 1 MMOL/ML IV SOLN COMPARISON:  Head CT yesterday. FINDINGS: Brain: There are 3 and possibly 4 foci of abnormal restricted diffusion. There is a subcentimeter focus along the lateral margin of the occipital horn of the right lateral ventricle. There is linear restricted diffusion along the medial margin the atrium of the left lateral ventricle. There is a 9 mm focus of restricted diffusion affecting the cortex of the posterior left temporal lobe. There may be a fourth focus at the posteromedial left parietal cortex. This is an unusual pattern. Elsewhere, there is a ordinary pattern of small-vessel disease within the cerebral hemispheric white matter. No large vessel territory infarction. No hydrocephalus. No extra-axial fluid collection. After contrast administration rapid sequences were done which show intravascular enhancement. I think this is probably an artifact of the pulse sequence of Luciana Axe, but this does raise the possibility of leptomeningeal enhancement as might be seen meningitis. One could question very subtle fluid fluid levels in the occipital horns of the lateral ventricles. Aside from meningitis, I think the differential diagnosis could include vasculitis  and an unusual manifestation of embolic infarctions. Vascular: Major vessels at the base of the brain show flow. Skull and upper cervical spine:  Negative Sinuses/Orbits: Clear/normal Other: None IMPRESSION: Unusual examination showing 3 or 4 small foci of restricted diffusion at the right lateral ventricle occipital horn, medial ependymal wall of the atrium of the left lateral ventricle, superficial cortex of the left posterior temporal lobe, and possibly the superficial cortex at the left parietal vertex. Question abnormal intravascular enhancement versus artifact of rapid pulse sequence. Question subtle fluid fluid levels in the occipital horns of the lateral ventricles. Diagnoses I would consider include sequela of meningitis, vasculitis or unusual manifestation of embolic infarction. I think lumbar puncture should be considered. Electronically Signed   By: Nelson Chimes M.D.   On: 10/06/2019 15:48   DG Chest Port 1 View  Result Date: 10/06/2019 CLINICAL DATA:  ETT position EXAM: PORTABLE CHEST 1 VIEW COMPARISON:  Radiograph 10/05/2019 FINDINGS: Low positioning of the endotracheal tube within the lower trachea, approximately 1.8 cm from the carina. Could be retracted at least 2 cm to position in the mid trachea. A transesophageal tube tip is beyond the GE junction while the side port remains in the distal thoracic esophagus, recommend advancing at least 5 cm to position the side port within the gastric lumen for optimal functioning. Telemetry leads and additional monitoring devices overlie the chest. There are some streaky and patchy opacities in the lungs. No pneumothorax. No visible effusion. Cardiomediastinal contours are stable accounting for differences in projection. No acute osseous or soft tissue abnormality. IMPRESSION: Streaky opacities in the lungs may reflect atelectasis though infection could have a similar appearance. Low positioning of the endotracheal tube, consider retracting 2 cm to position in the mid trachea. Transesophageal tube side port is in the distal thoracic esophagus, consider advancing 5 cm to position in the gastric lumen  for optimal functioning. These results will be called to the ordering clinician or representative by the Radiologist Assistant, and communication documented in the PACS or zVision Dashboard. Electronically Signed   By: Lovena Le M.D.   On: 10/06/2019 05:38   DG Chest Port 1 View  Result Date: 10/05/2019 CLINICAL DATA:  Pain EXAM: PORTABLE CHEST 1 VIEW COMPARISON:  Chest radiograph 06/04/2018 FINDINGS: Heart size within normal limits. The patient is rotated to the right. Overlapping soft tissue precludes adequate evaluation of the left lung. Mild elevation of the right hemidiaphragm. The right lung is clear. No evidence of pleural effusion or pneumothorax. No acute bony abnormality. IMPRESSION: Overlapping soft tissue precludes adequate evaluation of the left lung. The right lung is clear. Mild elevation of the right hemidiaphragm. Electronically Signed   By: Kellie Simmering DO   On: 10/05/2019 23:07   MR NECK W WO CONTRAST  Result Date: 10/06/2019 CLINICAL DATA:  Altered mental status. Sepsis. Presented yesterday with left ear pain EXAM: MRI OF THE NECK WITH CONTRAST TECHNIQUE: Multiplanar, multisequence MR imaging was performed following the administration of intravenous contrast. CONTRAST:  34mL GADAVIST GADOBUTROL 1 MMOL/ML IV SOLN COMPARISON:  None. FINDINGS: Pharynx and larynx: Patient intubated. Hypopharynx is deviated towards the left because of the intubation. No primary lesion seen. Salivary glands: Negative Thyroid: Negative Lymph nodes: None Vascular: Jugular veins are patent. Arterial structures difficult to evaluate but appear to show flow. Limited intracranial: See results of brain MRI. Visualized orbits: Not included. Mastoids and visualized paranasal sinuses: See results of brain MRI. Skeleton: Negative.  No evidence of spinal infection. Upper chest: Negative Other:  None IMPRESSION: Endotracheal intubation. Orogastric tube placement. No evidence of intrinsic primary pathology in the neck.  Note that this does not represent a vascular evaluation but a soft tissue evaluation of the neck. Electronically Signed   By: Nelson Chimes M.D.   On: 10/06/2019 15:51     Assessment/Plan:  59 y.o. female with medical history significant for prediabetes, hypertension, COPD, nicotine dependence, perennial rhinitis, per review of records who initially presented to the ER early on 10/05/2019 with a complaint of severe left ear pain.  She was treated and discharged on oxycodone and prednisone.  She returned to the emergency room later on in the day complaining of headache that started soon after taking the oxycodone.  While being reevaluated in the emergency room, she developed altered mental status, became very lethargic and difficult to arouse and appeared tachypneic. Currently sedated, intubated and not following commands.   - CTH no acute abnormalities - MRI worrisome inflammation in posterior circulation. Agree that this could be post infection changes - CTA head and neck ordered to look at vasculature - I do agree she needs LP to be done  10/07/2019, 12:25 PM

## 2019-10-07 NOTE — Consult Note (Signed)
PHARMACY CONSULT NOTE - FOLLOW UP  Pharmacy Consult for Electrolyte Monitoring and Replacement   Recent Labs: Potassium (mmol/L)  Date Value  10/07/2019 4.2   Magnesium (mg/dL)  Date Value  10/07/2019 2.9 (H)   Calcium (mg/dL)  Date Value  10/07/2019 8.4 (L)   Albumin (g/dL)  Date Value  10/06/2019 3.6   Phosphorus (mg/dL)  Date Value  10/07/2019 4.7 (H)   Sodium (mmol/L)  Date Value  10/07/2019 140     Assessment: Pharmacy has been consulted to manage electrolytes for this 59 year-old female admitted with pneumococcal bacteremia and possible meningitis.  Patient has a PMH significant for prediabetes, hypertension, and COPD.  Renal function is currently unstable, as Scr doubled overnight.  Electrolytes All electrolytes WNL No supplementation needed at this time.  Will continue to monitor potassium while patient is on insulin drip.   Electrolytes with AM labs.  Glucose:  A1c 8.2, and receiving IV dexamethasone.  SBG ranging in the 280s.  Patient is currently receiving insulin drip.  She is receiving Pro-Stat 60 mL BID per tube and VITAL High Protein at 25 mL/hour per tube.  Will continue to assess SBG trend daily.    Bowel Regimen LBM unknown (admitted 1/25).  Currently on senna-docusate while previously sedated with fentanyl and propofol infusions.  Fentanyl currently off.  Will assess need for add-on agent tomorrow.  Gerald Dexter ,PharmD 10/07/2019 2:03 PM

## 2019-10-07 NOTE — Progress Notes (Signed)
Dr. Mortimer Fries notified of critical WBC in CSF and CT results showing to retract ETT. No new orders received at this time.

## 2019-10-07 NOTE — Progress Notes (Signed)
[place] visited pt. after brief consultation w/RN.  Pt. in bed, intubated and sedated.  Pt.'s husband @ bedside; husband reported pt. had been brought to ED yesterday w/difficulty breathing after previously being discharged from separate admission for ear infection.  Husband appeared worried; shared pt. has several children at home; other family in the area as well, husband shared.  No needs expressed at this time, but [place] made husband aware of availability.         10/07/19 1600  Clinical Encounter Type  Visited With Patient and family together;Health care provider  Visit Type Initial;Psychological support;Critical Care  Referral From Other (Comment) (RT)  Spiritual Encounters  Spiritual Needs Emotional  Stress Factors  Patient Stress Factors Health changes  Family Stress Factors Health changes;Major life changes;Loss of control

## 2019-10-07 NOTE — Procedures (Addendum)
Patient Name: Cathy Hughes  MRN: [id number]  Epilepsy Attending: Lora Havens  Referring Physician/Provider: Dr Marda Stalker, NP Date: 10/06/2019 Duration: 21.18 mins  Patient history: 59yo F with AMS. EEG to evaluate for seizure.  Level of alertness: comatose  AEDs during EEG study: propofol  Technical aspects: This EEG study was done with scalp electrodes positioned according to the 10-20 International system of electrode placement. Electrical activity was acquired at a sampling rate of 500Hz  and reviewed with a high frequency filter of 70Hz  and a low frequency filter of 1Hz . EEG data were recorded continuously and digitally stored.   DESCRIPTION: EEG showed continuous generalized polymorphic 2-3Hz  delta slowing as well as 9-10 generalized alpha activity. There is also 15 to 18 Hz, 2-3 uV beta activity, maximal frontocentral. EEG was reactive to tactile stimulation. Hyperventilation and photic stimulation were not performed.  ABNORMALITY - Continuous slow, generalized   IMPRESSION: This study is suggestive of severe diffuse encephalopathy, non specific to etiology but could be secondary to sedation. No seizures or epileptiform discharges were seen throughout the recording.    Karon Heckendorn Barbra Sarks

## 2019-10-07 NOTE — Progress Notes (Signed)
Pt off unit at this time for CT scan and lumbar puncture.

## 2019-10-07 NOTE — Progress Notes (Signed)
*  PRELIMINARY RESULTS* Echocardiogram 2D Echocardiogram has been performed.  Cathy Hughes 10/07/2019, 9:23 AM

## 2019-10-07 NOTE — Progress Notes (Signed)
RT assisted with patient transport to CT then to IR and back up to her room in ICU 17 with no complications. Patient was transported on the triliogy ventilator.

## 2019-10-07 NOTE — Progress Notes (Signed)
ID  Remains intubated BP 90/60   Pulse 81   Temp 98 F (36.7 C) (Axillary)   Resp 19   Ht 5\' 5"  (1.651 m)   Wt 100.7 kg   LMP 09/30/2014   SpO2 100%   BMI 36.94 kg/m    Intubated/sedated Foley B/l air entry [id number] Abd soft Edema extremities   CBC Latest Ref Rng & Units 10/07/2019 10/05/2019 06/03/2018  WBC 4.0 - 10.5 K/uL 25.1(H) 19.0(H) 7.3  Hemoglobin 12.0 - 15.0 g/dL 11.9(L) 13.5 12.9  Hematocrit 36.0 - 46.0 % 37.1 41.0 37.3  Platelets 150 - 400 K/uL 356 438(H) 360    CMP Latest Ref Rng & Units 10/07/2019 10/06/2019 10/05/2019  Glucose 70 - 99 mg/dL 280(H) 352(H) 349(H)  BUN 6 - 20 mg/dL 19 8 9   Creatinine 0.44 - 1.00 mg/dL 1.12(H) 0.69 0.77  Sodium 135 - 145 mmol/L 140 139 138  Potassium 3.5 - 5.1 mmol/L 4.2 3.4(L) 4.3  Chloride 98 - 111 mmol/L 106 106 104  CO2 22 - 32 mmol/L 26 21(L) 20(L)  Calcium 8.9 - 10.3 mg/dL 8.4(L) 9.0 9.3  Total Protein 6.5 - 8.1 g/dL - 7.7 8.0  Total Bilirubin 0.3 - 1.2 mg/dL - 0.6 0.4  Alkaline Phos 38 - 126 U/L - 80 80  AST 15 - 41 U/L - 19 21  ALT 0 - 44 U/L - 20 21    BC 10/06/19- 4/4 strep pneumoniae  CSF 108,651  WBC ( 86% N) Total protein 477  CT angio head and neck no obvious lesions or source No intracranial large vessel occlusion or proximal high-grade arterial stenosis. 2. Moderate smooth narrowing of the distal intracranial right vertebral artery may be developmental or may reflect an acquired Stenosis.  Impression/Recommedation  Strep pneumo bacteremia with purulent meningitis- extensive bioburden With such high wbc count and h/o left ear pain and stuffiness for 3 weeks need to r/o mastoid, middle ear infection with extension to brain- CTA head/neck MRI unrevealing Recommend ENT to evaluate the patient and suggest further imaging to delineate the inner/middle ear and mastoid if needed  Recommend TEE- 2 d echo unable to visualize valves Continue ceftriaxone and vanco until susceptibilities available Continue  decadron   Acute hypoxic resp failure- intubated  Critically ill with guarded pognosis Discussed with intensivist

## 2019-10-07 NOTE — Progress Notes (Signed)
Updated pts daughter Haroldine Laws regarding plan of care and all questions were answered.  Marda Stalker, [place] Pager [phone] (please enter 7 digits) PCCM Consult Pager [phone] (please enter 7 digits)

## 2019-10-07 NOTE — Consult Note (Signed)
Pharmacy Antibiotic Note  Cathy Hughes is a 59 y.o. female admitted on 10/05/2019 with pneumococcal bacteremia and possible meningitis.  PMH is significant for prediabetes, hypertension, and COPD.  Pharmacy has been consulted for vancomycin dosing.  Patient originally presented to ED with complaint of left ear pain for ~1 month, and later returned to ED the same day with headache and AMS.  She is currently afebrile with elevated WBC on IV steroids.  PCT increased from 2.13 to 5.66.  Blood cultures (1/25) revealed Streptococcus pneumoniae in 4/4 bottles.  MRI revealed potential for sequela of meningitis, vasculitis or unusual manifestation of embolic infarction.  Currently awaiting results of lumbar puncture performed today.  Patient is currently on antibiotics day 2.  Plan: Vancomycin: Will give vancomycin 1500mg  IV q24 hours, and follow trough in the setting of possible meningitis.  Goal trough 15-20.  Will continue to closely monitor renal function, as Scr nearly doubled overnight.  Pharmacy will order vancomycin levels as clinically appropriate.  Height: 5\' 5"  (165.1 cm) Weight: 222 lb 0.1 oz (100.7 kg) IBW/kg (Calculated) : 57  Temp (24hrs), Avg:97.8 F (36.6 C), Min:97.5 F (36.4 C), Max:98 F (36.7 C)  Recent Labs  Lab 10/05/19 2237 10/06/19 0013 10/06/19 0435 10/07/19 0117  WBC 19.0*  --   --  25.1*  CREATININE 0.77  --  0.69 1.12*  LATICACIDVEN 3.7* 4.3* 2.5*  --     Estimated Creatinine Clearance: 66 mL/min (A) (by C-G formula based on SCr of 1.12 mg/dL (H)).    Allergies  Allergen Reactions  . Aspirin Hives  . Tramadol Other (See Comments)    Headache   . Tylenol [Acetaminophen] Hives    Antimicrobials this admission: 1/25 Ampicillin >> 1/25 1/25 Cefepime >> 1/25 1/25 Metronidazole >> 1/25 1/25 Vancomycin >>  1/25 Ceftriaxone >>   Dose adjustments this admission: 1/26 Vancomycin 1000 mg BID >> 1500 mg q24 hours  Microbiology results: 1/26 BCx:  NG x 12  hours 1/26 CSF Fungal Cx:  sent 1/26 CSF Cx:  sent 1/25 BCx: Streptococcus pneumoniae (4/4 bottles) 1/25 UCx: NG 1/25 MRSA PCR: negative 1/25 SARS Coronavirus 2 (swab): negative 1/25 Respiratory panel: negative CSF Cx/fungal:  sent  Thank you for allowing pharmacy to be a part of this patient's care.  Gerald Dexter, PharmD  10/07/2019 2:02 PM

## 2019-10-07 NOTE — Progress Notes (Signed)
CRITICAL CARE NOTE BRIEF PATIENT DESCRIPTION:  59 yo female admitted with acute respiratory failure secondary to metabolic acidosis, acute toxic metabolic encephalopathy, and sepsis etiology secondary to meningitis requiring mechanical intubation   SIGNIFICANT EVENTS/STUDIES:  01/24: Pt arrived to the ER with c/o headache after taking oxycodone requiring admission to the stepdown unit  01/24: CT Head revealed no acute intracranial findings 01/25: Pt became minimally responsive after receiving low dose morphine and ativan, therefore PCCM consulted to assist with treatment plan.  Pt subsequently required mechanical intubation due to acute respiratory failure  1/25 3/4 blood cultures +STREP PNEUMONIA, husband updated  MICRO DATA: Rapid COVID-19 01/24>>negative  Blood x2 01/25>> Urine 01/25>> COVID-19 01/24>>  ANTIMICROBIALS: Cefepime x1 dose 01/24 Metronidazole x1 dose 01/24 Ceftriaxone 01/25>> Ampicillin 01/25>>1/26 Vancomycin 01/25>>        CC  follow up respiratory failure  SUBJECTIVE Patient remains critically ill Prognosis is guarded    BP 90/60   Pulse 77   Temp 97.6 F (36.4 C) (Axillary)   Resp 20   Ht '5\' 5"'$  (1.651 m)   Wt 100.7 kg   LMP 09/30/2014   SpO2 97%   BMI 36.94 kg/m    I/O last 3 completed shifts: In: 3447.6 [I.V.:2353.8; NG/GT:331.7; IV Piggyback:762.1] Out: 7116 [Urine:4950; Emesis/NG output:295] No intake/output data recorded.  SpO2: 97 % O2 Flow Rate (L/min): 2 L/min FiO2 (%): 50 %    REVIEW OF SYSTEMS  PATIENT IS UNABLE TO PROVIDE COMPLETE REVIEW OF SYSTEMS DUE TO SEVERE CRITICAL ILLNESS   PHYSICAL EXAMINATION:  GENERAL:critically ill appearing, +resp distress HEAD: Normocephalic, atraumatic.  EYES: Pupils equal, round, reactive to light.  No scleral icterus.  MOUTH: Moist mucosal membrane. NECK: Supple.  PULMONARY: +rhonchi, +wheezing CARDIOVASCULAR: S1 and S2. Regular rate and rhythm. No murmurs, rubs, or gallops.   GASTROINTESTINAL: Soft, nontender, -distended.  Positive bowel sounds.   MUSCULOSKELETAL: No swelling, clubbing, or edema.  NEUROLOGIC: obtunded, GCS<8 SKIN:intact,warm,dry  MEDICATIONS: I have reviewed all medications and confirmed regimen as documented   CULTURE RESULTS   Recent Results (from the past 240 hour(s))  Culture, blood (routine x 2)     Status: None (Preliminary result)   Collection Time: 10/06/19 12:13 AM   Specimen: BLOOD  Result Value Ref Range Status   Specimen Description BLOOD LEFT HAND  Final   Special Requests   Final    BOTTLES DRAWN AEROBIC AND ANAEROBIC Blood Culture results may not be optimal due to an inadequate volume of blood received in culture bottles   Culture  Setup Time   Final    Organism ID to follow [place] CRITICAL RESULT CALLED TO, READ BACK BY AND VERIFIED WITH: WALID NAZARI ON 10/06/19 AT 11 QSD Performed at [facility], [address], [place], [place] [address]    Culture GRAM POSITIVE COCCI  Final   Report Status PENDING  Incomplete  Culture, blood (routine x 2)     Status: None (Preliminary result)   Collection Time: 10/06/19 12:13 AM   Specimen: BLOOD  Result Value Ref Range Status   Specimen Description BLOOD RIGHT HAND  Final   Special Requests   Final    BOTTLES DRAWN AEROBIC AND ANAEROBIC Blood Culture results may not be optimal due to an inadequate volume of blood received in culture bottles   Culture  Setup Time   Final    GRAM POSITIVE COCCI IN BOTH AEROBIC AND ANAEROBIC BOTTLES CRITICAL RESULT CALLED TO, READ BACK  BY AND VERIFIED WITH: LISA KLUTTZ ON 10/06/19 AT 1032 QSD Performed at [facility], [address] [place], [place] [address]    Culture [facility] POSITIVE COCCI  Final   Report Status PENDING  Incomplete  Blood Culture ID Panel (Reflexed)     Status: Abnormal   Collection Time: 10/06/19 12:13 AM  Result Value Ref Range Status   Enterococcus  species NOT DETECTED NOT DETECTED Final   Listeria monocytogenes NOT DETECTED NOT DETECTED Final   Staphylococcus species NOT DETECTED NOT DETECTED Final   Staphylococcus aureus (BCID) NOT DETECTED NOT DETECTED Final   Streptococcus species DETECTED (A) NOT DETECTED Final    Comment: CRITICAL RESULT CALLED TO, READ BACK BY AND VERIFIED WITH: WALID NAZARI ON 10/06/19 AT 1225 QSD    Streptococcus agalactiae NOT DETECTED NOT DETECTED Final   Streptococcus pneumoniae DETECTED (A) NOT DETECTED Final    Comment: CRITICAL RESULT CALLED TO, READ BACK BY AND VERIFIED WITH: WALID NAZARI ON 10/06/19 AT 1225 QSD    Streptococcus pyogenes NOT DETECTED NOT DETECTED Final   Acinetobacter baumannii NOT DETECTED NOT DETECTED Final   Enterobacteriaceae species NOT DETECTED NOT DETECTED Final   Enterobacter cloacae complex NOT DETECTED NOT DETECTED Final   Escherichia coli NOT DETECTED NOT DETECTED Final   Klebsiella oxytoca NOT DETECTED NOT DETECTED Final   Klebsiella pneumoniae NOT DETECTED NOT DETECTED Final   Proteus species NOT DETECTED NOT DETECTED Final   Serratia marcescens NOT DETECTED NOT DETECTED Final   Haemophilus influenzae NOT DETECTED NOT DETECTED Final   Neisseria meningitidis NOT DETECTED NOT DETECTED Final   Pseudomonas aeruginosa NOT DETECTED NOT DETECTED Final   Candida albicans NOT DETECTED NOT DETECTED Final   Candida glabrata NOT DETECTED NOT DETECTED Final   Candida krusei NOT DETECTED NOT DETECTED Final   Candida parapsilosis NOT DETECTED NOT DETECTED Final   Candida tropicalis NOT DETECTED NOT DETECTED Final    Comment: Performed at [facility], [place], [place] [address]  SARS CORONAVIRUS 2 (TAT 6-24 HRS) Nasopharyngeal Nasopharyngeal Swab     Status: None   Collection Time: 10/06/19  2:30 AM   Specimen: Nasopharyngeal Swab  Result Value Ref Range Status   SARS Coronavirus 2 NEGATIVE NEGATIVE Final    Comment: (NOTE) SARS-CoV-2 target nucleic  acids are NOT DETECTED. The SARS-CoV-2 RNA is generally detectable in upper and lower respiratory specimens during the acute phase of infection. Negative results do not preclude SARS-CoV-2 infection, do not rule out co-infections with other pathogens, and should not be used as the sole basis for treatment or other patient management decisions. Negative results must be combined with clinical observations, patient history, and epidemiological information. The expected result is Negative. Fact Sheet for Patients: SugarRoll.be Fact Sheet for Healthcare Providers: https://www.woods-mathews.com/ This test is not yet approved or cleared by the [place] FDA and  has been authorized for detection and/or diagnosis of SARS-CoV-2 by FDA under an Emergency Use Authorization (EUA). This EUA will remain  in effect (meaning this test can be used) for the duration of the COVID-19 declaration under Section 56 4(b)(1) of the Act, 21 U.S.C. section 360bbb-3(b)(1), unless the authorization is terminated or revoked sooner. Performed at [facility], [place] [address], [place],  [address]   Respiratory Panel by PCR     Status: None   Collection Time: 10/06/19  3:40 AM   Specimen: Nasopharyngeal Swab; Respiratory  Result Value Ref Range Status   Adenovirus NOT DETECTED NOT DETECTED  Final   Coronavirus 229E NOT DETECTED NOT DETECTED Final    Comment: (NOTE) The Coronavirus on the Respiratory Panel, DOES NOT test for the novel  Coronavirus (2019 nCoV)    Coronavirus HKU1 NOT DETECTED NOT DETECTED Final   Coronavirus NL63 NOT DETECTED NOT DETECTED Final   Coronavirus OC43 NOT DETECTED NOT DETECTED Final   Metapneumovirus NOT DETECTED NOT DETECTED Final   Rhinovirus / Enterovirus NOT DETECTED NOT DETECTED Final   Influenza A NOT DETECTED NOT DETECTED Final   Influenza B NOT DETECTED NOT DETECTED Final   Parainfluenza Virus 1 NOT DETECTED NOT DETECTED  Final   Parainfluenza Virus 2 NOT DETECTED NOT DETECTED Final   Parainfluenza Virus 3 NOT DETECTED NOT DETECTED Final   Parainfluenza Virus 4 NOT DETECTED NOT DETECTED Final   Respiratory Syncytial Virus NOT DETECTED NOT DETECTED Final   Bordetella pertussis NOT DETECTED NOT DETECTED Final   Chlamydophila pneumoniae NOT DETECTED NOT DETECTED Final   Mycoplasma pneumoniae NOT DETECTED NOT DETECTED Final    Comment: Performed at [facility], [place] [address], [place], [place] [address]  MRSA PCR Screening     Status: None   Collection Time: 10/06/19  3:41 AM   Specimen: Nasopharyngeal  Result Value Ref Range Status   MRSA by PCR NEGATIVE NEGATIVE Final    Comment:        The GeneXpert MRSA Assay (FDA approved for NASAL specimens only), is one component of a comprehensive MRSA colonization surveillance program. It is not intended to diagnose MRSA infection nor to guide or monitor treatment for MRSA infections. Performed at [facility], [address] [place], [place] [address]   CULTURE, BLOOD (ROUTINE X 2) w Reflex to ID Panel     Status: None (Preliminary result)   Collection Time: 10/07/19  1:17 AM   Specimen: BLOOD  Result Value Ref Range Status   Specimen Description BLOOD LEFT HAND  Final   Special Requests   Final    BOTTLES DRAWN AEROBIC AND ANAEROBIC Blood Culture adequate volume   Culture   Final    NO GROWTH < 12 HOURS Performed at [facility], [address], [place], [place] [address]    Report Status PENDING  Incomplete  CULTURE, BLOOD (ROUTINE X 2) w Reflex to ID Panel     Status: None (Preliminary result)   Collection Time: 10/07/19  1:17 AM   Specimen: BLOOD  Result Value Ref Range Status   Specimen Description BLOOD LEFT WRIST  Final   Special Requests   Final    BOTTLES DRAWN AEROBIC AND ANAEROBIC Blood Culture adequate volume   Culture   Final    NO GROWTH < 12 HOURS Performed at [facility], [address],  [place], [place] [address]    Report Status PENDING  Incomplete          IMAGING    MR BRAIN W WO CONTRAST  Result Date: 10/06/2019 CLINICAL DATA:  Severe pain in the left ear. Headache. Altered mental status. Lethargy. EXAM: MRI HEAD WITHOUT AND WITH CONTRAST TECHNIQUE: Multiplanar, multiecho pulse sequences of the brain and surrounding structures were obtained without and with intravenous contrast. CONTRAST:  31m GADAVIST GADOBUTROL 1 MMOL/ML IV SOLN COMPARISON:  Head CT yesterday. FINDINGS: Brain: There are 3 and possibly 4 foci of abnormal restricted diffusion. There is a subcentimeter focus along the lateral margin of the occipital horn of the right lateral ventricle. There is linear restricted diffusion along the medial  margin the atrium of the left lateral ventricle. There is a 9 mm focus of restricted diffusion affecting the cortex of the posterior left temporal lobe. There may be a fourth focus at the posteromedial left parietal cortex. This is an unusual pattern. Elsewhere, there is a ordinary pattern of small-vessel disease within the cerebral hemispheric white matter. No large vessel territory infarction. No hydrocephalus. No extra-axial fluid collection. After contrast administration rapid sequences were done which show intravascular enhancement. I think this is probably an artifact of the pulse sequence of Luciana Axe, but this does raise the possibility of leptomeningeal enhancement as might be seen meningitis. One could question very subtle fluid fluid levels in the occipital horns of the lateral ventricles. Aside from meningitis, I think the differential diagnosis could include vasculitis and an unusual manifestation of embolic infarctions. Vascular: Major vessels at the base of the brain show flow. Skull and upper cervical spine: Negative Sinuses/Orbits: Clear/normal Other: None IMPRESSION: Unusual examination showing 3 or 4 small foci of restricted diffusion at the right lateral ventricle  occipital horn, medial ependymal wall of the atrium of the left lateral ventricle, superficial cortex of the left posterior temporal lobe, and possibly the superficial cortex at the left parietal vertex. Question abnormal intravascular enhancement versus artifact of rapid pulse sequence. Question subtle fluid fluid levels in the occipital horns of the lateral ventricles. Diagnoses I would consider include sequela of meningitis, vasculitis or unusual manifestation of embolic infarction. I think lumbar puncture should be considered. Electronically Signed   By: Nelson Chimes M.D.   On: 10/06/2019 15:48   MR NECK W WO CONTRAST  Result Date: 10/06/2019 CLINICAL DATA:  Altered mental status. Sepsis. Presented yesterday with left ear pain EXAM: MRI OF THE NECK WITH CONTRAST TECHNIQUE: Multiplanar, multisequence MR imaging was performed following the administration of intravenous contrast. CONTRAST:  59m GADAVIST GADOBUTROL 1 MMOL/ML IV SOLN COMPARISON:  None. FINDINGS: Pharynx and larynx: Patient intubated. Hypopharynx is deviated towards the left because of the intubation. No primary lesion seen. Salivary glands: Negative Thyroid: Negative Lymph nodes: None Vascular: Jugular veins are patent. Arterial structures difficult to evaluate but appear to show flow. Limited intracranial: See results of brain MRI. Visualized orbits: Not included. Mastoids and visualized paranasal sinuses: See results of brain MRI. Skeleton: Negative.  No evidence of spinal infection. Upper chest: Negative Other: None IMPRESSION: Endotracheal intubation. Orogastric tube placement. No evidence of intrinsic primary pathology in the neck. Note that this does not represent a vascular evaluation but a soft tissue evaluation of the neck. Electronically Signed   By: MNelson ChimesM.D.   On: 10/06/2019 15:51   CBC    Component Value Date/Time   WBC 25.1 (H) 10/07/2019 0117   RBC 4.27 10/07/2019 0117   HGB 11.9 (L) 10/07/2019 0117   HCT 37.1  10/07/2019 0117   PLT 356 10/07/2019 0117   MCV 86.9 10/07/2019 0117   MCH 27.9 10/07/2019 0117   MCHC 32.1 10/07/2019 0117   RDW 15.9 (H) 10/07/2019 0117   LYMPHSABS 1.3 10/07/2019 0117   MONOABS 0.8 10/07/2019 0117   EOSABS 0.0 10/07/2019 0117   BASOSABS 0.0 10/07/2019 0117   BMP Latest Ref Rng & Units 10/07/2019 10/06/2019 10/05/2019  Glucose 70 - 99 mg/dL 280(H) 352(H) 349(H)  BUN 6 - 20 mg/dL '19 8 9  '$ Creatinine 0.44 - 1.00 mg/dL 1.12(H) 0.69 0.77  Sodium 135 - 145 mmol/L 140 139 138  Potassium 3.5 - 5.1 mmol/L 4.2 3.4(L) 4.3  Chloride 98 -  111 mmol/L 106 106 104  CO2 22 - 32 mmol/L 26 21(L) 20(L)  Calcium 8.9 - 10.3 mg/dL 8.4(L) 9.0 9.3       Indwelling Urinary Catheter continued, requirement due to   Reason to continue Indwelling Urinary Catheter strict Intake/Output monitoring for hemodynamic instability         Ventilator continued, requirement due to severe respiratory failure   Ventilator Sedation RASS 0 to -2      ASSESSMENT AND PLAN SYNOPSIS   Severe ACUTE Hypoxic and Hypercapnic Respiratory Failure from severe sepsis and severe meningitis with STREP PNEUMONIA LEADING to severe toxic metabolic encephalopathy -continue Full MV support -continue Bronchodilator Therapy -Wean Fio2 and PEEP as tolerated -will perform SAT/SBT when respiratory parameters are met  SEVERE COPD EXACERBATION -continue IV steroids as prescribed -continue NEB THERAPY as prescribed  ACUTE KIDNEY INJURY/Renal Failure -follow chem 7 -follow UO -continue Foley Catheter-assess need -Avoid nephrotoxic agents -Recheck creatinine    NEUROLOGY Wean sedation and assess NEURO STATUS Follow up NEURO RECS Repeat CT head to assess for mastoiditis    CARDIAC ICU monitoring  ID -continue IV abx as prescibed -follow up cultures  ANTIMICROBIALS: Cefepime x1 dose 01/24 Metronidazole x1 dose 01/24 Ceftriaxone 01/25>> Ampicillin 01/25>>1/26 Vancomycin 01/25>>   GI GI  PROPHYLAXIS as indicated  NUTRITIONAL STATUS DIET-->TF's as tolerated Constipation protocol as indicated  ENDO - will use ICU hypoglycemic\Hyperglycemia protocol if indicated   ELECTROLYTES -follow labs as needed -replace as needed -pharmacy consultation and following   DVT/GI PRX ordered TRANSFUSIONS AS NEEDED MONITOR FSBS ASSESS the need for LABS as needed   Critical Care Time devoted to patient care services described in this note is 36 minutes.   Overall, patient is critically ill, prognosis is guarded.  Patient with Multiorgan failure and at high risk for cardiac arrest and death.    Corrin Parker, M.D.  [facility]  Medical Director [place] Director [facility] Cardio-Pulmonary Department

## 2019-10-08 DIAGNOSIS — D72829 Elevated white blood cell count, unspecified: Secondary | ICD-10-CM

## 2019-10-08 DIAGNOSIS — R400 Somnolence: Secondary | ICD-10-CM

## 2019-10-08 LAB — GLUCOSE, CAPILLARY
Glucose-Capillary: 117 mg/dL — ABNORMAL HIGH (ref 70–99)
Glucose-Capillary: 129 mg/dL — ABNORMAL HIGH (ref 70–99)
Glucose-Capillary: 133 mg/dL — ABNORMAL HIGH (ref 70–99)
Glucose-Capillary: 150 mg/dL — ABNORMAL HIGH (ref 70–99)
Glucose-Capillary: 156 mg/dL — ABNORMAL HIGH (ref 70–99)
Glucose-Capillary: 171 mg/dL — ABNORMAL HIGH (ref 70–99)
Glucose-Capillary: 182 mg/dL — ABNORMAL HIGH (ref 70–99)
Glucose-Capillary: 200 mg/dL — ABNORMAL HIGH (ref 70–99)
Glucose-Capillary: 203 mg/dL — ABNORMAL HIGH (ref 70–99)
Glucose-Capillary: 211 mg/dL — ABNORMAL HIGH (ref 70–99)
Glucose-Capillary: 213 mg/dL — ABNORMAL HIGH (ref 70–99)
Glucose-Capillary: 221 mg/dL — ABNORMAL HIGH (ref 70–99)
Glucose-Capillary: 233 mg/dL — ABNORMAL HIGH (ref 70–99)
Glucose-Capillary: 252 mg/dL — ABNORMAL HIGH (ref 70–99)

## 2019-10-08 LAB — COMPREHENSIVE METABOLIC PANEL WITH GFR
ALT: 23 U/L (ref 0–44)
AST: 21 U/L (ref 15–41)
Albumin: 2.7 g/dL — ABNORMAL LOW (ref 3.5–5.0)
Alkaline Phosphatase: 55 U/L (ref 38–126)
Anion gap: 7 (ref 5–15)
BUN: 25 mg/dL — ABNORMAL HIGH (ref 6–20)
CO2: 27 mmol/L (ref 22–32)
Calcium: 9.1 mg/dL (ref 8.9–10.3)
Chloride: 114 mmol/L — ABNORMAL HIGH (ref 98–111)
Creatinine, Ser: 0.9 mg/dL (ref 0.44–1.00)
GFR calc Af Amer: >60 mL/min
GFR calc non Af Amer: >60 mL/min
Glucose, Bld: 222 mg/dL — ABNORMAL HIGH (ref 70–99)
Potassium: 3.9 mmol/L (ref 3.5–5.1)
Sodium: 148 mmol/L — ABNORMAL HIGH (ref 135–145)
Total Bilirubin: 0.5 mg/dL (ref 0.3–1.2)
Total Protein: 7 g/dL (ref 6.5–8.1)

## 2019-10-08 LAB — CULTURE, BLOOD (ROUTINE X 2)

## 2019-10-08 LAB — CBC WITH DIFFERENTIAL/PLATELET
Abs Immature Granulocytes: 0.44 10*3/uL — ABNORMAL HIGH (ref 0.00–0.07)
Basophils Absolute: 0 10*3/uL (ref 0.0–0.1)
Basophils Relative: 0 %
Eosinophils Absolute: 0 10*3/uL (ref 0.0–0.5)
Eosinophils Relative: 0 %
HCT: 34.3 % — ABNORMAL LOW (ref 36.0–46.0)
Hemoglobin: 10.9 g/dL — ABNORMAL LOW (ref 12.0–15.0)
Immature Granulocytes: 2 %
Lymphocytes Relative: 5 %
Lymphs Abs: 1.1 10*3/uL (ref 0.7–4.0)
MCH: 27.9 pg (ref 26.0–34.0)
MCHC: 31.8 g/dL (ref 30.0–36.0)
MCV: 87.7 fL (ref 80.0–100.0)
Monocytes Absolute: 0.6 10*3/uL (ref 0.1–1.0)
Monocytes Relative: 3 %
Neutro Abs: 17.6 10*3/uL — ABNORMAL HIGH (ref 1.7–7.7)
Neutrophils Relative %: 90 %
Platelets: 343 10*3/uL (ref 150–400)
RBC: 3.91 MIL/uL (ref 3.87–5.11)
RDW: 16.6 % — ABNORMAL HIGH (ref 11.5–15.5)
WBC: 19.6 10*3/uL — ABNORMAL HIGH (ref 4.0–10.5)
nRBC: 0.2 % (ref 0.0–0.2)

## 2019-10-08 LAB — VDRL, CSF: VDRL Quant, CSF: NONREACTIVE

## 2019-10-08 LAB — HSV DNA BY PCR (REFERENCE LAB)
HSV 1 DNA: NEGATIVE
HSV 2 DNA: NEGATIVE

## 2019-10-08 LAB — PATHOLOGIST SMEAR REVIEW

## 2019-10-08 LAB — MAGNESIUM: Magnesium: 3 mg/dL — ABNORMAL HIGH (ref 1.7–2.4)

## 2019-10-08 LAB — PHOSPHORUS: Phosphorus: 4 mg/dL (ref 2.5–4.6)

## 2019-10-08 MED ORDER — SODIUM CHLORIDE 0.9% FLUSH: 10.0000 mL | INTRAVENOUS | Status: DC | PRN

## 2019-10-08 MED ORDER — POLYETHYLENE GLYCOL 3350 17 G PO PACK
17.0000 g | PACK | Freq: Every day | ORAL | Status: DC
  Administered 2019-10-08 – 2019-10-09 (×2): 17 g
  Filled 2019-10-08 (×2): qty 1

## 2019-10-08 NOTE — Progress Notes (Signed)
ID Intubated and sedated  BP (!) 100/58   Pulse 94   Temp 98.6 F (37 C) (Oral)   Resp 16   Ht 5\' 5"  (1.651 m)   Wt 101.8 kg   LMP 09/30/2014   SpO2 94%   BMI 37.35 kg/m    Chest b/l air entry HS tachycardia Foley NG tube Mildline left    CBC Latest Ref Rng & Units 10/08/2019 10/07/2019 10/05/2019  WBC 4.0 - 10.5 K/uL 19.6(H) 25.1(H) 19.0(H)  Hemoglobin 12.0 - 15.0 g/dL 10.9(L) 11.9(L) 13.5  Hematocrit 36.0 - 46.0 % 34.3(L) 37.1 41.0  Platelets 150 - 400 K/uL 343 356 438(H)    CMP Latest Ref Rng & Units 10/08/2019 10/07/2019 10/06/2019  Glucose 70 - 99 mg/dL 222(H) 280(H) 352(H)  BUN 6 - 20 mg/dL 25(H) 19 8  Creatinine 0.44 - 1.00 mg/dL 0.90 1.12(H) 0.69  Sodium 135 - 145 mmol/L 148(H) 140 139  Potassium 3.5 - 5.1 mmol/L 3.9 4.2 3.4(L)  Chloride 98 - 111 mmol/L 114(H) 106 106  CO2 22 - 32 mmol/L 27 26 21(L)  Calcium 8.9 - 10.3 mg/dL 9.1 8.4(L) 9.0  Total Protein 6.5 - 8.1 g/dL 7.0 - 7.7  Total Bilirubin 0.3 - 1.2 mg/dL 0.5 - 0.6  Alkaline Phos 38 - 126 U/L 55 - 80  AST 15 - 41 U/L 21 - 19  ALT 0 - 44 U/L 23 - 20   Micro 10/06/19 BC strep pneumo 10/07/19- BC NG so far 10/07/19 Csf HIV Neg  Antibiotics Cefepime x1 dose 01/24 Metronidazole x1 dose 01/24 Ceftriaxone 01/25>> Ampicillin 01/25>>1/26 Vancomycin 01/25>>10/08/19  Impression/recommendation  Pt presenting with 3 weeks of left ear pain and then developed pneumococcal bacteremia and purulent meningitis.  I  expected the mastoid/inner ear/sinus to have been the source and some changes to be seen on imaging needing surgery. she has a very high wbc in the csf 100K . As per [facility], seen the films and no evidence of any drainable source MIC of strep in the blood is ceftriaxone 0.25 CSF culture still pending Will continue ceftriaxone and DC vancomycin  Continue decadron for a total of 4 days   leucocytosis could be a combination of infection and steroid  Acute hypoxic resp failure-  intubated    Discussed the management with care team

## 2019-10-08 NOTE — Progress Notes (Signed)
Assisted tele visit to patient with family members.  Oris Staffieri M Otillia Cordone, RN   

## 2019-10-08 NOTE — Consult Note (Signed)
PHARMACY CONSULT NOTE - FOLLOW UP  Pharmacy Consult for Electrolyte Monitoring and Replacement   Recent Labs: Potassium (mmol/L)  Date Value  10/08/2019 3.9   Magnesium (mg/dL)  Date Value  10/08/2019 3.0 (H)   Calcium (mg/dL)  Date Value  10/08/2019 9.1   Albumin (g/dL)  Date Value  10/08/2019 2.7 (L)   Phosphorus (mg/dL)  Date Value  10/08/2019 4.0   Sodium (mmol/L)  Date Value  10/08/2019 148 (H)     Assessment: Pharmacy has been consulted to manage electrolytes for this 59 year-old female admitted and intubated in the ICU with pneumococcal bacteremia and purulent meningitis.  Patient has a PMH significant for prediabetes, hypertension, and COPD.  Renal function is improving and approaching baseline (Baseline Scr appears to be 0.77 to 0.91).    Electrolytes All electrolytes WNL Potassium dropping slightly, but no supplementation needed at this time.  Will continue to monitor potassium while patient is on insulin drip.   Electrolytes with AM labs.  Glucose:  A1c 8.2, and receiving IV dexamethasone.  SBG slowly dropping on insulin drip with D5LR @ 75 mL/hr.  SBG ranging in the 230s.  She is receiving Pro-Stat 60 mL BID per tube and VITAL High Protein at 25 mL/hour per tube.  Stop date for dexamethasone is tomorrow (1/28), and anticipate this will lead to improvement in SBG.  Plan to keep insulin drip until stopping dexamethasone.  Will continue to follow glucose control.       Bowel Regimen LBM unknown (admitted 1/25).  Currently on senna-docusate while sedated with fentanyl and propofol infusions.  Will start miralax.    Gerald Dexter ,PharmD 10/08/2019 12:56 PM

## 2019-10-08 NOTE — Consult Note (Signed)
Pharmacy Antibiotic Note  Cathy Hughes is a 59 y.o. female admitted on 10/05/2019 with pneumococcal bacteremia and purulent meningitis.  PMH is significant for prediabetes, hypertension, and COPD.  Pharmacy has been consulted for vancomycin dosing.  Patient originally presented to ED with complaint of left ear pain for 3 weeks, and later returned to ED the same day with headache and AMS.  Blood cultures (1/25) revealed Streptococcus pneumoniae in 4/4 bottles.  Lumbar puncture revealed CSF with 108,561 WBC and total protein of 477.  ID is currently following.    Patient is currently on antibiotics day 3.  Plan: BCx susceptibilities reveal Strep pneumoniae sensitivity to ceftriaxone.  Per ID recommendations, will stop vancomycin and continue ceftriaxone 2g BID.  Pharmacy will continue to follow ID recommendations in monitoring antibiotic regimen.     Height: 5\' 5"  (165.1 cm) Weight: 224 lb 6.9 oz (101.8 kg) IBW/kg (Calculated) : 57  Temp (24hrs), Avg:97.7 F (36.5 C), Min:97.4 F (36.3 C), Max:98 F (36.7 C)  Recent Labs  Lab 10/05/19 2237 10/06/19 0013 10/06/19 0435 10/07/19 0117 10/08/19 0553  WBC 19.0*  --   --  25.1* 19.6*  CREATININE 0.77  --  0.69 1.12* 0.90  LATICACIDVEN 3.7* 4.3* 2.5*  --   --     Estimated Creatinine Clearance: 82.5 mL/min (by C-G formula based on SCr of 0.9 mg/dL).    Allergies  Allergen Reactions  . Aspirin Hives  . Tramadol Other (See Comments)    Headache   . Tylenol [Acetaminophen] Hives    Antimicrobials this admission: 1/25 Ampicillin >> 1/25 1/25 Cefepime >> 1/25 1/25 Metronidazole >> 1/25 1/25 Vancomycin >> 1/27 1/25 Ceftriaxone >>   Dose adjustments this admission: 1/26 Vancomycin 1000 mg BID >> 1500 mg q24 hours  Microbiology results: 1/26 BCx:  NG x 1 day 1/26 CSF Fungal Cx w/ stain:  sent 1/26 CSF Cx:  NG x 1 day, still pending 1/25 BCx: Streptococcus pneumoniae (4/4 bottles) 1/25 UCx: NG 1/25 MRSA PCR: negative 1/25  SARS Coronavirus 2 (swab): negative 1/25 Respiratory panel: negative  Thank you for allowing pharmacy to be a part of this patient's care.  Gerald Dexter, PharmD  10/08/2019 11:35 AM

## 2019-10-08 NOTE — Progress Notes (Signed)
CRITICAL CARE NOTE BRIEF PATIENT DESCRIPTION: 59 yo female admitted with acute respiratory failure secondary to metabolic acidosis, acute toxic metabolic encephalopathy, and sepsis etiology secondary to meningitis requiring mechanical intubation  SIGNIFICANT EVENTS/STUDIES: 01/24: Pt arrived to the ER with c/o headache after taking oxycodone requiring admission to the stepdown unit 01/24: CT Head revealed no acute intracranial findings 01/25: Pt became minimally responsive after receiving low dose morphine and ativan, therefore PCCM consulted to assist with treatment plan. Pt subsequently required mechanical intubation due to acute respiratory failure  1/25 3/4 blood cultures +STREP PNEUMONIA, husband updated 1/26 status post LP status post CT of the head status post MRI Remains on the vent, brain infection Daughter and husband updated  MICRO DATA: Rapid COVID-19 01/24>>negative  Blood x2 01/25>> Urine 01/25>> COVID-1901/24>>  ANTIMICROBIALS: Cefepime x1 dose 01/24 Metronidazole x1 dose 01/24 Ceftriaxone 01/25>> Ampicillin 01/25>>1/26 Vancomycin 01/25>>    CC  follow up respiratory failure  SUBJECTIVE Patient remains critically ill Prognosis is guarded   BP (!) 101/57   Pulse 82   Temp 97.7 F (36.5 C) (Axillary)   Resp 18   Ht '5\' 5"'$  (1.651 m)   Wt 101.8 kg   LMP 09/30/2014   SpO2 97%   BMI 37.35 kg/m    I/O last 3 completed shifts: In: 6717.1 [I.V.:5142.1; NG/GT:875; IV Piggyback:700] Out: 2275 [Urine:2275] No intake/output data recorded.  SpO2: 97 % O2 Flow Rate (L/min): 2 L/min FiO2 (%): 55 %     REVIEW OF SYSTEMS  PATIENT IS UNABLE TO PROVIDE COMPLETE REVIEW OF SYSTEMS DUE TO SEVERE CRITICAL ILLNESS   PHYSICAL EXAMINATION:  GENERAL:critically ill appearing, +resp distress HEAD: Normocephalic, atraumatic.  EYES: Pupils equal, round, reactive to light.  No scleral icterus.  MOUTH: Moist mucosal membrane. NECK: Supple.  PULMONARY:  +rhonchi, +wheezing CARDIOVASCULAR: S1 and S2. Regular rate and rhythm. No murmurs, rubs, or gallops.  GASTROINTESTINAL: Soft, nontender, -distended.  Positive bowel sounds.   MUSCULOSKELETAL: No swelling, clubbing, or edema.  NEUROLOGIC: obtunded, GCS<8 SKIN:intact,warm,dry  MEDICATIONS: I have reviewed all medications and confirmed regimen as documented   CULTURE RESULTS   Recent Results (from the past 240 hour(s))  Culture, blood (routine x 2)     Status: None (Preliminary result)   Collection Time: 10/06/19 12:13 AM   Specimen: BLOOD  Result Value Ref Range Status   Specimen Description   Final    BLOOD LEFT HAND Performed at [facility], [address], [place], [place] [address]    Special Requests   Final    BOTTLES DRAWN AEROBIC AND ANAEROBIC Blood Culture results may not be optimal due to an inadequate volume of blood received in culture bottles Performed at [facility], [address] [place], [place] [address]    Culture  Setup Time   Final    GRAM POSITIVE COCCI IN BOTH AEROBIC AND ANAEROBIC BOTTLES CRITICAL RESULT CALLED TO, READ BACK BY AND VERIFIED WITH: Rito Ehrlich ON 10/06/19 AT 10 QSD Performed at [facility], [place] [address], [place], [place] [address]    Culture GRAM POSITIVE COCCI  Final   Report Status PENDING  Incomplete  Culture, blood (routine x 2)     Status: Abnormal (Preliminary result)   Collection Time: 10/06/19 12:13 AM   Specimen: BLOOD  Result Value Ref Range Status   Specimen Description   Final    BLOOD RIGHT HAND Performed at [facility], [address], [place], [place] [address]    Special Requests  Final    BOTTLES DRAWN AEROBIC AND ANAEROBIC Blood Culture results may not be optimal due to an inadequate volume of blood received in culture bottles Performed at [facility], [address] [place], [place] [address]    Culture  Setup Time   Final    GRAM POSITIVE COCCI IN BOTH  AEROBIC AND ANAEROBIC BOTTLES CRITICAL RESULT CALLED TO, READ BACK BY AND VERIFIED WITH: LISA KLUTTZ ON 10/06/19 AT 1032 QSD Performed at [facility], [address] [place], [place] [address]    Culture (A)  Final    STREPTOCOCCUS PNEUMONIAE SUSCEPTIBILITIES TO FOLLOW Performed at [facility], [place] [address], [place], [place] [address]    Report Status PENDING  Incomplete  Blood Culture ID Panel (Reflexed)     Status: Abnormal   Collection Time: 10/06/19 12:13 AM  Result Value Ref Range Status   Enterococcus species NOT DETECTED NOT DETECTED Final   Listeria monocytogenes NOT DETECTED NOT DETECTED Final   Staphylococcus species NOT DETECTED NOT DETECTED Final   Staphylococcus aureus (BCID) NOT DETECTED NOT DETECTED Final   Streptococcus species DETECTED (A) NOT DETECTED Final    Comment: CRITICAL RESULT CALLED TO, READ BACK BY AND VERIFIED WITH: WALID NAZARI ON 10/06/19 AT 1225 QSD    Streptococcus agalactiae NOT DETECTED NOT DETECTED Final   Streptococcus pneumoniae DETECTED (A) NOT DETECTED Final    Comment: CRITICAL RESULT CALLED TO, READ BACK BY AND VERIFIED WITH: WALID NAZARI ON 10/06/19 AT 1225 QSD    Streptococcus pyogenes NOT DETECTED NOT DETECTED Final   Acinetobacter baumannii NOT DETECTED NOT DETECTED Final   Enterobacteriaceae species NOT DETECTED NOT DETECTED Final   Enterobacter cloacae complex NOT DETECTED NOT DETECTED Final   Escherichia coli NOT DETECTED NOT DETECTED Final   Klebsiella oxytoca NOT DETECTED NOT DETECTED Final   Klebsiella pneumoniae NOT DETECTED NOT DETECTED Final   Proteus species NOT DETECTED NOT DETECTED Final   Serratia marcescens NOT DETECTED NOT DETECTED Final   Haemophilus influenzae NOT DETECTED NOT DETECTED Final   Neisseria meningitidis NOT DETECTED NOT DETECTED Final   Pseudomonas aeruginosa NOT DETECTED NOT DETECTED Final   Candida albicans NOT DETECTED NOT DETECTED Final   Candida glabrata NOT DETECTED NOT DETECTED  Final   Candida krusei NOT DETECTED NOT DETECTED Final   Candida parapsilosis NOT DETECTED NOT DETECTED Final   Candida tropicalis NOT DETECTED NOT DETECTED Final    Comment: Performed at [facility], [address] [place], [place] [address]  Urine Culture     Status: None   Collection Time: 10/06/19 12:51 AM   Specimen: Urine, Random  Result Value Ref Range Status   Specimen Description   Final    URINE, RANDOM Performed at [facility], [address], [place], [place] [address]    Special Requests   Final    NONE Performed at [facility], [address], [place], [place] [address]    Culture   Final    NO GROWTH Performed at [facility], [place] [address], [place], [place] [address]    Report Status 10/07/2019 FINAL  Final  SARS CORONAVIRUS 2 (TAT 6-24 HRS) Nasopharyngeal Nasopharyngeal Swab     Status: None   Collection Time: 10/06/19  2:30 AM   Specimen: Nasopharyngeal Swab  Result Value Ref Range Status   SARS Coronavirus 2 NEGATIVE NEGATIVE Final    Comment: (NOTE) SARS-CoV-2 target nucleic acids are NOT DETECTED. The SARS-CoV-2 RNA is generally  detectable in upper and lower respiratory specimens during the acute phase of infection. Negative results do not preclude SARS-CoV-2 infection, do not rule out co-infections with other pathogens, and should not be used as the sole basis for treatment or other patient management decisions. Negative results must be combined with clinical observations, patient history, and epidemiological information. The expected result is Negative. Fact Sheet for Patients: SugarRoll.be Fact Sheet for Healthcare Providers: https://www.woods-mathews.com/ This test is not yet approved or cleared by the [place] FDA and  has been authorized for detection and/or diagnosis of SARS-CoV-2 by FDA under an Emergency Use Authorization (EUA). This EUA will remain  in  effect (meaning this test can be used) for the duration of the COVID-19 declaration under Section 56 4(b)(1) of the Act, 21 U.S.C. section 360bbb-3(b)(1), unless the authorization is terminated or revoked sooner. Performed at [facility], [place] [address] [place], [place] [address]   Respiratory Panel by PCR     Status: None   Collection Time: 10/06/19  3:40 AM   Specimen: Nasopharyngeal Swab; Respiratory  Result Value Ref Range Status   Adenovirus NOT DETECTED NOT DETECTED Final   Coronavirus 229E NOT DETECTED NOT DETECTED Final    Comment: (NOTE) The Coronavirus on the Respiratory Panel, DOES NOT test for the novel  Coronavirus (2019 nCoV)    Coronavirus HKU1 NOT DETECTED NOT DETECTED Final   Coronavirus NL63 NOT DETECTED NOT DETECTED Final   Coronavirus OC43 NOT DETECTED NOT DETECTED Final   Metapneumovirus NOT DETECTED NOT DETECTED Final   Rhinovirus / Enterovirus NOT DETECTED NOT DETECTED Final   Influenza A NOT DETECTED NOT DETECTED Final   Influenza B NOT DETECTED NOT DETECTED Final   Parainfluenza Virus 1 NOT DETECTED NOT DETECTED Final   Parainfluenza Virus 2 NOT DETECTED NOT DETECTED Final   Parainfluenza Virus 3 NOT DETECTED NOT DETECTED Final   Parainfluenza Virus 4 NOT DETECTED NOT DETECTED Final   Respiratory Syncytial Virus NOT DETECTED NOT DETECTED Final   Bordetella pertussis NOT DETECTED NOT DETECTED Final   Chlamydophila pneumoniae NOT DETECTED NOT DETECTED Final   Mycoplasma pneumoniae NOT DETECTED NOT DETECTED Final    Comment: Performed at [facility], [place]. [address], [place], [place] [address]  MRSA PCR Screening     Status: None   Collection Time: 10/06/19  3:41 AM   Specimen: Nasopharyngeal  Result Value Ref Range Status   MRSA by PCR NEGATIVE NEGATIVE Final    Comment:        The GeneXpert MRSA Assay (FDA approved for NASAL specimens only), is one component of a comprehensive MRSA colonization surveillance program. It is not intended  to diagnose MRSA infection nor to guide or monitor treatment for MRSA infections. Performed at [facility], [address] [place], [place] [address]   CULTURE, BLOOD (ROUTINE X 2) w Reflex to ID Panel     Status: None (Preliminary result)   Collection Time: 10/07/19  1:17 AM   Specimen: BLOOD  Result Value Ref Range Status   Specimen Description BLOOD LEFT HAND  Final   Special Requests   Final    BOTTLES DRAWN AEROBIC AND ANAEROBIC Blood Culture adequate volume   Culture   Final    NO GROWTH 1 DAY Performed at [facility], [address] [place], [place] [address]    Report Status PENDING  Incomplete  CULTURE, BLOOD (ROUTINE X 2) w Reflex to ID Panel     Status: None (Preliminary  result)   Collection Time: 10/07/19  1:17 AM   Specimen: BLOOD  Result Value Ref Range Status   Specimen Description BLOOD LEFT WRIST  Final   Special Requests   Final    BOTTLES DRAWN AEROBIC AND ANAEROBIC Blood Culture adequate volume   Culture   Final    NO GROWTH 1 DAY Performed at [facility], [address], [place], [place] [address]    Report Status PENDING  Incomplete  CSF culture     Status: None (Preliminary result)   Collection Time: 10/07/19  1:22 PM   Specimen: PATH Cytology CSF; Cerebrospinal Fluid  Result Value Ref Range Status   Specimen Description CSF  Final   Special Requests NONE  Final   Gram Stain   Final    NO ORGANISMS SEEN WBC SEEN RED BLOOD CELLS RESULT CALLED TO, READ BACK BY AND VERIFIED WITH: MEGAN SCHEFFIELD 10/07/19 @ 1526  [place] Performed at [facility], [address], [place], [place] [address]    Culture PENDING  Incomplete   Report Status PENDING  Incomplete          IMAGING    EEG  Result Date: 10/07/2019 Lora Havens, MD     10/07/2019  9:36 AM Patient Name: LIRIDONA MASHAW MRN: [id number] Epilepsy Attending: Lora Havens Referring Physician/Provider: Dr Marda Stalker, NP Date: 10/06/2019  Duration: 21.18 mins Patient history: 59yo F with AMS. EEG to evaluate for seizure. Level of alertness: comatose AEDs during EEG study: propofol Technical aspects: This EEG study was done with scalp electrodes positioned according to the 10-20 International system of electrode placement. Electrical activity was acquired at a sampling rate of '500Hz'$  and reviewed with a high frequency filter of '70Hz'$  and a low frequency filter of '1Hz'$ . EEG data were recorded continuously and digitally stored. DESCRIPTION: EEG showed continuous generalized polymorphic 2-'3Hz'$  delta slowing as well as 9-10 generalized alpha activity. There is also 15 to 18 Hz, 2-3 uV beta activity, maximal frontocentral. EEG was reactive to tactile stimulation. Hyperventilation and photic stimulation were not performed. ABNORMALITY - Continuous slow, generalized IMPRESSION: This study is suggestive of severe diffuse encephalopathy, non specific to etiology but could be secondary to sedation. No seizures or epileptiform discharges were seen throughout the recording. Priyanka Barbra Sarks   CT ANGIO HEAD W OR WO CONTRAST  Result Date: 10/07/2019 CLINICAL DATA:  Stroke, follow-up. Additional history provided: Past medical history significant for pre diabetes, hypertension, COPD, nicotine dependence, perennial rhinitis, complaint of severe left ear pain, headache, subsequent development of altered mental status EXAM: CT ANGIOGRAPHY HEAD AND NECK TECHNIQUE: Multidetector CT imaging of the head and neck was performed using the standard protocol during bolus administration of intravenous contrast. Multiplanar CT image reconstructions and MIPs were obtained to evaluate the vascular anatomy. Carotid stenosis measurements (when applicable) are obtained utilizing NASCET criteria, using the distal internal carotid diameter as the denominator. CONTRAST:  26m OMNIPAQUE IOHEXOL 350 MG/ML SOLN COMPARISON:  MRI head and neck 10/06/2019, head CT 10/05/2019 FINDINGS: CT HEAD  FINDINGS Brain: No acute infarct is appreciable by CT. No evidence of intracranial hemorrhage. No evidence of intracranial mass. No midline shift or extra-axial fluid collection. Mild ill-defined hypoattenuation within the cerebral white matter is nonspecific, but consistent with chronic small vessel ischemic disease. Cerebral volume is normal for age. Redemonstrated cavum septum pellucidum and cavum vergae. Vascular: Reported separately Skull: Normal. Negative for fracture or focal lesion. Sinuses: Secretions within the posterior nasopharynx. Air-fluid levels within the  midline sphenoid sinus. Partial opacification of left-sided ethmoid air cells. Findings are nonspecific in the setting of life-support tubes. No significant mastoid effusion. Orbits: Visualized orbits demonstrate no acute abnormality. Review of the MIP images confirms the above findings CTA NECK FINDINGS Aortic arch: Standard aortic branching. Calcified plaque within the visualized aortic arch and proximal major branch vessels of the neck. No high-grade innominate or proximal subclavian stenosis Right carotid system: Streak artifact from a dense right-sided contrast bolus slightly limits evaluation of the proximal right common carotid artery.CCA and ICA patent within the neck without stenosis. Mild soft plaque versus carotid web within the posterior aspect of the right carotid bulb (series 12, image 92). Tortuosity of the distal cervical ICA. Left carotid system: CCA and ICA patent within the neck without stenosis mild mixed plaque within the carotid bulb. Slight tortuosity of the cervical ICA. Vertebral arteries: Codominant. Streak artifact from a dense right-sided contrast bolus and motion degradation somewhat limit evaluation of the right vertebral artery origin and proximal V1 segment. The left vertebral artery is patent within the neck without stenosis. Skeleton: No acute bony abnormality. Cervical spondylosis. A somewhat prominent C5-C6  posterior disc osteophyte complex contributes to at least mild/moderate spinal canal stenosis. There is significant narrowing of the right vertebral artery at the level of the C5 transverse foramen due to mass effect from adjacent osteophytes. Otherwise, no significant stenosis is identified within the cervical right vertebral artery. Other neck: No neck mass or cervical lymphadenopathy. Upper chest: Centrilobular and paraseptal emphysema within the partially imaged lungs. There is partially imaged ill-defined airspace opacity within the dependent right lung apex. An ET tube terminates at the level of the carina, possibly extending just slightly into the right mainstem bronchus. Review of the MIP images confirms the above findings CTA HEAD FINDINGS Anterior circulation: The intracranial internal carotid arteries are patent without stenosis. The bilateral middle and anterior cerebral arteries are patent without proximal branch occlusion or high-grade proximal stenosis. No intracranial aneurysm is identified Posterior circulation: The intracranial vertebral arteries are patent bilaterally. Moderately narrowed appearance of the distal intracranial right vertebral artery may be developmental or may reflect an acquired stenosis. No significant stenosis within the intracranial left vertebral artery or basilar artery. The bilateral posterior cerebral arteries are patent without significant proximal stenosis. Posterior communicating arteries are poorly delineated and may be hypoplastic or absent bilaterally. Venous sinuses: Poorly assessed due to contrast timing. Anatomic variants: As described. Review of the MIP images confirms the above findings Impression #3 under the CTA neck findings section will be called to the ordering clinician or representative by the Radiologist Assistant, and communication documented in the PACS or zVision Dashboard. IMPRESSION: CT head: 1. No CT evidence of acute intracranial abnormality.  Please refer to MRI 10/06/2019 for a description of small foci of restricted diffusion within the brain. 2. Mild chronic small vessel ischemic disease. CTA neck: 1. The bilateral common and internal carotid arteries are patent within the neck without stenosis. Mild soft plaque versus carotid web present within the posterior right carotid bulb. Mild mixed plaque within the left carotid bulb. 2. Significant narrowing of the right vertebral artery at the C5 level due to mass effect from adjacent osteophytes. he vertebral arteries are otherwise patent within the neck without significant stenosis. 3. The ET tube terminates at the level of the carina and may extend just slightly into the proximal right mainstem bronchus. Retraction is advised. 4. Partially visualized airspace opacities within the dependent right upper lobe. Consider dedicated  chest CT for further evaluation CTA head: 1. No intracranial large vessel occlusion or proximal high-grade arterial stenosis. 2. Moderate smooth narrowing of the distal intracranial right vertebral artery may be developmental or may reflect an acquired stenosis. Electronically Signed   By: Kellie Simmering DO   On: 10/07/2019 13:51   CT ANGIO NECK W OR WO CONTRAST  Result Date: 10/07/2019 CLINICAL DATA:  Stroke, follow-up. Additional history provided: Past medical history significant for pre diabetes, hypertension, COPD, nicotine dependence, perennial rhinitis, complaint of severe left ear pain, headache, subsequent development of altered mental status EXAM: CT ANGIOGRAPHY HEAD AND NECK TECHNIQUE: Multidetector CT imaging of the head and neck was performed using the standard protocol during bolus administration of intravenous contrast. Multiplanar CT image reconstructions and MIPs were obtained to evaluate the vascular anatomy. Carotid stenosis measurements (when applicable) are obtained utilizing NASCET criteria, using the distal internal carotid diameter as the denominator.  CONTRAST:  70m OMNIPAQUE IOHEXOL 350 MG/ML SOLN COMPARISON:  MRI head and neck 10/06/2019, head CT 10/05/2019 FINDINGS: CT HEAD FINDINGS Brain: No acute infarct is appreciable by CT. No evidence of intracranial hemorrhage. No evidence of intracranial mass. No midline shift or extra-axial fluid collection. Mild ill-defined hypoattenuation within the cerebral white matter is nonspecific, but consistent with chronic small vessel ischemic disease. Cerebral volume is normal for age. Redemonstrated cavum septum pellucidum and cavum vergae. Vascular: Reported separately Skull: Normal. Negative for fracture or focal lesion. Sinuses: Secretions within the posterior nasopharynx. Air-fluid levels within the midline sphenoid sinus. Partial opacification of left-sided ethmoid air cells. Findings are nonspecific in the setting of life-support tubes. No significant mastoid effusion. Orbits: Visualized orbits demonstrate no acute abnormality. Review of the MIP images confirms the above findings CTA NECK FINDINGS Aortic arch: Standard aortic branching. Calcified plaque within the visualized aortic arch and proximal major branch vessels of the neck. No high-grade innominate or proximal subclavian stenosis Right carotid system: Streak artifact from a dense right-sided contrast bolus slightly limits evaluation of the proximal right common carotid artery.CCA and ICA patent within the neck without stenosis. Mild soft plaque versus carotid web within the posterior aspect of the right carotid bulb (series 12, image 92). Tortuosity of the distal cervical ICA. Left carotid system: CCA and ICA patent within the neck without stenosis mild mixed plaque within the carotid bulb. Slight tortuosity of the cervical ICA. Vertebral arteries: Codominant. Streak artifact from a dense right-sided contrast bolus and motion degradation somewhat limit evaluation of the right vertebral artery origin and proximal V1 segment. The left vertebral artery is  patent within the neck without stenosis. Skeleton: No acute bony abnormality. Cervical spondylosis. A somewhat prominent C5-C6 posterior disc osteophyte complex contributes to at least mild/moderate spinal canal stenosis. There is significant narrowing of the right vertebral artery at the level of the C5 transverse foramen due to mass effect from adjacent osteophytes. Otherwise, no significant stenosis is identified within the cervical right vertebral artery. Other neck: No neck mass or cervical lymphadenopathy. Upper chest: Centrilobular and paraseptal emphysema within the partially imaged lungs. There is partially imaged ill-defined airspace opacity within the dependent right lung apex. An ET tube terminates at the level of the carina, possibly extending just slightly into the right mainstem bronchus. Review of the MIP images confirms the above findings CTA HEAD FINDINGS Anterior circulation: The intracranial internal carotid arteries are patent without stenosis. The bilateral middle and anterior cerebral arteries are patent without proximal branch occlusion or high-grade proximal stenosis. No intracranial aneurysm is identified  Posterior circulation: The intracranial vertebral arteries are patent bilaterally. Moderately narrowed appearance of the distal intracranial right vertebral artery may be developmental or may reflect an acquired stenosis. No significant stenosis within the intracranial left vertebral artery or basilar artery. The bilateral posterior cerebral arteries are patent without significant proximal stenosis. Posterior communicating arteries are poorly delineated and may be hypoplastic or absent bilaterally. Venous sinuses: Poorly assessed due to contrast timing. Anatomic variants: As described. Review of the MIP images confirms the above findings Impression #3 under the CTA neck findings section will be called to the ordering clinician or representative by the Radiologist Assistant, and  communication documented in the PACS or zVision Dashboard. IMPRESSION: CT head: 1. No CT evidence of acute intracranial abnormality. Please refer to MRI 10/06/2019 for a description of small foci of restricted diffusion within the brain. 2. Mild chronic small vessel ischemic disease. CTA neck: 1. The bilateral common and internal carotid arteries are patent within the neck without stenosis. Mild soft plaque versus carotid web present within the posterior right carotid bulb. Mild mixed plaque within the left carotid bulb. 2. Significant narrowing of the right vertebral artery at the C5 level due to mass effect from adjacent osteophytes. he vertebral arteries are otherwise patent within the neck without significant stenosis. 3. The ET tube terminates at the level of the carina and may extend just slightly into the proximal right mainstem bronchus. Retraction is advised. 4. Partially visualized airspace opacities within the dependent right upper lobe. Consider dedicated chest CT for further evaluation CTA head: 1. No intracranial large vessel occlusion or proximal high-grade arterial stenosis. 2. Moderate smooth narrowing of the distal intracranial right vertebral artery may be developmental or may reflect an acquired stenosis. Electronically Signed   By: Kellie Simmering DO   On: 10/07/2019 13:51   DG Chest Port 1 View  Result Date: 10/07/2019 CLINICAL DATA:  Endotracheal tube placement.  Meningitis. EXAM: PORTABLE CHEST 1 VIEW COMPARISON:  10/06/2019 FINDINGS: Endotracheal tube tip 5 cm above the carina. Orogastric or nasogastric tube enters the stomach. Patchy atelectasis at the lung bases is slightly more pronounced. Peripheral emphysematous change in the upper lungs is evident, right more than left. IMPRESSION: Endotracheal tube tip 5 cm above the carina. Slight worsening of basilar atelectasis. Electronically Signed   By: Nelson Chimes M.D.   On: 10/07/2019 15:15   ECHOCARDIOGRAM COMPLETE  Result Date:  10/07/2019   ECHOCARDIOGRAM REPORT   Patient Name:   KEARSTEN GINTHER Date of Exam: 10/07/2019 Medical Rec #:  [id number]        Height:       65.0 in Accession #:    [id number]       Weight:       222.0 lb Date of Birth:  07-May-1961         BSA:          2.07 m Patient Age:    56 years         BP:           86/62 mmHg Patient Gender: F                HR:           82 bpm. Exam Location:  [facility] Procedure: 2D Echo, Color Doppler and Cardiac Doppler Indications:     R06.89 Acute Respiratory Insufficiency  History:         Patient has no prior history of Echocardiogram examinations.  COPD; Risk Factors:Hypertension.  Sonographer:     Charmayne Sheer RDCS (AE) Referring Phys:  [id number] Awilda Bill Diagnosing Phys: Nelva Bush MD  Sonographer Comments: Echo performed with patient supine and on artificial respirator, no subcostal window and suboptimal parasternal window. Image acquisition challenging due to respiratory motion. IMPRESSIONS  1. Left ventricular ejection fraction, by visual estimation, is 60 to 65%. The left ventricle has normal function. There is mildly increased left ventricular hypertrophy.  2. Left ventricular diastolic parameters are consistent with Grade I diastolic dysfunction (impaired relaxation).  3. The left ventricle has no regional wall motion abnormalities.  4. Global right ventricle has normal systolic function.The right ventricular size is normal. Right vetricular wall thickness was not assessed.  5. Left atrial size was normal.  6. Right atrial size was normal.  7. Mild mitral annular calcification.  8. The mitral valve is degenerative. No evidence of mitral valve regurgitation. No evidence of mitral stenosis.  9. The tricuspid valve is not well visualized. 10. The tricuspid valve is not well visualized. Tricuspid valve regurgitation is not demonstrated. 11. The aortic valve was not well visualized. Aortic valve regurgitation is not visualized. No evidence of aortic valve  sclerosis or stenosis. 12. The pulmonic valve was not well visualized. Pulmonic valve regurgitation is not visualized. 13. TR signal is inadequate for assessing pulmonary artery systolic pressure. 14. The interatrial septum was not well visualized. FINDINGS  Left Ventricle: Left ventricular ejection fraction, by visual estimation, is 60 to 65%. The left ventricle has normal function. The left ventricle has no regional wall motion abnormalities. The left ventricular internal cavity size was the left ventricle is normal in size. There is mildly increased left ventricular hypertrophy. Left ventricular diastolic parameters are consistent with Grade I diastolic dysfunction (impaired relaxation). Right Ventricle: The right ventricular size is normal. Right vetricular wall thickness was not assessed. Global RV systolic function is has normal systolic function. Left Atrium: Left atrial size was normal in size. Right Atrium: Right atrial size was normal in size Pericardium: There is no evidence of pericardial effusion. Mitral Valve: The mitral valve is degenerative in appearance. There is mild thickening of the mitral valve leaflet(s). Mild mitral annular calcification. No evidence of mitral valve regurgitation. No evidence of mitral valve stenosis by observation. MV peak gradient, 7.2 mmHg. Tricuspid Valve: The tricuspid valve is not well visualized. Tricuspid valve regurgitation is not demonstrated. Aortic Valve: The aortic valve was not well visualized. Aortic valve regurgitation is not visualized. The aortic valve is structurally normal, with no evidence of sclerosis or stenosis. Aortic valve mean gradient measures 7.0 mmHg. Aortic valve peak gradient measures 11.0 mmHg. Aortic valve area, by VTI measures 1.96 cm. Pulmonic Valve: The pulmonic valve was not well visualized. Pulmonic valve regurgitation is not visualized. Pulmonic regurgitation is not visualized. No evidence of pulmonic stenosis. Aorta: The aortic root is  normal in size and structure. Pulmonary Artery: The pulmonary artery is not well seen. Venous: IVC assessment for right atrial pressure unable to be performed due to mechanical ventilation. IAS/Shunts: The interatrial septum was not well visualized.  LEFT VENTRICLE PLAX 2D LVIDd:         4.10 cm  Diastology LVIDs:         2.96 cm  LV e' lateral:   8.95 cm/s LV PW:         1.10 cm  LV E/e' lateral: 9.0 LV IVS:        1.16 cm  LV e' medial:  8.70 cm/s LVOT diam:     1.70 cm  LV E/e' medial:  9.3 LV SV:         40 ml LV SV Index:   18.39 LVOT Area:     2.27 cm  RIGHT VENTRICLE RV Basal diam:  3.48 cm LEFT ATRIUM             Index       RIGHT ATRIUM           Index LA diam:        3.50 cm 1.69 cm/m  RA Area:     13.00 cm LA Vol (A2C):   31.5 ml 15.23 ml/m RA Volume:   31.10 ml  15.04 ml/m LA Vol (A4C):   38.4 ml 18.57 ml/m LA Biplane Vol: 35.1 ml 16.97 ml/m  AORTIC VALVE AV Area (Vmax):    1.85 cm AV Area (Vmean):   1.59 cm AV Area (VTI):     1.96 cm AV Vmax:           166.00 cm/s AV Vmean:          124.000 cm/s AV VTI:            0.326 m AV Peak Grad:      11.0 mmHg AV Mean Grad:      7.0 mmHg LVOT Vmax:         135.00 cm/s LVOT Vmean:        86.700 cm/s LVOT VTI:          0.281 m LVOT/AV VTI ratio: 0.86  AORTA Ao Root diam: 3.20 cm MITRAL VALVE MV Area (PHT): 3.65 cm              SHUNTS MV Peak grad:  7.2 mmHg              Systemic VTI:  0.28 m MV Mean grad:  2.0 mmHg              Systemic Diam: 1.70 cm MV Vmax:       1.34 m/s MV Vmean:      73.9 cm/s MV VTI:        0.26 m MV PHT:        60.32 msec MV Decel Time: 208 msec MV E velocity: 80.50 cm/s  103 cm/s MV A velocity: 106.00 cm/s 70.3 cm/s MV E/A ratio:  0.76        1.5  Harrell Gave End MD Electronically signed by Nelva Bush MD Signature Date/Time: 10/07/2019/1:15:19 PM    Final    DG FL GUIDED LUMBAR PUNCTURE  Result Date: 10/07/2019 CLINICAL DATA:  Sepsis. EXAM: DIAGNOSTIC LUMBAR PUNCTURE UNDER FLUOROSCOPIC GUIDANCE FLUOROSCOPY TIME:   Fluoroscopy Time:  1 minutes 12 seconds Radiation Exposure Index (if provided by the fluoroscopic device): 64.3 mGy Number of Acquired Spot Images: 1 PROCEDURE: Informed consent was obtained from the patient prior to the procedure, including potential complications of headache, allergy, and pain. With the patient prone, the lower back was prepped with Betadine. 1% Lidocaine was used for local anesthesia. Lumbar puncture was performed at the L3-L4 level using a 22 gauge needle with return of opaque CSF. Eight ml of CSF were obtained for laboratory studies. The patient tolerated the procedure well and there were no apparent complications. IMPRESSION: Successful fluoroscopically directed lumbar puncture. 8 cc of opaque CSF obtained and sent to lab. Electronically Signed   By: Marcello Moores  Register   On: 10/07/2019 13:52  Indwelling Urinary Catheter continued, requirement due to            Ventilator continued, requirement due to severe respiratory failure   Ventilator Sedation RASS 0 to -2      ASSESSMENT AND PLAN SYNOPSIS Severe ACUTE Hypoxic and Hypercapnic Respiratory Failure from severe sepsis and severe meningitis with STREP PNEUMONIA LEADING to severe toxic metabolic encephalopathy  Severe ACUTE Hypoxic and Hypercapnic Respiratory Failure -continue Full MV support -continue Bronchodilator Therapy -Wean Fio2 and PEEP as tolerated -will perform SAT/SBT when respiratory parameters are met   ACUTE KIDNEY INJURY/Renal Failure -follow chem 7 -follow UO -continue Foley Catheter-assess need -Avoid nephrotoxic agents -Recheck creatinine    NEUROLOGY 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   Overall, patient is critically ill, prognosis is guarded.  Patient with Multiorgan failure and at high risk for cardiac arrest and death.    Corrin Parker, M.D.  [facility]  Medical Director [place] Director [facility] Cardio-Pulmonary Department

## 2019-10-09 ENCOUNTER — Inpatient Hospital Stay: Payer: Self-pay

## 2019-10-09 DIAGNOSIS — G001 Pneumococcal meningitis: Secondary | ICD-10-CM

## 2019-10-09 LAB — CBC
HCT: 33.7 % — ABNORMAL LOW (ref 36.0–46.0)
Hemoglobin: 10.5 g/dL — ABNORMAL LOW (ref 12.0–15.0)
MCH: 27.7 pg (ref 26.0–34.0)
MCHC: 31.2 g/dL (ref 30.0–36.0)
MCV: 88.9 fL (ref 80.0–100.0)
Platelets: 323 10*3/uL (ref 150–400)
RBC: 3.79 MIL/uL — ABNORMAL LOW (ref 3.87–5.11)
RDW: 16.8 % — ABNORMAL HIGH (ref 11.5–15.5)
WBC: 17.2 10*3/uL — ABNORMAL HIGH (ref 4.0–10.5)
nRBC: 0.3 % — ABNORMAL HIGH (ref 0.0–0.2)

## 2019-10-09 LAB — GLUCOSE, CAPILLARY
Glucose-Capillary: 135 mg/dL — ABNORMAL HIGH (ref 70–99)
Glucose-Capillary: 151 mg/dL — ABNORMAL HIGH (ref 70–99)
Glucose-Capillary: 181 mg/dL — ABNORMAL HIGH (ref 70–99)
Glucose-Capillary: 208 mg/dL — ABNORMAL HIGH (ref 70–99)
Glucose-Capillary: 194 mg/dL — ABNORMAL HIGH (ref 70–99)
Glucose-Capillary: 160 mg/dL — ABNORMAL HIGH (ref 70–99)
Glucose-Capillary: 147 mg/dL — ABNORMAL HIGH (ref 70–99)
Glucose-Capillary: 163 mg/dL — ABNORMAL HIGH (ref 70–99)
Glucose-Capillary: 177 mg/dL — ABNORMAL HIGH (ref 70–99)
Glucose-Capillary: 179 mg/dL — ABNORMAL HIGH (ref 70–99)
Glucose-Capillary: 191 mg/dL — ABNORMAL HIGH (ref 70–99)
Glucose-Capillary: 155 mg/dL — ABNORMAL HIGH (ref 70–99)
Glucose-Capillary: 164 mg/dL — ABNORMAL HIGH (ref 70–99)
Glucose-Capillary: 181 mg/dL — ABNORMAL HIGH (ref 70–99)
Glucose-Capillary: 169 mg/dL — ABNORMAL HIGH (ref 70–99)

## 2019-10-09 LAB — BASIC METABOLIC PANEL
Anion gap: 8 (ref 5–15)
BUN: 33 mg/dL — ABNORMAL HIGH (ref 6–20)
CO2: 29 mmol/L (ref 22–32)
Calcium: 9.5 mg/dL (ref 8.9–10.3)
Chloride: 118 mmol/L — ABNORMAL HIGH (ref 98–111)
Creatinine, Ser: 0.84 mg/dL (ref 0.44–1.00)
GFR calc Af Amer: >60 mL/min (ref >60)
GFR calc non Af Amer: >60 mL/min (ref >60)
Glucose, Bld: 168 mg/dL — ABNORMAL HIGH (ref 70–99)
Potassium: 4.5 mmol/L (ref 3.5–5.1)
Sodium: 155 mmol/L — ABNORMAL HIGH (ref 135–145)

## 2019-10-09 LAB — TRIGLYCERIDES: Triglycerides: 157 mg/dL — ABNORMAL HIGH (ref <150)

## 2019-10-09 IMAGING — DX DG CHEST 1V PORT
1 series · 1 of 1 positions shown · non-contrast
Comparison: [DATE]

CLINICAL DATA: Endotracheal tube placement

EXAM:
PORTABLE CHEST 1 VIEW

[chest ap]
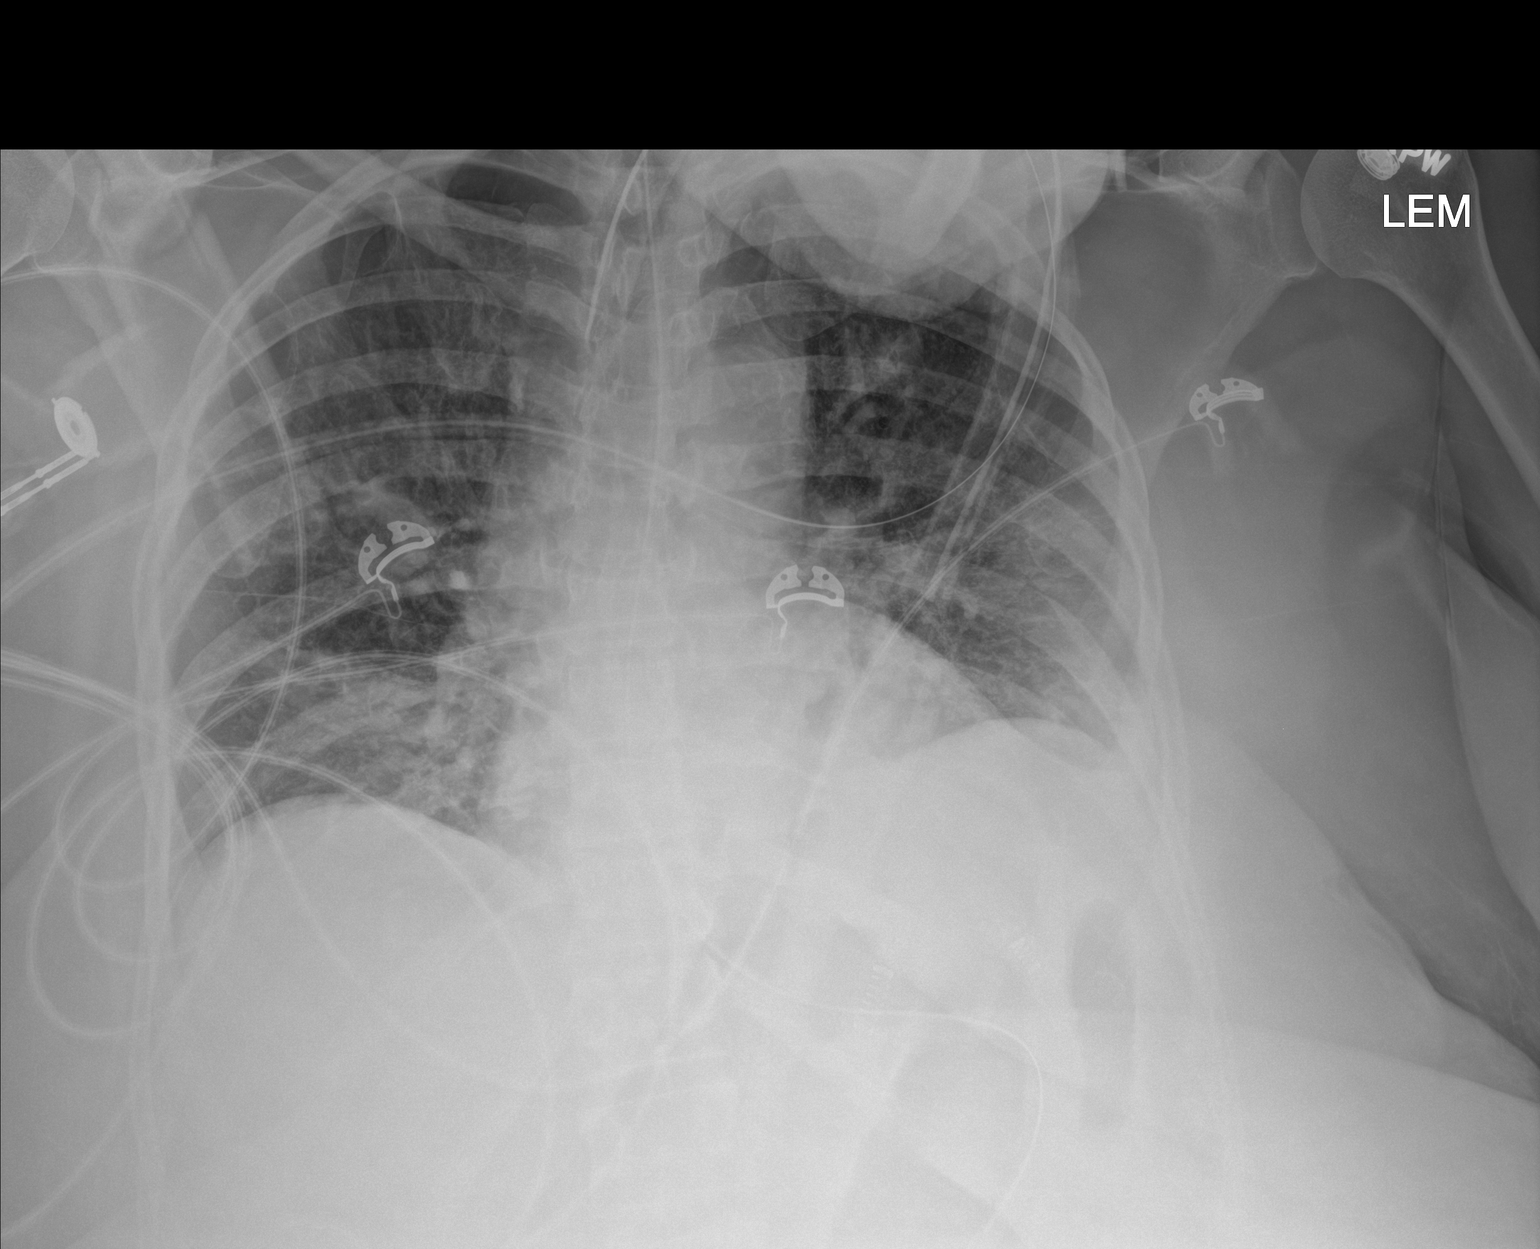

[1 of 1 positions shown; findings below may reference images not displayed]

FINDINGS: Endotracheal tube tip is 4 cm above the carina. Orogastric tube
enters the stomach. Bilateral patchy pulmonary infiltrates persist.
No worsening or new finding.
IMPRESSION: Tubes remain well positioned. Persistent patchy bilateral pulmonary
infiltrates.

## 2019-10-09 IMAGING — DX DG CHEST 1V PORT
1 series · 1 of 1 positions shown · non-contrast
Comparison: Two days ago

CLINICAL DATA: Acute respiratory failure

EXAM:
PORTABLE CHEST 1 VIEW

[chest ap]
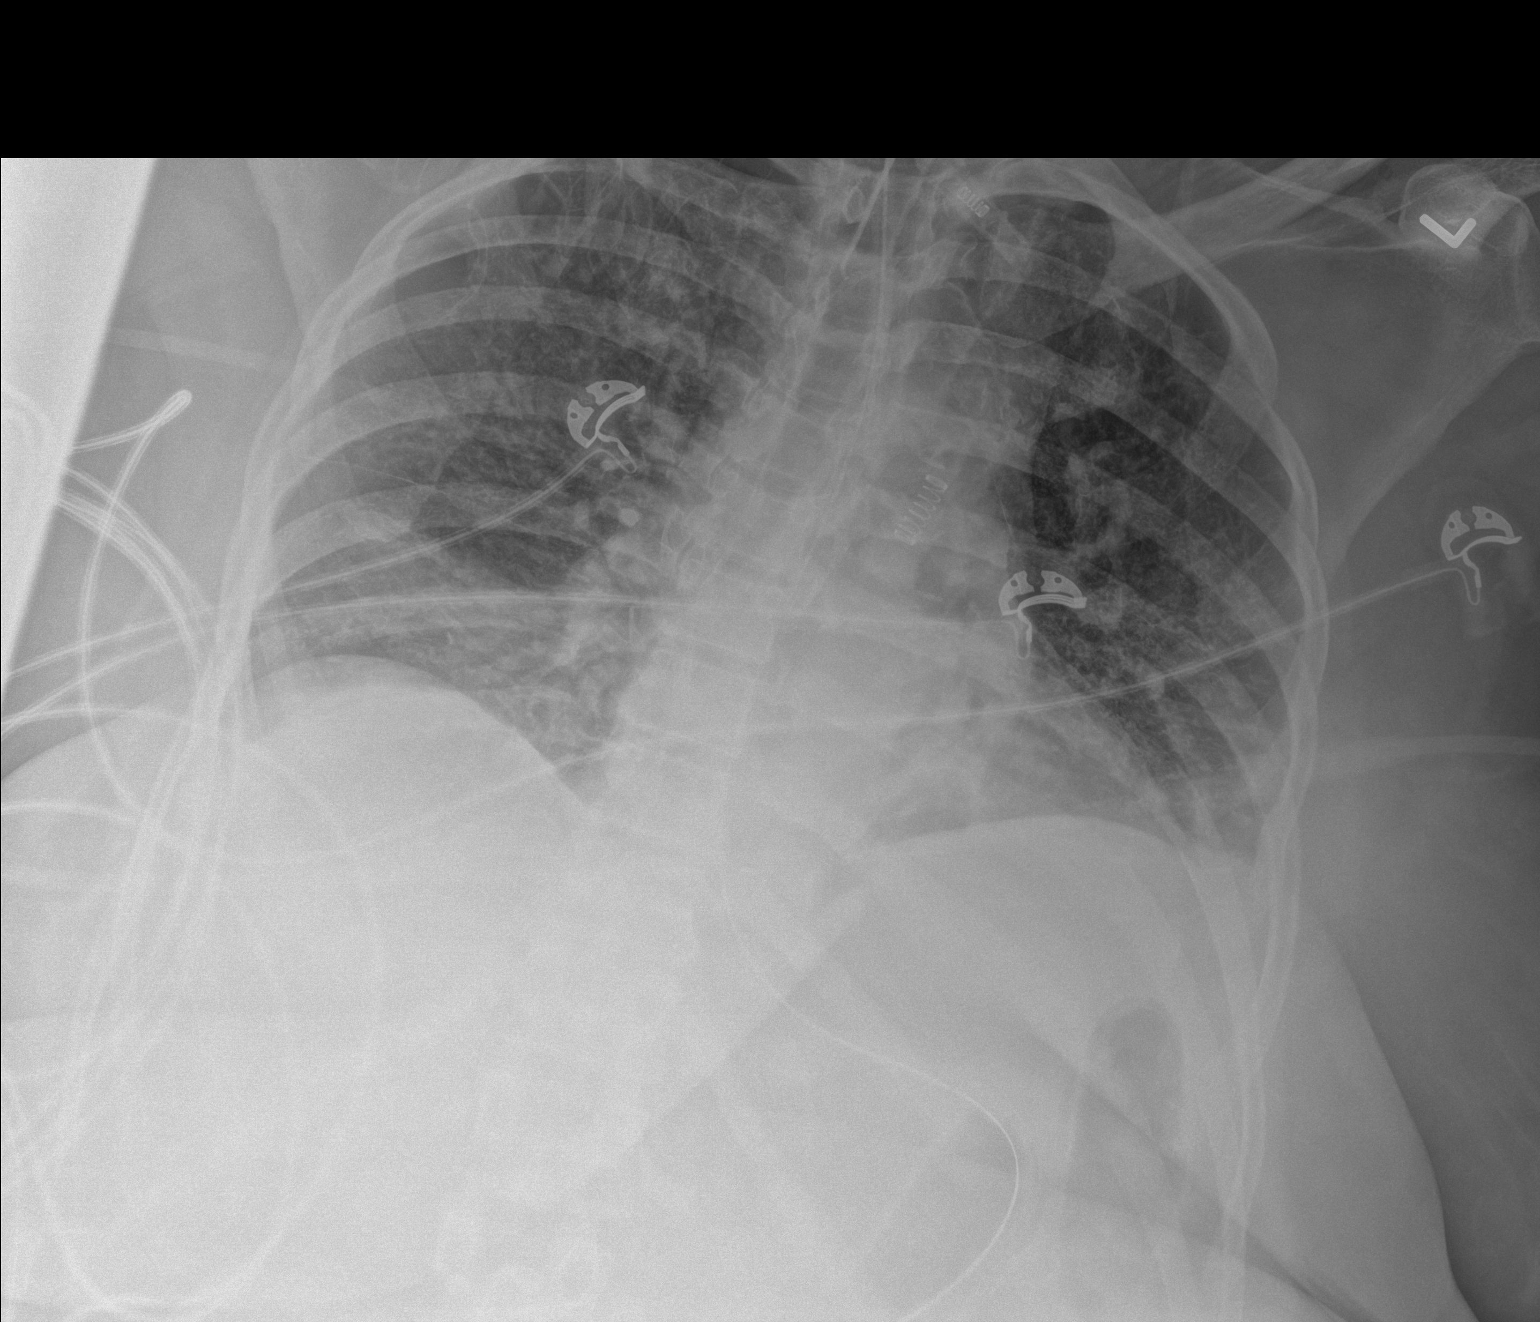

[1 of 1 positions shown; findings below may reference images not displayed]

FINDINGS: Endotracheal tube tip is at the clavicular heads. The enteric tube
tip at least reaches the stomach.

Hazy opacity at both lung bases. Bullous emphysematous change.
Accounting for emphysema lung volumes are low. Normal heart size.
IMPRESSION: Stable hardware positioning, low lung volumes, and infiltrates at
the lung bases.

## 2019-10-09 MED ORDER — DEXMEDETOMIDINE HCL IN NACL 400 MCG/100ML IV SOLN
0.4000 ug/kg/h | INTRAVENOUS | Status: DC
  Administered 2019-10-09: 1.2 ug/kg/h via INTRAVENOUS
  Administered 2019-10-10: 0.758 ug/kg/h via INTRAVENOUS
  Administered 2019-10-10 (×2): 1 ug/kg/h via INTRAVENOUS
  Filled 2019-10-09 (×3): qty 100

## 2019-10-09 MED ORDER — FREE WATER
200.0000 mL | Status: DC
  Administered 2019-10-09 – 2019-10-10 (×8): 200 mL

## 2019-10-09 MED ORDER — DEXAMETHASONE SODIUM PHOSPHATE 10 MG/ML IJ SOLN
10.0000 mg | Freq: Four times a day (QID) | INTRAMUSCULAR | Status: AC
  Administered 2019-10-09 (×4): 10 mg via INTRAVENOUS
  Filled 2019-10-09 (×4): qty 1

## 2019-10-09 NOTE — Progress Notes (Signed)
Assisted tele visit to patient with several family members this am.  Maryelizabeth Rowan, RN

## 2019-10-09 NOTE — Progress Notes (Signed)
CRITICAL CARE NOTE BRIEF PATIENT DESCRIPTION: 59 yo female admitted with acute respiratory failure secondary to metabolic acidosis, acute toxic metabolic encephalopathy, and sepsis etiology secondary to meningitis requiring mechanical intubation  SIGNIFICANT EVENTS/STUDIES: 01/24: Pt arrived to the ER with c/o headache after taking oxycodone requiring admission to the stepdown unit 01/24: CT Head revealed no acute intracranial findings 01/25: Pt became minimally responsive after receiving low dose morphine and ativan, therefore PCCM consulted to assist with treatment plan. Pt subsequently required mechanical intubation due to acute respiratory failure  1/25 3/4 blood cultures +STREP PNEUMONIA, husband updated 1/26 status post LP status post CT of the head status post MRI Remains on the vent, brain infection Daughter and husband updated 1/27 failed SAT/SBT  MICRO DATA: Rapid COVID-19 01/24>>negative  Blood x2 01/25>> Urine 01/25>> COVID-1901/24>>  ANTIMICROBIALS: Cefepime x1 dose 01/24 Metronidazole x1 dose 01/24 Ceftriaxone 01/25>> Ampicillin 01/25>>1/26 Vancomycin 01/25>>    CC  Follow up rep failure  SUBJECTIVE Remains critically ill Prognosis guarded    BP 108/61   Pulse 81   Temp 98 F (36.7 C) (Oral)   Resp 19   Ht '5\' 5"'$  (1.651 m)   Wt 105.6 kg   LMP 09/30/2014   SpO2 93%   BMI 38.74 kg/m    I/O last 3 completed shifts: In: 8088 [I.V.:4114.2; NG/GT:858.8; IV Piggyback:300.1] Out: 3600 [Urine:3600] No intake/output data recorded.  SpO2: 93 % O2 Flow Rate (L/min): 2 L/min FiO2 (%): 50 %    REVIEW OF SYSTEMS  PATIENT IS UNABLE TO PROVIDE COMPLETE REVIEW OF SYSTEM S DUE TO SEVERE CRITICAL ILLNESS AND ENCEPHALOPATHY   PHYSICAL EXAMINATION:  GENERAL:critically ill appearing, +resp distress HEAD: Normocephalic, atraumatic.  EYES: Pupils equal, round, reactive to light.  No scleral icterus.  MOUTH: Moist mucosal membrane. NECK: Supple. No  thyromegaly. No nodules. No JVD.  PULMONARY: +rhonchi, +wheezing CARDIOVASCULAR: S1 and S2. Regular rate and rhythm. No murmurs, rubs, or gallops.  GASTROINTESTINAL: Soft, nontender, -distended. Positive bowel sounds.  MUSCULOSKELETAL: No swelling, clubbing, or edema.  NEUROLOGIC: obtunded SKIN:intact,warm,dry    MEDICATIONS: I have reviewed all medications and confirmed regimen as documented   CULTURE RESULTS   Recent Results (from the past 240 hour(s))  Culture, blood (routine x 2)     Status: Abnormal   Collection Time: 10/06/19 12:13 AM   Specimen: BLOOD  Result Value Ref Range Status   Specimen Description   Final    BLOOD LEFT HAND Performed at [facility], [address] [place], [place] [address]    Special Requests   Final    BOTTLES DRAWN AEROBIC AND ANAEROBIC Blood Culture results may not be optimal due to an inadequate volume of blood received in culture bottles Performed at [facility], [address] [place], [place] [address]    Culture  Setup Time   Final    GRAM POSITIVE COCCI IN BOTH AEROBIC AND ANAEROBIC BOTTLES CRITICAL RESULT CALLED TO, READ BACK BY AND VERIFIED WITH: Rito Ehrlich ON 10/06/19 AT 24 QSD Performed at [facility], [place] [address], [place], [place] [address]    Culture STREPTOCOCCUS PNEUMONIAE (A)  Final   Report Status 10/08/2019 FINAL  Final   Organism ID, Bacteria STREPTOCOCCUS PNEUMONIAE  Final      Susceptibility   Streptococcus pneumoniae - MIC*    ERYTHROMYCIN >=8 RESISTANT Resistant     LEVOFLOXACIN 0.5 SENSITIVE Sensitive     VANCOMYCIN 0.25 SENSITIVE Sensitive     PENICILLIN (non-meningitis) 0.25 SENSITIVE Sensitive  PENICILLIN (oral) 0.25 INTERMEDIATE Intermediate     CEFTRIAXONE (non-meningitis) 0.25 SENSITIVE Sensitive     * STREPTOCOCCUS PNEUMONIAE  Culture, blood (routine x 2)     Status: Abnormal   Collection Time: 10/06/19 12:13 AM   Specimen: BLOOD  Result Value Ref Range Status    Specimen Description   Final    BLOOD RIGHT HAND Performed at [facility], [address] [place], [place] [address]    Special Requests   Final    BOTTLES DRAWN AEROBIC AND ANAEROBIC Blood Culture results may not be optimal due to an inadequate volume of blood received in culture bottles Performed at [facility], [address], [place], [place] [address]    Culture  Setup Time   Final    GRAM POSITIVE COCCI IN BOTH AEROBIC AND ANAEROBIC BOTTLES CRITICAL RESULT CALLED TO, READ BACK BY AND VERIFIED WITH: LISA KLUTTZ ON 10/06/19 AT 1032 QSD Performed at [facility], [address] [place], [place] [address]    Culture (A)  Final    STREPTOCOCCUS PNEUMONIAE SUSCEPTIBILITIES PERFORMED ON PREVIOUS CULTURE WITHIN THE LAST 5 DAYS. Performed at [facility], [place] [address], [place], [place] [address]    Report Status 10/08/2019 FINAL  Final  Blood Culture ID Panel (Reflexed)     Status: Abnormal   Collection Time: 10/06/19 12:13 AM  Result Value Ref Range Status   Enterococcus species NOT DETECTED NOT DETECTED Final   Listeria monocytogenes NOT DETECTED NOT DETECTED Final   Staphylococcus species NOT DETECTED NOT DETECTED Final   Staphylococcus aureus (BCID) NOT DETECTED NOT DETECTED Final   Streptococcus species DETECTED (A) NOT DETECTED Final    Comment: CRITICAL RESULT CALLED TO, READ BACK BY AND VERIFIED WITH: WALID NAZARI ON 10/06/19 AT 1225 QSD    Streptococcus agalactiae NOT DETECTED NOT DETECTED Final   Streptococcus pneumoniae DETECTED (A) NOT DETECTED Final    Comment: CRITICAL RESULT CALLED TO, READ BACK BY AND VERIFIED WITH: WALID NAZARI ON 10/06/19 AT 1225 QSD    Streptococcus pyogenes NOT DETECTED NOT DETECTED Final   Acinetobacter baumannii NOT DETECTED NOT DETECTED Final   Enterobacteriaceae species NOT DETECTED NOT DETECTED Final   Enterobacter cloacae complex NOT DETECTED NOT DETECTED Final   Escherichia coli NOT DETECTED NOT  DETECTED Final   Klebsiella oxytoca NOT DETECTED NOT DETECTED Final   Klebsiella pneumoniae NOT DETECTED NOT DETECTED Final   Proteus species NOT DETECTED NOT DETECTED Final   Serratia marcescens NOT DETECTED NOT DETECTED Final   Haemophilus influenzae NOT DETECTED NOT DETECTED Final   Neisseria meningitidis NOT DETECTED NOT DETECTED Final   Pseudomonas aeruginosa NOT DETECTED NOT DETECTED Final   Candida albicans NOT DETECTED NOT DETECTED Final   Candida glabrata NOT DETECTED NOT DETECTED Final   Candida krusei NOT DETECTED NOT DETECTED Final   Candida parapsilosis NOT DETECTED NOT DETECTED Final   Candida tropicalis NOT DETECTED NOT DETECTED Final    Comment: Performed at [facility], [address] [place], [place] [address]  Urine Culture     Status: None   Collection Time: 10/06/19 12:51 AM   Specimen: Urine, Random  Result Value Ref Range Status   Specimen Description   Final    URINE, RANDOM Performed at [facility], [address] [place], [place] [address]    Special Requests   Final    NONE Performed at [facility], [address] [place], [place] [address]    Culture  Final    NO GROWTH Performed at [facility], [place] [address] [place], [place] [address]    Report Status 10/07/2019 FINAL  Final  SARS CORONAVIRUS 2 (TAT 6-24 HRS) Nasopharyngeal Nasopharyngeal Swab     Status: None   Collection Time: 10/06/19  2:30 AM   Specimen: Nasopharyngeal Swab  Result Value Ref Range Status   SARS Coronavirus 2 NEGATIVE NEGATIVE Final    Comment: (NOTE) SARS-CoV-2 target nucleic acids are NOT DETECTED. The SARS-CoV-2 RNA is generally detectable in upper and lower respiratory specimens during the acute phase of infection. Negative results do not preclude SARS-CoV-2 infection, do not rule out co-infections with other pathogens, and should not be used as the sole basis for treatment or other patient management decisions. Negative  results must be combined with clinical observations, patient history, and epidemiological information. The expected result is Negative. Fact Sheet for Patients: SugarRoll.be Fact Sheet for Healthcare Providers: https://www.woods-mathews.com/ This test is not yet approved or cleared by the [place] FDA and  has been authorized for detection and/or diagnosis of SARS-CoV-2 by FDA under an Emergency Use Authorization (EUA). This EUA will remain  in effect (meaning this test can be used) for the duration of the COVID-19 declaration under Section 56 4(b)(1) of the Act, 21 U.S.C. section 360bbb-3(b)(1), unless the authorization is terminated or revoked sooner. Performed at [facility], [place] [address] [place], [place] [address]   Respiratory Panel by PCR     Status: None   Collection Time: 10/06/19  3:40 AM   Specimen: Nasopharyngeal Swab; Respiratory  Result Value Ref Range Status   Adenovirus NOT DETECTED NOT DETECTED Final   Coronavirus 229E NOT DETECTED NOT DETECTED Final    Comment: (NOTE) The Coronavirus on the Respiratory Panel, DOES NOT test for the novel  Coronavirus (2019 nCoV)    Coronavirus HKU1 NOT DETECTED NOT DETECTED Final   Coronavirus NL63 NOT DETECTED NOT DETECTED Final   Coronavirus OC43 NOT DETECTED NOT DETECTED Final   Metapneumovirus NOT DETECTED NOT DETECTED Final   Rhinovirus / Enterovirus NOT DETECTED NOT DETECTED Final   Influenza A NOT DETECTED NOT DETECTED Final   Influenza B NOT DETECTED NOT DETECTED Final   Parainfluenza Virus 1 NOT DETECTED NOT DETECTED Final   Parainfluenza Virus 2 NOT DETECTED NOT DETECTED Final   Parainfluenza Virus 3 NOT DETECTED NOT DETECTED Final   Parainfluenza Virus 4 NOT DETECTED NOT DETECTED Final   Respiratory Syncytial Virus NOT DETECTED NOT DETECTED Final   Bordetella pertussis NOT DETECTED NOT DETECTED Final   Chlamydophila pneumoniae NOT DETECTED NOT DETECTED Final    Mycoplasma pneumoniae NOT DETECTED NOT DETECTED Final    Comment: Performed at [facility], [place]. [address] [place], [place] [address]  MRSA PCR Screening     Status: None   Collection Time: 10/06/19  3:41 AM   Specimen: Nasopharyngeal  Result Value Ref Range Status   MRSA by PCR NEGATIVE NEGATIVE Final    Comment:        The GeneXpert MRSA Assay (FDA approved for NASAL specimens only), is one component of a comprehensive MRSA colonization surveillance program. It is not intended to diagnose MRSA infection nor to guide or monitor treatment for MRSA infections. Performed at [facility], [address] [place], [place] [address]   CULTURE, BLOOD (ROUTINE X 2) w Reflex to ID Panel     Status: None (Preliminary result)   Collection Time: 10/07/19  1:17 AM  Specimen: BLOOD  Result Value Ref Range Status   Specimen Description BLOOD LEFT HAND  Final   Special Requests   Final    BOTTLES DRAWN AEROBIC AND ANAEROBIC Blood Culture adequate volume   Culture   Final    NO GROWTH 2 DAYS Performed at [facility], [address], [place], [place] [address]    Report Status PENDING  Incomplete  CULTURE, BLOOD (ROUTINE X 2) w Reflex to ID Panel     Status: None (Preliminary result)   Collection Time: 10/07/19  1:17 AM   Specimen: BLOOD  Result Value Ref Range Status   Specimen Description BLOOD LEFT WRIST  Final   Special Requests   Final    BOTTLES DRAWN AEROBIC AND ANAEROBIC Blood Culture adequate volume   Culture   Final    NO GROWTH 2 DAYS Performed at [facility], [address] [place], [place] [address]    Report Status PENDING  Incomplete  CSF culture     Status: None (Preliminary result)   Collection Time: 10/07/19  1:22 PM   Specimen: PATH Cytology CSF; Cerebrospinal Fluid  Result Value Ref Range Status   Specimen Description   Final    CSF Performed at [facility], [address] [place], [place] [address]    Special  Requests   Final    NONE Performed at [facility], [address] [place], [place] [address]    Gram Stain   Final    NO ORGANISMS SEEN WBC SEEN RED BLOOD CELLS RESULT CALLED TO, READ BACK BY AND VERIFIED WITH: MEGAN SCHEFFIELD 10/07/19 @ 1526  [place] Performed at [facility], [address] [place], [place] [address]    Culture   Final    NO GROWTH < 24 HOURS Performed at [facility], [place] [address] [place], [place] [address]    Report Status PENDING  Incomplete          IMAGING    DG Chest Port 1 View  Result Date: 10/09/2019 CLINICAL DATA:  Acute respiratory failure EXAM: PORTABLE CHEST 1 VIEW COMPARISON:  Two days ago FINDINGS: Endotracheal tube tip is at the clavicular heads. The enteric tube tip at least reaches the stomach. Hazy opacity at both lung bases. Bullous emphysematous change. Accounting for emphysema lung volumes are low. Normal heart size. IMPRESSION: Stable hardware positioning, low lung volumes, and infiltrates at the lung bases. Electronically Signed   By: Monte Fantasia M.D.   On: 10/09/2019 04:34    CBC    Component Value Date/Time   WBC 17.2 (H) 10/09/2019 0509   RBC 3.79 (L) 10/09/2019 0509   HGB 10.5 (L) 10/09/2019 0509   HCT 33.7 (L) 10/09/2019 0509   PLT 323 10/09/2019 0509   MCV 88.9 10/09/2019 0509   MCH 27.7 10/09/2019 0509   MCHC 31.2 10/09/2019 0509   RDW 16.8 (H) 10/09/2019 0509   LYMPHSABS 1.1 10/08/2019 0553   MONOABS 0.6 10/08/2019 0553   EOSABS 0.0 10/08/2019 0553   BASOSABS 0.0 10/08/2019 0553   BMP Latest Ref Rng & Units 10/09/2019 10/08/2019 10/07/2019  Glucose 70 - 99 mg/dL 168(H) 222(H) 280(H)  BUN 6 - 20 mg/dL 33(H) 25(H) 19  Creatinine 0.44 - 1.00 mg/dL 0.84 0.90 1.12(H)  Sodium 135 - 145 mmol/L 155(H) 148(H) 140  Potassium 3.5 - 5.1 mmol/L 4.5 3.9 4.2  Chloride 98 - 111 mmol/L 118(H) 114(H) 106  CO2 22 - 32 mmol/L 29 27 26  Calcium 8.9 - 10.3 mg/dL 9.5 9.1 8.4(L)      Indwelling Urinary  Catheter continued, requirement due to            Ventilator continued, requirement due to severe respiratory failure   Ventilator Sedation RASS 0 to -2      ASSESSMENT AND PLAN SYNOPSIS   Severe ACUTE Hypoxic and Hypercapnic Respiratory Failure from severe sepsis and severe meningitis with STREP PNEUMONIA LEADING to severe toxic metabolic encephalopathy  Severe ACUTE Hypoxic and Hypercapnic Respiratory Failure -continue Mechanical Ventilator support -continue Bronchodilator Therapy -Wean Fio2 and PEEP as tolerated -VAP/VENT bundle implementation -will perform SAT/SBT when respiratory parameters are met    NEUROLOGY - intubated and sedated - minimal sedation to achieve a RASS goal: -1 Severe brain infection from strep pneumonia meningitis SAT when husband arrives    CARDIAC ICU monitoring  INFECTIOUS DISEASE -continue antibiotics as prescribed -follow up cultures -follow up ID consultation    GI GI PROPHYLAXIS as indicated  NUTRITIONAL STATUS DIET-->TF's as tolerated Constipation protocol as indicated  ENDO - ICU hypoglycemic\Hyperglycemia protocol -check FSBS per protocol  ELECTROLYTES -follow labs as needed -replace as needed -pharmacy consultation and following    DVT/GI PRX ordered TRANSFUSIONS AS NEEDED MONITOR FSBS ASSESS the need for LABS as needed    Critical Care Time devoted to patient care services described in this note is 32 minutes.   Overall, patient is critically ill, prognosis is guarded.  Patient with Multiorgan failure and at high risk for cardiac arrest and death.    Corrin Parker, M.D.  [facility]  Medical Director [place] Director [facility] Cardio-Pulmonary Department

## 2019-10-09 NOTE — Progress Notes (Signed)
Biting ETT and stacking breaths.Per Dr. Mortimer Fries do not perform WUA until husband at bedside.

## 2019-10-09 NOTE — Progress Notes (Signed)
ID Remains intubated When they tried to wean her off sedation she became agitated and bucking on the tube Had to get Vecuronium  O/E Intubated and sedated BP 116/66   Pulse 94   Temp 97.7 F (36.5 C)   Resp 18   Ht 5\' 5"  (1.651 m)   Wt 105.6 kg   LMP 09/30/2014   SpO2 94%   BMI 38.74 kg/m    Chest b/l air entry HS s1s2 Foley catheter CNS - cannot be assessed   CBC Latest Ref Rng & Units 10/09/2019 10/08/2019 10/07/2019  WBC 4.0 - 10.5 K/uL 17.2(H) 19.6(H) 25.1(H)  Hemoglobin 12.0 - 15.0 g/dL 10.5(L) 10.9(L) 11.9(L)  Hematocrit 36.0 - 46.0 % 33.7(L) 34.3(L) 37.1  Platelets 150 - 400 K/uL 323 343 356    CMP Latest Ref Rng & Units 10/09/2019 10/08/2019 10/07/2019  Glucose 70 - 99 mg/dL 168(H) 222(H) 280(H)  BUN 6 - 20 mg/dL 33(H) 25(H) 19  Creatinine 0.44 - 1.00 mg/dL 0.84 0.90 1.12(H)  Sodium 135 - 145 mmol/L 155(H) 148(H) 140  Potassium 3.5 - 5.1 mmol/L 4.5 3.9 4.2  Chloride 98 - 111 mmol/L 118(H) 114(H) 106  CO2 22 - 32 mmol/L 29 27 26   Calcium 8.9 - 10.3 mg/dL 9.5 9.1 8.4(L)  Total Protein 6.5 - 8.1 g/dL - 7.0 -  Total Bilirubin 0.3 - 1.2 mg/dL - 0.5 -  Alkaline Phos 38 - 126 U/L - 55 -  AST 15 - 41 U/L - 21 -  ALT 0 - 44 U/L - 23 -   Micro 10/06/19- BC- Strep pneumo 4/4 1/26 csf culture NG Impression/recommendation  Purulent meningitis: Strep pneumo in blood culture So this is causing meningitis as well Had ear pain for 3 weeks before this. CT brain/sinus normal May have to repeat to look for cerebral abscess/mastoiditis/middle ear/temproal lobe involvement Continue ceftriaxone 2 grams IV q 12  Also TEE is needed- will discuss with Cardiologist  Acute hypoxic resp failure- intubated Critically ill - guarded prognosis  Discussed the management with her nurse

## 2019-10-09 NOTE — Consult Note (Signed)
PHARMACY CONSULT NOTE - FOLLOW UP  Pharmacy Consult for Electrolyte Monitoring and Replacement   Recent Labs: Potassium (mmol/L)  Date Value  10/09/2019 4.5   Magnesium (mg/dL)  Date Value  10/08/2019 3.0 (H)   Calcium (mg/dL)  Date Value  10/09/2019 9.5   Albumin (g/dL)  Date Value  10/08/2019 2.7 (L)   Phosphorus (mg/dL)  Date Value  10/08/2019 4.0   Sodium (mmol/L)  Date Value  10/09/2019 155 (H)     Assessment: Pharmacy has been consulted to manage electrolytes for this 59 year-old female admitted and intubated in the ICU with pneumococcal bacteremia and purulent meningitis.  Patient has a PMH significant for prediabetes, hypertension, and COPD.  Renal function is improving and approaching baseline (Baseline Scr appears to be 0.77 to 0.91).  Patient is becoming hypernatremic, and currently managed with free water q4 hours.  Electrolytes Goal:  All electrolytes WNL Potassium seems stable.  No supplementation needed at this time.  Will continue to monitor potassium while patient is on insulin drip.   Electrolytes with AM labs.  Glucose:  A1c 8.2.  24-hour SBG ranging form 147 to 208.  SBG slowly dropping on insulin drip.  She is receiving Pro-Stat 60 mL BID per tube and VITAL High Protein at 25 mL/hour per tube.  IV dexamethasone will stop tomorrow, and anticipate this will lead to improvement in SBG.  Plan to keep insulin drip until stopping dexamethasone.  Will continue to follow glucose control.       Bowel Regimen LBM 1/28.  Currently on scheduled senna-docusate and miralax while sedated with fentanyl and propofol infusions.  Will continue to monitor daily.  Gerald Dexter ,PharmD 10/09/2019 2:40 PM

## 2019-10-10 DIAGNOSIS — J9690 Respiratory failure, unspecified, unspecified whether with hypoxia or hypercapnia: Secondary | ICD-10-CM

## 2019-10-10 DIAGNOSIS — G009 Bacterial meningitis, unspecified: Secondary | ICD-10-CM

## 2019-10-10 LAB — GLUCOSE, CAPILLARY
Glucose-Capillary: 103 mg/dL — ABNORMAL HIGH (ref 70–99)
Glucose-Capillary: 125 mg/dL — ABNORMAL HIGH (ref 70–99)
Glucose-Capillary: 133 mg/dL — ABNORMAL HIGH (ref 70–99)
Glucose-Capillary: 135 mg/dL — ABNORMAL HIGH (ref 70–99)
Glucose-Capillary: 143 mg/dL — ABNORMAL HIGH (ref 70–99)
Glucose-Capillary: 145 mg/dL — ABNORMAL HIGH (ref 70–99)
Glucose-Capillary: 147 mg/dL — ABNORMAL HIGH (ref 70–99)
Glucose-Capillary: 154 mg/dL — ABNORMAL HIGH (ref 70–99)
Glucose-Capillary: 160 mg/dL — ABNORMAL HIGH (ref 70–99)
Glucose-Capillary: 167 mg/dL — ABNORMAL HIGH (ref 70–99)
Glucose-Capillary: 170 mg/dL — ABNORMAL HIGH (ref 70–99)
Glucose-Capillary: 191 mg/dL — ABNORMAL HIGH (ref 70–99)
Glucose-Capillary: 202 mg/dL — ABNORMAL HIGH (ref 70–99)

## 2019-10-10 LAB — BLOOD GAS, ARTERIAL
Acid-Base Excess: 8.6 mmol/L — ABNORMAL HIGH (ref 0.0–2.0)
Bicarbonate: 34.1 mmol/L — ABNORMAL HIGH (ref 20.0–28.0)
FIO2: 0.3
O2 Saturation: 93.9 %
PEEP: 5 cmH2O
Patient temperature: 37
Pressure support: 5 cmH2O
pCO2 arterial: 49 mmHg — ABNORMAL HIGH (ref 32.0–48.0)
pH, Arterial: 7.45 (ref 7.350–7.450)
pO2, Arterial: 67 mmHg — ABNORMAL LOW (ref 83.0–108.0)

## 2019-10-10 LAB — BASIC METABOLIC PANEL
Anion gap: 8 (ref 5–15)
BUN: 39 mg/dL — ABNORMAL HIGH (ref 6–20)
CO2: 30 mmol/L (ref 22–32)
Calcium: 9.3 mg/dL (ref 8.9–10.3)
Chloride: 114 mmol/L — ABNORMAL HIGH (ref 98–111)
Creatinine, Ser: 0.75 mg/dL (ref 0.44–1.00)
GFR calc Af Amer: >60 mL/min (ref >60)
GFR calc non Af Amer: >60 mL/min (ref >60)
Glucose, Bld: 198 mg/dL — ABNORMAL HIGH (ref 70–99)
Potassium: 4.2 mmol/L (ref 3.5–5.1)
Sodium: 152 mmol/L — ABNORMAL HIGH (ref 135–145)

## 2019-10-10 LAB — IGG, IGA, IGM
IgA: 439 mg/dL — ABNORMAL HIGH (ref 87–352)
IgG (Immunoglobin G), Serum: 747 mg/dL (ref 586–1602)
IgM (Immunoglobulin M), Srm: 62 mg/dL (ref 26–217)

## 2019-10-10 LAB — MAGNESIUM: Magnesium: 3 mg/dL — ABNORMAL HIGH (ref 1.7–2.4)

## 2019-10-10 LAB — PHOSPHORUS: Phosphorus: 4.4 mg/dL (ref 2.5–4.6)

## 2019-10-10 MED ORDER — INSULIN DETEMIR 100 UNIT/ML ~~LOC~~ SOLN
8.0000 [IU] | Freq: Two times a day (BID) | SUBCUTANEOUS | Status: DC
  Filled 2019-10-10 (×2): qty 0.08

## 2019-10-10 MED ORDER — INSULIN ASPART 100 UNIT/ML ~~LOC~~ SOLN: 2.0000 [IU] | SUBCUTANEOUS | Status: DC

## 2019-10-10 MED ORDER — DEXTROSE 5 % IV SOLN: INTRAVENOUS | Status: DC

## 2019-10-10 MED ORDER — INSULIN DETEMIR 100 UNIT/ML ~~LOC~~ SOLN
8.0000 [IU] | Freq: Two times a day (BID) | SUBCUTANEOUS | Status: DC
  Administered 2019-10-10 – 2019-10-15 (×10): 8 [IU] via SUBCUTANEOUS
  Filled 2019-10-10 (×11): qty 0.08

## 2019-10-10 MED ORDER — FAMOTIDINE IN NACL 20-0.9 MG/50ML-% IV SOLN
20.0000 mg | Freq: Two times a day (BID) | INTRAVENOUS | Status: DC
  Administered 2019-10-10 – 2019-10-15 (×10): 20 mg via INTRAVENOUS
  Filled 2019-10-10 (×11): qty 50

## 2019-10-10 MED ORDER — HYDRALAZINE HCL 20 MG/ML IJ SOLN
10.0000 mg | INTRAMUSCULAR | Status: DC | PRN
  Administered 2019-10-10 – 2019-10-13 (×2): 20 mg via INTRAVENOUS
  Filled 2019-10-10 (×3): qty 1

## 2019-10-10 MED ORDER — INSULIN ASPART 100 UNIT/ML ~~LOC~~ SOLN
0.0000 [IU] | SUBCUTANEOUS | Status: DC
  Administered 2019-10-10: 3 [IU] via SUBCUTANEOUS
  Administered 2019-10-11: 2 [IU] via SUBCUTANEOUS
  Administered 2019-10-11: 3 [IU] via SUBCUTANEOUS
  Administered 2019-10-11 – 2019-10-13 (×4): 2 [IU] via SUBCUTANEOUS
  Filled 2019-10-10 (×6): qty 1

## 2019-10-10 NOTE — Progress Notes (Signed)
CRITICAL CARE NOTE BRIEF PATIENT DESCRIPTION: 59 yo female admitted with acute respiratory failure secondary to metabolic acidosis, acute toxic metabolic encephalopathy, and sepsis etiology secondary to meningitis requiring mechanical intubation  SIGNIFICANT EVENTS/STUDIES: 01/24: Pt arrived to the ER with c/o headache after taking oxycodone requiring admission to the stepdown unit 01/24: CT Head revealed no acute intracranial findings 01/25: Pt became minimally responsive after receiving low dose morphine and ativan, therefore PCCM consulted to assist with treatment plan. Pt subsequently required mechanical intubation due to acute respiratory failure 1/25 3/4 blood cultures +STREP PNEUMONIA, husband updated 1/26 status post LP status post CT of the head status post MRI Remains on the vent, brain infection Daughter and husband updated 1/27 failed SAT/SBT 1/28 failed SAT/SBT due to encephalopathy and severe resp failure   MICRO DATA: Rapid COVID-19 01/24>>negative  Blood x2 01/25>> Urine 01/25>> COVID-1901/24>>  ANTIMICROBIALS: Cefepime x1 dose 01/24 Metronidazole x1 dose 01/24 Ceftriaxone 01/25>> Ampicillin 01/25>>1/26 Vancomycin 01/25>>1/27  CC  follow up respiratory failure  SUBJECTIVE Patient remains critically ill Prognosis is guarded Severe brain infection Severe resp failure   BP (!) 144/81   Pulse 73   Temp 99 F (37.2 C) (Axillary)   Resp 18   Ht '5\' 5"'$  (1.651 m)   Wt 106 kg   LMP 09/30/2014   SpO2 96%   BMI 38.89 kg/m    I/O last 3 completed shifts: In: 3134.4 [I.V.:2350.5; NG/GT:483.8; IV Piggyback:300.1] Out: 4150 [Urine:4150] Total I/O In: 100 [IV Piggyback:100] Out: -   SpO2: 96 % O2 Flow Rate (L/min): 2 L/min FiO2 (%): (S) 40 %     REVIEW OF SYSTEMS  PATIENT IS UNABLE TO PROVIDE COMPLETE REVIEW OF SYSTEMS DUE TO SEVERE CRITICAL ILLNESS   PHYSICAL EXAMINATION:  GENERAL:critically ill appearing, +resp distress HEAD:  Normocephalic, atraumatic.  EYES: Pupils equal, round, reactive to light.  No scleral icterus.  MOUTH: Moist mucosal membrane. NECK: Supple.  PULMONARY: +rhonchi, +wheezing CARDIOVASCULAR: S1 and S2. Regular rate and rhythm. No murmurs, rubs, or gallops.  GASTROINTESTINAL: Soft, nontender, -distended.  Positive bowel sounds.   MUSCULOSKELETAL: No swelling, clubbing, or edema.  NEUROLOGIC: obtunded, GCS<8 SKIN:intact,warm,dry  MEDICATIONS: I have reviewed all medications and confirmed regimen as documented   CULTURE RESULTS   Recent Results (from the past 240 hour(s))  Culture, blood (routine x 2)     Status: Abnormal   Collection Time: 10/06/19 12:13 AM   Specimen: BLOOD  Result Value Ref Range Status   Specimen Description   Final    BLOOD LEFT HAND Performed at [facility], [address] [place], [place] [address]    Special Requests   Final    BOTTLES DRAWN AEROBIC AND ANAEROBIC Blood Culture results may not be optimal due to an inadequate volume of blood received in culture bottles Performed at [facility], [address], [place], [place] [address]    Culture  Setup Time   Final    GRAM POSITIVE COCCI IN BOTH AEROBIC AND ANAEROBIC BOTTLES CRITICAL RESULT CALLED TO, READ BACK BY AND VERIFIED WITH: Rito Ehrlich ON 10/06/19 AT 79 QSD Performed at [facility], [place] [address] [place], [place] [address]    Culture STREPTOCOCCUS PNEUMONIAE (A)  Final   Report Status 10/08/2019 FINAL  Final   Organism ID, Bacteria STREPTOCOCCUS PNEUMONIAE  Final      Susceptibility   Streptococcus pneumoniae - MIC*    ERYTHROMYCIN >=8 RESISTANT Resistant     LEVOFLOXACIN 0.5 SENSITIVE Sensitive  VANCOMYCIN 0.25 SENSITIVE Sensitive     PENICILLIN (non-meningitis) 0.25 SENSITIVE Sensitive     PENICILLIN (oral) 0.25 INTERMEDIATE Intermediate     CEFTRIAXONE (non-meningitis) 0.25 SENSITIVE Sensitive     * STREPTOCOCCUS PNEUMONIAE  Culture, blood (routine x  2)     Status: Abnormal   Collection Time: 10/06/19 12:13 AM   Specimen: BLOOD  Result Value Ref Range Status   Specimen Description   Final    BLOOD RIGHT HAND Performed at [facility], [address], [place], [place] [address]    Special Requests   Final    BOTTLES DRAWN AEROBIC AND ANAEROBIC Blood Culture results may not be optimal due to an inadequate volume of blood received in culture bottles Performed at [facility], [address] [place], [place] [address]    Culture  Setup Time   Final    GRAM POSITIVE COCCI IN BOTH AEROBIC AND ANAEROBIC BOTTLES CRITICAL RESULT CALLED TO, READ BACK BY AND VERIFIED WITH: LISA KLUTTZ ON 10/06/19 AT 1032 QSD Performed at [facility], [address] [place], [place] [address]    Culture (A)  Final    STREPTOCOCCUS PNEUMONIAE SUSCEPTIBILITIES PERFORMED ON PREVIOUS CULTURE WITHIN THE LAST 5 DAYS. Performed at [facility], [place] [address], [place], [place] [address]    Report Status 10/08/2019 FINAL  Final  Blood Culture ID Panel (Reflexed)     Status: Abnormal   Collection Time: 10/06/19 12:13 AM  Result Value Ref Range Status   Enterococcus species NOT DETECTED NOT DETECTED Final   Listeria monocytogenes NOT DETECTED NOT DETECTED Final   Staphylococcus species NOT DETECTED NOT DETECTED Final   Staphylococcus aureus (BCID) NOT DETECTED NOT DETECTED Final   Streptococcus species DETECTED (A) NOT DETECTED Final    Comment: CRITICAL RESULT CALLED TO, READ BACK BY AND VERIFIED WITH: WALID NAZARI ON 10/06/19 AT 1225 QSD    Streptococcus agalactiae NOT DETECTED NOT DETECTED Final   Streptococcus pneumoniae DETECTED (A) NOT DETECTED Final    Comment: CRITICAL RESULT CALLED TO, READ BACK BY AND VERIFIED WITH: WALID NAZARI ON 10/06/19 AT 1225 QSD    Streptococcus pyogenes NOT DETECTED NOT DETECTED Final   Acinetobacter baumannii NOT DETECTED NOT DETECTED Final   Enterobacteriaceae species NOT DETECTED NOT  DETECTED Final   Enterobacter cloacae complex NOT DETECTED NOT DETECTED Final   Escherichia coli NOT DETECTED NOT DETECTED Final   Klebsiella oxytoca NOT DETECTED NOT DETECTED Final   Klebsiella pneumoniae NOT DETECTED NOT DETECTED Final   Proteus species NOT DETECTED NOT DETECTED Final   Serratia marcescens NOT DETECTED NOT DETECTED Final   Haemophilus influenzae NOT DETECTED NOT DETECTED Final   Neisseria meningitidis NOT DETECTED NOT DETECTED Final   Pseudomonas aeruginosa NOT DETECTED NOT DETECTED Final   Candida albicans NOT DETECTED NOT DETECTED Final   Candida glabrata NOT DETECTED NOT DETECTED Final   Candida krusei NOT DETECTED NOT DETECTED Final   Candida parapsilosis NOT DETECTED NOT DETECTED Final   Candida tropicalis NOT DETECTED NOT DETECTED Final    Comment: Performed at [facility], [address] [place], [place] [address]  Urine Culture     Status: None   Collection Time: 10/06/19 12:51 AM   Specimen: Urine, Random  Result Value Ref Range Status   Specimen Description   Final    URINE, RANDOM Performed at [facility], [address] [place], [place] [address]    Special Requests   Final  NONE Performed at [facility], [address], [place], [place] [address]    Culture   Final    NO GROWTH Performed at [facility], [place] [address], [place], [place] [address]    Report Status 10/07/2019 FINAL  Final  SARS CORONAVIRUS 2 (TAT 6-24 HRS) Nasopharyngeal Nasopharyngeal Swab     Status: None   Collection Time: 10/06/19  2:30 AM   Specimen: Nasopharyngeal Swab  Result Value Ref Range Status   SARS Coronavirus 2 NEGATIVE NEGATIVE Final    Comment: (NOTE) SARS-CoV-2 target nucleic acids are NOT DETECTED. The SARS-CoV-2 RNA is generally detectable in upper and lower respiratory specimens during the acute phase of infection. Negative results do not preclude SARS-CoV-2 infection, do not rule out co-infections with other  pathogens, and should not be used as the sole basis for treatment or other patient management decisions. Negative results must be combined with clinical observations, patient history, and epidemiological information. The expected result is Negative. Fact Sheet for Patients: SugarRoll.be Fact Sheet for Healthcare Providers: https://www.woods-mathews.com/ This test is not yet approved or cleared by the [place] FDA and  has been authorized for detection and/or diagnosis of SARS-CoV-2 by FDA under an Emergency Use Authorization (EUA). This EUA will remain  in effect (meaning this test can be used) for the duration of the COVID-19 declaration under Section 56 4(b)(1) of the Act, 21 U.S.C. section 360bbb-3(b)(1), unless the authorization is terminated or revoked sooner. Performed at [facility], [place] [address] [place], [place] [address]   Respiratory Panel by PCR     Status: None   Collection Time: 10/06/19  3:40 AM   Specimen: Nasopharyngeal Swab; Respiratory  Result Value Ref Range Status   Adenovirus NOT DETECTED NOT DETECTED Final   Coronavirus 229E NOT DETECTED NOT DETECTED Final    Comment: (NOTE) The Coronavirus on the Respiratory Panel, DOES NOT test for the novel  Coronavirus (2019 nCoV)    Coronavirus HKU1 NOT DETECTED NOT DETECTED Final   Coronavirus NL63 NOT DETECTED NOT DETECTED Final   Coronavirus OC43 NOT DETECTED NOT DETECTED Final   Metapneumovirus NOT DETECTED NOT DETECTED Final   Rhinovirus / Enterovirus NOT DETECTED NOT DETECTED Final   Influenza A NOT DETECTED NOT DETECTED Final   Influenza B NOT DETECTED NOT DETECTED Final   Parainfluenza Virus 1 NOT DETECTED NOT DETECTED Final   Parainfluenza Virus 2 NOT DETECTED NOT DETECTED Final   Parainfluenza Virus 3 NOT DETECTED NOT DETECTED Final   Parainfluenza Virus 4 NOT DETECTED NOT DETECTED Final   Respiratory Syncytial Virus NOT DETECTED NOT DETECTED Final    Bordetella pertussis NOT DETECTED NOT DETECTED Final   Chlamydophila pneumoniae NOT DETECTED NOT DETECTED Final   Mycoplasma pneumoniae NOT DETECTED NOT DETECTED Final    Comment: Performed at [facility], [place], [place], [place] [address]  MRSA PCR Screening     Status: None   Collection Time: 10/06/19  3:41 AM   Specimen: Nasopharyngeal  Result Value Ref Range Status   MRSA by PCR NEGATIVE NEGATIVE Final    Comment:        The GeneXpert MRSA Assay (FDA approved for NASAL specimens only), is one component of a comprehensive MRSA colonization surveillance program. It is not intended to diagnose MRSA infection nor to guide or monitor treatment for MRSA infections. Performed at [facility], [address] [place], [place] [address]   CULTURE, BLOOD (ROUTINE X 2) w Reflex to  ID Panel     Status: None (Preliminary result)   Collection Time: 10/07/19  1:17 AM   Specimen: BLOOD  Result Value Ref Range Status   Specimen Description BLOOD LEFT HAND  Final   Special Requests   Final    BOTTLES DRAWN AEROBIC AND ANAEROBIC Blood Culture adequate volume   Culture   Final    NO GROWTH 3 DAYS Performed at [facility], [address], [place], [place] [address]    Report Status PENDING  Incomplete  CULTURE, BLOOD (ROUTINE X 2) w Reflex to ID Panel     Status: None (Preliminary result)   Collection Time: 10/07/19  1:17 AM   Specimen: BLOOD  Result Value Ref Range Status   Specimen Description BLOOD LEFT WRIST  Final   Special Requests   Final    BOTTLES DRAWN AEROBIC AND ANAEROBIC Blood Culture adequate volume   Culture   Final    NO GROWTH 3 DAYS Performed at [facility], [address] [place], [place] [address]    Report Status PENDING  Incomplete  CSF culture     Status: None (Preliminary result)   Collection Time: 10/07/19  1:22 PM   Specimen: PATH Cytology CSF; Cerebrospinal Fluid  Result Value Ref Range Status   Specimen  Description   Final    CSF Performed at [facility] [address] [place], [place] [address]    Special Requests   Final    NONE Performed at [facility], [address] [place], [place] [address]    Gram Stain   Final    NO ORGANISMS SEEN WBC SEEN RED BLOOD CELLS RESULT CALLED TO, READ BACK BY AND VERIFIED WITH: MEGAN SCHEFFIELD 10/07/19 @ 1526  Evaro Performed at [facility], [address], [place], [place] [address]    Culture   Final    NO GROWTH 3 DAYS Performed at [facility], [place] [address], [place], [place] [address]    Report Status PENDING  Incomplete  Fungus Culture With Stain     Status: None (Preliminary result)   Collection Time: 10/07/19  1:22 PM   Specimen: PATH Cytology CSF; Cerebrospinal Fluid  Result Value Ref Range Status   Fungus Stain Final report  Final    Comment: (NOTE) Performed At: [facility] [address] [place], [place] [id number] Rush Farmer MD DE:[id number]    Fungus (Mycology) Culture PENDING  Incomplete   Fungal Source CSF  Final    Comment: Performed at [facility], [address] [place],  [address]  Fungus Culture Result     Status: None   Collection Time: 10/07/19  1:22 PM  Result Value Ref Range Status   Result 1 Comment  Final    Comment: (NOTE) KOH/Calcofluor preparation:  no fungus observed. Performed At: [facility] [place], [place] [id number] Rush Farmer MD FO:[id number]           IMAGING    DG Chest Port 1 View  Result Date: 10/09/2019 CLINICAL DATA:  Endotracheal tube placement EXAM: PORTABLE CHEST 1 VIEW COMPARISON:  10/09/2019 FINDINGS: Endotracheal tube tip is 4 cm above the carina. Orogastric tube enters the stomach. Bilateral patchy pulmonary infiltrates persist. No worsening or new finding. IMPRESSION: Tubes remain well positioned. Persistent patchy bilateral pulmonary infiltrates. Electronically Signed   By:  Nelson Chimes M.D.   On: 10/09/2019 11:26    CBC    Component Value Date/Time  WBC 17.2 (H) 10/09/2019 0509   RBC 3.79 (L) 10/09/2019 0509   HGB 10.5 (L) 10/09/2019 0509   HCT 33.7 (L) 10/09/2019 0509   PLT 323 10/09/2019 0509   MCV 88.9 10/09/2019 0509   MCH 27.7 10/09/2019 0509   MCHC 31.2 10/09/2019 0509   RDW 16.8 (H) 10/09/2019 0509   LYMPHSABS 1.1 10/08/2019 0553   MONOABS 0.6 10/08/2019 0553   EOSABS 0.0 10/08/2019 0553   BASOSABS 0.0 10/08/2019 0553   BMP Latest Ref Rng & Units 10/10/2019 10/09/2019 10/08/2019  Glucose 70 - 99 mg/dL 198(H) 168(H) 222(H)  BUN 6 - 20 mg/dL 39(H) 33(H) 25(H)  Creatinine 0.44 - 1.00 mg/dL 0.75 0.84 0.90  Sodium 135 - 145 mmol/L 152(H) 155(H) 148(H)  Potassium 3.5 - 5.1 mmol/L 4.2 4.5 3.9  Chloride 98 - 111 mmol/L 114(H) 118(H) 114(H)  CO2 22 - 32 mmol/L '30 29 27  '$ Calcium 8.9 - 10.3 mg/dL 9.3 9.5 9.1      Indwelling Urinary Catheter continued, requirement due to   Reason to continue Indwelling Urinary Catheter strict Intake/Output monitoring for hemodynamic instability   Central Line/ continued, requirement due to  Reason to continue [employer] of central venous pressure or other hemodynamic parameters and poor IV access   Ventilator continued, requirement due to severe respiratory failure   Ventilator Sedation RASS 0 to -2      ASSESSMENT AND PLAN SYNOPSIS  Severe ACUTE Hypoxic and Hypercapnic Respiratory Failurefrom severe sepsis and severe meningitis with STREP PNEUMONIA LEADING to severe toxic metabolic encephalopathy     Severe ACUTE Hypoxic and Hypercapnic Respiratory Failure -continue Full MV support -continue Bronchodilator Therapy -Wean Fio2 and PEEP as tolerated -will perform SAT/SBT when respiratory parameters are met  SEVERE COPD EXACERBATION -continue IV steroids as prescribed -continue NEB THERAPY as prescribed  NEUROLOGY Severe brain infection and encephalopathy - intubated and sedated -  minimal sedation to achieve a RASS goal: -1 Wake up assessment pending   CARDIAC ICU monitoring  ID -continue IV abx as prescibed -follow up cultures  GI GI PROPHYLAXIS as indicated  NUTRITIONAL STATUS DIET-->TF's as tolerated Constipation protocol as indicated  ENDO - will use ICU hypoglycemic\Hyperglycemia protocol if indicated   ELECTROLYTES -follow labs as needed -replace as needed -pharmacy consultation and following   DVT/GI PRX ordered TRANSFUSIONS AS NEEDED MONITOR FSBS ASSESS the need for LABS as needed   Critical Care Time devoted to patient care services described in this note is 35 minutes.   Overall, patient is critically ill, prognosis is guarded.  Patient with Multiorgan failure and at high risk for cardiac arrest and death.   SAT/SBT when husband arrives  Corrin Parker, M.D.  [facility]  Medical Director [place] Director [facility] Cardio-Pulmonary Department

## 2019-10-10 NOTE — Progress Notes (Signed)
Video call with daughter.

## 2019-10-10 NOTE — Consult Note (Signed)
PHARMACY CONSULT NOTE - FOLLOW UP  Pharmacy Consult for Electrolyte Monitoring and Replacement   Recent Labs: Potassium (mmol/L)  Date Value  10/10/2019 4.2   Magnesium (mg/dL)  Date Value  10/10/2019 3.0 (H)   Calcium (mg/dL)  Date Value  10/10/2019 9.3   Albumin (g/dL)  Date Value  10/08/2019 2.7 (L)   Phosphorus (mg/dL)  Date Value  10/10/2019 4.4   Sodium (mmol/L)  Date Value  10/10/2019 152 (H)     Assessment: Pharmacy has been consulted to manage electrolytes for this 59 year-old female admitted  with pneumococcal bacteremia and purulent meningitis.  Patient has a PMH significant for prediabetes, hypertension, and COPD.  Renal function is improving and approaching baseline (Baseline Scr appears to be 0.77 to 0.91).  Patient was extubated 1/29.    Electrolytes Goal:  All electrolytes WNL Sodium trending down after q4 hour flushes.  Will transition to D5 @ 50 mL/hour since  extubating. No supplementation needed at this time.     Electrolytes with AM labs.  Glucose:  A1c 8.2.   As patient has 4 consecutive CBGs <180 and IV insulin drip rate <8 units/hour, per ICU Glycemic Control Protocol, will convert off insulin drip.   Will transition to Phase 3 protocol, and give Novolog 2-6 units q4 hours and Levemir 8 units BID, and overlap with insulin gtt for 2 more hours.     Bowel Regimen LBM 1/29.  Patient is post-extubation, and all PO constipation meds d/c'd.  Will continue to monitor.   Gerald Dexter ,PharmD 10/10/2019 3:56 PM

## 2019-10-10 NOTE — Progress Notes (Signed)
Inpatient Diabetes Program Recommendations  AACE/ADA: New Consensus Statement on Inpatient Glycemic Control (2015)  Target Ranges:  Prepandial:   less than 140 mg/dL      Peak postprandial:   less than 180 mg/dL (1-2 hours)      Critically ill patients:  140 - 180 mg/dL   Results for Cathy Hughes, Cathy Hughes (MRN [id number]) as of 10/10/2019 07:55  Ref. Range 10/10/2019 00:43 10/10/2019 01:32 10/10/2019 03:29 10/10/2019 05:26 10/10/2019 07:34  Glucose-Capillary Latest Ref Range: 70 - 99 mg/dL 147 (H)  1.9 units/hr 145 (H)  2.2 units/hr 170 (H)  3 units/hr 167 (H)  3 units/hr 202 (H)    Admit with: Pre-Diabetes (Now with Type 2 Diabetes this admission)  Home DM Meds: None  Current Orders: IV Insulin Drip     Remains on Vent.  Tube feeds (Vital High Protein) running 25cc/hr.    MD- Note Decadron stopped--Last dose given at 11:46pm last night.  Transition criteria for the ICU Glycemic Control Protocol orders have been met: 4 consecutive CBGs <180 IV Insulin Drip rate is <8 units/hour Tube Feeds are at stable rate  Recommend we move to Phase 3 of transition to SQ Insulin per the ICU Glycemic Control Protocol Order set      --Will follow patient during hospitalization--  Wyn Quaker RN, MSN, CDE Diabetes Coordinator Inpatient Glycemic Control Team Team Pager: [phone] (8a-5p)

## 2019-10-10 NOTE — Progress Notes (Signed)
ID  Patient Vitals for the past 24 hrs:  BP Temp Temp src Pulse Resp SpO2 Weight  10/10/19 1107 -- -- -- -- -- 97 % --  10/10/19 1100 -- -- -- 65 16 97 % --  10/10/19 1000 (!) 168/87 -- -- 68 17 97 % --  10/10/19 0907 -- 97.9 F (36.6 C) Axillary 74 18 100 % --  10/10/19 0900 -- -- -- 78 18 100 % --  10/10/19 0800 (!) 170/88 -- -- 75 18 95 % --  10/10/19 0729 -- -- -- -- -- 96 % --  10/10/19 0700 (!) 165/90 -- -- 70 17 97 % --  10/10/19 0600 (!) 144/81 -- -- 73 18 97 % --  10/10/19 0500 122/65 -- -- 82 17 96 % 106 kg  10/10/19 0422 -- -- -- -- -- 96 % --  10/10/19 0400 113/70 99 F (37.2 C) Axillary 74 19 95 % --  10/10/19 0300 117/72 -- -- 75 19 93 % --  10/10/19 0200 115/69 -- -- 77 16 92 % --  10/10/19 0100 105/72 98.9 F (37.2 C) Axillary 81 19 94 % --  10/10/19 0020 -- -- -- -- -- 95 % --  10/09/19 2300 110/69 -- -- 88 18 95 % --  10/09/19 2200 123/72 -- -- 87 19 95 % --  10/09/19 2100 120/73 -- -- 88 19 95 % --  10/09/19 2038 -- -- -- -- -- 94 % --  10/09/19 2000 120/70 98.6 F (37 C) Axillary 88 19 93 % --  10/09/19 1900 113/64 -- -- 92 (!) 21 95 % --  10/09/19 1600 116/66 -- -- 94 18 94 % --  10/09/19 1555 -- 97.7 F (36.5 C) -- -- -- -- --  10/09/19 1500 117/66 -- -- (!) 102 19 94 % --  10/09/19 1449 -- -- -- -- -- 94 % --  10/09/19 1400 120/74 -- -- 96 20 93 % --  10/09/19 1300 115/71 -- -- 98 15 93 % --  10/09/19 1216 -- 98.6 F (37 C) Axillary (!) 110 (!) 29 93 % --  10/09/19 1200 137/81 -- -- 100 18 93 % --    Intubated Off sedation Chemosis conjunctiva PERL B/l air entry HS [id number] Left midline foley   CBC Latest Ref Rng & Units 10/09/2019 10/08/2019 10/07/2019  WBC 4.0 - 10.5 K/uL 17.2(H) 19.6(H) 25.1(H)  Hemoglobin 12.0 - 15.0 g/dL 10.5(L) 10.9(L) 11.9(L)  Hematocrit 36.0 - 46.0 % 33.7(L) 34.3(L) 37.1  Platelets 150 - 400 K/uL 323 343 356      CMP Latest Ref Rng & Units 10/10/2019 10/09/2019 10/08/2019  Glucose 70 - 99 mg/dL 198(H) 168(H) 222(H)   BUN 6 - 20 mg/dL 39(H) 33(H) 25(H)  Creatinine 0.44 - 1.00 mg/dL 0.75 0.84 0.90  Sodium 135 - 145 mmol/L 152(H) 155(H) 148(H)  Potassium 3.5 - 5.1 mmol/L 4.2 4.5 3.9  Chloride 98 - 111 mmol/L 114(H) 118(H) 114(H)  CO2 22 - 32 mmol/L 30 29 27   Calcium 8.9 - 10.3 mg/dL 9.3 9.5 9.1  Total Protein 6.5 - 8.1 g/dL - - 7.0  Total Bilirubin 0.3 - 1.2 mg/dL - - 0.5  Alkaline Phos 38 - 126 U/L - - 55  AST 15 - 41 U/L - - 21  ALT 0 - 44 U/L - - 23    Impression/Recommendation  Strep pneumo bacteremia and bacterial meningitis Imaging did not reveal any mastoid/sinus issues eventhough patient had ear pain  On ceftriaxone 2  grams IV q12 Decadron ( needed only for a total of 4 days) TEE is needed-  Respiratory failure- intubated  Remove foley and change to purewic Discussed with her nurse and her husband  ID will follow peripherally this weekend

## 2019-10-10 NOTE — Progress Notes (Signed)
Assisted tele visit to patient with family member.  Nadiyah Zeis Ann, RN  

## 2019-10-10 NOTE — Progress Notes (Signed)
   10/10/19 1600  Clinical Encounter Type  Visited With Patient  Visit Type Initial  Referral From Chaplain  Consult/Referral To Chaplain  The first time chaplain stopped to visit with patient, someone was talking to her and her husband via [place]. Chaplain told patient's husband that she would come back. Chaplain went back to visit with patient and her husband, but husband was not there. Therefore Chaplain sat by patient's bed and silently prayed.

## 2019-10-10 NOTE — Progress Notes (Signed)
Pt extubated per Dr. Mortimer Fries to 3 liters.  During weaning trial patient tolerated the pressure support mode- but never followed commands.  ABG was ordered and per the results Dr. Mortimer Fries ordered for extubation.  Husband at bedside.

## 2019-10-10 NOTE — Progress Notes (Signed)
Patient extubated per MD order with no complications. Placed on 3l [place].

## 2019-10-11 LAB — CSF CULTURE W GRAM STAIN
Culture: NO GROWTH
Gram Stain: NONE SEEN

## 2019-10-11 LAB — GLUCOSE, CAPILLARY
Glucose-Capillary: 106 mg/dL — ABNORMAL HIGH (ref 70–99)
Glucose-Capillary: 156 mg/dL — ABNORMAL HIGH (ref 70–99)
Glucose-Capillary: 123 mg/dL — ABNORMAL HIGH (ref 70–99)
Glucose-Capillary: 112 mg/dL — ABNORMAL HIGH (ref 70–99)
Glucose-Capillary: 125 mg/dL — ABNORMAL HIGH (ref 70–99)

## 2019-10-11 LAB — CBC
HCT: 36.3 % (ref 36.0–46.0)
Hemoglobin: 11.9 g/dL — ABNORMAL LOW (ref 12.0–15.0)
MCH: 28 pg (ref 26.0–34.0)
MCHC: 32.8 g/dL (ref 30.0–36.0)
MCV: 85.4 fL (ref 80.0–100.0)
Platelets: 362 10*3/uL (ref 150–400)
RBC: 4.25 MIL/uL (ref 3.87–5.11)
RDW: 15.9 % — ABNORMAL HIGH (ref 11.5–15.5)
WBC: 13.1 10*3/uL — ABNORMAL HIGH (ref 4.0–10.5)
nRBC: 0.7 % — ABNORMAL HIGH (ref 0.0–0.2)

## 2019-10-11 LAB — BASIC METABOLIC PANEL
Anion gap: 10 (ref 5–15)
BUN: 34 mg/dL — ABNORMAL HIGH (ref 6–20)
CO2: 33 mmol/L — ABNORMAL HIGH (ref 22–32)
Calcium: 9.4 mg/dL (ref 8.9–10.3)
Chloride: 113 mmol/L — ABNORMAL HIGH (ref 98–111)
Creatinine, Ser: 0.76 mg/dL (ref 0.44–1.00)
GFR calc Af Amer: >60 mL/min (ref >60)
GFR calc non Af Amer: >60 mL/min (ref >60)
Glucose, Bld: 145 mg/dL — ABNORMAL HIGH (ref 70–99)
Potassium: 2.9 mmol/L — ABNORMAL LOW (ref 3.5–5.1)
Sodium: 156 mmol/L — ABNORMAL HIGH (ref 135–145)

## 2019-10-11 LAB — POTASSIUM: Potassium: 3.3 mmol/L — ABNORMAL LOW (ref 3.5–5.1)

## 2019-10-11 LAB — PHOSPHORUS
Phosphorus: 2.2 mg/dL — ABNORMAL LOW (ref 2.5–4.6)
Phosphorus: 3.9 mg/dL (ref 2.5–4.6)

## 2019-10-11 LAB — MAGNESIUM: Magnesium: 2.3 mg/dL (ref 1.7–2.4)

## 2019-10-11 MED ORDER — POTASSIUM PHOSPHATES 15 MMOLE/5ML IV SOLN
30.0000 mmol | Freq: Once | INTRAVENOUS | Status: AC
  Administered 2019-10-11: 30 mmol via INTRAVENOUS
  Filled 2019-10-11: qty 10

## 2019-10-11 MED ORDER — DEXMEDETOMIDINE HCL IN NACL 400 MCG/100ML IV SOLN
0.4000 ug/kg/h | INTRAVENOUS | Status: DC
  Administered 2019-10-11 (×2): 0.6 ug/kg/h via INTRAVENOUS
  Administered 2019-10-11: 1 ug/kg/h via INTRAVENOUS
  Filled 2019-10-11 (×6): qty 100

## 2019-10-11 MED ORDER — DEXTROSE 5 % IV SOLN
500.0000 mg | INTRAVENOUS | Status: AC
  Administered 2019-10-11: 500 mg via INTRAVENOUS
  Filled 2019-10-11: qty 10

## 2019-10-11 NOTE — Progress Notes (Signed)
Assisted tele visit to patient with daughter.  Shelisa Fern Ann, RN  

## 2019-10-11 NOTE — Consult Note (Signed)
PHARMACY CONSULT NOTE - FOLLOW UP  Pharmacy Consult for Electrolyte Monitoring and Replacement   Recent Labs: Potassium (mmol/L)  Date Value  10/11/2019 2.9 (L)   Magnesium (mg/dL)  Date Value  10/11/2019 2.3   Calcium (mg/dL)  Date Value  10/11/2019 9.4   Albumin (g/dL)  Date Value  10/08/2019 2.7 (L)   Phosphorus (mg/dL)  Date Value  10/11/2019 2.2 (L)   Sodium (mmol/L)  Date Value  10/11/2019 156 (H)     Assessment: Pharmacy has been consulted to manage electrolytes for this 59 year-old female admitted  with pneumococcal bacteremia and purulent meningitis.  Patient has a PMH significant for prediabetes, hypertension, and COPD.  Renal function is improving and approaching baseline (Baseline Scr appears to be 0.77 to 0.91).  Patient was extubated 1/29.    Electrolytes K 2.9  Mag 2.3  Phos 2.2  Scr 0.76 Goal:  All electrolytes WNL Will order Potassium Phosphate 30 mmol IV (44 meq K+) x1. Will check K and Phos at 2000. Electrolytes with AM labs.  Glucose:  BG 106-202 over last 24 hrs.  A1c 8.2.  NPO on Novolog 0-15 units q4 hours and Levemir 8 units BID  Bowel Regimen LBM 1/29.  Patient is post-extubation, and all PO constipation meds d/c'd.  Will continue to monitor.   Areebah Meinders A ,PharmD 10/11/2019 9:49 AM

## 2019-10-11 NOTE — Progress Notes (Signed)
CRITICAL CARE NOTE BRIEF PATIENT DESCRIPTION: 59 yo female admitted with acute respiratory failure secondary to metabolic acidosis, acute toxic metabolic encephalopathy, and sepsis etiology secondary to meningitis requiring mechanical intubation  SIGNIFICANT EVENTS/STUDIES: 01/24: Pt arrived to the ER with c/o headache after taking oxycodone requiring admission to the stepdown unit 01/24: CT Head revealed no acute intracranial findings 01/25: Pt became minimally responsive after receiving low dose morphine and ativan, therefore PCCM consulted to assist with treatment plan. Pt subsequently required mechanical intubation due to acute respiratory failure 1/25 3/4 blood cultures +STREP PNEUMONIA, husband updated 1/26 status post LP status post CT of the head status post MRI Remains on the vent, brain infection Daughter and husband updated 1/27 failed SAT/SBT 1/28 failed SAT/SBT due to encephalopathy and severe resp failure 1/29 s/p extubation, placed on biPAP, high risk for re-intubation   MICRO DATA: Rapid COVID-19 01/24>>negative  Blood x2 01/25>> Urine 01/25>> COVID-1901/24>>  ANTIMICROBIALS: Cefepime x1 dose 01/24 Metronidazole x1 dose 01/24 Ceftriaxone 01/25>> Ampicillin 01/25>>1/26 Vancomycin 01/25>>1/27    CC  follow up respiratory failure  SUBJECTIVE Patient remains critically ill Prognosis is guarded On biPAP and precedex   BP (!) 143/73   Pulse 75   Temp 98.2 F (36.8 C) (Axillary)   Resp 14   Ht 5\' 5"  (1.651 m)   Wt 106 kg   LMP 09/30/2014   SpO2 93%   BMI 38.89 kg/m    I/O last 3 completed shifts: In: 3679.3 [I.V.:2162.9; NG/GT:1066.3; IV Piggyback:450.1] Out: [id number] [Urine:5425; Stool:200] No intake/output data recorded.  SpO2: 93 % O2 Flow Rate (L/min): 3 L/min FiO2 (%): 36 %   SIGNIFICANT EVENTS   REVIEW OF SYSTEMS  PATIENT IS UNABLE TO PROVIDE COMPLETE REVIEW OF SYSTEMS DUE TO SEVERE CRITICAL ILLNESS   PHYSICAL  EXAMINATION:  GENERAL:critically ill appearing, +resp distress HEAD: Normocephalic, atraumatic.  EYES: Pupils equal, round, reactive to light.  No scleral icterus.  MOUTH: Moist mucosal membrane. NECK: Supple.  PULMONARY: +rhonchi, +wheezing CARDIOVASCULAR: S1 and S2. Regular rate and rhythm. No murmurs, rubs, or gallops.  GASTROINTESTINAL: Soft, nontender, -distended.  Positive bowel sounds.   MUSCULOSKELETAL: No swelling, clubbing, or edema.  NEUROLOGIC: obtunded, GCS<8 SKIN:intact,warm,dry  MEDICATIONS: I have reviewed all medications and confirmed regimen as documented   CULTURE RESULTS   Recent Results (from the past 240 hour(s))  Culture, blood (routine x 2)     Status: Abnormal   Collection Time: 10/06/19 12:13 AM   Specimen: BLOOD  Result Value Ref Range Status   Specimen Description   Final    BLOOD LEFT HAND Performed at [facility], [address] [place], [place] [address]    Special Requests   Final    BOTTLES DRAWN AEROBIC AND ANAEROBIC Blood Culture results may not be optimal due to an inadequate volume of blood received in culture bottles Performed at [facility], [address] [place], [place] [address]    Culture  Setup Time   Final    GRAM POSITIVE COCCI IN BOTH AEROBIC AND ANAEROBIC BOTTLES CRITICAL RESULT CALLED TO, READ BACK BY AND VERIFIED WITH: Rito Ehrlich ON 10/06/19 AT 78 QSD Performed at [facility], [place] [address], [place], [place] [address]    Culture STREPTOCOCCUS PNEUMONIAE (A)  Final   Report Status 10/08/2019 FINAL  Final   Organism ID, Bacteria STREPTOCOCCUS PNEUMONIAE  Final      Susceptibility   Streptococcus pneumoniae - MIC*    ERYTHROMYCIN >=8 RESISTANT Resistant  LEVOFLOXACIN 0.5 SENSITIVE Sensitive     VANCOMYCIN 0.25 SENSITIVE Sensitive     PENICILLIN (non-meningitis) 0.25 SENSITIVE Sensitive     PENICILLIN (oral) 0.25 INTERMEDIATE Intermediate     CEFTRIAXONE (non-meningitis) 0.25 SENSITIVE  Sensitive     * STREPTOCOCCUS PNEUMONIAE  Culture, blood (routine x 2)     Status: Abnormal   Collection Time: 10/06/19 12:13 AM   Specimen: BLOOD  Result Value Ref Range Status   Specimen Description   Final    BLOOD RIGHT HAND Performed at [facility], [address], [place], [place] [address]    Special Requests   Final    BOTTLES DRAWN AEROBIC AND ANAEROBIC Blood Culture results may not be optimal due to an inadequate volume of blood received in culture bottles Performed at [facility], [address], [place], [place] [address]    Culture  Setup Time   Final    GRAM POSITIVE COCCI IN BOTH AEROBIC AND ANAEROBIC BOTTLES CRITICAL RESULT CALLED TO, READ BACK BY AND VERIFIED WITH: LISA KLUTTZ ON 10/06/19 AT 1032 QSD Performed at [facility], [address] [place], [place] [address]    Culture (A)  Final    STREPTOCOCCUS PNEUMONIAE SUSCEPTIBILITIES PERFORMED ON PREVIOUS CULTURE WITHIN THE LAST 5 DAYS. Performed at [facility], [place] [address], [place], [place] [address]    Report Status 10/08/2019 FINAL  Final  Blood Culture ID Panel (Reflexed)     Status: Abnormal   Collection Time: 10/06/19 12:13 AM  Result Value Ref Range Status   Enterococcus species NOT DETECTED NOT DETECTED Final   Listeria monocytogenes NOT DETECTED NOT DETECTED Final   Staphylococcus species NOT DETECTED NOT DETECTED Final   Staphylococcus aureus (BCID) NOT DETECTED NOT DETECTED Final   Streptococcus species DETECTED (A) NOT DETECTED Final    Comment: CRITICAL RESULT CALLED TO, READ BACK BY AND VERIFIED WITH: WALID NAZARI ON 10/06/19 AT 1225 QSD    Streptococcus agalactiae NOT DETECTED NOT DETECTED Final   Streptococcus pneumoniae DETECTED (A) NOT DETECTED Final    Comment: CRITICAL RESULT CALLED TO, READ BACK BY AND VERIFIED WITH: WALID NAZARI ON 10/06/19 AT 1225 QSD    Streptococcus pyogenes NOT DETECTED NOT DETECTED Final   Acinetobacter baumannii NOT  DETECTED NOT DETECTED Final   Enterobacteriaceae species NOT DETECTED NOT DETECTED Final   Enterobacter cloacae complex NOT DETECTED NOT DETECTED Final   Escherichia coli NOT DETECTED NOT DETECTED Final   Klebsiella oxytoca NOT DETECTED NOT DETECTED Final   Klebsiella pneumoniae NOT DETECTED NOT DETECTED Final   Proteus species NOT DETECTED NOT DETECTED Final   Serratia marcescens NOT DETECTED NOT DETECTED Final   Haemophilus influenzae NOT DETECTED NOT DETECTED Final   Neisseria meningitidis NOT DETECTED NOT DETECTED Final   Pseudomonas aeruginosa NOT DETECTED NOT DETECTED Final   Candida albicans NOT DETECTED NOT DETECTED Final   Candida glabrata NOT DETECTED NOT DETECTED Final   Candida krusei NOT DETECTED NOT DETECTED Final   Candida parapsilosis NOT DETECTED NOT DETECTED Final   Candida tropicalis NOT DETECTED NOT DETECTED Final    Comment: Performed at [facility], [address], [place], [place] [address]  Urine Culture     Status: None   Collection Time: 10/06/19 12:51 AM   Specimen: Urine, Random  Result Value Ref Range Status   Specimen Description   Final    URINE, RANDOM Performed at [facility], [address], [place], [place] [address]  Special Requests   Final    NONE Performed at [facility], [address] [place], [place] [address]    Culture   Final    NO GROWTH Performed at [facility], [place] [address], [place], [place] [address]    Report Status 10/07/2019 FINAL  Final  SARS CORONAVIRUS 2 (TAT 6-24 HRS) Nasopharyngeal Nasopharyngeal Swab     Status: None   Collection Time: 10/06/19  2:30 AM   Specimen: Nasopharyngeal Swab  Result Value Ref Range Status   SARS Coronavirus 2 NEGATIVE NEGATIVE Final    Comment: (NOTE) SARS-CoV-2 target nucleic acids are NOT DETECTED. The SARS-CoV-2 RNA is generally detectable in upper and lower respiratory specimens during the acute phase of infection. Negative results do not  preclude SARS-CoV-2 infection, do not rule out co-infections with other pathogens, and should not be used as the sole basis for treatment or other patient management decisions. Negative results must be combined with clinical observations, patient history, and epidemiological information. The expected result is Negative. Fact Sheet for Patients: SugarRoll.be Fact Sheet for Healthcare Providers: https://www.woods-mathews.com/ This test is not yet approved or cleared by the [place] FDA and  has been authorized for detection and/or diagnosis of SARS-CoV-2 by FDA under an Emergency Use Authorization (EUA). This EUA will remain  in effect (meaning this test can be used) for the duration of the COVID-19 declaration under Section 56 4(b)(1) of the Act, 21 U.S.C. section 360bbb-3(b)(1), unless the authorization is terminated or revoked sooner. Performed at [facility], [place] [address], [place], [place] [address]   Respiratory Panel by PCR     Status: None   Collection Time: 10/06/19  3:40 AM   Specimen: Nasopharyngeal Swab; Respiratory  Result Value Ref Range Status   Adenovirus NOT DETECTED NOT DETECTED Final   Coronavirus 229E NOT DETECTED NOT DETECTED Final    Comment: (NOTE) The Coronavirus on the Respiratory Panel, DOES NOT test for the novel  Coronavirus (2019 nCoV)    Coronavirus HKU1 NOT DETECTED NOT DETECTED Final   Coronavirus NL63 NOT DETECTED NOT DETECTED Final   Coronavirus OC43 NOT DETECTED NOT DETECTED Final   Metapneumovirus NOT DETECTED NOT DETECTED Final   Rhinovirus / Enterovirus NOT DETECTED NOT DETECTED Final   Influenza A NOT DETECTED NOT DETECTED Final   Influenza B NOT DETECTED NOT DETECTED Final   Parainfluenza Virus 1 NOT DETECTED NOT DETECTED Final   Parainfluenza Virus 2 NOT DETECTED NOT DETECTED Final   Parainfluenza Virus 3 NOT DETECTED NOT DETECTED Final   Parainfluenza Virus 4 NOT DETECTED NOT DETECTED  Final   Respiratory Syncytial Virus NOT DETECTED NOT DETECTED Final   Bordetella pertussis NOT DETECTED NOT DETECTED Final   Chlamydophila pneumoniae NOT DETECTED NOT DETECTED Final   Mycoplasma pneumoniae NOT DETECTED NOT DETECTED Final    Comment: Performed at [facility], [place]. [address] [place], [place] [address]  MRSA PCR Screening     Status: None   Collection Time: 10/06/19  3:41 AM   Specimen: Nasopharyngeal  Result Value Ref Range Status   MRSA by PCR NEGATIVE NEGATIVE Final    Comment:        The GeneXpert MRSA Assay (FDA approved for NASAL specimens only), is one component of a comprehensive MRSA colonization surveillance program. It is not intended to diagnose MRSA infection nor to guide or monitor treatment for MRSA infections. Performed at [facility], [address], [place], [place] [address]  CULTURE, BLOOD (ROUTINE X 2) w Reflex to ID Panel     Status: None (Preliminary result)   Collection Time: 10/07/19  1:17 AM   Specimen: BLOOD  Result Value Ref Range Status   Specimen Description BLOOD LEFT HAND  Final   Special Requests   Final    BOTTLES DRAWN AEROBIC AND ANAEROBIC Blood Culture adequate volume   Culture   Final    NO GROWTH 4 DAYS Performed at [facility], [address] [place], [place] [address]    Report Status PENDING  Incomplete  CULTURE, BLOOD (ROUTINE X 2) w Reflex to ID Panel     Status: None (Preliminary result)   Collection Time: 10/07/19  1:17 AM   Specimen: BLOOD  Result Value Ref Range Status   Specimen Description BLOOD LEFT WRIST  Final   Special Requests   Final    BOTTLES DRAWN AEROBIC AND ANAEROBIC Blood Culture adequate volume   Culture   Final    NO GROWTH 4 DAYS Performed at [facility], [address] [place], [place] [address]    Report Status PENDING  Incomplete  CSF culture     Status: None   Collection Time: 10/07/19  1:22 PM   Specimen: PATH Cytology CSF; Cerebrospinal  Fluid  Result Value Ref Range Status   Specimen Description   Final    CSF Performed at [facility], [address], [place], [place] [address]    Special Requests   Final    NONE Performed at [facility], [address] [place], [place] [address]    Gram Stain   Final    NO ORGANISMS SEEN WBC SEEN RED BLOOD CELLS RESULT CALLED TO, READ BACK BY AND VERIFIED WITH: MEGAN SCHEFFIELD 10/07/19 @ 1526  Saline Performed at [facility], [address], [place], [place] [address]    Culture   Final    NO GROWTH 3 DAYS Performed at [facility], [place] [address], [place], [place] [address]    Report Status 10/11/2019 FINAL  Final  Fungus Culture With Stain     Status: None (Preliminary result)   Collection Time: 10/07/19  1:22 PM   Specimen: PATH Cytology CSF; Cerebrospinal Fluid  Result Value Ref Range Status   Fungus Stain Final report  Final    Comment: (NOTE) Performed At: [facility] [place], [place] [id number] Rush Farmer MD [id number]    Fungus (Mycology) Culture PENDING  Incomplete   Fungal Source CSF  Final    Comment: Performed at [facility], [address] [place], [place] [address]  Fungus Culture Result     Status: None   Collection Time: 10/07/19  1:22 PM  Result Value Ref Range Status   Result 1 Comment  Final    Comment: (NOTE) KOH/Calcofluor preparation:  no fungus observed. Performed At: [facility] [place], [place] [id number] Rush Farmer MD [id number]       CBC    Component Value Date/Time   WBC 13.1 (H) 10/11/2019 0607   RBC 4.25 10/11/2019 0607   HGB 11.9 (L) 10/11/2019 0607   HCT 36.3 10/11/2019 0607   PLT 362 10/11/2019 0607   MCV 85.4 10/11/2019 0607   MCH 28.0 10/11/2019 0607   MCHC 32.8 10/11/2019 0607   RDW 15.9 (H) 10/11/2019 0607   LYMPHSABS 1.1 10/08/2019 0553   MONOABS 0.6 10/08/2019 0553   EOSABS 0.0 10/08/2019 0553   BASOSABS  0.0 10/08/2019 0553   BMP Latest Ref Rng & Units 10/11/2019 10/10/2019 10/09/2019  Glucose 70 - 99 mg/dL 145(H) 198(H) 168(H)  BUN 6 - 20 mg/dL 34(H) 39(H) 33(H)  Creatinine 0.44 - 1.00 mg/dL 0.76 0.75 0.84  Sodium 135 - 145 mmol/L 156(H) 152(H) 155(H)  Potassium 3.5 - 5.1 mmol/L 2.9(L) 4.2 4.5  Chloride 98 - 111 mmol/L 113(H) 114(H) 118(H)  CO2 22 - 32 mmol/L 33(H) 30 29  Calcium 8.9 - 10.3 mg/dL 9.4 9.3 9.5      Indwelling Urinary Catheter continued, requirement due to   Reason to continue Indwelling Urinary Catheter strict Intake/Output monitoring for hemodynamic instability         Ventilator continued, requirement due to severe respiratory failure   Ventilator Sedation RASS 0 to -2      ASSESSMENT AND PLAN SYNOPSIS  Severe ACUTE Hypoxic and Hypercapnic Respiratory Failurefrom severe sepsis and severe meningitis with STREP PNEUMONIA LEADING to severe toxic metabolic encephalopathy   NEUROLOGY Severe encephalopathy High risk for intubation and cardiac arrest On precedex   CARDIAC ICU monitoring  ID -continue IV abx as prescibed -follow up cultures  GI GI PROPHYLAXIS as indicated  NUTRITIONAL STATUS DIET-->NPO Constipation protocol as indicated  ENDO - will use ICU hypoglycemic\Hyperglycemia protocol if indicated   ELECTROLYTES -follow labs as needed -replace as needed -pharmacy consultation and following   DVT/GI PRX ordered TRANSFUSIONS AS NEEDED MONITOR FSBS ASSESS the need for LABS as needed   Critical Care Time devoted to patient care services described in this note is 34 minutes.   Overall, patient is critically ill, prognosis is guarded.  Patient with Multiorgan failure and at high risk for cardiac arrest and death.    Corrin Parker, M.D.  [facility]  Medical Director [place] [facility] Cardio-Pulmonary Department

## 2019-10-12 LAB — CBC WITH DIFFERENTIAL/PLATELET
Abs Immature Granulocytes: 0.14 10*3/uL — ABNORMAL HIGH (ref 0.00–0.07)
Basophils Absolute: 0 10*3/uL (ref 0.0–0.1)
Basophils Relative: 0 %
Eosinophils Absolute: 0.1 10*3/uL (ref 0.0–0.5)
Eosinophils Relative: 0 %
HCT: 37.9 % (ref 36.0–46.0)
Hemoglobin: 12.2 g/dL (ref 12.0–15.0)
Immature Granulocytes: 1 %
Lymphocytes Relative: 24 %
Lymphs Abs: 3.1 10*3/uL (ref 0.7–4.0)
MCH: 27.5 pg (ref 26.0–34.0)
MCHC: 32.2 g/dL (ref 30.0–36.0)
MCV: 85.6 fL (ref 80.0–100.0)
Monocytes Absolute: 0.6 10*3/uL (ref 0.1–1.0)
Monocytes Relative: 5 %
Neutro Abs: 8.7 10*3/uL — ABNORMAL HIGH (ref 1.7–7.7)
Neutrophils Relative %: 70 %
Platelets: 337 10*3/uL (ref 150–400)
RBC: 4.43 MIL/uL (ref 3.87–5.11)
RDW: 15.9 % — ABNORMAL HIGH (ref 11.5–15.5)
WBC: 12.6 10*3/uL — ABNORMAL HIGH (ref 4.0–10.5)
nRBC: 0.2 % (ref 0.0–0.2)

## 2019-10-12 LAB — BASIC METABOLIC PANEL
Anion gap: 10 (ref 5–15)
BUN: 26 mg/dL — ABNORMAL HIGH (ref 6–20)
CO2: 32 mmol/L (ref 22–32)
Calcium: 8.6 mg/dL — ABNORMAL LOW (ref 8.9–10.3)
Chloride: 111 mmol/L (ref 98–111)
Creatinine, Ser: 0.76 mg/dL (ref 0.44–1.00)
GFR calc Af Amer: >60 mL/min (ref >60)
GFR calc non Af Amer: >60 mL/min (ref >60)
Glucose, Bld: 101 mg/dL — ABNORMAL HIGH (ref 70–99)
Potassium: 3.2 mmol/L — ABNORMAL LOW (ref 3.5–5.1)
Sodium: 153 mmol/L — ABNORMAL HIGH (ref 135–145)

## 2019-10-12 LAB — MAGNESIUM: Magnesium: 2.6 mg/dL — ABNORMAL HIGH (ref 1.7–2.4)

## 2019-10-12 LAB — CULTURE, BLOOD (ROUTINE X 2)
Culture: NO GROWTH
Culture: NO GROWTH
Special Requests: ADEQUATE
Special Requests: ADEQUATE

## 2019-10-12 LAB — GLUCOSE, CAPILLARY
Glucose-Capillary: 100 mg/dL — ABNORMAL HIGH (ref 70–99)
Glucose-Capillary: 123 mg/dL — ABNORMAL HIGH (ref 70–99)
Glucose-Capillary: 123 mg/dL — ABNORMAL HIGH (ref 70–99)
Glucose-Capillary: 84 mg/dL (ref 70–99)
Glucose-Capillary: 95 mg/dL (ref 70–99)
Glucose-Capillary: 98 mg/dL (ref 70–99)

## 2019-10-12 LAB — PHOSPHORUS: Phosphorus: 3.4 mg/dL (ref 2.5–4.6)

## 2019-10-12 MED ORDER — POTASSIUM CHLORIDE 10 MEQ/100ML IV SOLN
10.0000 meq | INTRAVENOUS | Status: AC
  Administered 2019-10-12 (×4): 10 meq via INTRAVENOUS
  Filled 2019-10-12 (×4): qty 100

## 2019-10-12 NOTE — Progress Notes (Signed)
Assisted tele visit to patient with family member.  Cathy Alberico McEachran, RN  

## 2019-10-12 NOTE — Progress Notes (Signed)
Assisted tele visit to patient with family members.  Narvel Kozub Ann, RN  

## 2019-10-12 NOTE — Progress Notes (Signed)
Good day. Talked to the whole family several times today. Alert and oriented. Weaned off of Precedex without issues . Alert and oriented. Got choked on phlegm and vomitted approximately 100 ml of yellow fluid. Cleaned up and bathed.

## 2019-10-12 NOTE — Progress Notes (Signed)
CRITICAL CARE NOTE BRIEF PATIENT DESCRIPTION: 59 yo female admitted with acute respiratory failure secondary to metabolic acidosis, acute toxic metabolic encephalopathy, and sepsis etiology secondary to meningitis requiring mechanical intubation  SIGNIFICANT EVENTS/STUDIES: 01/24: Pt arrived to the ER with c/o headache after taking oxycodone requiring admission to the stepdown unit 01/24: CT Head revealed no acute intracranial findings 01/25: Pt became minimally responsive after receiving low dose morphine and ativan, therefore PCCM consulted to assist with treatment plan. Pt subsequently required mechanical intubation due to acute respiratory failure 1/25 3/4 blood cultures +STREP PNEUMONIA, husband updated 1/26 status post LP status post CT of the head status post MRI Remains on the vent, brain infection Daughter and husband updated 1/27 failed SAT/SBT 1/28 failed SAT/SBT due to encephalopathy and severe resp failure 1/29 s/p extubation, placed on biPAP, high risk for re-intubation 1/31 alert and awake, follows commands, resp failure resolved  MICRO DATA: Rapid COVID-19 01/24>>negative  Blood x2 01/25>> Urine 01/25>> COVID-1901/24>>  ANTIMICROBIALS: Cefepime x1 dose 01/24 Metronidazole x1 dose 01/24 Ceftriaxone 01/25>> Ampicillin 01/25>>1/26 Vancomycin 01/25>>1/27    CC  Follow up resp failure  SUBJECTIVE resp failure resolved Alert and awake following commands Off precedex Needs PT   BP (!) 152/88   Pulse 74   Temp 98.6 F (37 C)   Resp 12   Ht 5\' 5"  (1.651 m)   Wt 106 kg   LMP 09/30/2014   SpO2 94%   BMI 38.89 kg/m    I/O last 3 completed shifts: In: 1052.8 [I.V.:654; IV Piggyback:398.8] Out: 3400 [Urine:3200; Stool:200] Total I/O In: 100 [IV Piggyback:100] Out: 350 [Urine:350]  SpO2: 94 % O2 Flow Rate (L/min): 5 L/min FiO2 (%): 36 %     Review of Systems:  Gen:  Denies  fever, sweats, chills weight loss  HEENT: Denies blurred  vision, double vision, ear pain, eye pain, hearing loss, nose bleeds, sore throat Cardiac:  No dizziness, chest pain or heaviness, chest tightness,edema, No JVD Resp:   No cough, -sputum production, -shortness of breath,-wheezing, -hemoptysis,  Gi: Denies swallowing difficulty, stomach pain, nausea or vomiting, Other:  All other systems negative   Physical Examination:   GENERAL:NAD, no fevers, chills, no weakness no fatigue HEAD: Normocephalic, atraumatic.  EYES: PERLA, EOMI No scleral icterus.  MOUTH: Moist mucosal membrane.  EAR, NOSE, THROAT: Clear without exudates. No external lesions.  NECK: Supple. No thyromegaly.  No JVD.  PULMONARY: CTA B/L no wheezing, rhonchi, crackles CARDIOVASCULAR: S1 and S2. Regular rate and rhythm. No murmurs GASTROINTESTINAL: Soft, nontender, nondistended. Positive bowel sounds.  MUSCULOSKELETAL: No swelling, clubbing, or edema.  NEUROLOGIC: No gross focal neurological deficits. 5/5 strength all extremities SKIN: No ulceration, lesions, rashes, or cyanosis.  PSYCHIATRIC: Insight, judgment intact. -depression -anxiety ALL OTHER ROS ARE NEGATIVE      MEDICATIONS: I have reviewed all medications and confirmed regimen as documented   CULTURE RESULTS   Recent Results (from the past 240 hour(s))  Culture, blood (routine x 2)     Status: Abnormal   Collection Time: 10/06/19 12:13 AM   Specimen: BLOOD  Result Value Ref Range Status   Specimen Description   Final    BLOOD LEFT HAND Performed at [facility], [address], [place], [place] [address]    Special Requests   Final    BOTTLES DRAWN AEROBIC AND ANAEROBIC Blood Culture results may not be optimal due to an inadequate volume of blood received in culture bottles Performed at [facility], [address] [place],  [place] [address]    Culture  Setup Time   Final    GRAM POSITIVE COCCI IN BOTH AEROBIC AND ANAEROBIC BOTTLES CRITICAL RESULT CALLED TO, READ BACK BY AND  VERIFIED WITH: WALID NAZARI ON 10/06/19 AT 1225 QSD Performed at [facility], [place] [address] [place], [place] [address]    Culture STREPTOCOCCUS PNEUMONIAE (A)  Final   Report Status 10/08/2019 FINAL  Final   Organism ID, Bacteria STREPTOCOCCUS PNEUMONIAE  Final      Susceptibility   Streptococcus pneumoniae - MIC*    ERYTHROMYCIN >=8 RESISTANT Resistant     LEVOFLOXACIN 0.5 SENSITIVE Sensitive     VANCOMYCIN 0.25 SENSITIVE Sensitive     PENICILLIN (non-meningitis) 0.25 SENSITIVE Sensitive     PENICILLIN (oral) 0.25 INTERMEDIATE Intermediate     CEFTRIAXONE (non-meningitis) 0.25 SENSITIVE Sensitive     * STREPTOCOCCUS PNEUMONIAE  Culture, blood (routine x 2)     Status: Abnormal   Collection Time: 10/06/19 12:13 AM   Specimen: BLOOD  Result Value Ref Range Status   Specimen Description   Final    BLOOD RIGHT HAND Performed at [facility], [address], [place], [place] [address]    Special Requests   Final    BOTTLES DRAWN AEROBIC AND ANAEROBIC Blood Culture results may not be optimal due to an inadequate volume of blood received in culture bottles Performed at [facility], [address] [place], [place] [address]    Culture  Setup Time   Final    GRAM POSITIVE COCCI IN BOTH AEROBIC AND ANAEROBIC BOTTLES CRITICAL RESULT CALLED TO, READ BACK BY AND VERIFIED WITH: LISA KLUTTZ ON 10/06/19 AT 1032 QSD Performed at [facility], [address] [place], [place] [address]    Culture (A)  Final    STREPTOCOCCUS PNEUMONIAE SUSCEPTIBILITIES PERFORMED ON PREVIOUS CULTURE WITHIN THE LAST 5 DAYS. Performed at [facility], [place] [address], [place], [place] [address]    Report Status 10/08/2019 FINAL  Final  Blood Culture ID Panel (Reflexed)     Status: Abnormal   Collection Time: 10/06/19 12:13 AM  Result Value Ref Range Status   Enterococcus species NOT DETECTED NOT DETECTED Final   Listeria monocytogenes NOT DETECTED NOT DETECTED Final    Staphylococcus species NOT DETECTED NOT DETECTED Final   Staphylococcus aureus (BCID) NOT DETECTED NOT DETECTED Final   Streptococcus species DETECTED (A) NOT DETECTED Final    Comment: CRITICAL RESULT CALLED TO, READ BACK BY AND VERIFIED WITH: WALID NAZARI ON 10/06/19 AT 1225 QSD    Streptococcus agalactiae NOT DETECTED NOT DETECTED Final   Streptococcus pneumoniae DETECTED (A) NOT DETECTED Final    Comment: CRITICAL RESULT CALLED TO, READ BACK BY AND VERIFIED WITH: WALID NAZARI ON 10/06/19 AT 1225 QSD    Streptococcus pyogenes NOT DETECTED NOT DETECTED Final   Acinetobacter baumannii NOT DETECTED NOT DETECTED Final   Enterobacteriaceae species NOT DETECTED NOT DETECTED Final   Enterobacter cloacae complex NOT DETECTED NOT DETECTED Final   Escherichia coli NOT DETECTED NOT DETECTED Final   Klebsiella oxytoca NOT DETECTED NOT DETECTED Final   Klebsiella pneumoniae NOT DETECTED NOT DETECTED Final   Proteus species NOT DETECTED NOT DETECTED Final   Serratia marcescens NOT DETECTED NOT DETECTED Final   Haemophilus influenzae NOT DETECTED NOT DETECTED Final   Neisseria meningitidis NOT DETECTED NOT DETECTED Final   Pseudomonas aeruginosa NOT DETECTED NOT DETECTED Final   Candida albicans NOT DETECTED NOT DETECTED Final   Candida  glabrata NOT DETECTED NOT DETECTED Final   Candida krusei NOT DETECTED NOT DETECTED Final   Candida parapsilosis NOT DETECTED NOT DETECTED Final   Candida tropicalis NOT DETECTED NOT DETECTED Final    Comment: Performed at [facility], [address] [place], [place] [address]  Urine Culture     Status: None   Collection Time: 10/06/19 12:51 AM   Specimen: Urine, Random  Result Value Ref Range Status   Specimen Description   Final    URINE, RANDOM Performed at [facility], [address] [place], [place] [address]    Special Requests   Final    NONE Performed at [facility], [address] [place], [place] [address]     Culture   Final    NO GROWTH Performed at [facility], [place] [address], [place], [place] [address]    Report Status 10/07/2019 FINAL  Final  SARS CORONAVIRUS 2 (TAT 6-24 HRS) Nasopharyngeal Nasopharyngeal Swab     Status: None   Collection Time: 10/06/19  2:30 AM   Specimen: Nasopharyngeal Swab  Result Value Ref Range Status   SARS Coronavirus 2 NEGATIVE NEGATIVE Final    Comment: (NOTE) SARS-CoV-2 target nucleic acids are NOT DETECTED. The SARS-CoV-2 RNA is generally detectable in upper and lower respiratory specimens during the acute phase of infection. Negative results do not preclude SARS-CoV-2 infection, do not rule out co-infections with other pathogens, and should not be used as the sole basis for treatment or other patient management decisions. Negative results must be combined with clinical observations, patient history, and epidemiological information. The expected result is Negative. Fact Sheet for Patients: SugarRoll.be Fact Sheet for Healthcare Providers: https://www.woods-mathews.com/ This test is not yet approved or cleared by the [place] FDA and  has been authorized for detection and/or diagnosis of SARS-CoV-2 by FDA under an Emergency Use Authorization (EUA). This EUA will remain  in effect (meaning this test can be used) for the duration of the COVID-19 declaration under Section 56 4(b)(1) of the Act, 21 U.S.C. section 360bbb-3(b)(1), unless the authorization is terminated or revoked sooner. Performed at [facility], [place] [address], [place], [place] [address]   Respiratory Panel by PCR     Status: None   Collection Time: 10/06/19  3:40 AM   Specimen: Nasopharyngeal Swab; Respiratory  Result Value Ref Range Status   Adenovirus NOT DETECTED NOT DETECTED Final   Coronavirus 229E NOT DETECTED NOT DETECTED Final    Comment: (NOTE) The Coronavirus on the Respiratory Panel, DOES NOT test for the novel   Coronavirus (2019 nCoV)    Coronavirus HKU1 NOT DETECTED NOT DETECTED Final   Coronavirus NL63 NOT DETECTED NOT DETECTED Final   Coronavirus OC43 NOT DETECTED NOT DETECTED Final   Metapneumovirus NOT DETECTED NOT DETECTED Final   Rhinovirus / Enterovirus NOT DETECTED NOT DETECTED Final   Influenza A NOT DETECTED NOT DETECTED Final   Influenza B NOT DETECTED NOT DETECTED Final   Parainfluenza Virus 1 NOT DETECTED NOT DETECTED Final   Parainfluenza Virus 2 NOT DETECTED NOT DETECTED Final   Parainfluenza Virus 3 NOT DETECTED NOT DETECTED Final   Parainfluenza Virus 4 NOT DETECTED NOT DETECTED Final   Respiratory Syncytial Virus NOT DETECTED NOT DETECTED Final   Bordetella pertussis NOT DETECTED NOT DETECTED Final   Chlamydophila pneumoniae NOT DETECTED NOT DETECTED Final   Mycoplasma pneumoniae NOT DETECTED NOT DETECTED Final    Comment: Performed at [facility], [place]  [address], [place], [place] [address]  MRSA PCR Screening     Status: None   Collection Time: 10/06/19  3:41 AM   Specimen: Nasopharyngeal  Result Value Ref Range Status   MRSA by PCR NEGATIVE NEGATIVE Final    Comment:        The GeneXpert MRSA Assay (FDA approved for NASAL specimens only), is one component of a comprehensive MRSA colonization surveillance program. It is not intended to diagnose MRSA infection nor to guide or monitor treatment for MRSA infections. Performed at [facility], [address] [place], [place] [address]   CULTURE, BLOOD (ROUTINE X 2) w Reflex to ID Panel     Status: None   Collection Time: 10/07/19  1:17 AM   Specimen: BLOOD  Result Value Ref Range Status   Specimen Description BLOOD LEFT HAND  Final   Special Requests   Final    BOTTLES DRAWN AEROBIC AND ANAEROBIC Blood Culture adequate volume   Culture   Final    NO GROWTH 5 DAYS Performed at [facility], [address] [place], [place] [address]    Report Status 10/12/2019 FINAL  Final  CULTURE,  BLOOD (ROUTINE X 2) w Reflex to ID Panel     Status: None   Collection Time: 10/07/19  1:17 AM   Specimen: BLOOD  Result Value Ref Range Status   Specimen Description BLOOD LEFT WRIST  Final   Special Requests   Final    BOTTLES DRAWN AEROBIC AND ANAEROBIC Blood Culture adequate volume   Culture   Final    NO GROWTH 5 DAYS Performed at [facility], [address] [place], [place] [address]    Report Status 10/12/2019 FINAL  Final  CSF culture     Status: None   Collection Time: 10/07/19  1:22 PM   Specimen: PATH Cytology CSF; Cerebrospinal Fluid  Result Value Ref Range Status   Specimen Description   Final    CSF Performed at [facility], [address], [place], [place] [address]    Special Requests   Final    NONE Performed at [facility], [address] [place], [place] [address]    Gram Stain   Final    NO ORGANISMS SEEN WBC SEEN RED BLOOD CELLS RESULT CALLED TO, READ BACK BY AND VERIFIED WITH: MEGAN SCHEFFIELD 10/07/19 @ 1526  [place] Performed at [facility], [address], [place], [place] [address]    Culture   Final    NO GROWTH 3 DAYS Performed at [facility], [place] [address] [place], [place] [address]    Report Status 10/11/2019 FINAL  Final  Fungus Culture With Stain     Status: None (Preliminary result)   Collection Time: 10/07/19  1:22 PM   Specimen: PATH Cytology CSF; Cerebrospinal Fluid  Result Value Ref Range Status   Fungus Stain Final report  Final    Comment: (NOTE) Performed At: [facility] [place], [place] [id number] Rush Farmer MD [id number]    Fungus (Mycology) Culture PENDING  Incomplete   Fungal Source CSF  Final    Comment: Performed at [facility], [address] [place],  [address]  Fungus Culture Result     Status: None   Collection Time: 10/07/19  1:22 PM  Result Value Ref Range Status   Result 1 Comment  Final    Comment:  (NOTE) KOH/Calcofluor preparation:  no fungus observed. Performed At: [facility]  [place], [place] [id number] Rush Farmer MD [id number]       CBC    Component Value Date/Time   WBC 12.6 (H) 10/12/2019 0557   RBC 4.43 10/12/2019 0557   HGB 12.2 10/12/2019 0557   HCT 37.9 10/12/2019 0557   PLT 337 10/12/2019 0557   MCV 85.6 10/12/2019 0557   MCH 27.5 10/12/2019 0557   MCHC 32.2 10/12/2019 0557   RDW 15.9 (H) 10/12/2019 0557   LYMPHSABS 3.1 10/12/2019 0557   MONOABS 0.6 10/12/2019 0557   EOSABS 0.1 10/12/2019 0557   BASOSABS 0.0 10/12/2019 0557   BMP Latest Ref Rng & Units 10/12/2019 10/11/2019 10/11/2019  Glucose 70 - 99 mg/dL 101(H) - 145(H)  BUN 6 - 20 mg/dL 26(H) - 34(H)  Creatinine 0.44 - 1.00 mg/dL 0.76 - 0.76  Sodium 135 - 145 mmol/L 153(H) - 156(H)  Potassium 3.5 - 5.1 mmol/L 3.2(L) 3.3(L) 2.9(L)  Chloride 98 - 111 mmol/L 111 - 113(H)  CO2 22 - 32 mmol/L 32 - 33(H)  Calcium 8.9 - 10.3 mg/dL 8.6(L) - 9.4       ASSESSMENT AND PLAN SYNOPSIS  Severe ACUTE Hypoxic and Hypercapnic Respiratory-resolved  Severe sepsis and severe meningitis with STREP PNEUMONIA LEADING to severe toxic metabolic encephalopathy-slowly resolving   INFECTIOUS DISEASE -continue antibiotics as prescribed -follow up cultures -follow up ID consultation   GI GI PROPHYLAXIS as indicated  NUTRITIONAL STATUS DIET-->spech and swallow eval, advance diet as tolerated Constipation protocol as indicated  ENDO - ICU hypoglycemic\Hyperglycemia protocol -check FSBS per protocol  ELECTROLYTES -follow labs as needed -replace as needed -pharmacy consultation and following    DVT/GI PRX ordered TRANSFUSIONS AS NEEDED MONITOR FSBS ASSESS the need for LABS as needed  Transfer to [facility] service 2/1 Step down status for now  Corrin Parker, M.D.  [facility]  Medical Director [place] Director [place] Department

## 2019-10-12 NOTE — Progress Notes (Signed)
Assisted tele visit to patient with family member.  Cathy Zalesky Ann, RN  

## 2019-10-12 NOTE — Progress Notes (Signed)
Brief hospitalist service acceptance note  Signout received from Dr. Aliany Pesa.  Briefly this is a 59 year old female who initially arrived to the emergency department on 10/05/2019 complaining of headache after taking oxycodone.  Initially admitted to the stepdown unit.  On 10/06/2019 patient became minimally responsive after receiving narcotic and benzodiazepine, low dose.  PCCM consulted to assist with management.  Patient required mechanical intubation due to acute hypoxic respiratory failure.  3 out of 4 blood cultures positive for strep pneumonia.  Patient underwent successful lumbar puncture after CT head.  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  Infectious disease following.  Patient had 2 failed spontaneous breathing trials however was successfully extubated on 10/10/2019.  Extubated to BiPAP which gradually was tapered to nasal cannula.  Per chart review patient currently on nasal cannula 5 L.  Somewhat agitated however overall mentating well.  Patient will require consultations from speech pathology, PT, OT.  Patient does not have central line at this time.  Per PCCM attending Foley will be removed today.  Infectious disease continue to follow.  Suspect the patient will need continued extended course of intravenous antibiotics.  Patient will be placed on the [facility] admit list.  [facility] hospitalist to assume primary care of this patient on 10/13/2019.  Case discussed with Dr. Mortimer Fries.  Ralene Muskrat MD

## 2019-10-12 NOTE — Progress Notes (Signed)
Assisted tele visit to patient with family member.  Cathy Palka Ann, RN  

## 2019-10-12 NOTE — Progress Notes (Signed)
Assisted tele visit to patient with daughter.  Detta Mellin Ann, RN  

## 2019-10-12 NOTE — Progress Notes (Signed)
Assisted tele visit to patient with family member.  Aarin Sparkman Ann, RN  

## 2019-10-12 NOTE — Consult Note (Signed)
PHARMACY CONSULT NOTE - FOLLOW UP  Pharmacy Consult for Electrolyte Monitoring and Replacement   Recent Labs: Potassium (mmol/L)  Date Value  10/12/2019 3.2 (L)   Magnesium (mg/dL)  Date Value  10/12/2019 2.6 (H)   Calcium (mg/dL)  Date Value  10/12/2019 8.6 (L)   Albumin (g/dL)  Date Value  10/08/2019 2.7 (L)   Phosphorus (mg/dL)  Date Value  10/12/2019 3.4   Sodium (mmol/L)  Date Value  10/12/2019 153 (H)     Assessment: Pharmacy has been consulted to manage electrolytes for this 59 year-old female admitted  with pneumococcal bacteremia and purulent meningitis.  Patient has a PMH significant for prediabetes, hypertension, and COPD.  Renal function is improving and approaching baseline (Baseline Scr appears to be 0.77 to 0.91).  Patient was extubated 1/29.    Electrolytes K 3.2  Mag 2.6  Phos 3.4  Scr 0.76 Goal:  All electrolytes WNL -will order KCL 10 meq IV x 4  Electrolytes with AM labs.  Glucose:  BG 84-156 over last 24 hrs.  A1c 8.2.  NPO. On D5W IVF at 50 ml/hr on Novolog 0-15 units q4 hours and Levemir 8 units BID  Bowel Regimen LBM 1/31.  Patient is post-extubation, and all PO constipation meds d/c'd.  Will continue to monitor.   Tarry Fountain A ,PharmD 10/12/2019 11:39 AM

## 2019-10-13 ENCOUNTER — Encounter: Payer: Self-pay | Admitting: Internal Medicine

## 2019-10-13 ENCOUNTER — Inpatient Hospital Stay: Payer: Self-pay

## 2019-10-13 DIAGNOSIS — B955 Unspecified streptococcus as the cause of diseases classified elsewhere: Secondary | ICD-10-CM

## 2019-10-13 DIAGNOSIS — G001 Pneumococcal meningitis: Secondary | ICD-10-CM | POA: Diagnosis present

## 2019-10-13 DIAGNOSIS — E870 Hyperosmolality and hypernatremia: Secondary | ICD-10-CM

## 2019-10-13 DIAGNOSIS — R7881 Bacteremia: Secondary | ICD-10-CM | POA: Diagnosis present

## 2019-10-13 LAB — PHOSPHORUS: Phosphorus: 2.2 mg/dL — ABNORMAL LOW (ref 2.5–4.6)

## 2019-10-13 LAB — BASIC METABOLIC PANEL
Anion gap: 9 (ref 5–15)
BUN: 18 mg/dL (ref 6–20)
CO2: 30 mmol/L (ref 22–32)
Calcium: 8.8 mg/dL — ABNORMAL LOW (ref 8.9–10.3)
Chloride: 111 mmol/L (ref 98–111)
Creatinine, Ser: 0.75 mg/dL (ref 0.44–1.00)
GFR calc Af Amer: >60 mL/min (ref >60)
GFR calc non Af Amer: >60 mL/min (ref >60)
Glucose, Bld: 129 mg/dL — ABNORMAL HIGH (ref 70–99)
Potassium: 3.6 mmol/L (ref 3.5–5.1)
Sodium: 150 mmol/L — ABNORMAL HIGH (ref 135–145)

## 2019-10-13 LAB — CBC WITH DIFFERENTIAL/PLATELET
Abs Immature Granulocytes: 0.09 10*3/uL — ABNORMAL HIGH (ref 0.00–0.07)
Basophils Absolute: 0 10*3/uL (ref 0.0–0.1)
Basophils Relative: 0 %
Eosinophils Absolute: 0.1 10*3/uL (ref 0.0–0.5)
Eosinophils Relative: 1 %
HCT: 37.3 % (ref 36.0–46.0)
Hemoglobin: 12 g/dL (ref 12.0–15.0)
Immature Granulocytes: 1 %
Lymphocytes Relative: 22 %
Lymphs Abs: 2.6 10*3/uL (ref 0.7–4.0)
MCH: 27.6 pg (ref 26.0–34.0)
MCHC: 32.2 g/dL (ref 30.0–36.0)
MCV: 85.9 fL (ref 80.0–100.0)
Monocytes Absolute: 0.6 10*3/uL (ref 0.1–1.0)
Monocytes Relative: 5 %
Neutro Abs: 8.7 10*3/uL — ABNORMAL HIGH (ref 1.7–7.7)
Neutrophils Relative %: 71 %
Platelets: 245 10*3/uL (ref 150–400)
RBC: 4.34 MIL/uL (ref 3.87–5.11)
RDW: 16 % — ABNORMAL HIGH (ref 11.5–15.5)
WBC: 12 10*3/uL — ABNORMAL HIGH (ref 4.0–10.5)
nRBC: 0 % (ref 0.0–0.2)

## 2019-10-13 LAB — GLUCOSE, CAPILLARY
Glucose-Capillary: 111 mg/dL — ABNORMAL HIGH (ref 70–99)
Glucose-Capillary: 121 mg/dL — ABNORMAL HIGH (ref 70–99)
Glucose-Capillary: 142 mg/dL — ABNORMAL HIGH (ref 70–99)
Glucose-Capillary: 99 mg/dL (ref 70–99)

## 2019-10-13 LAB — MAGNESIUM: Magnesium: 2.7 mg/dL — ABNORMAL HIGH (ref 1.7–2.4)

## 2019-10-13 IMAGING — CT CT ANGIO HEAD
3 of 10 series · 16 of 47 positions shown · IV contrast (omnipaque)
Comparison: Head CTA [DATE] and MRI [DATE]

CLINICAL DATA: New onset right-sided weakness.  Meningitis.

EXAM:
CT ANGIOGRAPHY HEAD
CT VENOGRAM HEAD
TECHNIQUE: Multidetector CT imaging of the head was performed using the
standard protocol during bolus administration of intravenous
contrast. Multiplanar CT image reconstructions and MIPs were
obtained to evaluate the vascular anatomy.
CONTRAST:  75mL OMNIPAQUE IOHEXOL 350 MG/ML SOLN

[Series 8: ax thin · axial · 0.30mm/px · z∈[-157,-26]mm · 10 of 159 slices shown]
[im 11/159  brain]
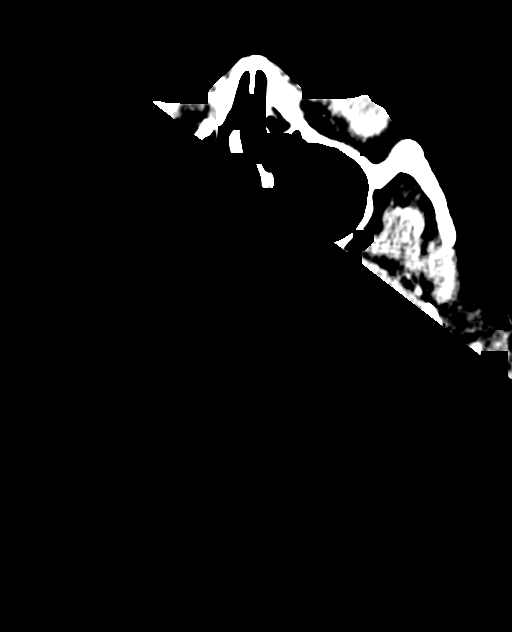
[im 32/159  bone]
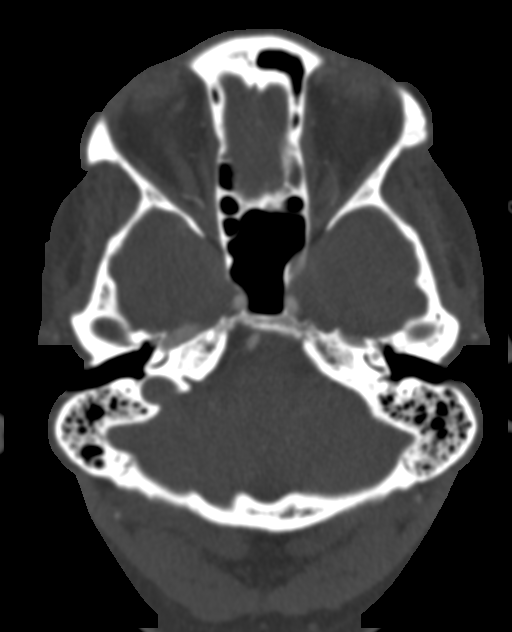
[im 43/159  brain]
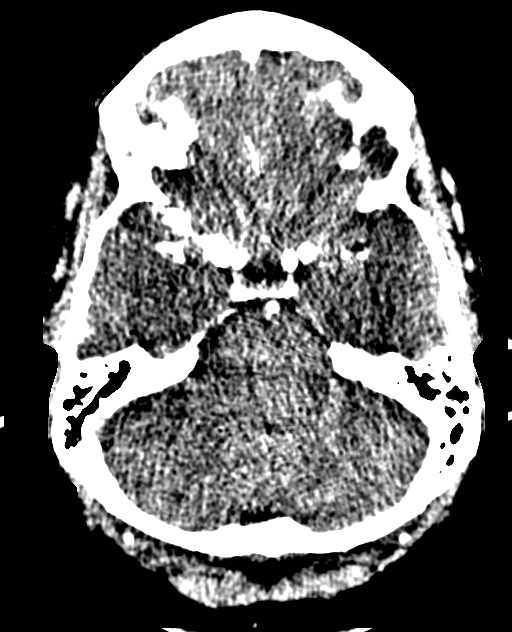
[im 53/159  bone]
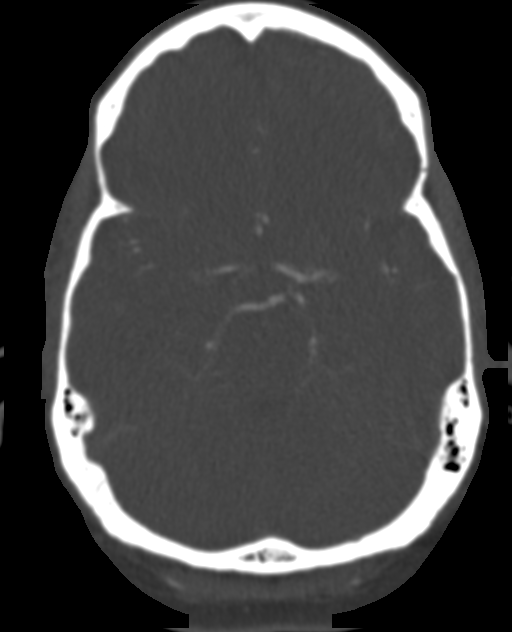
[im 74/159  brain]
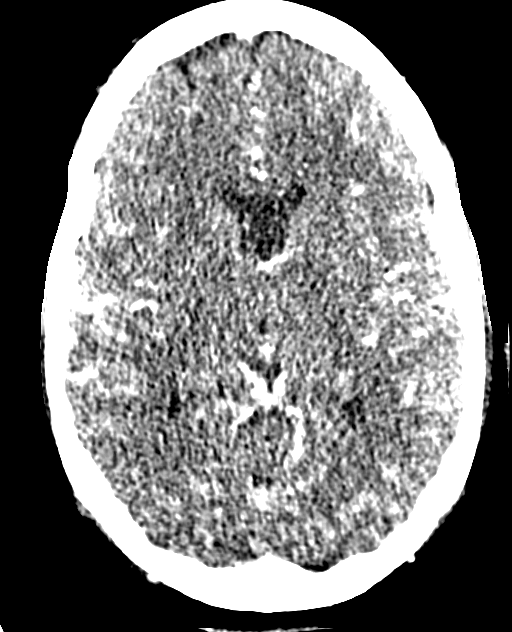
[im 85/159  bone]
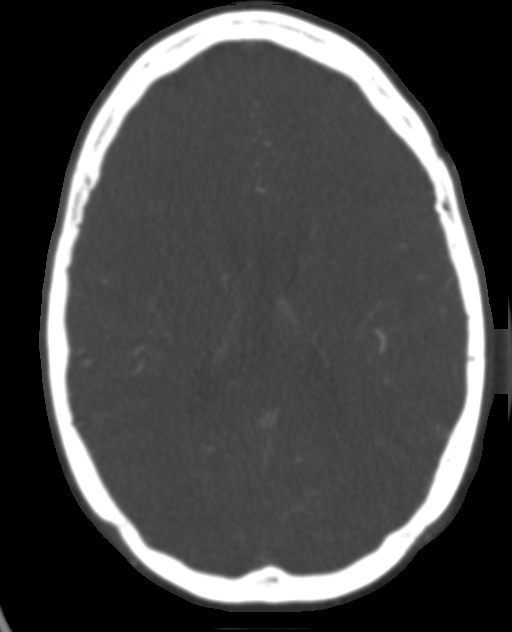
[im 106/159  brain]
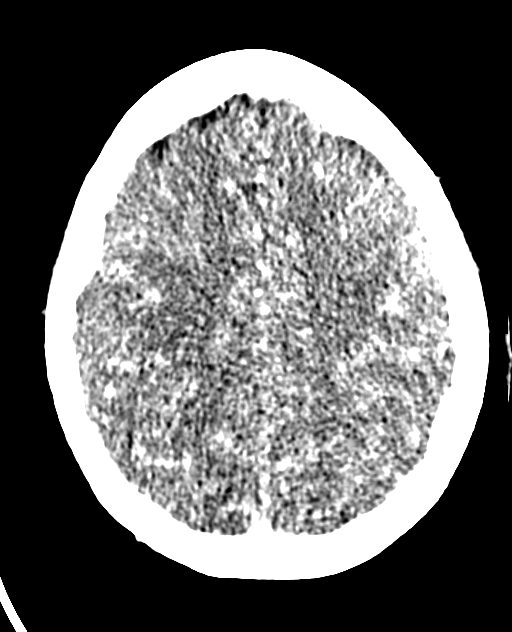
[im 116/159  bone]
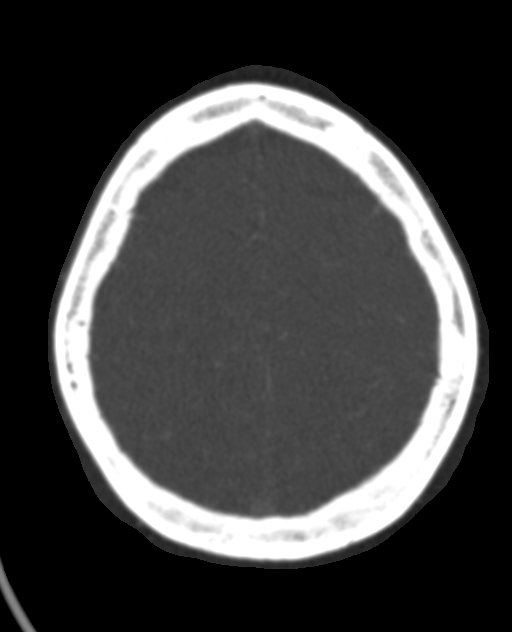
[im 127/159  brain]
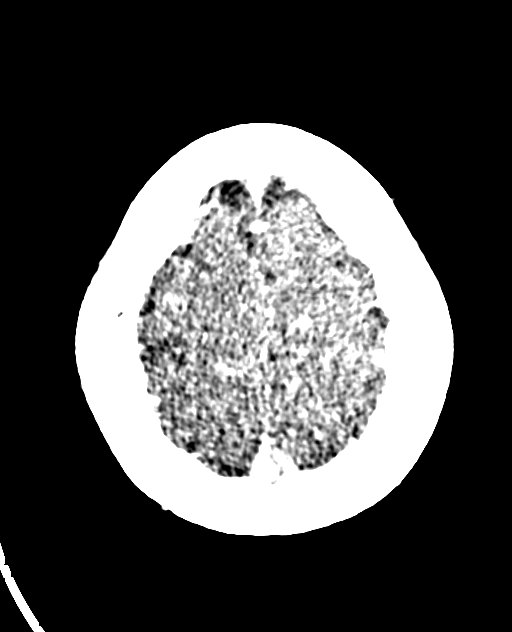
[im 148/159  bone]
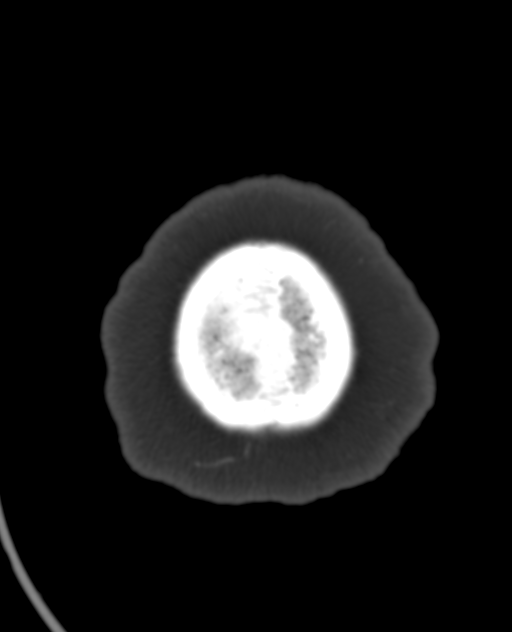

[Series 10: cor thin · coronal · 0.30mm/px · 3 of 191 slices shown]
[im 39/191  brain]
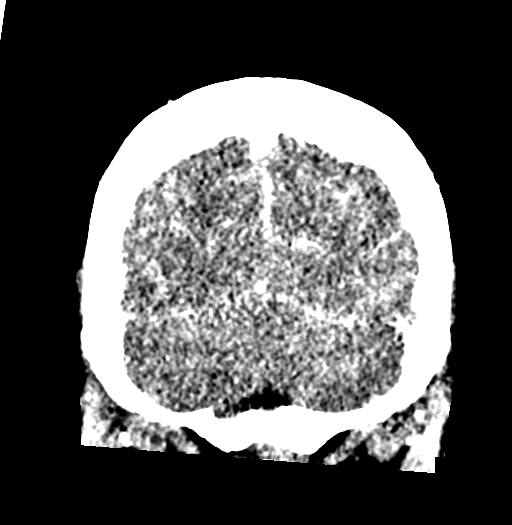
[im 77/191  brain]
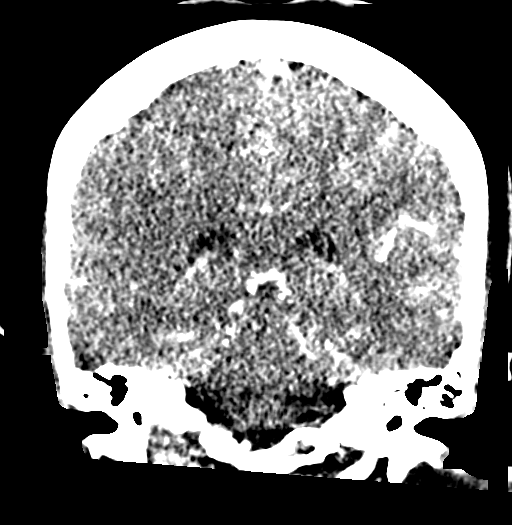
[im 115/191  brain]
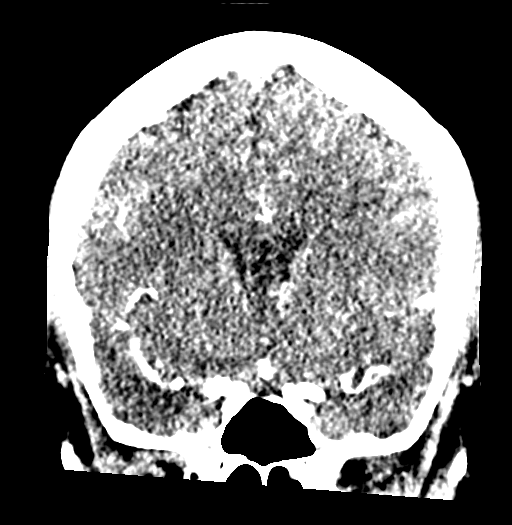

[Series 12: sag thin · sagittal · 0.31mm/px · 3 of 149 slices shown]
[im 38/149  brain]
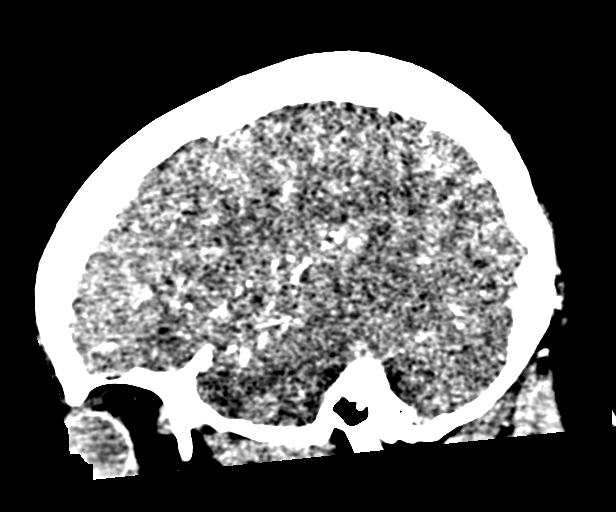
[im 75/149  brain]
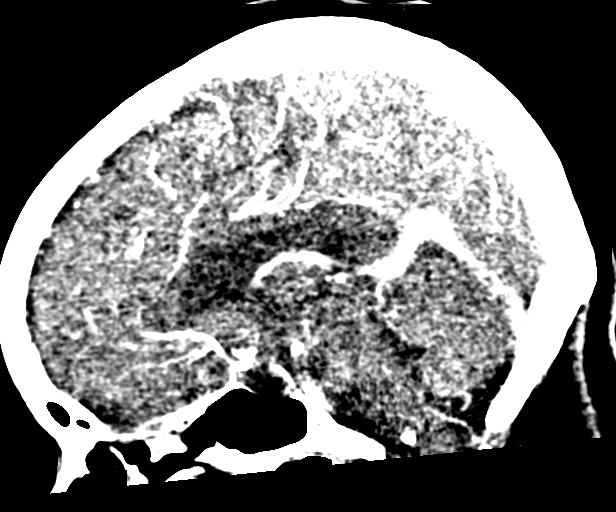
[im 112/149  brain]
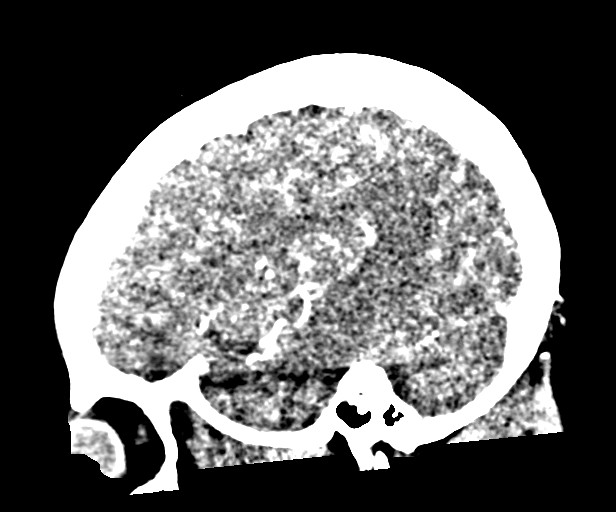

[16 of 47 positions shown; findings below may reference images not displayed]

FINDINGS: CT HEAD

Brain: No acute large territory infarct, intracranial hemorrhage,
mass, midline shift, or extra-axial fluid collection is identified.
The ventricles and sulci are normal in size. A cavum septum
pellucidum et vergae is again noted.

Vascular: Reported below.

Skull: No fracture suspicious osseous lesion.

Sinuses: Mild posterior left ethmoid air cell mucosal thickening.
Small mastoid effusions.

Orbits: Unremarkable.

CTA HEAD

Anterior circulation: The internal carotid arteries are widely
patent from skull base to carotid termini. ACAs and MCAs are patent
without evidence of proximal branch occlusion or significant
proximal stenosis. No aneurysm is identified.

Posterior circulation: The visualized distal vertebral arteries are
patent to the basilar with the left being dominant. Patent SCA is
are seen bilaterally. The basilar artery is widely patent. There are
small posterior communicating arteries bilaterally. Both PCAs are
patent without evidence of significant proximal stenosis. No
aneurysm is identified.

Anatomic variants: None.

CTV HEAD

The superior sagittal sinus, internal cerebral veins, vein of ALBERGA,
straight sinus, right transverse sinus, and right sigmoid sinus are
patent. The left transverse and left sigmoid sinuses are hypoplastic
resulting in suboptimal visualization.
IMPRESSION: 1. No evidence of acute intracranial abnormality.
2. No major intracranial arterial occlusion or significant stenosis.
3. No evidence of dural venous sinus thrombosis.

## 2019-10-13 IMAGING — CT CT VENOGRAM HEAD
1 of 6 series · 18 of 47 positions shown · IV contrast (APPLIED)
Comparison: Head CTA [DATE] and MRI [DATE]

CLINICAL DATA: New onset right-sided weakness.  Meningitis.

EXAM:
CT ANGIOGRAPHY HEAD
CT VENOGRAM HEAD
TECHNIQUE: Multidetector CT imaging of the head was performed using the
standard protocol during bolus administration of intravenous
contrast. Multiplanar CT image reconstructions and MIPs were
obtained to evaluate the vascular anatomy.
CONTRAST:  75mL OMNIPAQUE IOHEXOL 350 MG/ML SOLN

[Series 5: head w · axial · 0.47mm/px · z∈[-151,-17]mm · 18 of 75 slices shown]
[im 4/75  brain]
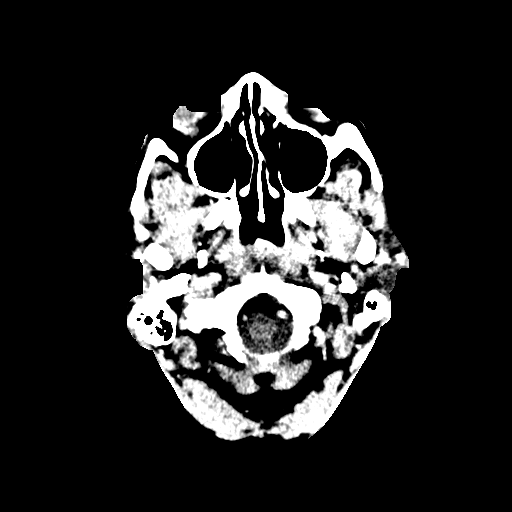
[im 8/75  bone]
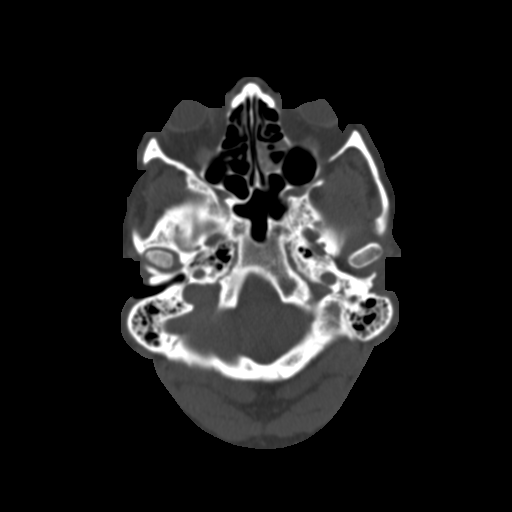
[im 12/75  brain]
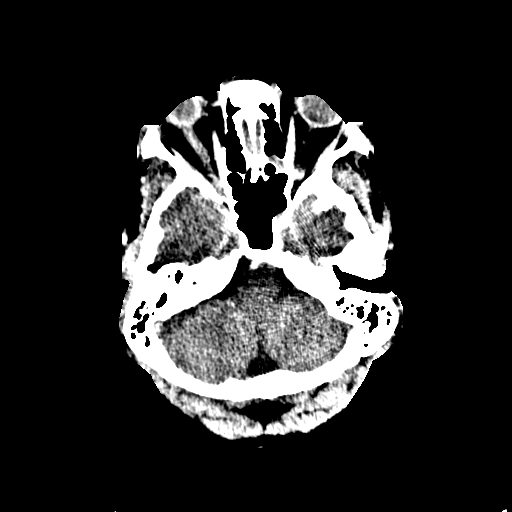
[im 15/75  bone]
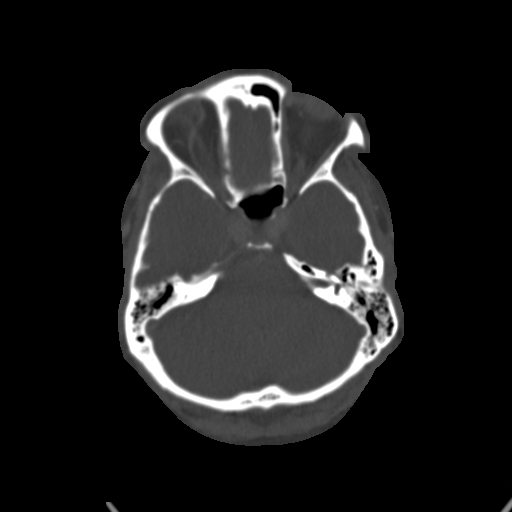
[im 19/75  brain]
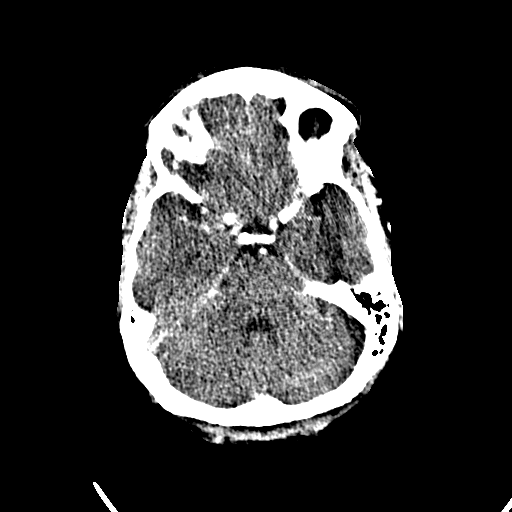
[im 23/75  bone]
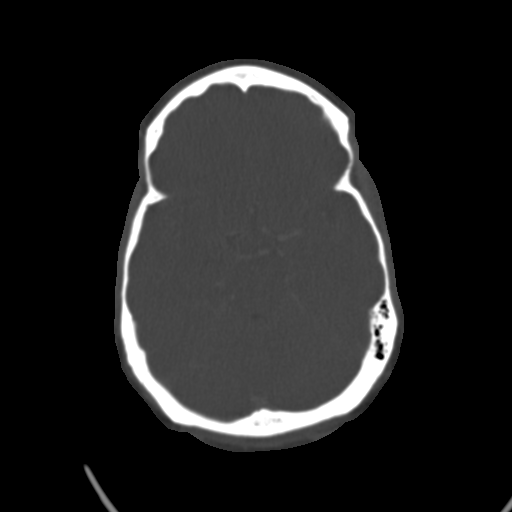
[im 26/75  brain]
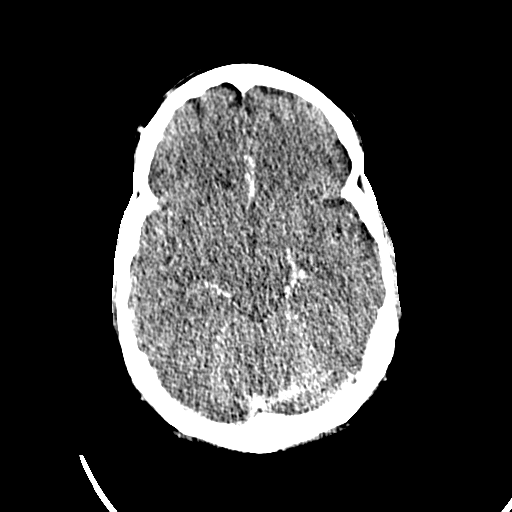
[im 30/75  bone]
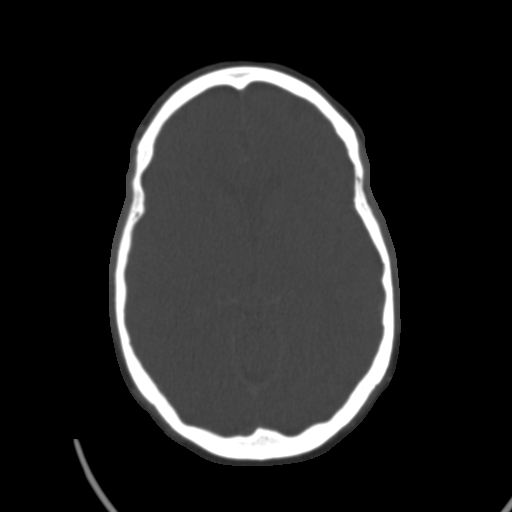
[im 34/75  brain]
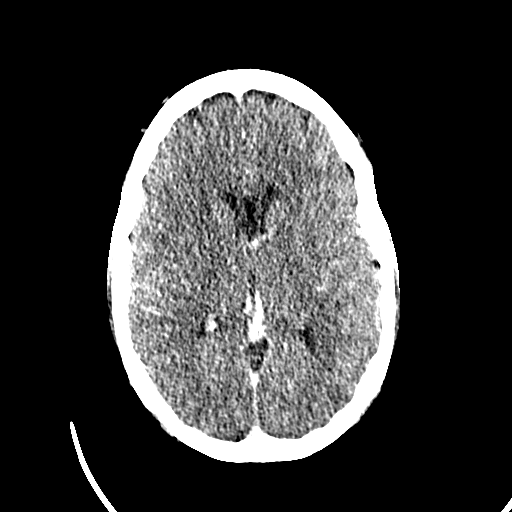
[im 41/75  bone]
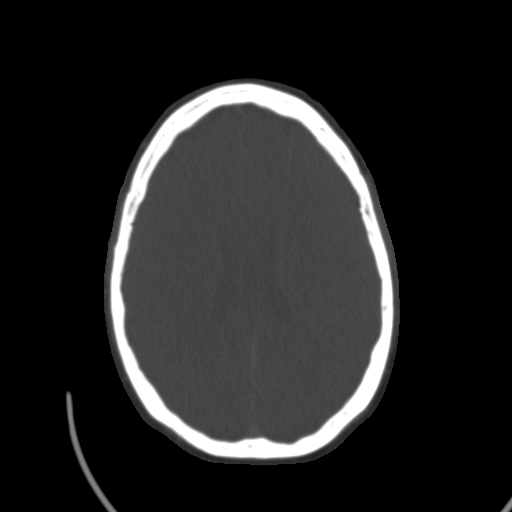
[im 45/75  brain]
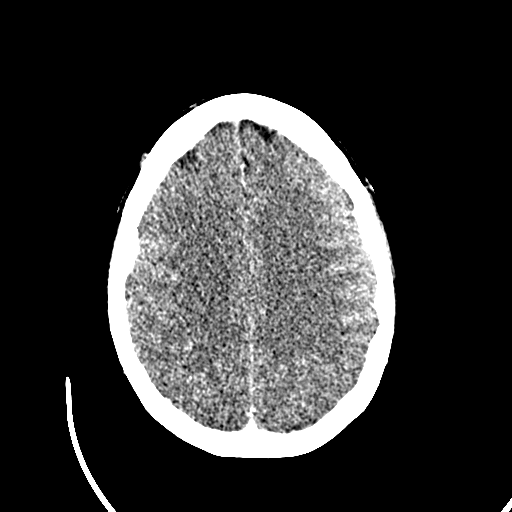
[im 49/75  bone]
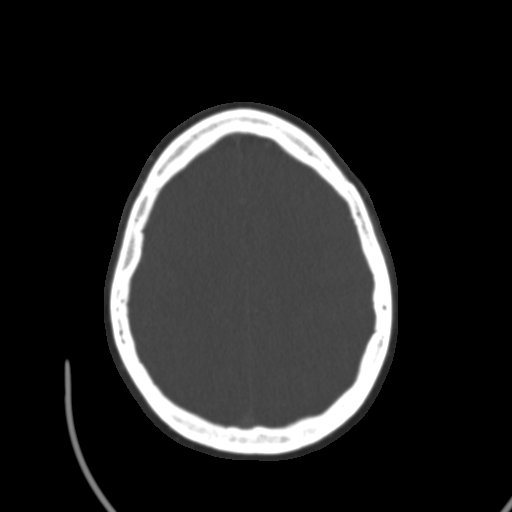
[im 52/75  brain]
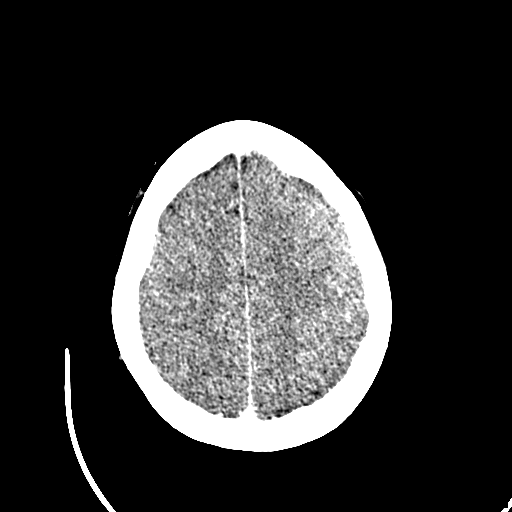
[im 56/75  bone]
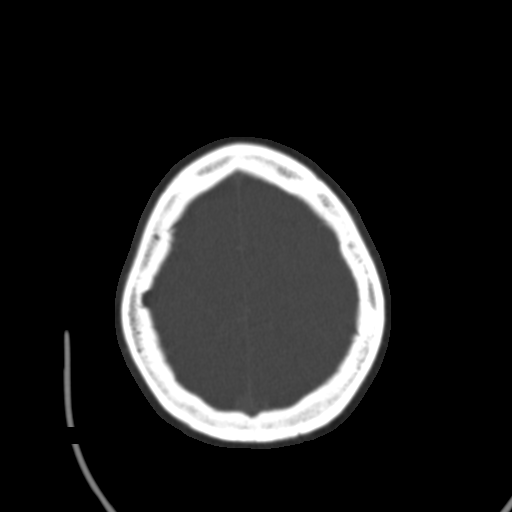
[im 60/75  brain]
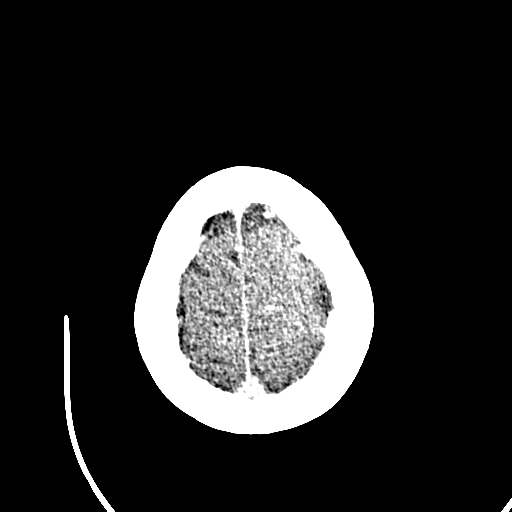
[im 63/75  bone]
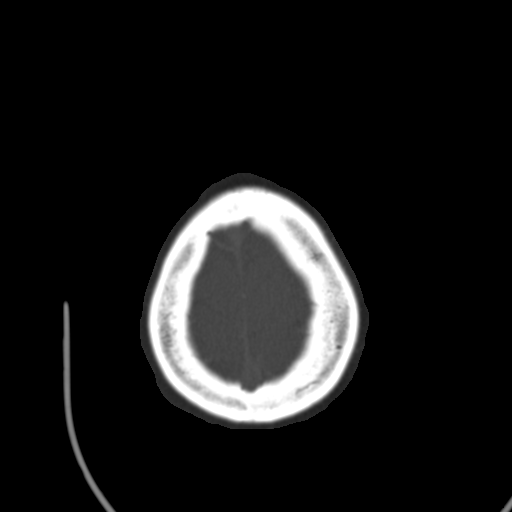
[im 67/75  brain]
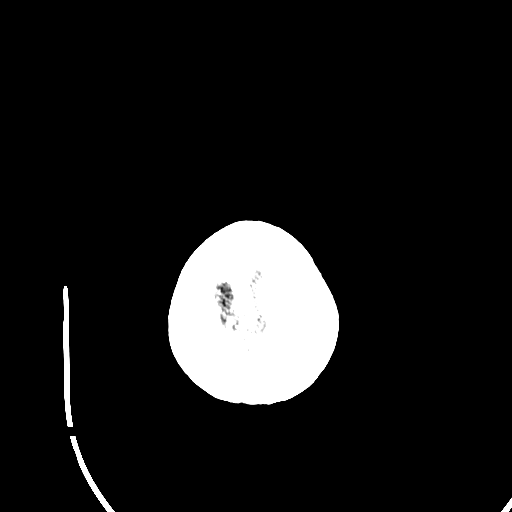
[im 71/75  bone]
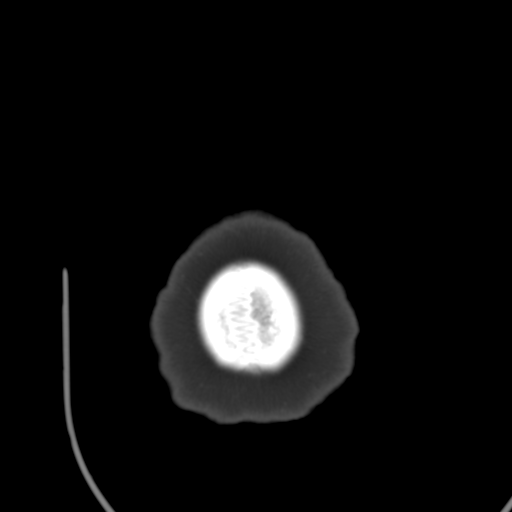

[18 of 47 positions shown; findings below may reference images not displayed]

FINDINGS: CT HEAD

Brain: No acute large territory infarct, intracranial hemorrhage,
mass, midline shift, or extra-axial fluid collection is identified.
The ventricles and sulci are normal in size. A cavum septum
pellucidum et vergae is again noted.

Vascular: Reported below.

Skull: No fracture suspicious osseous lesion.

Sinuses: Mild posterior left ethmoid air cell mucosal thickening.
Small mastoid effusions.

Orbits: Unremarkable.

CTA HEAD

Anterior circulation: The internal carotid arteries are widely
patent from skull base to carotid termini. ACAs and MCAs are patent
without evidence of proximal branch occlusion or significant
proximal stenosis. No aneurysm is identified.

Posterior circulation: The visualized distal vertebral arteries are
patent to the basilar with the left being dominant. Patent SCA is
are seen bilaterally. The basilar artery is widely patent. There are
small posterior communicating arteries bilaterally. Both PCAs are
patent without evidence of significant proximal stenosis. No
aneurysm is identified.

Anatomic variants: None.

CTV HEAD

The superior sagittal sinus, internal cerebral veins, vein of ALBERGA,
straight sinus, right transverse sinus, and right sigmoid sinus are
patent. The left transverse and left sigmoid sinuses are hypoplastic
resulting in suboptimal visualization.
IMPRESSION: 1. No evidence of acute intracranial abnormality.
2. No major intracranial arterial occlusion or significant stenosis.
3. No evidence of dural venous sinus thrombosis.

## 2019-10-13 MED ORDER — POTASSIUM PHOSPHATES 15 MMOLE/5ML IV SOLN
10.0000 mmol | Freq: Once | INTRAVENOUS | Status: AC
  Administered 2019-10-13: 08:00:00 10 mmol via INTRAVENOUS
  Filled 2019-10-13: qty 3.33

## 2019-10-13 MED ORDER — AMLODIPINE BESYLATE 5 MG PO TABS
5.0000 mg | ORAL_TABLET | Freq: Every day | ORAL | Status: DC
  Administered 2019-10-14: 10:00:00 5 mg via ORAL
  Filled 2019-10-13: qty 1

## 2019-10-13 MED ORDER — IPRATROPIUM-ALBUTEROL 0.5-2.5 (3) MG/3ML IN SOLN
3.0000 mL | Freq: Four times a day (QID) | RESPIRATORY_TRACT | Status: DC
  Administered 2019-10-13 – 2019-10-15 (×7): 3 mL via RESPIRATORY_TRACT
  Filled 2019-10-13 (×8): qty 3

## 2019-10-13 MED ORDER — PANTOPRAZOLE SODIUM 40 MG PO TBEC
40.0000 mg | DELAYED_RELEASE_TABLET | Freq: Every day | ORAL | Status: DC
  Administered 2019-10-14 – 2019-10-15 (×2): 40 mg via ORAL
  Filled 2019-10-13 (×3): qty 1

## 2019-10-13 MED ORDER — IOHEXOL 350 MG/ML SOLN
75.0000 mL | Freq: Once | INTRAVENOUS | Status: AC | PRN
  Administered 2019-10-13: 18:00:00 75 mL via INTRAVENOUS

## 2019-10-13 MED ORDER — INSULIN ASPART 100 UNIT/ML ~~LOC~~ SOLN
0.0000 [IU] | Freq: Three times a day (TID) | SUBCUTANEOUS | Status: DC
  Administered 2019-10-14: 17:00:00 3 [IU] via SUBCUTANEOUS
  Administered 2019-10-14 – 2019-10-15 (×2): 2 [IU] via SUBCUTANEOUS
  Filled 2019-10-13 (×3): qty 1

## 2019-10-13 MED ORDER — ENSURE MAX PROTEIN PO LIQD
11.0000 [oz_av] | Freq: Two times a day (BID) | ORAL | Status: DC
  Administered 2019-10-15: 15:00:00 11 [oz_av] via ORAL
  Filled 2019-10-13: qty 330

## 2019-10-13 MED ORDER — FLUTICASONE PROPIONATE 50 MCG/ACT NA SUSP
2.0000 | Freq: Every day | NASAL | Status: DC
  Administered 2019-10-14: 10:00:00 2 via NASAL
  Filled 2019-10-13: qty 16

## 2019-10-13 MED ORDER — NYSTATIN 100000 UNIT/ML MT SUSP
5.0000 mL | Freq: Four times a day (QID) | OROMUCOSAL | Status: DC
  Administered 2019-10-13 – 2019-10-15 (×11): 500000 [IU] via ORAL
  Filled 2019-10-13 (×15): qty 5

## 2019-10-13 MED ORDER — SALINE SPRAY 0.65 % NA SOLN
1.0000 | NASAL | Status: DC | PRN
  Filled 2019-10-13 (×2): qty 44

## 2019-10-13 MED ORDER — MONTELUKAST SODIUM 10 MG PO TABS
10.0000 mg | ORAL_TABLET | Freq: Every day | ORAL | Status: DC
  Administered 2019-10-14 – 2019-10-15 (×2): 10 mg via ORAL
  Filled 2019-10-13 (×3): qty 1

## 2019-10-13 NOTE — Progress Notes (Signed)
Assisted tele visit to patient with family member.  Dellia Donnelly McEachran, RN  

## 2019-10-13 NOTE — Progress Notes (Addendum)
PROGRESS NOTE    Cathy Hughes  YBF:[id number] DOB: November 07, 1960 DOA: 10/05/2019  PCP: Donnie Coffin, MD    LOS - 7   Brief Narrative:  59 yo female admitted after presented with persistent left ear pain, developed AMS while in the ED, suspicious for meningitis which was later confirmed on LP.  Patient with pneumococcal meningitis and bacteremia.  Subsequently progressed to acute respiratory failure secondary to metabolic acidosis, acute toxic metabolic encephalopathy, and sepsis etiology secondary to meningitis requiring mechanical intubation. Extubated 1/29.  Stable and transferred to [facility] service as of 2/1.  Downgraded to med/surg.  Subjective 2/1: Patient seen this AM at bedside in ICU.  She reports she is very hungry, asking she can eat.  Also reports significant nasal congestion and asking for nasal spray.  Denies any fevers or chills, headaches, vision changes, nausea vomiting diarrhea, chest pain or shortness of breath.  Patient did not report any focal weakness during my encounter, however during therapy evaluations it was noted patient has right-sided weakness that she reports is new.  Unclear time of onset of this.  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  Unknown time of onset, would not be candidate for TPA. Unclear if this is due to meningitis itself versus acute thrombosis or CVA/TIA.  Discussed with neuro, will get CTA head and CT venogram to further  evaluate.  Acute metabolic encephalopathy -secondary to meningitis - appears resolved  Hypernatremia - D5W at 50 cc/hr running. Monitor BMP.  Essential hypertension -currently uncontrolled as meds have been held. --Resume home amlodipine 5 mg daily --Hydralazine as needed  COPD -not acutely exacerbated.  Home regimen is albuterol inhaler prn and duo nebs prn --Continue Pulmicort nebs twice daily --Continue duo nebs every 6 hours  Type 2 diabetes with hyperglycemia -appears not to be on medications for this outpatient. Hemoglobin A1c 8.2% on 10/06/2019.  Newly diagnosed.  Blood glucose well controlled on current regimen. --Levemir 8 units twice daily --Moderate sliding scale NovoLog --Change from every 4 hour CBGs and sliding scale to meals and bedtime  Left ear pain -secondary to, this resolved.  Monitor.  Perennial allergic rhinitis --Resume home Singulair and Flonase --[place] nasal spray for congestion  Tobacco abuse --counsel on cessation --will provide nicotine patch if needed  GERD -continue home PPI  Oral thrush -nystatin solution ordered   DVT prophylaxis: Lovenox   Code Status: Full Code  Family Communication: None at bedside Disposition Plan: Pending therapy assessments, expect patient will need rehab placement.  Discharge pending further evaluation including TEE.   Consultants:   Neurology  Infectious disease  PCCM  Cardiology  Diabetes coordinator  Procedures:   Intubation  Extubation  Antimicrobials:   Rocephin   Objective: Vitals:   10/13/19 0905 10/13/19 1000 10/13/19 1226 10/13/19 1404  BP: (!) 151/76 (!) 149/72 (!) 178/82   Pulse: 87 92 82   Resp: 15 14    Temp:   98 F (36.7 C)   TempSrc:  Oral   SpO2: 94% 95% 97% 96%  Weight:      Height:        Intake/Output Summary (Last 24 hours) at 10/13/2019 1720 Last data filed at 10/13/2019 1520 Gross per 24 hour  Intake 3740.42 ml  Output 2290 ml  Net 1450.42 ml   Filed Weights    10/09/19 0359 10/10/19 0500 10/13/19 0445  Weight: 105.6 kg 106 kg 103 kg    Examination:  General exam: awake, alert, no acute distress, obese HEENT: Very dry appearing lips, clear conjunctiva, dry mucus membranes, hearing grossly normal  Respiratory system: clear to auscultation bilaterally, no wheezes, rales or rhonchi, normal respiratory effort. Cardiovascular system: normal S1/S2, RRR, no JVD, murmurs, rubs, gallops, no pedal edema.   Gastrointestinal system: soft, non-tender, non-distended abdomen, normal bowel sounds. Central nervous system: alert and oriented x4. no gross focal neurologic deficits, normal speech Extremities: moves all, no deformities, normal tone Skin: dry, intact, normal temperature Psychiatry: normal mood, congruent affect    Data Reviewed: I have personally reviewed following labs and imaging studies  CBC: Recent Labs  Lab 10/07/19 0117 10/07/19 0117 10/08/19 0553 10/09/19 0509 10/11/19 0607 10/12/19 0557 10/13/19 0313  WBC 25.1*   < > 19.6* 17.2* 13.1* 12.6* 12.0*  NEUTROABS 22.8*  --  17.6*  --   --  8.7* 8.7*  HGB 11.9*   < > 10.9* 10.5* 11.9* 12.2 12.0  HCT 37.1   < > 34.3* 33.7* 36.3 37.9 37.3  MCV 86.9   < > 87.7 88.9 85.4 85.6 85.9  PLT 356   < > 343 323 362 337 245   < > = values in this interval not displayed.   Basic Metabolic Panel: Recent Labs  Lab 10/08/19 0553 10/08/19 0553 10/09/19 0509 10/10/19 0606 10/11/19 0607 10/11/19 1953 10/12/19 0557 10/13/19 0313  NA 148*   < > 155* 152* 156*  --  153* 150*  K 3.9   < > 4.5 4.2 2.9* 3.3* 3.2* 3.6  CL 114*   < > 118* 114* 113*  --  111 111  CO2 27   < > 29 30 33*  --  32 30  GLUCOSE 222*   < > 168* 198* 145*  --  101* 129*  BUN 25*   < > 33* 39* 34*  --  26* 18  CREATININE 0.90   < > 0.84 0.75 0.76  --  0.76 0.75  CALCIUM 9.1   < > 9.5 9.3 9.4  --  8.6* 8.8*  MG 3.0*  --   --  3.0* 2.3  --  2.6* 2.7*  PHOS 4.0   < >  --  4.4 2.2* 3.9 3.4 2.2*   < > = values in this  interval not displayed.   GFR: Estimated Creatinine Clearance: 93.5 mL/min (by C-G formula based on SCr of 0.75 mg/dL). Liver Function Tests: Recent Labs  Lab 10/08/19 0553  AST 21  ALT 23  ALKPHOS 55  BILITOT 0.5  PROT 7.0  ALBUMIN 2.7*   No results for input(s): LIPASE, AMYLASE in the last 168 hours. No results for input(s): AMMONIA in the last 168 hours. Coagulation Profile: Recent Labs  Lab 10/07/19 0821  INR 1.1   Cardiac Enzymes: No results for input(s): CKTOTAL, CKMB, CKMBINDEX, TROPONINI in the last 168 hours. BNP (last 3 results) No results for input(s): PROBNP in the last 8760 hours. HbA1C: No results for input(s): HGBA1C in the last 72 hours. CBG: Recent Labs  Lab 10/12/19 1931 10/13/19 0018 10/13/19 0425 10/13/19 0741 10/13/19 1119  GLUCAP 123* 121* 99 111* 142*   Lipid Profile: No results for input(s): CHOL, HDL, LDLCALC, TRIG, CHOLHDL, LDLDIRECT in the last 72 hours. Thyroid Function Tests: No results for input(s): TSH, T4TOTAL, FREET4, T3FREE, THYROIDAB in the last 72 hours. Anemia Panel: No results for input(s): VITAMINB12, FOLATE, FERRITIN, TIBC, IRON, RETICCTPCT in the last 72 hours. Sepsis Labs: Recent Labs  Lab 10/07/19 0117  PROCALCITON 5.66    Recent Results (from the past 240 hour(s))  Culture, blood (routine x 2)     Status: Abnormal   Collection Time: 10/06/19 12:13 AM   Specimen: BLOOD  Result Value Ref Range Status   Specimen Description   Final    BLOOD LEFT HAND Performed at [facility], [address], [place], [place] [address]    Special Requests   Final    BOTTLES DRAWN AEROBIC AND ANAEROBIC Blood Culture results may not be optimal due to an inadequate volume of blood received in culture bottles Performed at [facility], [address] [place], [place] [address]    Culture  Setup Time   Final    GRAM POSITIVE COCCI IN BOTH AEROBIC AND ANAEROBIC BOTTLES CRITICAL RESULT CALLED TO, READ BACK BY  AND VERIFIED WITH: Rito Ehrlich ON 10/06/19 AT 1225 QSD Performed at [facility], [place] [address], [place], [place] [address]    Culture STREPTOCOCCUS PNEUMONIAE (A)  Final   Report Status 10/08/2019 FINAL  Final   Organism ID, Bacteria STREPTOCOCCUS PNEUMONIAE  Final      Susceptibility   Streptococcus pneumoniae - MIC*    ERYTHROMYCIN >=8 RESISTANT Resistant     LEVOFLOXACIN 0.5 SENSITIVE Sensitive     VANCOMYCIN 0.25 SENSITIVE Sensitive     PENICILLIN (non-meningitis) 0.25 SENSITIVE Sensitive     PENICILLIN (oral) 0.25 INTERMEDIATE Intermediate     CEFTRIAXONE (non-meningitis) 0.25 SENSITIVE Sensitive     * STREPTOCOCCUS PNEUMONIAE  Culture, blood (routine x 2)     Status: Abnormal   Collection Time: 10/06/19 12:13 AM   Specimen: BLOOD  Result Value Ref Range Status   Specimen Description   Final    BLOOD RIGHT HAND Performed at [facility], [address], [place], [place] [address]    Special Requests   Final    BOTTLES DRAWN AEROBIC AND ANAEROBIC Blood Culture results may not be optimal due to an inadequate volume of blood received in culture bottles Performed at [facility], [address] [place], [place] [address]    Culture  Setup Time   Final    GRAM POSITIVE COCCI IN BOTH AEROBIC AND ANAEROBIC BOTTLES CRITICAL RESULT CALLED TO, READ BACK BY AND VERIFIED WITH: LISA KLUTTZ ON 10/06/19 AT 1032 QSD Performed at [facility], [address] [place], [place] [address]    Culture (A)  Final    STREPTOCOCCUS PNEUMONIAE SUSCEPTIBILITIES PERFORMED ON PREVIOUS CULTURE WITHIN THE LAST 5 DAYS. Performed at [facility], [place] [address] [place],  [address]    Report Status 10/08/2019 FINAL  Final  Blood Culture ID Panel (Reflexed)     Status: Abnormal   Collection Time: 10/06/19 12:13 AM  Result Value Ref Range Status   Enterococcus species NOT DETECTED NOT DETECTED Final   Listeria monocytogenes NOT DETECTED NOT DETECTED  Final   Staphylococcus species NOT DETECTED NOT DETECTED Final   Staphylococcus aureus (BCID) NOT DETECTED NOT DETECTED Final  Streptococcus species DETECTED (A) NOT DETECTED Final    Comment: CRITICAL RESULT CALLED TO, READ BACK BY AND VERIFIED WITH: WALID NAZARI ON 10/06/19 AT 1225 QSD    Streptococcus agalactiae NOT DETECTED NOT DETECTED Final   Streptococcus pneumoniae DETECTED (A) NOT DETECTED Final    Comment: CRITICAL RESULT CALLED TO, READ BACK BY AND VERIFIED WITH: WALID NAZARI ON 10/06/19 AT 1225 QSD    Streptococcus pyogenes NOT DETECTED NOT DETECTED Final   Acinetobacter baumannii NOT DETECTED NOT DETECTED Final   Enterobacteriaceae species NOT DETECTED NOT DETECTED Final   Enterobacter cloacae complex NOT DETECTED NOT DETECTED Final   Escherichia coli NOT DETECTED NOT DETECTED Final   Klebsiella oxytoca NOT DETECTED NOT DETECTED Final   Klebsiella pneumoniae NOT DETECTED NOT DETECTED Final   Proteus species NOT DETECTED NOT DETECTED Final   Serratia marcescens NOT DETECTED NOT DETECTED Final   Haemophilus influenzae NOT DETECTED NOT DETECTED Final   Neisseria meningitidis NOT DETECTED NOT DETECTED Final   Pseudomonas aeruginosa NOT DETECTED NOT DETECTED Final   Candida albicans NOT DETECTED NOT DETECTED Final   Candida glabrata NOT DETECTED NOT DETECTED Final   Candida krusei NOT DETECTED NOT DETECTED Final   Candida parapsilosis NOT DETECTED NOT DETECTED Final   Candida tropicalis NOT DETECTED NOT DETECTED Final    Comment: Performed at [facility], [address], [place], [place] [address]  Urine Culture     Status: None   Collection Time: 10/06/19 12:51 AM   Specimen: Urine, Random  Result Value Ref Range Status   Specimen Description   Final    URINE, RANDOM Performed at [facility], [address], [place], [place] [address]    Special Requests   Final    NONE Performed at [facility], [address] [place], [place]  [address]    Culture   Final    NO GROWTH Performed at [facility], [place] [address] [place], [place] [address]    Report Status 10/07/2019 FINAL  Final  SARS CORONAVIRUS 2 (TAT 6-24 HRS) Nasopharyngeal Nasopharyngeal Swab     Status: None   Collection Time: 10/06/19  2:30 AM   Specimen: Nasopharyngeal Swab  Result Value Ref Range Status   SARS Coronavirus 2 NEGATIVE NEGATIVE Final    Comment: (NOTE) SARS-CoV-2 target nucleic acids are NOT DETECTED. The SARS-CoV-2 RNA is generally detectable in upper and lower respiratory specimens during the acute phase of infection. Negative results do not preclude SARS-CoV-2 infection, do not rule out co-infections with other pathogens, and should not be used as the sole basis for treatment or other patient management decisions. Negative results must be combined with clinical observations, patient history, and epidemiological information. The expected result is Negative. Fact Sheet for Patients: SugarRoll.be Fact Sheet for Healthcare Providers: https://www.woods-mathews.com/ This test is not yet approved or cleared by the [place] FDA and  has been authorized for detection and/or diagnosis of SARS-CoV-2 by FDA under an Emergency Use Authorization (EUA). This EUA will remain  in effect (meaning this test can be used) for the duration of the COVID-19 declaration under Section 56 4(b)(1) of the Act, 21 U.S.C. section 360bbb-3(b)(1), unless the authorization is terminated or revoked sooner. Performed at [facility], [place] [address] [place], [place] [address]   Respiratory Panel by PCR     Status: None   Collection Time: 10/06/19  3:40 AM   Specimen: Nasopharyngeal Swab; Respiratory  Result Value Ref Range Status  Adenovirus NOT DETECTED NOT DETECTED Final   Coronavirus 229E NOT DETECTED NOT DETECTED Final    Comment: (NOTE) The Coronavirus on the Respiratory Panel, DOES NOT test for the  novel  Coronavirus (2019 nCoV)    Coronavirus HKU1 NOT DETECTED NOT DETECTED Final   Coronavirus NL63 NOT DETECTED NOT DETECTED Final   Coronavirus OC43 NOT DETECTED NOT DETECTED Final   Metapneumovirus NOT DETECTED NOT DETECTED Final   Rhinovirus / Enterovirus NOT DETECTED NOT DETECTED Final   Influenza A NOT DETECTED NOT DETECTED Final   Influenza B NOT DETECTED NOT DETECTED Final   Parainfluenza Virus 1 NOT DETECTED NOT DETECTED Final   Parainfluenza Virus 2 NOT DETECTED NOT DETECTED Final   Parainfluenza Virus 3 NOT DETECTED NOT DETECTED Final   Parainfluenza Virus 4 NOT DETECTED NOT DETECTED Final   Respiratory Syncytial Virus NOT DETECTED NOT DETECTED Final   Bordetella pertussis NOT DETECTED NOT DETECTED Final   Chlamydophila pneumoniae NOT DETECTED NOT DETECTED Final   Mycoplasma pneumoniae NOT DETECTED NOT DETECTED Final    Comment: Performed at [facility], [place] [address], [place], [place] [address]  MRSA PCR Screening     Status: None   Collection Time: 10/06/19  3:41 AM   Specimen: Nasopharyngeal  Result Value Ref Range Status   MRSA by PCR NEGATIVE NEGATIVE Final    Comment:        The GeneXpert MRSA Assay (FDA approved for NASAL specimens only), is one component of a comprehensive MRSA colonization surveillance program. It is not intended to diagnose MRSA infection nor to guide or monitor treatment for MRSA infections. Performed at [facility], [address] [place], [place] [address]   CULTURE, BLOOD (ROUTINE X 2) w Reflex to ID Panel     Status: None   Collection Time: 10/07/19  1:17 AM   Specimen: BLOOD  Result Value Ref Range Status   Specimen Description BLOOD LEFT HAND  Final   Special Requests   Final    BOTTLES DRAWN AEROBIC AND ANAEROBIC Blood Culture adequate volume   Culture   Final    NO GROWTH 5 DAYS Performed at [facility], [address] [place], [place] [address]    Report Status 10/12/2019 FINAL  Final   CULTURE, BLOOD (ROUTINE X 2) w Reflex to ID Panel     Status: None   Collection Time: 10/07/19  1:17 AM   Specimen: BLOOD  Result Value Ref Range Status   Specimen Description BLOOD LEFT WRIST  Final   Special Requests   Final    BOTTLES DRAWN AEROBIC AND ANAEROBIC Blood Culture adequate volume   Culture   Final    NO GROWTH 5 DAYS Performed at [facility], [address] [place], [place] [address]    Report Status 10/12/2019 FINAL  Final  CSF culture     Status: None   Collection Time: 10/07/19  1:22 PM   Specimen: PATH Cytology CSF; Cerebrospinal Fluid  Result Value Ref Range Status   Specimen Description   Final    CSF Performed at [facility], [address] [place], [place] [address]    Special Requests   Final    NONE Performed at [facility], [address] [place], [place] [address]    Gram Stain   Final    NO ORGANISMS SEEN WBC SEEN RED BLOOD CELLS RESULT CALLED TO, READ BACK BY AND VERIFIED WITH: MEGAN SCHEFFIELD 10/07/19 @ 1526  [place]  Performed at [facility], [address] [place], [place] [address]    Culture   Final    NO GROWTH 3 DAYS Performed at [facility], [place] [address], [place], [place] [address]    Report Status 10/11/2019 FINAL  Final  Fungus Culture With Stain     Status: None (Preliminary result)   Collection Time: 10/07/19  1:22 PM   Specimen: PATH Cytology CSF; Cerebrospinal Fluid  Result Value Ref Range Status   Fungus Stain Final report  Final    Comment: (NOTE) Performed At: [facility] [place], [place] [id number] Rush Farmer MD [id number]    Fungus (Mycology) Culture PENDING  Incomplete   Fungal Source CSF  Final    Comment: Performed at [facility], [address] [place], [place] [address]  Fungus Culture Result     Status: None   Collection Time: 10/07/19  1:22 PM  Result Value Ref Range Status   Result 1 Comment  Final    Comment:  (NOTE) KOH/Calcofluor preparation:  no fungus observed. Performed At: [facility] [place], [place] [id number] Rush Farmer MD [id number]          Radiology Studies: No results found.      Scheduled Meds: . budesonide (PULMICORT) nebulizer solution  0.5 mg Nebulization BID  . chlorhexidine gluconate (MEDLINE KIT)  15 mL Mouth Rinse BID  . Chlorhexidine Gluconate Cloth  6 each Topical Q0600  . enoxaparin (LOVENOX) injection  40 mg Subcutaneous Daily  . insulin aspart  0-15 Units Subcutaneous Q4H  . insulin detemir  8 Units Subcutaneous BID  . ipratropium-albuterol  3 mL Nebulization Q6H  . nystatin  5 mL Oral QID  . [START ON 10/14/2019] Ensure Max Protein  11 oz Oral BID BM   Continuous Infusions: . sodium chloride Stopped (10/07/19 1106)  . cefTRIAXone (ROCEPHIN)  IV Stopped (10/13/19 0940)  . dextrose 50 mL/hr at 10/13/19 1428  . famotidine (PEPCID) IV Stopped (10/13/19 1429)     LOS: 7 days    Time spent: 45-50 minutes    Ezekiel Slocumb, DO [facility]   If 7PM-7AM, please contact night-coverage [email or web address] 10/13/2019, 5:20 PM

## 2019-10-13 NOTE — Progress Notes (Signed)
Nutrition Follow-up  DOCUMENTATION CODES:   Obesity unspecified  INTERVENTION:  Provide Ensure Max Protein po BID, each supplement provides 150 kcal and 30 grams of protein. Patient prefers vanilla.  NUTRITION DIAGNOSIS:   Inadequate oral intake related to inability to eat as evidenced by NPO status.  Resolving - diet was advanced today.  GOAL:   Patient will meet greater than or equal to 90% of their needs  Progressing.  MONITOR:   PO intake, Supplement acceptance, Labs, Weight trends, I & O's  REASON FOR ASSESSMENT:   Ventilator    ASSESSMENT:   59 year old female with PMHx of asthma, COPD, HTN, pre-diabetes admitted with acute respiratory failure secondary to metabolic acidosis, acute toxic metabolic encephalopathy, and sepsis etiology unclear although possibly secondary to meningitis requiring mechanical intubation.  Patient was extubated on 1/29. Following SLP evaluation today diet was advanced to dysphagia 2 with thin liquids. Met with patient at bedside this morning. She had not received a meal tray yet as her diet had just been advanced. Will monitor for adequacy of intake. Patient is amenable to drinking ONS to help meet calorie/protein needs.  Medications reviewed and include: Novolog 0-15 units Q4hrs, Levemir 8 units BID, ceftriaxone, D5W at 50 mL/hr, famotidine.  Labs reviewed: CBG 99-142, Sodium 150, Phosphorus 2.2, Magnesium 2.7.  Discussed with RN and on rounds.  Diet Order:   Diet Order            DIET DYS 2 Room service appropriate? Yes with Assist; Fluid consistency: Thin  Diet effective now             EDUCATION NEEDS:   No education needs have been identified at this time  Skin:  Skin Assessment: Reviewed RN Assessment  Last BM:  10/13/2019  Height:   Ht Readings from Last 1 Encounters:  10/05/19 '5\' 5"'$  (1.651 m)   Weight:   Wt Readings from Last 1 Encounters:  10/13/19 103 kg   Ideal Body Weight:  56.8 kg  BMI:  Body mass index  is 37.79 kg/m.  Estimated Nutritional Needs:   Kcal:  2000-2200  Protein:  100-110 grams  Fluid:  2 L/day  Jacklynn Barnacle, MS, RD, LDN Office: [phone] Pager: [phone] After Hours/Weekend Pager: [phone]

## 2019-10-13 NOTE — Plan of Care (Signed)

## 2019-10-13 NOTE — Progress Notes (Signed)
Assisted tele visit to patient with son.  Chanon Loney R, RN  

## 2019-10-13 NOTE — Progress Notes (Signed)
ID Awake and alert Out of ICU Extubated saturday   BP (!) 149/72   Pulse 92   Temp 98.8 F (37.1 C) (Axillary)   Resp 14   Ht 5\' 5"  (1.651 m)   Wt 103 kg   LMP 09/30/2014   SpO2 95%   BMI 37.79 kg/m    Awake and alert, talking well No neck rigidity Chest b/l air entry Hss1s2 CNS non focal  CBC Latest Ref Rng & Units 10/13/2019 10/12/2019 10/11/2019  WBC 4.0 - 10.5 K/uL 12.0(H) 12.6(H) 13.1(H)  Hemoglobin 12.0 - 15.0 g/dL 12.0 12.2 11.9(L)  Hematocrit 36.0 - 46.0 % 37.3 37.9 36.3  Platelets 150 - 400 K/uL 245 337 362    CMP Latest Ref Rng & Units 10/13/2019 10/12/2019 10/11/2019  Glucose 70 - 99 mg/dL 129(H) 101(H) -  BUN 6 - 20 mg/dL 18 26(H) -  Creatinine 0.44 - 1.00 mg/dL 0.75 0.76 -  Sodium 135 - 145 mmol/L 150(H) 153(H) -  Potassium 3.5 - 5.1 mmol/L 3.6 3.2(L) 3.3(L)  Chloride 98 - 111 mmol/L 111 111 -  CO2 22 - 32 mmol/L 30 32 -  Calcium 8.9 - 10.3 mg/dL 8.8(L) 8.6(L) -  Total Protein 6.5 - 8.1 g/dL - - -  Total Bilirubin 0.3 - 1.2 mg/dL - - -  Alkaline Phos 38 - 126 U/L - - -  AST 15 - 41 U/L - - -  ALT 0 - 44 U/L - - -      Impression/recommendation   Pneumococcus bacteremia and bacterial meningitis On ceftriaxone day 8 Need TEE to r/o endocarditis Discussed with Dr.Arida

## 2019-10-13 NOTE — Progress Notes (Signed)
Rehab Admissions Coordinator Note:  Patient was screened by Cleatrice Burke for appropriateness for an Inpatient Acute Rehab admit at [facility] in [place].  I will place order for Cir consult so that we can fully assess her rehab needs.Cleatrice Burke RN MSN 10/13/2019, 7:18 PM  I can be reached at [phone].

## 2019-10-13 NOTE — Consult Note (Signed)
Cardiology Consultation:   Patient ID: Cathy Hughes MRN: [id number]; DOB: [birth year]  Admit date: 10/05/2019 Date of Consult: 10/13/2019  Primary Care Provider: Donnie Coffin, MD Primary Cardiologist: No primary care provider on file.  Primary Electrophysiologist:  None    Patient Profile:   Cathy Hughes is a 59 y.o. female with a hx of COPD, essential hypertension and prediabetes who is being seen today for the evaluation of Streptococcus pneumonia bacteremia and the need for TEE at the request of Dr. Delaine Lame.  History of Present Illness:   Cathy Hughes is a 59 year old African-American female with no prior cardiac history.  She initially presented on the 24th the ED with left ear pain and was discharged home on prednisone and pain medications.  She presented back 2 days later with severe headache and became somnolent in the ED.  She was suspected of being septic and was intubated.  Lumbar puncture came back positive for strep pneumonia and this was also positive and blood culture.  The patient gradually improved with antibiotics.She was extubated 2 days ago. ID is requesting transesophageal echocardiogram to rule out endocarditis.  Transthoracic echocardiogram showed no evidence of vegetations. The patient denies chest pain or shortness of breath.  No palpitations or dizziness.  Heart Pathway Score:     Past Medical History:  Diagnosis Date  . Asthma   . COPD (chronic obstructive pulmonary disease) ([place])   . Hypertension   . Pre-diabetes     Past Surgical History:  Procedure Laterality Date  . CHOLECYSTECTOMY    . COLON SURGERY    . thumb surgery       Home Medications:  Prior to Admission medications   Medication Sig Start Date End Date Taking? Authorizing Provider  amLODipine (NORVASC) 5 MG tablet Take 1 tablet (5 mg total) by mouth daily. 05/14/19  Yes Hubbard Hartshorn, FNP  fluticasone (FLONASE) 50 MCG/ACT nasal spray Place 2 sprays into both nostrils daily. 10/05/19  10/04/20 Yes Summers, Rhonda L, PA-C  oxyCODONE (OXY IR/ROXICODONE) 5 MG immediate release tablet Take 1 tablet (5 mg total) by mouth every 6 (six) hours as needed for severe pain. 10/05/19  Yes Johnn Hai, PA-C  predniSONE (DELTASONE) 10 MG tablet Take 6 tablets  today, on day 2 take 5 tablets, day 3 take 4 tablets, day 4 take 3 tablets, day 5 take  2 tablets and 1 tablet the last day 10/05/19  Yes Letitia Neri L, PA-C  albuterol (PROVENTIL HFA;VENTOLIN HFA) 108 (90 Base) MCG/ACT inhaler Inhale 2-4 puffs by mouth every 4 hours as needed for wheezing, cough, and/or shortness of breath 07/31/18   Hubbard Hartshorn, FNP  ipratropium-albuterol (DUONEB) 0.5-2.5 (3) MG/3ML SOLN Take 3 mLs by nebulization every 6 (six) hours as needed. 07/31/18   Hubbard Hartshorn, FNP  montelukast (SINGULAIR) 10 MG tablet Take 1 tablet (10 mg total) by mouth daily. 08/05/18 08/05/19  Tyler Pita, MD  omeprazole (PRILOSEC) 20 MG capsule Take 1 capsule (20 mg total) by mouth 2 (two) times daily. 09/02/18 12/01/18  Tyler Pita, MD  tiZANidine (ZANAFLEX) 4 MG tablet Take 0.5-1 tablets (2-4 mg total) by mouth every 6 (six) hours as needed for muscle spasms. Patient not taking: Reported on 10/06/2019 08/19/18   Hubbard Hartshorn, FNP    Inpatient Medications: Scheduled Meds: . budesonide (PULMICORT) nebulizer solution  0.5 mg Nebulization BID  . chlorhexidine gluconate (MEDLINE KIT)  15 mL Mouth Rinse BID  . Chlorhexidine Gluconate Cloth  6 each Topical Q0600  . enoxaparin (LOVENOX) injection  40 mg Subcutaneous Daily  . insulin aspart  0-15 Units Subcutaneous Q4H  . insulin detemir  8 Units Subcutaneous BID  . ipratropium-albuterol  3 mL Nebulization Q6H  . nystatin  5 mL Oral QID  . [START ON 10/14/2019] Ensure Max Protein  11 oz Oral BID BM   Continuous Infusions: . sodium chloride Stopped (10/07/19 1106)  . cefTRIAXone (ROCEPHIN)  IV Stopped (10/13/19 0940)  . dextrose 50 mL/hr at 10/13/19 1428  .  famotidine (PEPCID) IV Stopped (10/13/19 1429)   PRN Meds: acetaminophen, hydrALAZINE, morphine injection, ondansetron **OR** ondansetron (ZOFRAN) IV, sodium chloride, sodium chloride flush  Allergies:    Allergies  Allergen Reactions  . Aspirin Hives  . Tramadol Other (See Comments)    Headache   . Tylenol [Acetaminophen] Hives    Social History:   Social History   Socioeconomic History  . Marital status: Married    Spouse name: [place]  . Number of children: 3  . Years of education: Not on file  . Highest education level: Not on file  Occupational History  . Not on file  Tobacco Use  . Smoking status: Current Some Day Smoker    Packs/day: 0.50    Years: 35.00    Pack years: 17.50    Types: Cigarettes  . Smokeless tobacco: Never Used  Substance and Sexual Activity  . Alcohol use: Yes    Comment: occ  . Drug use: No  . Sexual activity: Yes    Partners: Male  Other Topics Concern  . Not on file  Social History Narrative  . Not on file   Social Determinants of Health   Financial Resource Strain:   . Difficulty of Paying Living Expenses: Not on file  Food Insecurity:   . Worried About Charity fundraiser in the Last Year: Not on file  . Ran Out of Food in the Last Year: Not on file  Transportation Needs:   . Lack of Transportation (Medical): Not on file  . Lack of Transportation (Non-Medical): Not on file  Physical Activity:   . Days of Exercise per Week: Not on file  . Minutes of Exercise per Session: Not on file  Stress:   . Feeling of Stress : Not on file  Social Connections:   . Frequency of Communication with Friends and Family: Not on file  . Frequency of Social Gatherings with Friends and Family: Not on file  . Attends Religious Services: Not on file  . Active Member of Clubs or Organizations: Not on file  . Attends Archivist Meetings: Not on file  . Marital Status: Not on file  Intimate Partner Violence:   . Fear of Current or  Ex-Partner: Not on file  . Emotionally Abused: Not on file  . Physically Abused: Not on file  . Sexually Abused: Not on file    Family History:    Family History  Problem Relation Age of Onset  . Diabetes Mother   . Hypertension Mother   . Diabetes Father   . Hypertension Father      ROS:  Please see the history of present illness.   All other ROS reviewed and negative.     Physical Exam/Data:   Vitals:   10/13/19 0905 10/13/19 1000 10/13/19 1226 10/13/19 1404  BP: (!) 151/76 (!) 149/72 (!) 178/82   Pulse: 87 92 82   Resp: 15 14    Temp:  98 F (36.7 C)   TempSrc:   Oral   SpO2: 94% 95% 97% 96%  Weight:      Height:        Intake/Output Summary (Last 24 hours) at 10/13/2019 1551 Last data filed at 10/13/2019 1520 Gross per 24 hour  Intake 3740.42 ml  Output 2290 ml  Net 1450.42 ml   Last 3 Weights 10/13/2019 10/10/2019 10/09/2019  Weight (lbs) 227 lb 1.2 oz 233 lb 11 oz 232 lb 12.9 oz  Weight (kg) 103 kg 106 kg 105.6 kg     Body mass index is 37.79 kg/m.  General:  Well nourished, well developed, in no acute distress HEENT: normal Lymph: no adenopathy Neck: no JVD Endocrine:  No thryomegaly Vascular: No carotid bruits; FA pulses 2+ bilaterally without bruits  Cardiac:  normal S1, S2; RRR; no murmur  Lungs:  clear to auscultation bilaterally, no wheezing, rhonchi or rales  Abd: soft, nontender, no hepatomegaly  Ext: no edema Musculoskeletal:  No deformities, BUE and BLE strength normal and equal Skin: warm and dry  Neuro:  CNs 2-12 intact, no focal abnormalities noted Psych:  Normal affect   EKG:  The EKG was personally reviewed and demonstrates: Sinus rhythm with nonspecific ST changes   Relevant CV Studies: Echocardiogram was done on January 26 of 2021:  1. Left ventricular ejection fraction, by visual estimation, is 60 to  65%. The left ventricle has normal function. There is mildly increased  left ventricular hypertrophy.  2. Left ventricular  diastolic parameters are consistent with Grade I  diastolic dysfunction (impaired relaxation).  3. The left ventricle has no regional wall motion abnormalities.  4. Global right ventricle has normal systolic function.The right  ventricular size is normal. Right vetricular wall thickness was not  assessed.  5. Left atrial size was normal.  6. Right atrial size was normal.  7. Mild mitral annular calcification.  8. The mitral valve is degenerative. No evidence of mitral valve  regurgitation. No evidence of mitral stenosis.  9. The tricuspid valve is not well visualized.  10. The tricuspid valve is not well visualized. Tricuspid valve  regurgitation is not demonstrated.  11. The aortic valve was not well visualized. Aortic valve regurgitation  is not visualized. No evidence of aortic valve sclerosis or stenosis.  12. The pulmonic valve was not well visualized. Pulmonic valve  regurgitation is not visualized.  13. TR signal is inadequate for assessing pulmonary artery systolic  pressure.  14. The interatrial septum was not well visualized.   Laboratory Data:  High Sensitivity Troponin:   Recent Labs  Lab 10/05/19 2237 10/06/19 0138  TROPONINIHS 4 9     Chemistry Recent Labs  Lab 10/11/19 0607 10/11/19 0607 10/11/19 1953 10/12/19 0557 10/13/19 0313  NA 156*  --   --  153* 150*  K 2.9*   < > 3.3* 3.2* 3.6  CL 113*  --   --  111 111  CO2 33*  --   --  32 30  GLUCOSE 145*  --   --  101* 129*  BUN 34*  --   --  26* 18  CREATININE 0.76  --   --  0.76 0.75  CALCIUM 9.4  --   --  8.6* 8.8*  GFRNONAA >60  --   --  >60 >60  GFRAA >60  --   --  >60 >60  ANIONGAP 10  --   --  10 9   < > = values in  this interval not displayed.    Recent Labs  Lab 10/08/19 0553  PROT 7.0  ALBUMIN 2.7*  AST 21  ALT 23  ALKPHOS 55  BILITOT 0.5   Hematology Recent Labs  Lab 10/11/19 0607 10/12/19 0557 10/13/19 0313  WBC 13.1* 12.6* 12.0*  RBC 4.25 4.43 4.34  HGB 11.9* 12.2  12.0  HCT 36.3 37.9 37.3  MCV 85.4 85.6 85.9  MCH 28.0 27.5 27.6  MCHC 32.8 32.2 32.2  RDW 15.9* 15.9* 16.0*  PLT 362 337 245   BNPNo results for input(s): BNP, PROBNP in the last 168 hours.  DDimer No results for input(s): DDIMER in the last 168 hours.    Assessment and Plan:   1. Streptococcus pneumonia and meningitis: Improving with antibiotics.  A transesophageal echocardiogram is indicated to exclude the possibility of infective endocarditis.  I discussed the procedure in details as well as risks and benefits and she is willing to proceed but she prefers to wait another day until she is stronger which I think is reasonable especially with continued hypernatremia.  We will plan tentatively on doing the transesophageal echocardiogram on Wednesday morning. 2. Hypernatremia: Today sodium is 150 and seems to be gradually improving.       For questions or updates, please contact [place] Please consult [email or web address] for contact info under     Signed, Kathlyn [place], MD  10/13/2019 3:51 PM

## 2019-10-13 NOTE — Evaluation (Addendum)
Clinical/Bedside Swallow Evaluation Patient Details  Name: Cathy Hughes MRN: [id number] Date of Birth: 1961/05/02  Today's Date: 10/13/2019 Time: SLP Start Time (ACUTE ONLY): 73 SLP Stop Time (ACUTE ONLY): 1045 SLP Time Calculation (min) (ACUTE ONLY): 60 min  Past Medical History:  Past Medical History:  Diagnosis Date  . Asthma   . COPD (chronic obstructive pulmonary disease) ([place])   . Hypertension   . Pre-diabetes    Past Surgical History:  Past Surgical History:  Procedure Laterality Date  . CHOLECYSTECTOMY    . COLON SURGERY    . thumb surgery     HPI:  Pt is a 59 y.o. female with medical history significant for Obesity, prediabetes, hypertension, COPD, Nicotine dependence, back pain, perennial rhinitis, per review of records who initially presented to the ER early on 10/05/2019 with a complaint of severe left ear pain.  She was treated and discharged on oxycodone and prednisone.  She returned to the emergency room later on in the day complaining of headache that started soon after taking the oxycodone.  While being reevaluated in the emergency room, she developed altered mental status, became very lethargic and difficult to arouse and appeared tachypneic.  Subsequently became very agitated. most of the history is taken from ER records due to altered mental status, mostly restless and agitated.  Pt was orally intubated, now extubated post ~4 days. She required Bipap post extubation d/t encephalopathy.    Assessment / Plan / Recommendation Clinical Impression  Pt appears to present w/ grossly adequate oropharyngeal phase swallow function though Mild oral phase deficits w/ increased textured/solid foods d/t Edentulous status and overall weakness. Pt also presents w/ potential Thrush(MD aware), and continued Mild Cognitive confusion requiring min cues for redirection to tasks - pt has recent Encephalopathy per MD notes this admission. Pt is oriented to self, husband, and place. Pt does  NOT wear her Dentures baseline at home per her report. Currently, she is extremely weak w/ increased RUE weakness noted, and she exhibits more of a munching mashing/gumming pattern during the bolus management of trials of solids during the Oral phase. Given Time, pt was able to mash/gum sufficiently for A-P transfer, swallow, and oral clearing. SLP encouraged pt to alternate foods/liquids to aid oral clearing; small bites of foods. During the Pharyngeal phase, no overt clinical s/s of aspriation were noted; no overt coughing, or decline in vocal quality noted during/post trials. Min expiratory effort noted during oral tasks as po trials continued -- Rest Breaks given intermittently during po trials. HR 95, RR 15-18, O2 sats 95% during tx session w/ No significant alter. OM exam presented w/ no overt lingual/labial weakness; apparent presence of Thrush(MD made aware). Edentulous.  Recommend a dysphagia level 2 (minced foods w/ gravies to moisten) for conservation of energy and ease of Oral phase management w/ Thin liquids; NO Straws. General aspiration precautions; Pills in Puree for safer swallowing. Tray setup and support at meals d/t RUE weakness. ST services will monitor pt's status while admitted. Education give to pt/Husband on precautions and recommendations as posted in room.  SLP Visit Diagnosis: Dysphagia, oral phase (R13.11)(Edentulous status baseline)    Aspiration Risk  Mild aspiration risk;Risk for inadequate nutrition/hydration(but reduced following general aspiration precs.)    Diet Recommendation     Medication Administration: Whole meds with puree(vs Crushed in Puree for safer swallowing)    Other  Recommendations Recommended Consults: (Dietician f/u) Oral Care Recommendations: Oral care BID;Oral care before and after PO;Staff/trained caregiver to provide oral  care Other Recommendations: (n/a)   Follow up Recommendations None      Frequency and Duration min 2x/week  1 week        Prognosis Prognosis for Safe Diet Advancement: Fair(-Good) Barriers to Reach Goals: Time post onset;Severity of deficits Barriers/Prognosis Comment: pt may need f/u w/ Cognitive assessment post discharge if not returned to full baseline Cognitively      Swallow Study   General Date of Onset: 10/05/19 HPI: Pt is a 59 y.o. female with medical history significant for prediabetes, hypertension, COPD, nicotine dependence, perennial rhinitis, per review of records who initially presented to the ER early on 10/05/2019 with a complaint of severe left ear pain.  She was treated and discharged on oxycodone and prednisone.  She returned to the emergency room later on in the day complaining of headache that started soon after taking the oxycodone.  While being reevaluated in the emergency room, she developed altered mental status, became very lethargic and difficult to arouse and appeared tachypneic.  Subsequently became very agitated. most of the history is taken from ER records due to altered mental status, mostly restless and agitated.  Pt was orally intubated, now extubated post ~4 days. She required Bipap post extubation d/t encephalopathy.  Type of Study: Bedside Swallow Evaluation Previous Swallow Assessment: none Diet Prior to this Study: NPO(regular diet at home) Temperature Spikes Noted: No(wbc 12.0 declining) Respiratory Status: Nasal cannula(3L) History of Recent Intubation: Yes Length of Intubations (days): 5 days Date extubated: 10/10/19 Behavior/Cognition: Alert;Cooperative;Confused;Pleasant mood;Distractible;Requires cueing(min) Oral Cavity Assessment: Dry(apparent Thrush; sore places around mouth/dry lips) Oral Care Completed by SLP: Yes Oral Cavity - Dentition: Edentulous(does not wear her Dentures at home per pt) Vision: Functional for self-feeding Self-Feeding Abilities: Able to feed self;Needs assist;Needs set up(RUE weakness) Patient Positioning: Upright in bed(needed  positioning) Baseline Vocal Quality: Normal Volitional Cough: Strong;Congested(min) Volitional Swallow: Able to elicit    Oral/Motor/Sensory Function Overall Oral Motor/Sensory Function: Within functional limits   Ice Chips Ice chips: Within functional limits Presentation: Spoon(fed; 2 trials)   Thin Liquid Thin Liquid: Within functional limits Presentation: Cup;Self Fed(~3-4 ozs; then sips w/ husband)    Nectar Thick Nectar Thick Liquid: Not tested   Honey Thick Honey Thick Liquid: Not tested   Puree Puree: Within functional limits(grossly) Presentation: Spoon(fed; 6 trials)   Solid     Solid: Impaired(min) Presentation: Spoon(fed; 6 trials) Oral Phase Impairments: Impaired mastication(edentulous; weak munching/mashing pattern) Oral Phase Functional Implications: Prolonged oral transit;Impaired mastication Pharyngeal Phase Impairments: (none)        Orinda Kenner, MS, CCC-SLP [facility] [place] 10/13/2019,12:44 PM

## 2019-10-13 NOTE — Evaluation (Addendum)
Physical Therapy Evaluation Patient Details Name: Cathy Hughes MRN: [id number] DOB: 02/07/1961 Today's Date: 10/13/2019   History of Present Illness  59 yo female admitted with headache s/p oxycodone, became minimally responsive after receiving narcotic and benzodiazepine low dose., Intubated 1/25 extubated 1/29. Work up + step pneumonia, streptococcal meningitis.    Clinical Impression  Patient alert, fearful/anxious during mobility part of PT session, needed encouragement and active listening. Pt reported that she lives with her husband, previously independent. Denies any falls.  The patient demonstrated bed rolling with minA and use of bed rails for bedpan use. Supine to sit with ModA for trunk elevation and max cueing, modA to maintain sitting balance variable ability to maintain sitting balance, improved balance noted with BUE and BLE supported, though decreased with fatigue. Pt endorsed dizziness immediately upon sitting, BP reading unclear, pt reported improvement in symptoms with time. Pt able to rise from elevated bed with minAx2, deficits noted once in standing, weaving and instability noted. Attempted side step to the L, maxAx2 to return to EOB safely due to R knee buckling.  Overall the patient demonstrated deficits (see "PT Problem List") that impede the patient's functional abilities, safety, and mobility and would benefit from skilled PT intervention.   Of note, RN informed of pt reported/observable R sided weakness. Pt stated that this is not her baseline.     Follow Up Recommendations CIR   Equipment Recommendations  Rolling walker with 5" wheels;Other (comment);3in1 (PT)(TBD at next venue of care)    Recommendations for Other Services       Precautions / Restrictions Precautions Precautions: Fall Restrictions Weight Bearing Restrictions: No      Mobility  Bed Mobility Overal bed mobility: Needs Assistance Bed Mobility: Sit to Supine;Supine to  Sit;Rolling Rolling: Min assist(use of bed rails)   Supine to sit: Mod assist;HOB elevated     General bed mobility comments: step by step cueing for sequencing. modA from trunk elevation and initial sitting balance  Transfers Overall transfer level: Needs assistance Equipment used: 2 person hand held assist Transfers: Sit to/from Stand Sit to Stand: Min assist;+2 safety/equipment         General transfer comment: Pt able to rise from elevated bed with minAx2, deficits noted once in standing, weaving and instability noted. Attempted side step to the L, maxAx2 to return to EOB safely due to R knee buckling.  Ambulation/Gait             General Gait Details: deferred  Stairs            Wheelchair Mobility    Modified Rankin (Stroke Patients Only)       Balance Overall balance assessment: Needs assistance Sitting-balance support: Feet unsupported;Bilateral upper extremity supported Sitting balance-Leahy Scale: Poor Sitting balance - Comments: variable between modA-CGA for sitting balance, Notable lean/weaving with decreased UE support     Standing balance-Leahy Scale: Poor                               Pertinent Vitals/Pain Pain Assessment: No/denies pain    Home Living Family/patient expects to be discharged to:: Private residence Living Arrangements: Spouse/significant other Available Help at Discharge: Family Type of Home: House Home Access: Stairs to enter Entrance Stairs-Rails: Right;Left;Can reach both Entrance Stairs-Number of Steps: 3 Home Layout: One level Home Equipment: None      Prior Function Level of Independence: Independent  Hand Dominance        Extremity/Trunk Assessment   Upper Extremity Assessment Upper Extremity Assessment: Defer to OT evaluation;Generalized weakness    Lower Extremity Assessment Lower Extremity Assessment: RLE deficits/detail;LLE deficits/detail RLE Deficits / Details:  Pt able to perform heel slide, ankle pump, and SLR with verbal cues. decreased knee extension noted compared to LLE LLE Deficits / Details: independently able to perform SLR, ankle DF/PF, heel slides    Cervical / Trunk Assessment Cervical / Trunk Assessment: Kyphotic(in sitting)  Communication   Communication: No difficulties  Cognition Arousal/Alertness: Awake/alert Behavior During Therapy: WFL for tasks assessed/performed;Anxious Overall Cognitive Status: Within Functional Limits for tasks assessed                                 General Comments: A&Ox4. anxious/fearful of falling      General Comments      Exercises Other Exercises Other Exercises: Pt able to sit EOB for several minutes. Exhibited mod-CGA sitting balance, improved balancen oted with BUE and BLE supported, though decreased with fatigue. Pt endorsed dizziness immediately upon sitting, BP reading unclear, pt reported improvement in symptoms with time. Other Exercises: instructed in PLB in sitting to address dizziness   Assessment/Plan    PT Assessment Patient needs continued PT services  PT Problem List Decreased strength;Decreased mobility;Decreased range of motion;Decreased knowledge of precautions;Decreased activity tolerance;Decreased balance;Decreased knowledge of use of DME       PT Treatment Interventions DME instruction;Therapeutic exercise;Gait training;Balance training;Stair training;Neuromuscular re-education;Functional mobility training;Therapeutic activities;Patient/family education    PT Goals (Current goals can be found in the Care Plan section)  Acute Rehab PT Goals Patient Stated Goal: Patient wants to get better PT Goal Formulation: With patient Time For Goal Achievement: 10/27/19 Potential to Achieve Goals: Good    Frequency Min 2X/week   Barriers to discharge        Co-evaluation PT/OT/SLP Co-Evaluation/Treatment: Yes Reason for Co-Treatment: Complexity of the  patient's impairments (multi-system involvement);For patient/therapist safety;To address functional/ADL transfers PT goals addressed during session: Mobility/safety with mobility;Balance;Proper use of DME;Strengthening/ROM OT goals addressed during session: ADL's and self-care;Proper use of Adaptive equipment and DME       AM-PAC PT "6 Clicks" Mobility  Outcome Measure Help needed turning from your back to your side while in a flat bed without using bedrails?: A Lot Help needed moving from lying on your back to sitting on the side of a flat bed without using bedrails?: A Lot Help needed moving to and from a bed to a chair (including a wheelchair)?: Total Help needed standing up from a chair using your arms (e.g., wheelchair or bedside chair)?: A Little Help needed to walk in hospital room?: A Lot Help needed climbing 3-5 steps with a railing? : Total 6 Click Score: 11    End of Session Equipment Utilized During Treatment: Gait belt;Oxygen(3L) Activity Tolerance: Patient limited by fatigue Patient left: in bed;with call bell/phone within reach;with bed alarm set Nurse Communication: Mobility status PT Visit Diagnosis: Other abnormalities of gait and mobility (R26.89);Difficulty in walking, not elsewhere classified (R26.2);Unsteadiness on feet (R26.81)    Time: [id number] PT Time Calculation (min) (ACUTE ONLY): 38 min   Charges:   PT Evaluation $PT Eval Moderate Complexity: 1 Mod PT Treatments $Therapeutic Activity: 8-22 mins        Lieutenant Diego PT, DPT 3:41 PM,10/13/19

## 2019-10-13 NOTE — Evaluation (Signed)
Occupational Therapy Evaluation Patient Details Name: Cathy Hughes MRN: [id number] DOB: 1960/10/13 Today's Date: 10/13/2019    History of Present Illness 59 yo female admitted with headache s/p oxycodone, became minimally responsive after receiving narcotic and benzodiazepine low dose., Intubated 1/25 extubated 1/29. Work up + step pneumonia, streptococcal meningitis.   Clinical Impression   Pt was seen for OT evaluation this date. Prior to hospital admission, pt was Indep with ADLs/IADLs and used no AD for fxl mobility. Pt lives with spouse in [facility] with 3 STE. Currently pt demonstrates impairments as described below (See OT problem list) which functionally limit her ability to perform ADL/self-care tasks. Pt currently requires intermittent MOD A to maintain static sitting balance, MIN A x2 for sit<>stand, MOD A x2 for static standing/weight shift, MIN A for UB ADLs and MAX to TOTAL A for LB ADLs using sit/lateral lean technique d/t poor dynamic sitting balance.  Pt would benefit from skilled OT to address noted impairments and functional limitations (see below for any additional details) in order to maximize safety and independence while minimizing falls risk and caregiver burden. Upon hospital discharge, recommend CIR to maximize pt safety and return to PLOF.     Follow Up Recommendations  CIR    Equipment Recommendations  Other (comment)(defer to next venue of care)    Recommendations for Other Services       Precautions / Restrictions Precautions Precautions: Fall Restrictions Weight Bearing Restrictions: No      Mobility Bed Mobility Overal bed mobility: Needs Assistance Bed Mobility: Sit to Supine;Supine to Sit;Rolling Rolling: Min assist   Supine to sit: Mod assist;HOB elevated Sit to supine: Mod assist;+2 for physical assistance   General bed mobility comments: cues to sequence hand placement  Transfers Overall transfer level: Needs assistance Equipment used: 2  person hand held assist Transfers: Sit to/from Stand Sit to Stand: Min assist;+2 safety/equipment         General transfer comment: MIN verbal cues for hand placement in prep for arm in arm sit<>stand x2    Balance Overall balance assessment: Needs assistance Sitting-balance support: Feet unsupported;Bilateral upper extremity supported Sitting balance-Leahy Scale: Poor Sitting balance - Comments: pt mostly CGA while in static EOB sitting, but would intermittently require MOD A, noted to R lateral lean and require use of foot board for support. Postural control: Right lateral lean Standing balance support: Bilateral upper extremity supported Standing balance-Leahy Scale: Poor Standing balance comment: Pt requires MOD A x2 for static standing balance, some R LE buckling noted.                           ADL either performed or assessed with clinical judgement   ADL Overall ADL's : Needs assistance/impaired Eating/Feeding: Set up;Sitting   Grooming: Set up;Sitting           Upper Body Dressing : Moderate assistance;Bed level   Lower Body Dressing: Total assistance;Sitting/lateral leans     Toilet Transfer Details (indicate cue type and reason): unable to attempt SPS transfer this date, pt with POOR static sitting and standing balance with RW Toileting- Clothing Manipulation and Hygiene: Total assistance;Bed level       Functional mobility during ADLs: Moderate assistance;Rolling walker;+2 for physical assistance;+2 for safety/equipment(to take 1 shuffling side step toward her left to optimize positioning in prep for stand to sit to EOB)       Vision Patient Visual Report: No change from baseline  Perception     Praxis      Pertinent Vitals/Pain Pain Assessment: No/denies pain     Hand Dominance     Extremity/Trunk Assessment Upper Extremity Assessment Upper Extremity Assessment: Generalized weakness;RUE deficits/detail;LUE deficits/detail RUE  Deficits / Details: shoulder 3-/5, elbow and grip 3+/5 LUE Deficits / Details: shoulder, elbow, grip 4+/5   Lower Extremity Assessment Lower Extremity Assessment: Defer to PT evaluation(RLE noted to be weaker than LLE for fxl tasks assessed.) RLE Deficits / Details: Pt able to perform heel slide, ankle pump, and SLR with verbal cues. decreased knee extension noted compared to LLE LLE Deficits / Details: independently able to perform SLR, ankle DF/PF, heel slides   Cervical / Trunk Assessment Cervical / Trunk Assessment: Kyphotic   Communication Communication Communication: No difficulties   Cognition Arousal/Alertness: Awake/alert Behavior During Therapy: WFL for tasks assessed/performed;Anxious Overall Cognitive Status: Within Functional Limits for tasks assessed                                 General Comments: A&Ox4. anxious/fearful of falling   General Comments       Exercises Other Exercises Other Exercises: OT facilitates education re: role of OT in acute setting and in general to improve pt understanding of services with pt verbalizing understanding. Other Exercises: OT engages pt in ankle pumps while in EOB sitting to encourage increased circulation to help address dizziness   Shoulder Instructions      Home Living Family/patient expects to be discharged to:: Private residence Living Arrangements: Spouse/significant other Available Help at Discharge: Family Type of Home: House Home Access: Stairs to enter Technical brewer of Steps: 3 Entrance Stairs-Rails: Right;Left;Can reach both Home Layout: One level               Home Equipment: None          Prior Functioning/Environment Level of Independence: Independent                 OT Problem List: Decreased strength;Decreased activity tolerance;Impaired balance (sitting and/or standing);Decreased coordination;Decreased knowledge of use of DME or AE;Cardiopulmonary status limiting  activity;Impaired UE functional use      OT Treatment/Interventions: Self-care/ADL training;Therapeutic exercise;Energy conservation;DME and/or AE instruction;Therapeutic activities;Patient/family education;Balance training    OT Goals(Current goals can be found in the care plan section) Acute Rehab OT Goals Patient Stated Goal: Patient wants to get better OT Goal Formulation: With patient Time For Goal Achievement: 10/27/19 Potential to Achieve Goals: Good  OT Frequency: Min 1X/week   Barriers to D/C:            Co-evaluation PT/OT/SLP Co-Evaluation/Treatment: Yes Reason for Co-Treatment: Complexity of the patient's impairments (multi-system involvement);For patient/therapist safety;To address functional/ADL transfers PT goals addressed during session: Mobility/safety with mobility;Balance;Proper use of DME;Strengthening/ROM OT goals addressed during session: ADL's and self-care;Proper use of Adaptive equipment and DME      AM-PAC OT "6 Clicks" Daily Activity     Outcome Measure Help from another person eating meals?: None Help from another person taking care of personal grooming?: A Little Help from another person toileting, which includes using toliet, bedpan, or urinal?: A Lot Help from another person bathing (including washing, rinsing, drying)?: A Lot Help from another person to put on and taking off regular upper body clothing?: A Little Help from another person to put on and taking off regular lower body clothing?: A Lot 6 Click Score: 16   End of  Session Nurse Communication: Mobility status;Other (comment)(IV beeping)  Activity Tolerance: Patient tolerated treatment well Patient left: in bed;with call bell/phone within reach;with bed alarm set  OT Visit Diagnosis: Unsteadiness on feet (R26.81);Muscle weakness (generalized) (M62.81)                Time: [id number] OT Time Calculation (min): 29 min Charges:  OT General Charges $OT Visit: 1 Visit OT Evaluation $OT  Eval Moderate Complexity: 1 Mod OT Treatments $Self Care/Home Management : 8-22 mins  Gerrianne Scale, MS, OTR/L ascom [phone] 10/13/19, 5:20 PM

## 2019-10-13 NOTE — Consult Note (Signed)
PHARMACY CONSULT NOTE - FOLLOW UP  Pharmacy Consult for Electrolyte Monitoring and Replacement   Recent Labs: Potassium (mmol/L)  Date Value  10/13/2019 3.6   Magnesium (mg/dL)  Date Value  10/13/2019 2.7 (H)   Calcium (mg/dL)  Date Value  10/13/2019 8.8 (L)   Albumin (g/dL)  Date Value  10/08/2019 2.7 (L)   Phosphorus (mg/dL)  Date Value  10/13/2019 2.2 (L)   Sodium (mmol/L)  Date Value  10/13/2019 150 (H)   Corrected Ca: 9.8 mg/dL  Assessment: Pharmacy has been consulted to manage electrolytes for this 59 year-old female admitted  with pneumococcal bacteremia.  Patient has a PMH significant for prediabetes, hypertension, and COPD.  Renal function has improved and at what appears to be her baseline. Patient was extubated 1/29.    Electrolytes Goal:  All electrolytes WNL  10 mmol IV potassium phosphate (this provides 14.7 mEq IV potassium)  Hypernatremia continues to improve on D5 at 50 mL/hr  F/U Electrolytes with AM labs.  Glucose:  BG 95-123 over last 24 hrs.  A1c 8.2.  On D5W IVF at 50 ml/hr Total SSI required over the previous 24 hours: 4 units on Novolog 0-15 units q4 hours and Levemir 8 units BID  Bowel Regimen LBM 2/1  No Active bowel Regimen Will continue to monitor.   Dallie Piles ,PharmD 10/13/2019 7:14 AM

## 2019-10-14 LAB — RENAL FUNCTION PANEL
Albumin: 2.9 g/dL — ABNORMAL LOW (ref 3.5–5.0)
Anion gap: 11 (ref 5–15)
BUN: 12 mg/dL (ref 6–20)
CO2: 26 mmol/L (ref 22–32)
Calcium: 8.4 mg/dL — ABNORMAL LOW (ref 8.9–10.3)
Chloride: 106 mmol/L (ref 98–111)
Creatinine, Ser: 0.7 mg/dL (ref 0.44–1.00)
GFR calc Af Amer: >60 mL/min (ref >60)
GFR calc non Af Amer: >60 mL/min (ref >60)
Glucose, Bld: 155 mg/dL — ABNORMAL HIGH (ref 70–99)
Phosphorus: 2.1 mg/dL — ABNORMAL LOW (ref 2.5–4.6)
Potassium: 3.1 mmol/L — ABNORMAL LOW (ref 3.5–5.1)
Sodium: 143 mmol/L (ref 135–145)

## 2019-10-14 LAB — BASIC METABOLIC PANEL
Anion gap: 10 (ref 5–15)
BUN: 12 mg/dL (ref 6–20)
CO2: 27 mmol/L (ref 22–32)
Calcium: 8.5 mg/dL — ABNORMAL LOW (ref 8.9–10.3)
Chloride: 107 mmol/L (ref 98–111)
Creatinine, Ser: 0.74 mg/dL (ref 0.44–1.00)
GFR calc Af Amer: >60 mL/min (ref >60)
GFR calc non Af Amer: >60 mL/min (ref >60)
Glucose, Bld: 157 mg/dL — ABNORMAL HIGH (ref 70–99)
Potassium: 3.1 mmol/L — ABNORMAL LOW (ref 3.5–5.1)
Sodium: 144 mmol/L (ref 135–145)

## 2019-10-14 LAB — GLUCOSE, CAPILLARY
Glucose-Capillary: 139 mg/dL — ABNORMAL HIGH (ref 70–99)
Glucose-Capillary: 117 mg/dL — ABNORMAL HIGH (ref 70–99)
Glucose-Capillary: 156 mg/dL — ABNORMAL HIGH (ref 70–99)
Glucose-Capillary: 88 mg/dL (ref 70–99)

## 2019-10-14 LAB — CBC WITH DIFFERENTIAL/PLATELET
Abs Immature Granulocytes: 0.06 10*3/uL (ref 0.00–0.07)
Basophils Absolute: 0 10*3/uL (ref 0.0–0.1)
Basophils Relative: 0 %
Eosinophils Absolute: 0.2 10*3/uL (ref 0.0–0.5)
Eosinophils Relative: 2 %
HCT: 35.8 % — ABNORMAL LOW (ref 36.0–46.0)
Hemoglobin: 11.7 g/dL — ABNORMAL LOW (ref 12.0–15.0)
Immature Granulocytes: 1 %
Lymphocytes Relative: 27 %
Lymphs Abs: 3 10*3/uL (ref 0.7–4.0)
MCH: 27.4 pg (ref 26.0–34.0)
MCHC: 32.7 g/dL (ref 30.0–36.0)
MCV: 83.8 fL (ref 80.0–100.0)
Monocytes Absolute: 0.5 10*3/uL (ref 0.1–1.0)
Monocytes Relative: 5 %
Neutro Abs: 7.2 10*3/uL (ref 1.7–7.7)
Neutrophils Relative %: 65 %
Platelets: 294 10*3/uL (ref 150–400)
RBC: 4.27 MIL/uL (ref 3.87–5.11)
RDW: 15.6 % — ABNORMAL HIGH (ref 11.5–15.5)
WBC: 11 10*3/uL — ABNORMAL HIGH (ref 4.0–10.5)
nRBC: 0 % (ref 0.0–0.2)

## 2019-10-14 LAB — MAGNESIUM: Magnesium: 2.3 mg/dL (ref 1.7–2.4)

## 2019-10-14 MED ORDER — AMLODIPINE BESYLATE 10 MG PO TABS
10.0000 mg | ORAL_TABLET | Freq: Every day | ORAL | Status: DC
  Administered 2019-10-15: 11:00:00 10 mg via ORAL
  Filled 2019-10-14 (×2): qty 1

## 2019-10-14 MED ORDER — ACETAMINOPHEN 325 MG PO TABS
650.0000 mg | ORAL_TABLET | Freq: Four times a day (QID) | ORAL | Status: DC | PRN
  Administered 2019-10-14 (×3): 650 mg via ORAL
  Filled 2019-10-14 (×3): qty 2

## 2019-10-14 MED ORDER — LIVING WELL WITH DIABETES BOOK
Freq: Once | Status: AC
  Filled 2019-10-14: qty 1

## 2019-10-14 MED ORDER — POTASSIUM PHOSPHATES 15 MMOLE/5ML IV SOLN
20.0000 mmol | Freq: Once | INTRAVENOUS | Status: AC
  Administered 2019-10-14: 12:00:00 20 mmol via INTRAVENOUS
  Filled 2019-10-14: qty 6.67

## 2019-10-14 NOTE — Progress Notes (Signed)
   10/14/19 1800  Clinical Encounter Type  Visited With Patient  Visit Type Follow-up  Referral From Chaplain  Consult/Referral To Chaplain  Stress Factors  Patient Stress Factors Exhausted  Chaplain visited with patient as a FLU from another chaplain. Upon arrival, patient was laying in bed with the bed slightly elevated. Patient and chaplain had continuous dialogue about life in the hospital and life in general. Patient appeared to be in good spirits and eating. At the end of the visit, patent started attending her online service.    Nothing Further End of Visit

## 2019-10-14 NOTE — Progress Notes (Addendum)
Inpatient Rehabilitation-Admissions Coordinator   Called pt's room phone to begin discussion regarding recommended rehab program. Pt's husband answered the phone. He states he does not believe the patient would be interested in an intensive rehab program in [place] at this time. When asked to speak with the patient about the recommendation, he politely requested that I call back later as she was busy. Will continue attempts to make contact with the patient and discuss interest in CIR.   Raechel Ache, OTR/L  Rehab Admissions Coordinator  [phone] 10/14/2019 2:25 PM  Addendum 3:24PM: Spoke to [place], [place] regarding assistance with this case. Per Deliliah, the pt is declining CIR at this time. I will attempt to contact the pt one more time tomorrow to ensure her preference is for home with home health prior to signing off.   Raechel Ache, OTR/L  Rehab Admissions Coordinator  [phone] 10/14/2019 3:26 PM

## 2019-10-14 NOTE — Progress Notes (Signed)
Occupational Therapy Treatment Patient Details Name: Cathy Hughes MRN: [id number] DOB: 1960/12/08 Today's Date: 10/14/2019    History of present illness 59 yo female admitted with headache s/p oxycodone, became minimally responsive after receiving narcotic and benzodiazepine low dose., Intubated 1/25 extubated 1/29. Work up + step pneumonia, streptococcal meningitis.   OT comments  Pt seen for OT Tx this date. Cotx completed as pt with low tolerance for ADL mobility/transfers at this time. Pt tolerates some EOB sitting to engage in fxl tasks for several minutes with somewhat improved static sitting balance (requires less assistance for righting, returning to midline from R lateral lean), only intermittent CGA to MIN A noted. Pt tolerates brief period of standing (~1-2 mins while BP is taken and some necessary L lateral side steps taken for repositioning) with RW with MIN A x2 for stability/manuevering RW, and safety cues. Pt with positive orthostatics with positional change (drop from [id number] systolic sitting to [id number] systolic in standing) during session this date. Will continue to assess. Overall, while pt's tolerance is progressing, pt is still requiring significantly more assistance versus her PLOF. Anticipate CIR is still most beneficial d/c disposition.    Follow Up Recommendations  CIR    Equipment Recommendations  3 in 1 bedside commode;Other (comment)(grab bars in shower and next to commode)    Recommendations for Other Services      Precautions / Restrictions Precautions Precautions: Fall Restrictions Weight Bearing Restrictions: No       Mobility Bed Mobility Overal bed mobility: Needs Assistance Bed Mobility: Sit to Supine;Supine to Sit;Rolling Rolling: Min assist   Supine to sit: HOB elevated;Min guard Sit to supine: Mod assist;+2 for physical assistance   General bed mobility comments: extra time needed and UE support required for successful transfer. Pt eager to perform  without physical assist  Transfers Overall transfer level: Needs assistance Equipment used: Rolling walker (2 wheeled) Transfers: Sit to/from Stand Sit to Stand: Min assist;+2 safety/equipment         General transfer comment: MIN verbal/tactile cues for safe hand placement relative to RW for sit<>stand    Balance Overall balance assessment: Needs assistance Sitting-balance support: Feet unsupported;Bilateral upper extremity supported Sitting balance-Leahy Scale: Fair Sitting balance - Comments: Somewhat improved static sitting balance noted this date with less instance of R lateral lean requiring therapy intervention for righting. Postural control: Right lateral lean Standing balance support: Bilateral upper extremity supported Standing balance-Leahy Scale: Poor Standing balance comment: Pt requires MIN A x2 for static standing balance, MIN/MOD A x2 for fxl mobility (3-4 shuffling side steps) with R knee buckling/LOB x3 instances noted.                           ADL either performed or assessed with clinical judgement   ADL Overall ADL's : Needs assistance/impaired     Grooming: Min guard;Sitting Grooming Details (indicate cue type and reason): intemittent CGA-MIN A for static sitting balance while engaging in fxl task as pt otherwise uses UEs for seated support. Decreased seated balance assistance noted this date versus yesterday.                             Functional mobility during ADLs: Minimal assistance;+2 for physical assistance;+2 for safety/equipment;Rolling walker(to take 4-5 shuffling side steps to her left towards [facility] to optimize positioning in prep for stand to sit and sit to supine. In addition, pt demos instance  of LOB x3 with fxl mobility with R knee appearing to buckle.)       Vision Patient Visual Report: No change from baseline     Perception     Praxis      Cognition Arousal/Alertness: Awake/alert Behavior During Therapy: WFL  for tasks assessed/performed;Anxious Overall Cognitive Status: Within Functional Limits for tasks assessed                                 General Comments: some emotional lability noted this session (laughing/crying during session).        Exercises Other Exercises Other Exercises: OT engages pt in ankle pumps while in EOB sitting to encourage increased circulation to help address dizziness (of note, orthostatics taken this date and as follows: 154/84 in bed, 164/85 in EOB sitting, 141/86 in standing, so positive for orthostatics). Other Exercises: Pt tolerates standing ~1-2 mins for BP to be taken and to take some shuffling side steps to optimize positioning, but standing tolerance limited d/t dizziness which appears to be related to BP at this time.   Shoulder Instructions       General Comments      Pertinent Vitals/ Pain       Pain Assessment: No/denies pain  Home Living                                          Prior Functioning/Environment              Frequency  Min 3X/week        Progress Toward Goals  OT Goals(current goals can now be found in the care plan section)  Progress towards OT goals: Progressing toward goals  Acute Rehab OT Goals Patient Stated Goal: to go home OT Goal Formulation: With patient Time For Goal Achievement: 10/27/19 Potential to Achieve Goals: Good  Plan Discharge plan remains appropriate;Frequency needs to be updated    Co-evaluation    PT/OT/SLP Co-Evaluation/Treatment: Yes Reason for Co-Treatment: Complexity of the patient's impairments (multi-system involvement);For patient/therapist safety;To address functional/ADL transfers PT goals addressed during session: Mobility/safety with mobility;Proper use of DME;Strengthening/ROM OT goals addressed during session: ADL's and self-care;Proper use of Adaptive equipment and DME      AM-PAC OT "6 Clicks" Daily Activity     Outcome Measure   Help  from another person eating meals?: None Help from another person taking care of personal grooming?: A Little Help from another person toileting, which includes using toliet, bedpan, or urinal?: A Lot Help from another person bathing (including washing, rinsing, drying)?: A Lot Help from another person to put on and taking off regular upper body clothing?: A Little Help from another person to put on and taking off regular lower body clothing?: A Lot 6 Click Score: 16    End of Session Equipment Utilized During Treatment: Gait belt;Rolling walker  OT Visit Diagnosis: Unsteadiness on feet (R26.81);Muscle weakness (generalized) (M62.81)   Activity Tolerance Patient tolerated treatment well   Patient Left in bed;with call bell/phone within reach;with bed alarm set;with family/visitor present(husband-Clyde present in room throughout session)   Nurse Communication Mobility status;Other (comment)(notified RN of + orthostatics)        Time: [id number] OT Time Calculation (min): 29 min  Charges: OT General Charges $OT Visit: 1 Visit OT Treatments $Self Care/Home Management : 8-22 mins  Gerrianne Scale, [place], OTR/L ascom [phone] 10/14/19, 3:34 PM

## 2019-10-14 NOTE — Plan of Care (Signed)

## 2019-10-14 NOTE — Progress Notes (Signed)
Inpatient Diabetes Program Recommendations  AACE/ADA: New Consensus Statement on Inpatient Glycemic Control (2015)  Target Ranges:  Prepandial:   less than 140 mg/dL      Peak postprandial:   less than 180 mg/dL (1-2 hours)      Critically ill patients:  140 - 180 mg/dL   Lab Results  Component Value Date   GLUCAP 139 (H) 10/14/2019   HGBA1C 8.2 (H) 10/06/2019    Spoke with patient and spouse about new diabetes diagnosis.  Discussed A1C results (8.2%) and explained what an A1C is. Discussed basic pathophysiology of DM Type 2, basic home care, importance of checking CBGs and maintaining good CBG control to prevent long-term and short-term complications. Reviewed glucose and A1C goals. Pt reports severl family members that have DM.  briefly discussed nutrition and lifestyle modifications.  Discussed with pt that she is on long acting insulin to keep her glucose levels at goal. Discussed medication options at time of d/c including insulin based on glucose trends currently on Levemir 8 units bid.  Pt reports not wanting to be on insulin at the time she is to be going home. Discussed with pt that we will try to simplify her regimen and hopefully can use oral DM medications, but wanted her to consider insulin if need be.  Helped pt order lunch tray. PT came to see pt while in room. Pt received Living well with DM booklet from RN while I was in room. Encouraged pt to read booklet in her free time.   Will see pt tomorrow 2/3 to review more information and answer questions patient may have.   Thanks, Tama Headings RN, MSN, BC-ADM Inpatient Diabetes Coordinator Team Pager [phone] (8a-5p)

## 2019-10-14 NOTE — Progress Notes (Signed)
Physical Therapy Treatment Patient Details Name: Cathy Hughes MRN: [id number] DOB: Dec 22, 1960 Today's Date: 10/14/2019    History of Present Illness 59 yo female admitted with headache s/p oxycodone, became minimally responsive after receiving narcotic and benzodiazepine low dose., Intubated 1/25 extubated 1/29. Work up + step pneumonia, streptococcal meningitis.    PT Comments    Pt seen by PT/OT this date. Pt demonstrated progression towards goals; improved ability to maintain sitting balance (FAIR-POOR) intermittently needed CGA-minA with decreased UE propping. Pt able to sit EOB for several minutes. Sit <> stand with minAx2 and RW. The pt was able to side step at bed for 1-23ft. step by step cueing for sequencing and foot placement. 2-3 LOB noted needing modA to correct. Pt also positive for mild orthostatic hypotension due to complaints of dizziness with change of position. Overall the patient would benefit from further skilled PT intervention to return to PLOF as able.     Follow Up Recommendations  CIR     Equipment Recommendations  Rolling walker with 5" wheels;Other (comment);3in1 (PT)    Recommendations for Other Services       Precautions / Restrictions Precautions Precautions: Fall Restrictions Weight Bearing Restrictions: No    Mobility  Bed Mobility Overal bed mobility: Needs Assistance Bed Mobility: Sit to Supine;Supine to Sit;Rolling     Supine to sit: HOB elevated;Min guard     General bed mobility comments: extra time needed and UE support required for successful transfer. Pt eager to perform without physical assist  Transfers Overall transfer level: Needs assistance Equipment used: Rolling walker (2 wheeled) Transfers: Sit to/from Stand Sit to Stand: Min assist;+2 safety/equipment         General transfer comment: MIN verbal cues for hand placement in prep for arm in arm sit<>stand x2  Ambulation/Gait Ambulation/Gait assistance: Min assist;+2  physical assistance Gait Distance (Feet): 2 Feet Assistive device: Rolling walker (2 wheeled)       General Gait Details: able to side step at bed for 1-35ft. step by step cueing for sequencing. 2-3 LOB noted needing modA to correct.   Stairs             Wheelchair Mobility    Modified Rankin (Stroke Patients Only)       Balance Overall balance assessment: Needs assistance Sitting-balance support: Feet unsupported;Bilateral upper extremity supported Sitting balance-Leahy Scale: Fair Sitting balance - Comments: fair-poor sitting balance noted, improved balance with BUE propped                                    Cognition Arousal/Alertness: Awake/alert Behavior During Therapy: WFL for tasks assessed/performed;Anxious Overall Cognitive Status: Within Functional Limits for tasks assessed                                 General Comments: some emotional lability noted this session      Exercises      General Comments        Pertinent Vitals/Pain Pain Assessment: No/denies pain    Home Living                      Prior Function            PT Goals (current goals can now be found in the care plan section) Progress towards PT goals: Progressing toward goals  Frequency    Min 2X/week      PT Plan Current plan remains appropriate    Co-evaluation PT/OT/SLP Co-Evaluation/Treatment: Yes Reason for Co-Treatment: Complexity of the patient's impairments (multi-system involvement);For patient/therapist safety;To address functional/ADL transfers PT goals addressed during session: Mobility/safety with mobility;Proper use of DME;Strengthening/ROM;Balance OT goals addressed during session: ADL's and self-care;Proper use of Adaptive equipment and DME      AM-PAC PT "6 Clicks" Mobility   Outcome Measure  Help needed turning from your back to your side while in a flat bed without using bedrails?: A Lot Help needed moving  from lying on your back to sitting on the side of a flat bed without using bedrails?: A Lot Help needed moving to and from a bed to a chair (including a wheelchair)?: A Lot Help needed standing up from a chair using your arms (e.g., wheelchair or bedside chair)?: A Little Help needed to walk in hospital room?: A Lot Help needed climbing 3-5 steps with a railing? : Total 6 Click Score: 12    End of Session Equipment Utilized During Treatment: Gait belt;Oxygen(2L) Activity Tolerance: Patient tolerated treatment well Patient left: in bed;with call bell/phone within reach;with bed alarm set;with family/visitor present Nurse Communication: Mobility status PT Visit Diagnosis: Other abnormalities of gait and mobility (R26.89);Difficulty in walking, not elsewhere classified (R26.2);Unsteadiness on feet (R26.81)     Time: [id number] PT Time Calculation (min) (ACUTE ONLY): 29 min  Charges:  $Therapeutic Exercise: 8-22 mins                     Lieutenant Diego PT, DPT 1:47 PM,10/14/19

## 2019-10-14 NOTE — TOC Initial Note (Signed)
Transition of Care [facility]) - Initial/Assessment Note    Patient Details  Name: Cathy Hughes MRN: [id number] Date of Birth: 09-03-1961  Transition of Care [facility]) CM/SW Contact:    Su Hilt, RN Phone Number: 10/14/2019, 3:05 PM  Clinical Narrative:                 Met with the patient and the husband at the bedside and her daughter on the phone I explained that PT is recommending that she go to CIR, I explained the difference between CIR and [place] She stated that she is not going anywhere she has to stay I explained that the goal is to get her back at [facility] to where she can care for herself She said she has enough family that can help her that she will be fine I explained that at CIR they do therapy up to 3 hours a day and HH only is there 2-3 times a week max and for 45 min She said she has plenty of family that will help She has no DME at home and will need a RW, BSC, oxygen and a nebulizer, I notified Brad with [employer] of the charity need, she has no insurance, I encouraged her to call the financial counselor back and to apply for Medicaid, I explained it can be applied for online.   I notified [name] at [place] need for [facility] PT, OT, and Aide The patient goes to the [facility] for PCP and she will need help with Medications at DC. I provided her with an application to fill out for meds Will continue to follow til DC for additional needs  Expected Discharge Plan: [place] Barriers to Discharge: Continued Medical Work up   Patient Goals and CMS Choice Patient states their goals for this hospitalization and ongoing recovery are:: go home      Expected Discharge Plan and Services Expected Discharge Plan: [place]   Discharge Planning Services: Medication Assistance, CM Consult   Living arrangements for the past 2 months: Single Family Home                 DME Arranged: 3-N-1, Walker rolling, Oxygen,  Nebulizer/meds DME Agency: [employer] Date DME Agency Contacted: 10/14/19 Time DME Agency Contacted: 1500 Representative spoke with at DME Agency: [place]: PT, OT, Nurse's Aide [place] Agency: [facility] Date [place]: 10/14/19 Time [place]: 1504 Representative spoke with at [place]: [place] Arrangements/Services Living arrangements for the past 2 months: [place] with:: Spouse Patient language and need for interpreter reviewed:: Yes Do you feel safe going back to the place where you live?: Yes      Need for Family Participation in Patient Care: No (Comment) Care giver support system in place?: Yes (comment)   Criminal Activity/Legal Involvement Pertinent to Current Situation/Hospitalization: No - Comment as needed  Activities of Daily Living      Permission Sought/Granted   Permission granted to share information with : Yes, Verbal Permission Granted              Emotional Assessment Appearance:: Appears stated age Attitude/Demeanor/Rapport: Engaged Affect (typically observed): Appropriate Orientation: : Oriented to Place, Oriented to Self, Oriented to  Time, Oriented to Situation Alcohol / Substance Use: Not Applicable Psych Involvement: No (comment)  Admission diagnosis:  Altered mental status [R41.82] Pain [R52] Sepsis ([place]) [A41.9] Altered mental status, unspecified  altered mental status type [R41.82] Sepsis, due to unspecified organism, unspecified whether acute organ dysfunction present [facility]) [A41.9] Patient Active Problem List   Diagnosis Date Noted  . Bacteremia due to Streptococcus 10/13/2019  . 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 Perennial allergic rhinitis 10/06/2019  . Tobacco abuse 10/06/2019  . Sepsis ([place]) 10/05/2019  . Acute metabolic encephalopathy 98/28/6751  . Hyperglycemia due to type 2 diabetes mellitus ([place]) 10/05/2019  . Left ear pain 10/05/2019  .  COPD (chronic obstructive pulmonary disease) ([place]) 07/31/2018  . Class 1 obesity due to excess calories with serious comorbidity and body mass index (BMI) of 34.0 to 34.9 in adult 07/31/2018  . HTN (hypertension) 12/09/2013   PCP:  Donnie Coffin, MD Pharmacy:   [facility] [place], [place] [address] [place] [address] [facility] [address] [place] [address] Phone: [phone] Fax: [phone]  [facility] [place], [place] [address] Phone: [phone] Fax: [phone]     Social Determinants of Health (SDOH) Interventions    Readmission Risk Interventions No flowsheet data found.

## 2019-10-14 NOTE — Progress Notes (Signed)
PROGRESS NOTE    Cathy Hughes  WPV:[id number] DOB: December 05, 1960 DOA: 10/05/2019  PCP: Donnie Coffin, MD    LOS - 8   Brief Narrative:  59 yo female admitted after presented with persistent left ear pain, developed AMS while in the ED, suspicious for meningitis which was later confirmed on LP.  Patient with pneumococcal meningitis and bacteremia.  Subsequently progressed to acute respiratory failure secondary to metabolic acidosis, acute toxic metabolic encephalopathy, and sepsis etiology secondary to meningitis requiring mechanical intubation. Extubated 1/29.  Stable and transferred to [facility] service as of 2/1 and downgraded to med/surg status.  Subjective 2/2: Patient seen at bedside this morning.  No acute events reported overnight.  She reports dizziness when sitting up.  Reports a headache earlier this morning but improved with Tylenol.  States not feeling weak on the right today.  Denies fevers or chills, chest pain or shortness of breath, nausea vomiting diarrhea or other acute complaints.  Assessment & Plan:   Principal Problem:   Sepsis ([place]) Active Problems:   Bacteremia due to Streptococcus   Meningitis due to Streptococcus pneumoniae   HTN (hypertension)   COPD (chronic obstructive pulmonary disease) (HCC)   Acute metabolic encephalopathy   Hyperglycemia due to type 2 diabetes mellitus (HCC)   Left ear pain   Perennial allergic rhinitis   Tobacco abuse  Sepsis secondary to strep pneumoniae bacteremia and meningitis Initial blood cultures from 1/25 grew strep pneumo.  Repeats of 1/26 negative. --ID, neuro following --Continue Rocephin --TEE scheduled for tomorrow to rule out endocarditis  Right-sided weakness -appears to be resolved no weakness appreciated on exam today.   Unknown time of onset, would not be candidate for TPA. Unclear if this is due to meningitis itself versus acute thrombosis or CVA/TIA. CTA head and CT venogram negative. --Neurology  following  Acute metabolic encephalopathy -secondary to meningitis - appears resolved  Hypernatremia -resolved.  Stop D5W.  Expect now that patient is eating and drinking this should not recur. --Recheck BMP in the morning.    Essential hypertension -currently uncontrolled as meds have been held.  Uncontrolled on her home dose of amlodipine 5 mg. --Increase amlodipine to 10 mg daily --Hydralazine as needed  COPD -not acutely exacerbated.  Home regimen is albuterol inhaler prn and duo nebs prn --Continue Pulmicort nebs twice daily --Continue duo nebs every 6 hours  Type 2 diabetes with hyperglycemia -appears not to be on medications for this outpatient. Hemoglobin A1c 8.2% on 10/06/2019.  Newly diagnosed.  Blood glucose well controlled on current regimen. --Levemir 8 units twice daily --Moderate sliding scale NovoLog --Change from every 4 hour CBGs and sliding scale to meals and bedtime  Left ear pain -secondary to meningitis, this resolved.  Monitor.  Perennial allergic rhinitis --Resume home Singulair and Flonase --Ocean nasal spray for congestion  Tobacco abuse --counsel on cessation --will provide nicotine patch if needed  GERD -continue home PPI  Oral thrush -nystatin solution ordered   DVT prophylaxis: Lovenox   Code Status: Full Code  Family Communication: None at bedside Disposition Plan: Patient wants [facility] services.  PT and OT recommended CIR.     Discharge pending further evaluation including TEE and cleared by consultants.  Patient continues to require hospital care with IV antibiotics for bacteremia and meningitis and ongoing evaluation as above.   Consultants:   Neurology  Infectious disease  PCCM  Cardiology  Diabetes coordinator  Procedures:   Intubation  Extubation  Antimicrobials:   Rocephin  Objective: Vitals:   10/13/19 1226 10/13/19 1404 10/13/19 2009 10/14/19 0027  BP: (!) 178/82   (!) 143/66  Pulse: 82   76   Resp:    16  Temp: 98 F (36.7 C)   98.3 F (36.8 C)  TempSrc: Oral   Oral  SpO2: 97% 96% 97% 100%  Weight:      Height:        Intake/Output Summary (Last 24 hours) at 10/14/2019 0749 Last data filed at 10/14/2019 0500 Gross per 24 hour  Intake 2502.25 ml  Output --  Net 2502.25 ml   Filed Weights   10/09/19 0359 10/10/19 0500 10/13/19 0445  Weight: 105.6 kg 106 kg 103 kg    Examination:  General exam: awake, alert, no acute distress, obese HEENT: clear conjunctiva, anicteric sclera, moist mucus membranes, hearing grossly normal  Respiratory system: clear to auscultation bilaterally, no wheezes, rales or rhonchi, normal respiratory effort. Cardiovascular system: normal S1/S2, RRR, no murmurs, no pedal edema.   Central nervous system: alert and oriented x3. no gross focal neurologic deficits, normal speech Extremities: moves all, no edema, normal tone Skin: dry, intact, normal temperature Psychiatry: normal mood, congruent affect    Data Reviewed: I have personally reviewed following labs and imaging studies  CBC: Recent Labs  Lab 10/08/19 0553 10/09/19 0509 10/11/19 0607 10/12/19 0557 10/13/19 0313  WBC 19.6* 17.2* 13.1* 12.6* 12.0*  NEUTROABS 17.6*  --   --  8.7* 8.7*  HGB 10.9* 10.5* 11.9* 12.2 12.0  HCT 34.3* 33.7* 36.3 37.9 37.3  MCV 87.7 88.9 85.4 85.6 85.9  PLT 343 323 362 337 381   Basic Metabolic Panel: Recent Labs  Lab 10/08/19 0553 10/08/19 0553 10/09/19 0509 10/10/19 0606 10/11/19 0607 10/11/19 1953 10/12/19 0557 10/13/19 0313  NA 148*   < > 155* 152* 156*  --  153* 150*  K 3.9   < > 4.5 4.2 2.9* 3.3* 3.2* 3.6  CL 114*   < > 118* 114* 113*  --  111 111  CO2 27   < > 29 30 33*  --  32 30  GLUCOSE 222*   < > 168* 198* 145*  --  101* 129*  BUN 25*   < > 33* 39* 34*  --  26* 18  CREATININE 0.90   < > 0.84 0.75 0.76  --  0.76 0.75  CALCIUM 9.1   < > 9.5 9.3 9.4  --  8.6* 8.8*  MG 3.0*  --   --  3.0* 2.3  --  2.6* 2.7*  PHOS 4.0   < >  --   4.4 2.2* 3.9 3.4 2.2*   < > = values in this interval not displayed.   GFR: Estimated Creatinine Clearance: 93.5 mL/min (by C-G formula based on SCr of 0.75 mg/dL). Liver Function Tests: Recent Labs  Lab 10/08/19 0553  AST 21  ALT 23  ALKPHOS 55  BILITOT 0.5  PROT 7.0  ALBUMIN 2.7*   No results for input(s): LIPASE, AMYLASE in the last 168 hours. No results for input(s): AMMONIA in the last 168 hours. Coagulation Profile: Recent Labs  Lab 10/07/19 0821  INR 1.1   Cardiac Enzymes: No results for input(s): CKTOTAL, CKMB, CKMBINDEX, TROPONINI in the last 168 hours. BNP (last 3 results) No results for input(s): PROBNP in the last 8760 hours. HbA1C: No results for input(s): HGBA1C in the last 72 hours. CBG: Recent Labs  Lab 10/12/19 1931 10/13/19 0018 10/13/19 0425 10/13/19 0741 10/13/19 1119  GLUCAP 123* 121* 99 111* 142*   Lipid Profile: No results for input(s): CHOL, HDL, LDLCALC, TRIG, CHOLHDL, LDLDIRECT in the last 72 hours. Thyroid Function Tests: No results for input(s): TSH, T4TOTAL, FREET4, T3FREE, THYROIDAB in the last 72 hours. Anemia Panel: No results for input(s): VITAMINB12, FOLATE, FERRITIN, TIBC, IRON, RETICCTPCT in the last 72 hours. Sepsis Labs: No results for input(s): PROCALCITON, LATICACIDVEN in the last 168 hours.  Recent Results (from the past 240 hour(s))  Culture, blood (routine x 2)     Status: Abnormal   Collection Time: 10/06/19 12:13 AM   Specimen: BLOOD  Result Value Ref Range Status   Specimen Description   Final    BLOOD LEFT HAND Performed at [facility], [address], [place], [place] [address]    Special Requests   Final    BOTTLES DRAWN AEROBIC AND ANAEROBIC Blood Culture results may not be optimal due to an inadequate volume of blood received in culture bottles Performed at [facility], [address], [place], [place] [address]    Culture  Setup Time   Final    GRAM POSITIVE COCCI IN BOTH AEROBIC  AND ANAEROBIC BOTTLES CRITICAL RESULT CALLED TO, READ BACK BY AND VERIFIED WITH: Rito Ehrlich ON 10/06/19 AT 1225 QSD Performed at [facility], [place] [address] [place], [place] [address]    Culture STREPTOCOCCUS PNEUMONIAE (A)  Final   Report Status 10/08/2019 FINAL  Final   Organism ID, Bacteria STREPTOCOCCUS PNEUMONIAE  Final      Susceptibility   Streptococcus pneumoniae - MIC*    ERYTHROMYCIN >=8 RESISTANT Resistant     LEVOFLOXACIN 0.5 SENSITIVE Sensitive     VANCOMYCIN 0.25 SENSITIVE Sensitive     PENICILLIN (non-meningitis) 0.25 SENSITIVE Sensitive     PENICILLIN (oral) 0.25 INTERMEDIATE Intermediate     CEFTRIAXONE (non-meningitis) 0.25 SENSITIVE Sensitive     * STREPTOCOCCUS PNEUMONIAE  Culture, blood (routine x 2)     Status: Abnormal   Collection Time: 10/06/19 12:13 AM   Specimen: BLOOD  Result Value Ref Range Status   Specimen Description   Final    BLOOD RIGHT HAND Performed at [facility], [address] [place], [place] [address]    Special Requests   Final    BOTTLES DRAWN AEROBIC AND ANAEROBIC Blood Culture results may not be optimal due to an inadequate volume of blood received in culture bottles Performed at [facility], [address] [place], [place] [address]    Culture  Setup Time   Final    GRAM POSITIVE COCCI IN BOTH AEROBIC AND ANAEROBIC BOTTLES CRITICAL RESULT CALLED TO, READ BACK BY AND VERIFIED WITH: LISA KLUTTZ ON 10/06/19 AT 1032 QSD Performed at [facility], [address] [place], [place] [address]    Culture (A)  Final    STREPTOCOCCUS PNEUMONIAE SUSCEPTIBILITIES PERFORMED ON PREVIOUS CULTURE WITHIN THE LAST 5 DAYS. Performed at [facility], [place] [address], [place],  [address]    Report Status 10/08/2019 FINAL  Final  Blood Culture ID Panel (Reflexed)     Status: Abnormal   Collection Time: 10/06/19 12:13 AM  Result Value Ref Range Status   Enterococcus species NOT DETECTED NOT  DETECTED Final   Listeria monocytogenes NOT DETECTED NOT DETECTED Final   Staphylococcus species NOT DETECTED NOT DETECTED Final   Staphylococcus aureus (BCID) NOT DETECTED NOT DETECTED Final   Streptococcus species DETECTED (A) NOT DETECTED Final    Comment: CRITICAL  RESULT CALLED TO, READ BACK BY AND VERIFIED WITH: WALID NAZARI ON 10/06/19 AT 1225 QSD    Streptococcus agalactiae NOT DETECTED NOT DETECTED Final   Streptococcus pneumoniae DETECTED (A) NOT DETECTED Final    Comment: CRITICAL RESULT CALLED TO, READ BACK BY AND VERIFIED WITH: WALID NAZARI ON 10/06/19 AT 1225 QSD    Streptococcus pyogenes NOT DETECTED NOT DETECTED Final   Acinetobacter baumannii NOT DETECTED NOT DETECTED Final   Enterobacteriaceae species NOT DETECTED NOT DETECTED Final   Enterobacter cloacae complex NOT DETECTED NOT DETECTED Final   Escherichia coli NOT DETECTED NOT DETECTED Final   Klebsiella oxytoca NOT DETECTED NOT DETECTED Final   Klebsiella pneumoniae NOT DETECTED NOT DETECTED Final   Proteus species NOT DETECTED NOT DETECTED Final   Serratia marcescens NOT DETECTED NOT DETECTED Final   Haemophilus influenzae NOT DETECTED NOT DETECTED Final   Neisseria meningitidis NOT DETECTED NOT DETECTED Final   Pseudomonas aeruginosa NOT DETECTED NOT DETECTED Final   Candida albicans NOT DETECTED NOT DETECTED Final   Candida glabrata NOT DETECTED NOT DETECTED Final   Candida krusei NOT DETECTED NOT DETECTED Final   Candida parapsilosis NOT DETECTED NOT DETECTED Final   Candida tropicalis NOT DETECTED NOT DETECTED Final    Comment: Performed at [facility], [address] [place], [place] [address]  Urine Culture     Status: None   Collection Time: 10/06/19 12:51 AM   Specimen: Urine, Random  Result Value Ref Range Status   Specimen Description   Final    URINE, RANDOM Performed at [facility], [address] [place], [place] [address]    Special Requests   Final    NONE Performed  at [facility], [address], [place], [place] [address]    Culture   Final    NO GROWTH Performed at [facility], [place] [address], [place], [place] [address]    Report Status 10/07/2019 FINAL  Final  SARS CORONAVIRUS 2 (TAT 6-24 HRS) Nasopharyngeal Nasopharyngeal Swab     Status: None   Collection Time: 10/06/19  2:30 AM   Specimen: Nasopharyngeal Swab  Result Value Ref Range Status   SARS Coronavirus 2 NEGATIVE NEGATIVE Final    Comment: (NOTE) SARS-CoV-2 target nucleic acids are NOT DETECTED. The SARS-CoV-2 RNA is generally detectable in upper and lower respiratory specimens during the acute phase of infection. Negative results do not preclude SARS-CoV-2 infection, do not rule out co-infections with other pathogens, and should not be used as the sole basis for treatment or other patient management decisions. Negative results must be combined with clinical observations, patient history, and epidemiological information. The expected result is Negative. Fact Sheet for Patients: SugarRoll.be Fact Sheet for Healthcare Providers: https://www.woods-mathews.com/ This test is not yet approved or cleared by the [place] FDA and  has been authorized for detection and/or diagnosis of SARS-CoV-2 by FDA under an Emergency Use Authorization (EUA). This EUA will remain  in effect (meaning this test can be used) for the duration of the COVID-19 declaration under Section 56 4(b)(1) of the Act, 21 U.S.C. section 360bbb-3(b)(1), unless the authorization is terminated or revoked sooner. Performed at [facility], [place] [address] [place], [place] [address]   Respiratory Panel by PCR     Status: None   Collection Time: 10/06/19  3:40 AM   Specimen: Nasopharyngeal Swab; Respiratory  Result Value Ref Range Status   Adenovirus NOT DETECTED NOT DETECTED Final   Coronavirus 229E NOT DETECTED  NOT DETECTED Final    Comment:  (NOTE) The Coronavirus on the Respiratory Panel, DOES NOT test for the novel  Coronavirus (2019 nCoV)    Coronavirus HKU1 NOT DETECTED NOT DETECTED Final   Coronavirus NL63 NOT DETECTED NOT DETECTED Final   Coronavirus OC43 NOT DETECTED NOT DETECTED Final   Metapneumovirus NOT DETECTED NOT DETECTED Final   Rhinovirus / Enterovirus NOT DETECTED NOT DETECTED Final   Influenza A NOT DETECTED NOT DETECTED Final   Influenza B NOT DETECTED NOT DETECTED Final   Parainfluenza Virus 1 NOT DETECTED NOT DETECTED Final   Parainfluenza Virus 2 NOT DETECTED NOT DETECTED Final   Parainfluenza Virus 3 NOT DETECTED NOT DETECTED Final   Parainfluenza Virus 4 NOT DETECTED NOT DETECTED Final   Respiratory Syncytial Virus NOT DETECTED NOT DETECTED Final   Bordetella pertussis NOT DETECTED NOT DETECTED Final   Chlamydophila pneumoniae NOT DETECTED NOT DETECTED Final   Mycoplasma pneumoniae NOT DETECTED NOT DETECTED Final    Comment: Performed at [facility], [place] [address] [place], [place] [address]  MRSA PCR Screening     Status: None   Collection Time: 10/06/19  3:41 AM   Specimen: Nasopharyngeal  Result Value Ref Range Status   MRSA by PCR NEGATIVE NEGATIVE Final    Comment:        The GeneXpert MRSA Assay (FDA approved for NASAL specimens only), is one component of a comprehensive MRSA colonization surveillance program. It is not intended to diagnose MRSA infection nor to guide or monitor treatment for MRSA infections. Performed at [facility], [address] [place], [place] [address]   CULTURE, BLOOD (ROUTINE X 2) w Reflex to ID Panel     Status: None   Collection Time: 10/07/19  1:17 AM   Specimen: BLOOD  Result Value Ref Range Status   Specimen Description BLOOD LEFT HAND  Final   Special Requests   Final    BOTTLES DRAWN AEROBIC AND ANAEROBIC Blood Culture adequate volume   Culture   Final    NO GROWTH 5 DAYS Performed at [facility], [address] [place], [place] [address]    Report Status 10/12/2019 FINAL  Final  CULTURE, BLOOD (ROUTINE X 2) w Reflex to ID Panel     Status: None   Collection Time: 10/07/19  1:17 AM   Specimen: BLOOD  Result Value Ref Range Status   Specimen Description BLOOD LEFT WRIST  Final   Special Requests   Final    BOTTLES DRAWN AEROBIC AND ANAEROBIC Blood Culture adequate volume   Culture   Final    NO GROWTH 5 DAYS Performed at [facility], [address], [place], [place] [address]    Report Status 10/12/2019 FINAL  Final  CSF culture     Status: None   Collection Time: 10/07/19  1:22 PM   Specimen: PATH Cytology CSF; Cerebrospinal Fluid  Result Value Ref Range Status   Specimen Description   Final    CSF Performed at [facility], [address], [place], [place] [address]    Special Requests   Final    NONE Performed at [facility], [address] [place], [place] [address]    Gram Stain   Final    NO ORGANISMS SEEN WBC SEEN RED BLOOD CELLS RESULT CALLED TO, READ BACK BY AND VERIFIED WITH: MEGAN SCHEFFIELD 10/07/19 @ 1526  [place] Performed at [facility], [address] [place], [place] [address]  Culture   Final    NO GROWTH 3 DAYS Performed at [facility], [place] [address] [place], [place] [address]    Report Status 10/11/2019 FINAL  Final  Fungus Culture With Stain     Status: None (Preliminary result)   Collection Time: 10/07/19  1:22 PM   Specimen: PATH Cytology CSF; Cerebrospinal Fluid  Result Value Ref Range Status   Fungus Stain Final report  Final    Comment: (NOTE) Performed At: [facility] [place], [place] [id number] Rush Farmer MD SU:[id number]    Fungus (Mycology) Culture PENDING  Incomplete   Fungal Source CSF  Final    Comment: Performed at [facility], [address] [place], [place] [address]  Fungus Culture Result     Status: None   Collection Time: 10/07/19  1:22 PM   Result Value Ref Range Status   Result 1 Comment  Final    Comment: (NOTE) KOH/Calcofluor preparation:  no fungus observed. Performed At: [facility] [place], [place] [id number] Rush Farmer MD RR:[id number]          Radiology Studies: CT ANGIO HEAD W OR WO CONTRAST  Result Date: 10/13/2019 CLINICAL DATA:  New onset right-sided weakness.  Meningitis. EXAM: CT ANGIOGRAPHY HEAD CT VENOGRAM HEAD TECHNIQUE: Multidetector CT imaging of the head was performed using the standard protocol during bolus administration of intravenous contrast. Multiplanar CT image reconstructions and MIPs were obtained to evaluate the vascular anatomy. CONTRAST:  82m OMNIPAQUE IOHEXOL 350 MG/ML SOLN COMPARISON:  Head CTA 10/07/2019 and MRI 10/06/2019 FINDINGS: CT HEAD Brain: No acute large territory infarct, intracranial hemorrhage, mass, midline shift, or extra-axial fluid collection is identified. The ventricles and sulci are normal in size. 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 Vascular: Reported below. Skull: No fracture suspicious osseous lesion. Sinuses: Mild posterior left ethmoid air cell mucosal thickening. Small mastoid effusions. Orbits: Unremarkable. CTA HEAD Anterior circulation: The internal carotid arteries are widely patent from skull base to carotid termini. ACAs and MCAs are patent without evidence of proximal branch occlusion or significant proximal stenosis. No aneurysm is identified. Posterior circulation: The visualized distal vertebral arteries are patent to the basilar with the left being dominant. Patent SCA is are seen bilaterally. The basilar artery is widely patent. There are small posterior communicating arteries bilaterally. Both PCAs are patent without evidence of significant proximal stenosis. No aneurysm is identified. Anatomic variants: None. CTV HEAD The superior sagittal sinus, internal cerebral veins, vein of Galen, straight sinus, right transverse  sinus, and right sigmoid sinus are patent. The left transverse and left sigmoid sinuses are hypoplastic resulting in suboptimal visualization. IMPRESSION: 1. No evidence of acute intracranial abnormality. 2. No major intracranial arterial occlusion or significant stenosis. 3. No evidence of dural venous sinus thrombosis. Electronically Signed   By: ALogan BoresM.D.   On: 10/13/2019 18:04   CT VENOGRAM HEAD  Result Date: 10/13/2019 CLINICAL DATA:  New onset right-sided weakness.  Meningitis. EXAM: CT ANGIOGRAPHY HEAD CT VENOGRAM HEAD TECHNIQUE: Multidetector CT imaging of the head was performed using the standard protocol during bolus administration of intravenous contrast. Multiplanar CT image reconstructions and MIPs were obtained to evaluate the vascular anatomy. CONTRAST:  772mOMNIPAQUE IOHEXOL 350 MG/ML SOLN COMPARISON:  Head CTA 10/07/2019 and MRI 10/06/2019 FINDINGS: CT HEAD Brain: No acute large territory infarct, intracranial hemorrhage, mass, midline shift, or extra-axial fluid collection is identified. The ventricles and sulci are normal in size. A cavum  septum pellucidum et vergae is again noted. Vascular: Reported below. Skull: No fracture suspicious osseous lesion. Sinuses: Mild posterior left ethmoid air cell mucosal thickening. Small mastoid effusions. Orbits: Unremarkable. CTA HEAD Anterior circulation: The internal carotid arteries are widely patent from skull base to carotid termini. ACAs and MCAs are patent without evidence of proximal branch occlusion or significant proximal stenosis. No aneurysm is identified. Posterior circulation: The visualized distal vertebral arteries are patent to the basilar with the left being dominant. Patent SCA is are seen bilaterally. The basilar artery is widely patent. There are small posterior communicating arteries bilaterally. Both PCAs are patent without evidence of significant proximal stenosis. No aneurysm is identified. Anatomic variants: None. CTV HEAD  The superior sagittal sinus, internal cerebral veins, vein of Galen, straight sinus, right transverse sinus, and right sigmoid sinus are patent. The left transverse and left sigmoid sinuses are hypoplastic resulting in suboptimal visualization. IMPRESSION: 1. No evidence of acute intracranial abnormality. 2. No major intracranial arterial occlusion or significant stenosis. 3. No evidence of dural venous sinus thrombosis. Electronically Signed   By: Logan Bores M.D.   On: 10/13/2019 18:04        Scheduled Meds: . amLODipine  5 mg Oral Daily  . budesonide (PULMICORT) nebulizer solution  0.5 mg Nebulization BID  . chlorhexidine gluconate (MEDLINE KIT)  15 mL Mouth Rinse BID  . Chlorhexidine Gluconate Cloth  6 each Topical Q0600  . enoxaparin (LOVENOX) injection  40 mg Subcutaneous Daily  . fluticasone  2 spray Each Nare Daily  . insulin aspart  0-15 Units Subcutaneous TID WC  . insulin detemir  8 Units Subcutaneous BID  . ipratropium-albuterol  3 mL Nebulization Q6H  . montelukast  10 mg Oral Daily  . nystatin  5 mL Oral QID  . pantoprazole  40 mg Oral Daily  . Ensure Max Protein  11 oz Oral BID BM   Continuous Infusions: . sodium chloride Stopped (10/07/19 1106)  . cefTRIAXone (ROCEPHIN)  IV Stopped (10/13/19 2258)  . dextrose 50 mL/hr at 10/14/19 0500  . famotidine (PEPCID) IV Stopped (10/13/19 2341)     LOS: 8 days    Time spent: 30-35 minutes    Ezekiel Slocumb, DO [facility]   If 7PM-7AM, please contact night-coverage [email or web address] 10/14/2019, 7:49 AM

## 2019-10-14 NOTE — Consult Note (Addendum)
PHARMACY CONSULT NOTE - FOLLOW UP  Pharmacy Consult for Electrolyte Monitoring and Replacement   Recent Labs: Potassium (mmol/L)  Date Value  10/14/2019 3.1 (L)  10/14/2019 3.1 (L)   Magnesium (mg/dL)  Date Value  10/14/2019 2.3   Calcium (mg/dL)  Date Value  10/14/2019 8.4 (L)  10/14/2019 8.5 (L)   Albumin (g/dL)  Date Value  10/14/2019 2.9 (L)   Phosphorus (mg/dL)  Date Value  10/14/2019 2.1 (L)   Sodium (mmol/L)  Date Value  10/14/2019 143  10/14/2019 144   Corrected Ca: 9.28 mg/dL  Assessment: Pharmacy has been consulted to manage electrolytes for this 59 year-old female admitted with pneumococcal bacteremia/bacterial meningitis.  Patient has a PMH significant for prediabetes, hypertension, and COPD.  Renal function has improved and at what appears to be her baseline. Patient was extubated 1/29.    Electrolytes Goal:  All electrolytes WNL  20 mmol IV potassium phosphate (this provides 29.4 mEq IV potassium)  Hypernatremia continues to improve on D5 at 50 mL/hr (Na now wnl's)  F/U Electrolytes with AM labs.  Glucose:  BG 142-157 over last 24 hrs.  A1c 8.2.  On D5W IVF at 50 ml/hr Total SSI required over the previous 24 hours: 4 units on Novolog 0-15 units mealtimes and bedtime and Levemir 8 units BID  Bowel Regimen LBM 2/2  No Active bowel Regimen Will continue to monitor.   Lu Duffel, PharmD, BCPS Clinical Pharmacist 10/14/2019 11:08 AM

## 2019-10-14 NOTE — Progress Notes (Signed)
Subjective: Patient reports some headache today that is responsive to Tylenol.  Also reports dizziness when moving around.  Has started working with therapy.  Was noted by therapy on yesterday to have some right sided weakness.    Objective: Current vital signs: BP (!) 142/77 (BP Location: Right Arm)   Pulse 66   Temp 98.4 F (36.9 C) (Oral)   Resp 16   Ht '5\' 5"'$  (1.651 m)   Wt 103 kg   LMP 09/30/2014   SpO2 97%   BMI 37.79 kg/m  Vital signs in last 24 hours: Temp:  [98 F (36.7 C)-98.4 F (36.9 C)] 98.4 F (36.9 C) (02/02 0812) Pulse Rate:  [66-92] 66 (02/02 0812) Resp:  [14-16] 16 (02/02 0812) BP: (142-178)/(66-82) 142/77 (02/02 0812) SpO2:  [95 %-100 %] 97 % (02/02 0812)  Intake/Output from previous day: 02/01 0701 - 02/02 0700 In: 2502.3 [I.V.:1371.2; IV Piggyback:1131.1] Out: 100 [Stool:100] Intake/Output this shift: No intake/output data recorded. Nutritional status:  Diet Order            Diet NPO time specified Except for: Sips with Meds  Diet effective midnight        DIET DYS 2 Room service appropriate? Yes with Assist; Fluid consistency: Thin  Diet effective now              Neurologic Exam: Mental Status: Alert, oriented, thought content appropriate.  Speech fluent without evidence of aphasia.  Requires some reinforcement for 3-step commands.  Cranial Nerves: II: Visual fields grossly normal, pupils equal, round, reactive to light and accommodation III,IV, VI: ptosis not present, extra-ocular motions intact bilaterally V,VII: decreased right NLF, facial light touch sensation normal bilaterally VIII: hearing normal bilaterally IX,X: gag reflex present XI: bilateral shoulder shrug XII: midline tongue extension Motor: Some mild generalized weakness noted.  With upper extremity pronator drift testing patient noted to have right drift without pronation.  5-/5 hand grip on the right.  Lower extremities with symmetric strength.   Sensory: Pinprick and  light touch intact throughout, bilaterally Deep Tendon Reflexes: Symmetric throughout Cerebellar: Dysmetria with right finger-to-nose testing.  Normal heel-to-shin testing bilaterally Gait: not tested due to safety concerns   Lab Results: Basic Metabolic Panel: Recent Labs  Lab 10/08/19 0553 10/08/19 0553 10/09/19 0509 10/09/19 0509 10/10/19 0606 10/10/19 0606 10/11/19 6578 10/11/19 1953 10/12/19 0557 10/13/19 0313  NA 148*   < > 155*  --  152*  --  156*  --  153* 150*  K 3.9   < > 4.5   < > 4.2  --  2.9* 3.3* 3.2* 3.6  CL 114*   < > 118*  --  114*  --  113*  --  111 111  CO2 27   < > 29  --  30  --  33*  --  32 30  GLUCOSE 222*   < > 168*  --  198*  --  145*  --  101* 129*  BUN 25*   < > 33*  --  39*  --  34*  --  26* 18  CREATININE 0.90   < > 0.84  --  0.75  --  0.76  --  0.76 0.75  CALCIUM 9.1   < > 9.5   < > 9.3   < > 9.4  --  8.6* 8.8*  MG 3.0*  --   --   --  3.0*  --  2.3  --  2.6* 2.7*  PHOS 4.0   < >  --   --  4.4  --  2.2* 3.9 3.4 2.2*   < > = values in this interval not displayed.    Liver Function Tests: Recent Labs  Lab 10/08/19 0553  AST 21  ALT 23  ALKPHOS 55  BILITOT 0.5  PROT 7.0  ALBUMIN 2.7*   No results for input(s): LIPASE, AMYLASE in the last 168 hours. No results for input(s): AMMONIA in the last 168 hours.  CBC: Recent Labs  Lab 10/08/19 0553 10/09/19 0509 10/11/19 0607 10/12/19 0557 10/13/19 0313  WBC 19.6* 17.2* 13.1* 12.6* 12.0*  NEUTROABS 17.6*  --   --  8.7* 8.7*  HGB 10.9* 10.5* 11.9* 12.2 12.0  HCT 34.3* 33.7* 36.3 37.9 37.3  MCV 87.7 88.9 85.4 85.6 85.9  PLT 343 323 362 337 245    Cardiac Enzymes: No results for input(s): CKTOTAL, CKMB, CKMBINDEX, TROPONINI in the last 168 hours.  Lipid Panel: Recent Labs  Lab 10/09/19 0509  TRIG 157*    CBG: Recent Labs  Lab 10/12/19 1931 10/13/19 0018 10/13/19 0425 10/13/19 0741 10/13/19 1119  GLUCAP 123* 121* 99 111* 142*    Microbiology: Results for orders placed  or performed during the hospital encounter of 10/05/19  Culture, blood (routine x 2)     Status: Abnormal   Collection Time: 10/06/19 12:13 AM   Specimen: BLOOD  Result Value Ref Range Status   Specimen Description   Final    BLOOD LEFT HAND Performed at [facility], [address], [place], [place] [address]    Special Requests   Final    BOTTLES DRAWN AEROBIC AND ANAEROBIC Blood Culture results may not be optimal due to an inadequate volume of blood received in culture bottles Performed at [facility], [address], [place], [place] [address]    Culture  Setup Time   Final    GRAM POSITIVE COCCI IN BOTH AEROBIC AND ANAEROBIC BOTTLES CRITICAL RESULT CALLED TO, READ BACK BY AND VERIFIED WITH: Rito Ehrlich ON 10/06/19 AT 1225 QSD Performed at [facility], [place] [address], [place], [place] [address]    Culture STREPTOCOCCUS PNEUMONIAE (A)  Final   Report Status 10/08/2019 FINAL  Final   Organism ID, Bacteria STREPTOCOCCUS PNEUMONIAE  Final      Susceptibility   Streptococcus pneumoniae - MIC*    ERYTHROMYCIN >=8 RESISTANT Resistant     LEVOFLOXACIN 0.5 SENSITIVE Sensitive     VANCOMYCIN 0.25 SENSITIVE Sensitive     PENICILLIN (non-meningitis) 0.25 SENSITIVE Sensitive     PENICILLIN (oral) 0.25 INTERMEDIATE Intermediate     CEFTRIAXONE (non-meningitis) 0.25 SENSITIVE Sensitive     * STREPTOCOCCUS PNEUMONIAE  Culture, blood (routine x 2)     Status: Abnormal   Collection Time: 10/06/19 12:13 AM   Specimen: BLOOD  Result Value Ref Range Status   Specimen Description   Final    BLOOD RIGHT HAND Performed at [facility], [address], [place], [place] [address]    Special Requests   Final    BOTTLES DRAWN AEROBIC AND ANAEROBIC Blood Culture results may not be optimal due to an inadequate volume of blood received in culture bottles Performed at [facility], [address] [place], [place] [address]    Culture  Setup Time   Final     GRAM POSITIVE COCCI IN BOTH AEROBIC AND ANAEROBIC BOTTLES CRITICAL RESULT CALLED TO, READ BACK BY AND VERIFIED WITH: LISA KLUTTZ ON 10/06/19 AT 1032 QSD Performed at [facility], [place]  [address] [place], [place] [address]    Culture (A)  Final    STREPTOCOCCUS PNEUMONIAE SUSCEPTIBILITIES PERFORMED ON PREVIOUS CULTURE WITHIN THE LAST 5 DAYS. Performed at [facility], [place] [address], [place], [place] [address]    Report Status 10/08/2019 FINAL  Final  Blood Culture ID Panel (Reflexed)     Status: Abnormal   Collection Time: 10/06/19 12:13 AM  Result Value Ref Range Status   Enterococcus species NOT DETECTED NOT DETECTED Final   Listeria monocytogenes NOT DETECTED NOT DETECTED Final   Staphylococcus species NOT DETECTED NOT DETECTED Final   Staphylococcus aureus (BCID) NOT DETECTED NOT DETECTED Final   Streptococcus species DETECTED (A) NOT DETECTED Final    Comment: CRITICAL RESULT CALLED TO, READ BACK BY AND VERIFIED WITH: WALID NAZARI ON 10/06/19 AT 1225 QSD    Streptococcus agalactiae NOT DETECTED NOT DETECTED Final   Streptococcus pneumoniae DETECTED (A) NOT DETECTED Final    Comment: CRITICAL RESULT CALLED TO, READ BACK BY AND VERIFIED WITH: WALID NAZARI ON 10/06/19 AT 1225 QSD    Streptococcus pyogenes NOT DETECTED NOT DETECTED Final   Acinetobacter baumannii NOT DETECTED NOT DETECTED Final   Enterobacteriaceae species NOT DETECTED NOT DETECTED Final   Enterobacter cloacae complex NOT DETECTED NOT DETECTED Final   Escherichia coli NOT DETECTED NOT DETECTED Final   Klebsiella oxytoca NOT DETECTED NOT DETECTED Final   Klebsiella pneumoniae NOT DETECTED NOT DETECTED Final   Proteus species NOT DETECTED NOT DETECTED Final   Serratia marcescens NOT DETECTED NOT DETECTED Final   Haemophilus influenzae NOT DETECTED NOT DETECTED Final   Neisseria meningitidis NOT DETECTED NOT DETECTED Final   Pseudomonas aeruginosa NOT DETECTED NOT DETECTED Final   Candida  albicans NOT DETECTED NOT DETECTED Final   Candida glabrata NOT DETECTED NOT DETECTED Final   Candida krusei NOT DETECTED NOT DETECTED Final   Candida parapsilosis NOT DETECTED NOT DETECTED Final   Candida tropicalis NOT DETECTED NOT DETECTED Final    Comment: Performed at [facility], [address], [place], [place] [address]  Urine Culture     Status: None   Collection Time: 10/06/19 12:51 AM   Specimen: Urine, Random  Result Value Ref Range Status   Specimen Description   Final    URINE, RANDOM Performed at [facility], [address] [place], [place] [address]    Special Requests   Final    NONE Performed at [facility], [address] [place], [place] [address]    Culture   Final    NO GROWTH Performed at [facility], [place] [address] [place],  [address]    Report Status 10/07/2019 FINAL  Final  SARS CORONAVIRUS 2 (TAT 6-24 HRS) Nasopharyngeal Nasopharyngeal Swab     Status: None   Collection Time: 10/06/19  2:30 AM   Specimen: Nasopharyngeal Swab  Result Value Ref Range Status   SARS Coronavirus 2 NEGATIVE NEGATIVE Final    Comment: (NOTE) SARS-CoV-2 target nucleic acids are NOT DETECTED. The SARS-CoV-2 RNA is generally detectable in upper and lower respiratory specimens during the acute phase of infection. Negative results do not preclude SARS-CoV-2 infection, do not rule out co-infections with other pathogens, and should not be used as the sole basis for treatment or other patient management decisions. Negative results must be combined with clinical observations, patient history, and epidemiological information. The expected result is Negative. Fact Sheet for Patients: SugarRoll.be Fact Sheet for Healthcare Providers: https://www.woods-mathews.com/ This test is not yet  approved or cleared by the [place] and  has been authorized for detection and/or diagnosis of  SARS-CoV-2 by FDA under an Emergency Use Authorization (EUA). This EUA will remain  in effect (meaning this test can be used) for the duration of the COVID-19 declaration under Section 56 4(b)(1) of the Act, 21 U.S.C. section 360bbb-3(b)(1), unless the authorization is terminated or revoked sooner. Performed at [facility], [place] [address] [place], [place] [address]   Respiratory Panel by PCR     Status: None   Collection Time: 10/06/19  3:40 AM   Specimen: Nasopharyngeal Swab; Respiratory  Result Value Ref Range Status   Adenovirus NOT DETECTED NOT DETECTED Final   Coronavirus 229E NOT DETECTED NOT DETECTED Final    Comment: (NOTE) The Coronavirus on the Respiratory Panel, DOES NOT test for the novel  Coronavirus (2019 nCoV)    Coronavirus HKU1 NOT DETECTED NOT DETECTED Final   Coronavirus NL63 NOT DETECTED NOT DETECTED Final   Coronavirus OC43 NOT DETECTED NOT DETECTED Final   Metapneumovirus NOT DETECTED NOT DETECTED Final   Rhinovirus / Enterovirus NOT DETECTED NOT DETECTED Final   Influenza A NOT DETECTED NOT DETECTED Final   Influenza B NOT DETECTED NOT DETECTED Final   Parainfluenza Virus 1 NOT DETECTED NOT DETECTED Final   Parainfluenza Virus 2 NOT DETECTED NOT DETECTED Final   Parainfluenza Virus 3 NOT DETECTED NOT DETECTED Final   Parainfluenza Virus 4 NOT DETECTED NOT DETECTED Final   Respiratory Syncytial Virus NOT DETECTED NOT DETECTED Final   Bordetella pertussis NOT DETECTED NOT DETECTED Final   Chlamydophila pneumoniae NOT DETECTED NOT DETECTED Final   Mycoplasma pneumoniae NOT DETECTED NOT DETECTED Final    Comment: Performed at [facility], [place]. [address] [place], [place] [address]  MRSA PCR Screening     Status: None   Collection Time: 10/06/19  3:41 AM   Specimen: Nasopharyngeal  Result Value Ref Range Status   MRSA by PCR NEGATIVE NEGATIVE Final    Comment:        The GeneXpert MRSA Assay (FDA approved for NASAL specimens only), is one  component of a comprehensive MRSA colonization surveillance program. It is not intended to diagnose MRSA infection nor to guide or monitor treatment for MRSA infections. Performed at [facility], [address] [place], [place] [address]   CULTURE, BLOOD (ROUTINE X 2) w Reflex to ID Panel     Status: None   Collection Time: 10/07/19  1:17 AM   Specimen: BLOOD  Result Value Ref Range Status   Specimen Description BLOOD LEFT HAND  Final   Special Requests   Final    BOTTLES DRAWN AEROBIC AND ANAEROBIC Blood Culture adequate volume   Culture   Final    NO GROWTH 5 DAYS Performed at [facility], [address] [address] [place], [place] [address]    Report Status 10/12/2019 FINAL  Final  CULTURE, BLOOD (ROUTINE X 2) w Reflex to ID Panel     Status: None   Collection Time: 10/07/19  1:17 AM   Specimen: BLOOD  Result Value Ref Range Status   Specimen Description BLOOD LEFT WRIST  Final   Special Requests   Final    BOTTLES DRAWN AEROBIC AND ANAEROBIC Blood Culture adequate volume   Culture   Final    NO GROWTH 5 DAYS Performed at [facility], [address], [place], [place] [address]    Report Status 10/12/2019 FINAL  Final  CSF culture     Status: None   Collection Time: 10/07/19  1:22 PM   Specimen: PATH Cytology CSF; Cerebrospinal Fluid  Result Value Ref Range Status   Specimen Description   Final    CSF Performed at [facility], [address] [place], [place] [address]    Special Requests   Final    NONE Performed at [facility], [address] [place], [place] [address]    Gram Stain   Final    NO ORGANISMS SEEN WBC SEEN RED BLOOD CELLS RESULT CALLED TO, READ BACK BY AND VERIFIED WITH: MEGAN SCHEFFIELD 10/07/19 @ 1526  [place] Performed at [facility], [address] [place], [place] [address]    Culture   Final    NO GROWTH 3 DAYS Performed at [facility], [place] [address], [place], [place]  [address]    Report Status 10/11/2019 FINAL  Final  Fungus Culture With Stain     Status: None (Preliminary result)   Collection Time: 10/07/19  1:22 PM   Specimen: PATH Cytology CSF; Cerebrospinal Fluid  Result Value Ref Range Status   Fungus Stain Final report  Final    Comment: (NOTE) Performed At: [facility], [place] [id number] [place] MD QI:[id number]    Fungus (Mycology) Culture PENDING  Incomplete   Fungal Source CSF  Final    Comment: Performed at [facility], [address] [place],  [address]  Fungus Culture Result     Status: None   Collection Time: 10/07/19  1:22 PM  Result Value Ref Range Status   Result 1 Comment  Final    Comment: (NOTE) KOH/Calcofluor preparation:  no fungus observed. Performed At: [facility] [place], [place] [id number] [place] MD YQ:[id number]     Coagulation Studies: No results for input(s): LABPROT, INR in the last 72 hours.  Imaging: CT ANGIO HEAD W OR WO CONTRAST  Result Date: 10/13/2019 CLINICAL DATA:  New onset right-sided weakness.  Meningitis. EXAM: CT ANGIOGRAPHY HEAD CT VENOGRAM HEAD TECHNIQUE: Multidetector CT imaging of the head was performed using the standard protocol during bolus administration of intravenous contrast. Multiplanar CT image reconstructions and MIPs were obtained to evaluate the vascular anatomy. CONTRAST:  60m OMNIPAQUE IOHEXOL 350 MG/ML SOLN COMPARISON:  Head CTA 10/07/2019 and MRI 10/06/2019 FINDINGS: CT HEAD Brain: No acute large territory infarct, intracranial hemorrhage, mass, midline shift, or extra-axial fluid collection is identified. The ventricles and sulci are normal in size. 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 Vascular: Reported below. Skull: No fracture suspicious osseous lesion. Sinuses: Mild posterior left ethmoid air cell mucosal thickening. Small mastoid effusions. Orbits: Unremarkable. CTA HEAD  Anterior circulation: The internal carotid arteries are widely patent from skull base to carotid termini. ACAs and MCAs are patent without evidence of proximal branch occlusion or significant proximal stenosis. No aneurysm is identified. Posterior circulation: The visualized distal vertebral arteries are patent to the basilar with the left being dominant. Patent SCA is are seen bilaterally. The basilar artery is widely patent. There are small posterior communicating arteries bilaterally. Both PCAs are patent without evidence of significant proximal stenosis. No aneurysm is identified. Anatomic variants: None. CTV HEAD The superior sagittal sinus, internal cerebral veins, vein of Galen, straight sinus, right transverse sinus, and right sigmoid sinus are patent. The left transverse and left sigmoid sinuses are hypoplastic resulting in suboptimal visualization. IMPRESSION: 1. No evidence of acute intracranial  abnormality. 2. No major intracranial arterial occlusion or significant stenosis. 3. No evidence of dural venous sinus thrombosis. Electronically Signed   By: Logan Bores M.D.   On: 10/13/2019 18:04   CT VENOGRAM HEAD  Result Date: 10/13/2019 CLINICAL DATA:  New onset right-sided weakness.  Meningitis. EXAM: CT ANGIOGRAPHY HEAD CT VENOGRAM HEAD TECHNIQUE: Multidetector CT imaging of the head was performed using the standard protocol during bolus administration of intravenous contrast. Multiplanar CT image reconstructions and MIPs were obtained to evaluate the vascular anatomy. CONTRAST:  74m OMNIPAQUE IOHEXOL 350 MG/ML SOLN COMPARISON:  Head CTA 10/07/2019 and MRI 10/06/2019 FINDINGS: CT HEAD Brain: No acute large territory infarct, intracranial hemorrhage, mass, midline shift, or extra-axial fluid collection is identified. The ventricles and sulci are normal in size. 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 Vascular: Reported below. Skull: No fracture suspicious osseous lesion. Sinuses: Mild  posterior left ethmoid air cell mucosal thickening. Small mastoid effusions. Orbits: Unremarkable. CTA HEAD Anterior circulation: The internal carotid arteries are widely patent from skull base to carotid termini. ACAs and MCAs are patent without evidence of proximal branch occlusion or significant proximal stenosis. No aneurysm is identified. Posterior circulation: The visualized distal vertebral arteries are patent to the basilar with the left being dominant. Patent SCA is are seen bilaterally. The basilar artery is widely patent. There are small posterior communicating arteries bilaterally. Both PCAs are patent without evidence of significant proximal stenosis. No aneurysm is identified. Anatomic variants: None. CTV HEAD The superior sagittal sinus, internal cerebral veins, vein of Galen, straight sinus, right transverse sinus, and right sigmoid sinus are patent. The left transverse and left sigmoid sinuses are hypoplastic resulting in suboptimal visualization. IMPRESSION: 1. No evidence of acute intracranial abnormality. 2. No major intracranial arterial occlusion or significant stenosis. 3. No evidence of dural venous sinus thrombosis. Electronically Signed   By: ALogan BoresM.D.   On: 10/13/2019 18:04    Medications:  I have reviewed the patient's current medications. Scheduled: . amLODipine  5 mg Oral Daily  . budesonide (PULMICORT) nebulizer solution  0.5 mg Nebulization BID  . chlorhexidine gluconate (MEDLINE KIT)  15 mL Mouth Rinse BID  . Chlorhexidine Gluconate Cloth  6 each Topical Q0600  . enoxaparin (LOVENOX) injection  40 mg Subcutaneous Daily  . fluticasone  2 spray Each Nare Daily  . insulin aspart  0-15 Units Subcutaneous TID WC  . insulin detemir  8 Units Subcutaneous BID  . ipratropium-albuterol  3 mL Nebulization Q6H  . montelukast  10 mg Oral Daily  . nystatin  5 mL Oral QID  . pantoprazole  40 mg Oral Daily  . 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  Initially required intubation and sedation.  Maintained on Ceftriaxone, day #9.  Noted with therapy on yesterday to have some right sided weakness.  Today weakness very minimal but some RUE dysmetria noted.  Repeat imaging on yesterday included CT of head and CTA/CTV.  No evidence of abscess or other focal lesion and no noted arterial or venous thrombosis.  Patient continues to improve clinically.    Recommendations: 1. Patient to have follow up MRI.  She reports that she is very claustrophobic and would prefer not to have a MRI.  She is scheduled for TEE tomorrow and may be able to coordinate MRI after that time when she is still under the influence of some sedation.  Patient  is agreeable to this plan.   2. Continue therapy 3. Continue with ID recommendations.     LOS: 8 days   Alexis Goodell, MD Neurology [phone] 10/14/2019  9:19 AM

## 2019-10-15 ENCOUNTER — Inpatient Hospital Stay: Payer: Self-pay

## 2019-10-15 ENCOUNTER — Encounter: Payer: Self-pay | Admitting: Internal Medicine

## 2019-10-15 ENCOUNTER — Encounter
Admission: EM | Disposition: A | Discharge status: Home Health | Payer: Self-pay | Source: Home / Self Care | Attending: Internal Medicine

## 2019-10-15 ENCOUNTER — Inpatient Hospital Stay (HOSPITAL_COMMUNITY)
Admit: 2019-10-15 | Discharge: 2019-10-15 | Disposition: A | Discharge status: Home / Self Care | Payer: Self-pay | Attending: Physician Assistant | Admitting: Physician Assistant

## 2019-10-15 DIAGNOSIS — R531 Weakness: Secondary | ICD-10-CM

## 2019-10-15 DIAGNOSIS — A403 Sepsis due to Streptococcus pneumoniae: Principal | ICD-10-CM

## 2019-10-15 DIAGNOSIS — R7881 Bacteremia: Secondary | ICD-10-CM

## 2019-10-15 HISTORY — PX: TEE WITHOUT CARDIOVERSION: SHX5443

## 2019-10-15 LAB — BASIC METABOLIC PANEL
Anion gap: 11 (ref 5–15)
BUN: 10 mg/dL (ref 6–20)
CO2: 24 mmol/L (ref 22–32)
Calcium: 8.6 mg/dL — ABNORMAL LOW (ref 8.9–10.3)
Chloride: 108 mmol/L (ref 98–111)
Creatinine, Ser: 0.76 mg/dL (ref 0.44–1.00)
GFR calc Af Amer: >60 mL/min (ref >60)
GFR calc non Af Amer: >60 mL/min (ref >60)
Glucose, Bld: 126 mg/dL — ABNORMAL HIGH (ref 70–99)
Potassium: 3.4 mmol/L — ABNORMAL LOW (ref 3.5–5.1)
Sodium: 143 mmol/L (ref 135–145)

## 2019-10-15 LAB — CBC WITH DIFFERENTIAL/PLATELET
Abs Immature Granulocytes: 0.08 10*3/uL — ABNORMAL HIGH (ref 0.00–0.07)
Basophils Absolute: 0 10*3/uL (ref 0.0–0.1)
Basophils Relative: 0 %
Eosinophils Absolute: 0.2 10*3/uL (ref 0.0–0.5)
Eosinophils Relative: 2 %
HCT: 37.9 % (ref 36.0–46.0)
Hemoglobin: 12.1 g/dL (ref 12.0–15.0)
Immature Granulocytes: 1 %
Lymphocytes Relative: 24 %
Lymphs Abs: 2.6 10*3/uL (ref 0.7–4.0)
MCH: 27.1 pg (ref 26.0–34.0)
MCHC: 31.9 g/dL (ref 30.0–36.0)
MCV: 84.8 fL (ref 80.0–100.0)
Monocytes Absolute: 0.7 10*3/uL (ref 0.1–1.0)
Monocytes Relative: 6 %
Neutro Abs: 7.1 10*3/uL (ref 1.7–7.7)
Neutrophils Relative %: 67 %
Platelets: 311 10*3/uL (ref 150–400)
RBC: 4.47 MIL/uL (ref 3.87–5.11)
RDW: 15.5 % (ref 11.5–15.5)
WBC: 10.7 10*3/uL — ABNORMAL HIGH (ref 4.0–10.5)
nRBC: 0 % (ref 0.0–0.2)

## 2019-10-15 LAB — GLUCOSE, CAPILLARY
Glucose-Capillary: 103 mg/dL — ABNORMAL HIGH (ref 70–99)
Glucose-Capillary: 117 mg/dL — ABNORMAL HIGH (ref 70–99)
Glucose-Capillary: 119 mg/dL — ABNORMAL HIGH (ref 70–99)
Glucose-Capillary: 130 mg/dL — ABNORMAL HIGH (ref 70–99)
Glucose-Capillary: 136 mg/dL — ABNORMAL HIGH (ref 70–99)
Glucose-Capillary: 57 mg/dL — ABNORMAL LOW (ref 70–99)

## 2019-10-15 LAB — PHOSPHORUS: Phosphorus: 2.6 mg/dL (ref 2.5–4.6)

## 2019-10-15 LAB — MAGNESIUM: Magnesium: 2.5 mg/dL — ABNORMAL HIGH (ref 1.7–2.4)

## 2019-10-15 IMAGING — MR MR HEAD W/O CM
9 of 10 series · 36 of 48 positions shown · non-contrast
Comparison: CT head [DATE]

CLINICAL DATA: Bacterial meningitis

EXAM:
MRI HEAD WITHOUT CONTRAST
TECHNIQUE: Multiplanar, multiecho pulse sequences of the brain and surrounding
structures were obtained without intravenous contrast.

[Series 7: cor dwi_tracew · coronal · 5.0mm · 0.60mm/px · 6 of 38 slices shown]
[im 1/38]
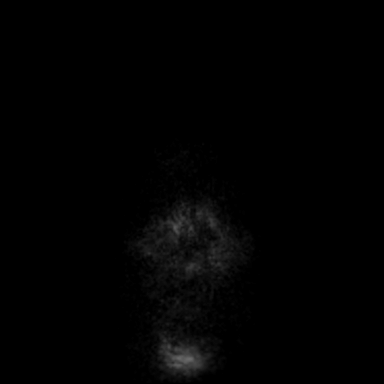
[im 8/38]
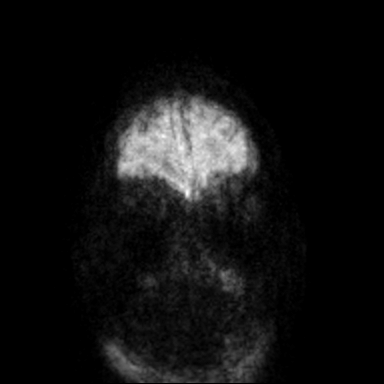
[im 15/38]
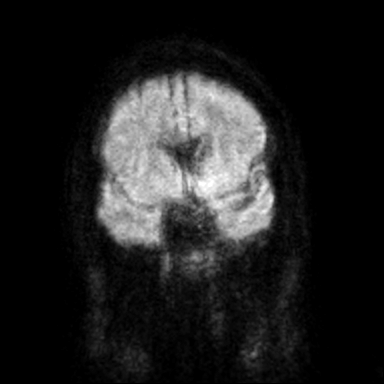
[im 23/38]
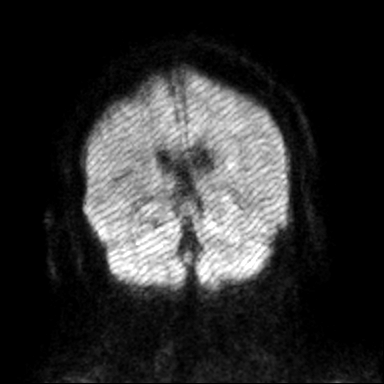
[im 30/38]
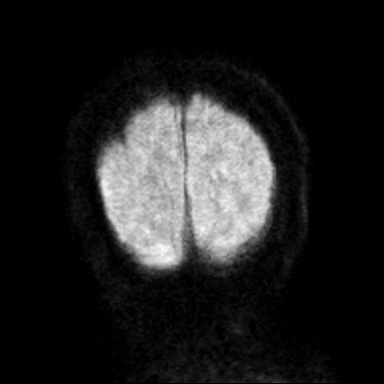
[im 38/38]
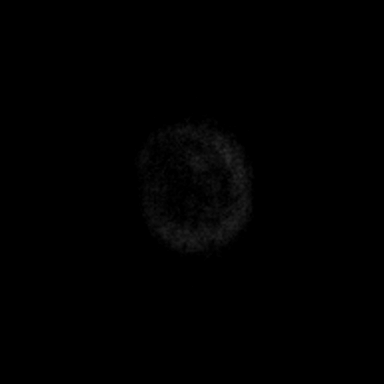

[Series 8: cor dwi_adc · coronal · 5.0mm · 0.60mm/px · 5 of 37 slices shown]
[im 1/37]
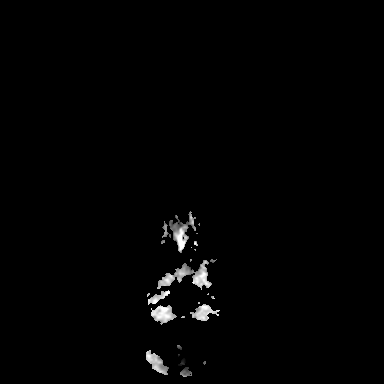
[im 10/37]
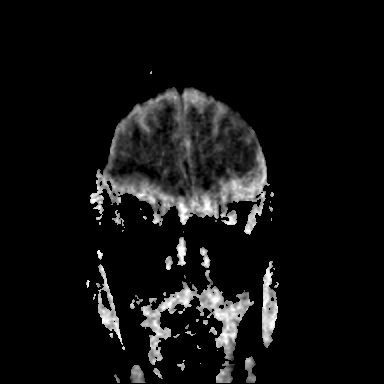
[im 19/37]
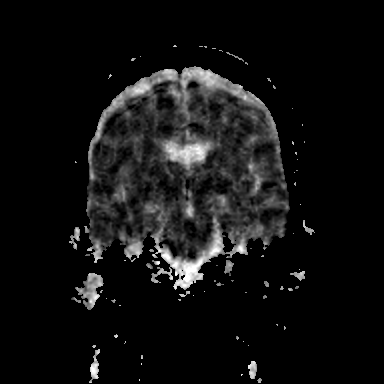
[im 28/37]
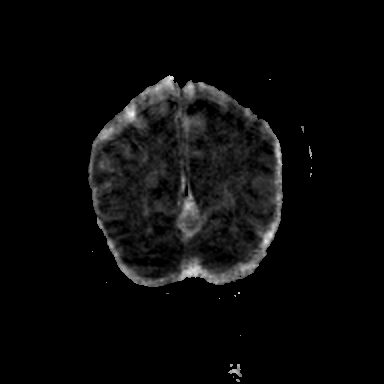
[im 37/37]
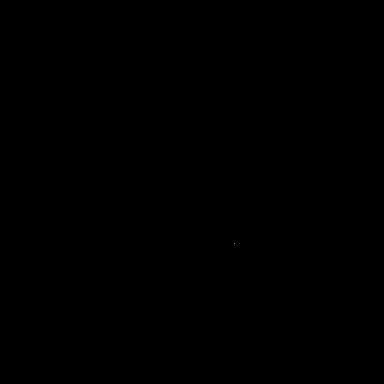

[Series 9: T1 · sagittal · 5.0mm · 0.94mm/px · 3 of 21 slices shown (1 of 2)]
[im 1/21]
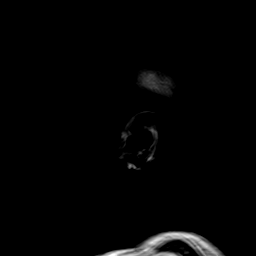
[im 11/21]
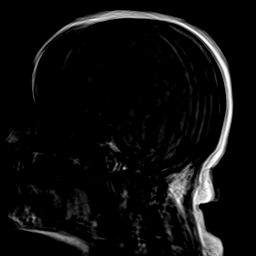
[im 21/21]
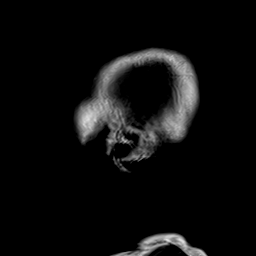

[Series 10: T2 · axial · 5.0mm · 0.45mm/px · z∈[-137,+18]mm · 4 of 27 slices shown (1 of 2)]
[im 1/27]
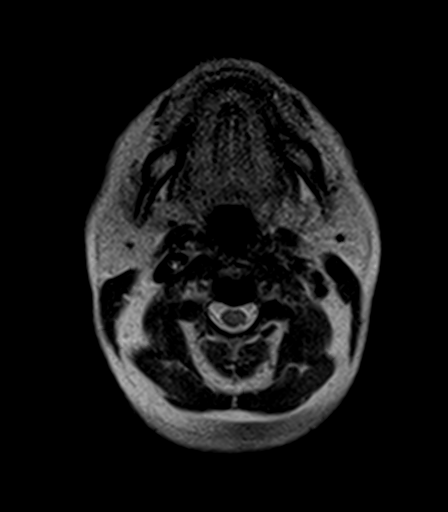
[im 9/27]
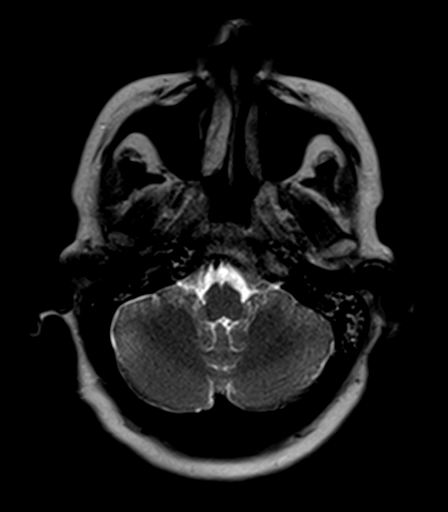
[im 18/27]
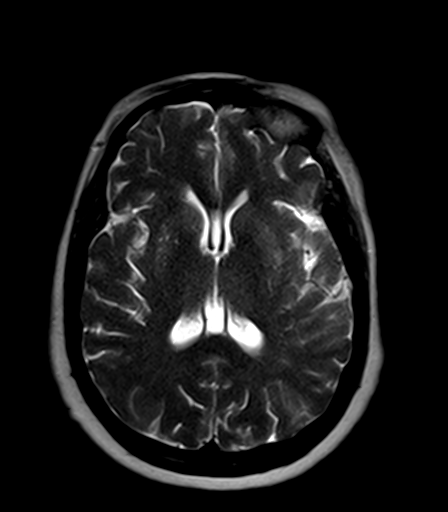
[im 27/27]
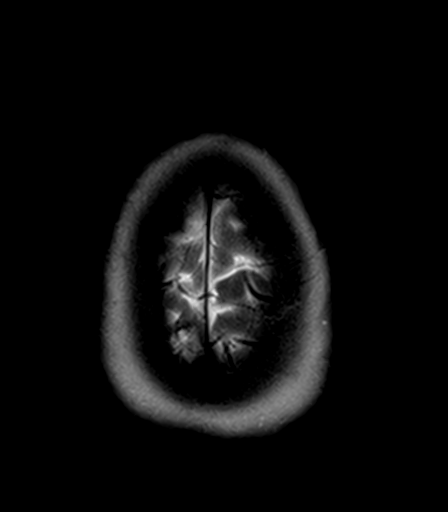

[Series 11: T2-star · axial · 5.0mm · 0.45mm/px · z∈[-137,+18]mm · 4 of 27 slices shown]
[im 1/27]
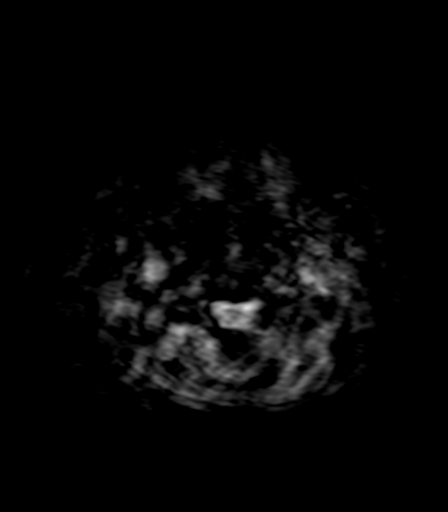
[im 9/27]
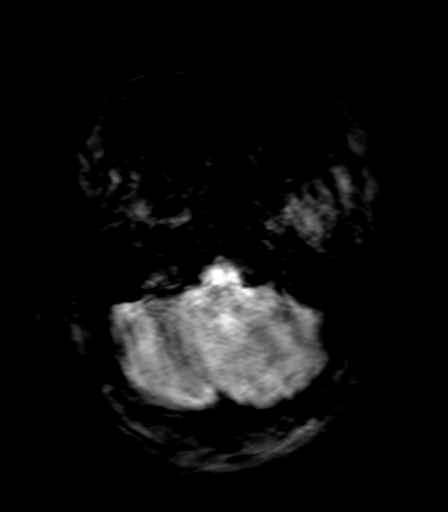
[im 18/27]
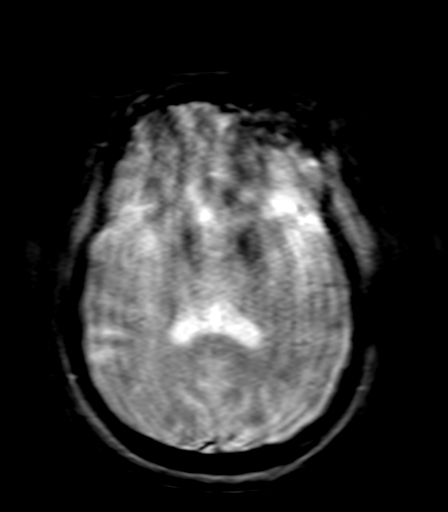
[im 27/27]
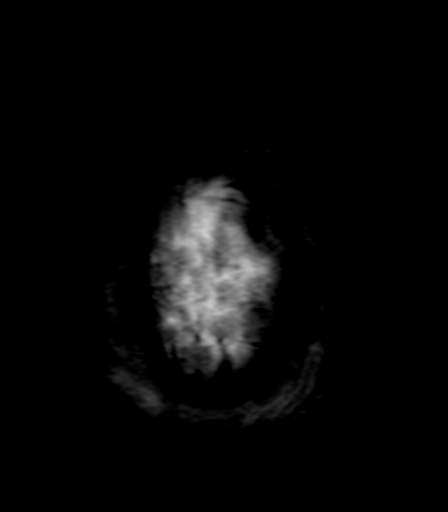

[Series 12: FLAIR · axial · 5.0mm · 1.20mm/px · z∈[-136,+19]mm · 4 of 27 slices shown]
[im 1/27]
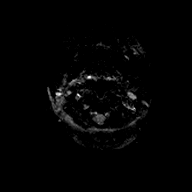
[im 9/27]
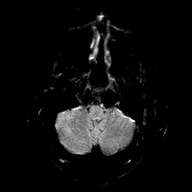
[im 18/27]
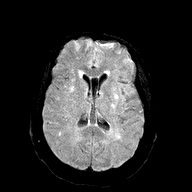
[im 27/27]
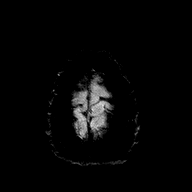

[Series 13: T1 · axial · 5.0mm · 0.90mm/px · z∈[-137,+18]mm · 4 of 27 slices shown (2 of 2)]
[im 1/27]
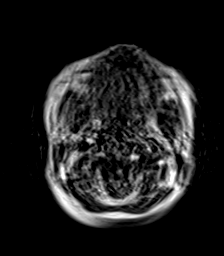
[im 9/27]
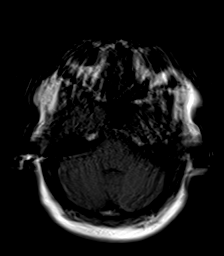
[im 18/27]
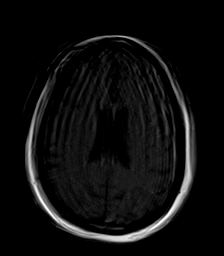
[im 27/27]
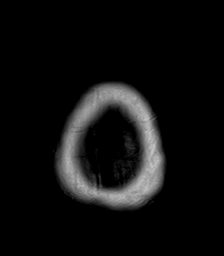

[Series 14: T2 · coronal · 5.0mm · 0.45mm/px · 4 of 31 slices shown (2 of 2)]
[im 1/31]
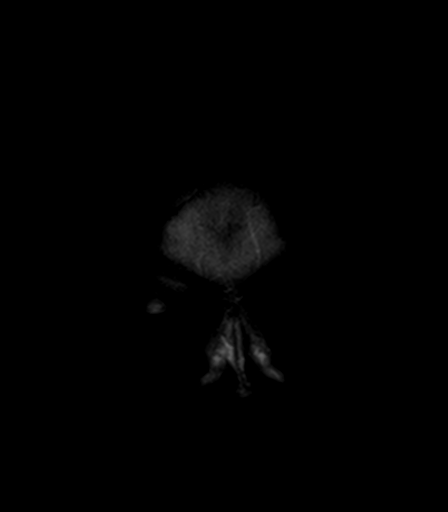
[im 11/31]
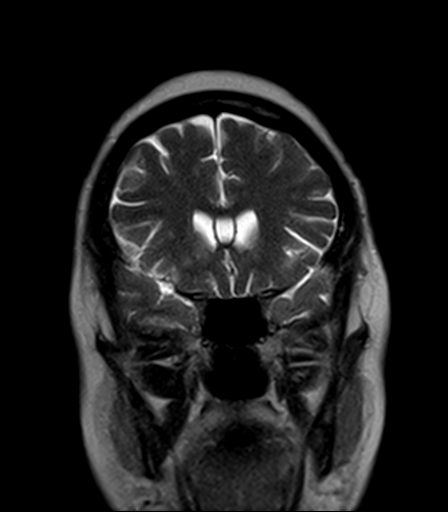
[im 21/31]
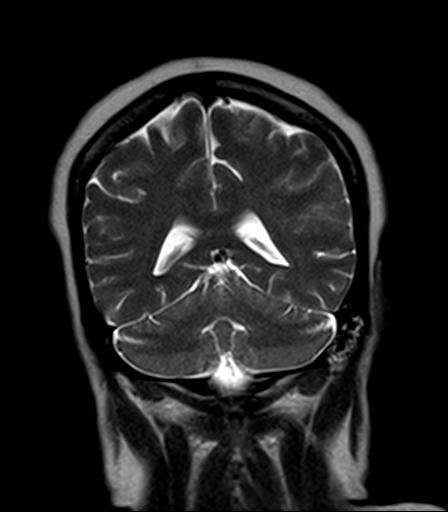
[im 31/31]
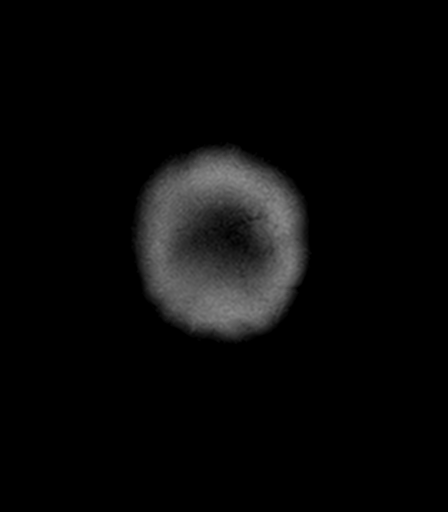

[Series 15: ax dwi_tracew · axial · 3.0mm · 0.71mm/px · z∈[-130,-106]mm · 2 of 50 slices shown]
[im 1/50]
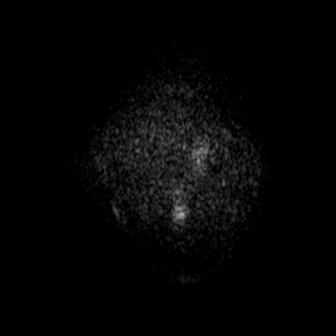
[im 9/50]
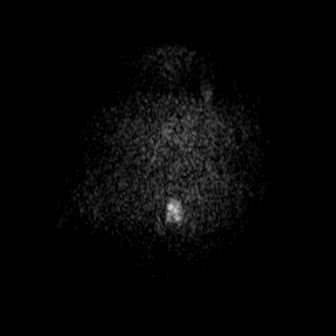

[36 of 48 positions shown; findings below may reference images not displayed]

FINDINGS: Brain: Image quality degraded by moderate to extensive motion.
Multiple sequences repeated.

Negative for acute infarct. Scattered small white matter
hyperintensities bilaterally appear chronic. Brainstem intact.
Negative for hemorrhage, mass, or fluid collection. No midline shift
or hydrocephalus

Vascular: Normal arterial flow voids.

Skull and upper cervical spine: Negative

Sinuses/Orbits: Mild mucosal edema left ethmoid sinus otherwise
sinuses clear. Bilateral mastoid effusion. Negative orbit

Other: None
IMPRESSION: Image quality degraded by significant motion.

No acute abnormality.

## 2019-10-15 SURGERY — ECHOCARDIOGRAM, TRANSESOPHAGEAL
Anesthesia: Moderate Sedation

## 2019-10-15 MED ORDER — CEFTRIAXONE IV (FOR PTA / DISCHARGE USE ONLY): 2.0000 g | Freq: Two times a day (BID) | INTRAVENOUS | 0 refills | Status: AC

## 2019-10-15 MED ORDER — FENTANYL CITRATE (PF) 100 MCG/2ML IJ SOLN
INTRAMUSCULAR | Status: AC
  Filled 2019-10-15: qty 2

## 2019-10-15 MED ORDER — MONTELUKAST SODIUM 10 MG PO TABS: 10.0000 mg | ORAL_TABLET | Freq: Every day | ORAL | 0 refills | Status: AC

## 2019-10-15 MED ORDER — ALBUTEROL SULFATE (2.5 MG/3ML) 0.083% IN NEBU: 2.5000 mg | INHALATION_SOLUTION | RESPIRATORY_TRACT | Status: DC | PRN

## 2019-10-15 MED ORDER — IPRATROPIUM-ALBUTEROL 0.5-2.5 (3) MG/3ML IN SOLN: 3.0000 mL | Freq: Four times a day (QID) | RESPIRATORY_TRACT | 0 refills | Status: AC | PRN

## 2019-10-15 MED ORDER — FLUTICASONE PROPIONATE 50 MCG/ACT NA SUSP: 2.0000 | Freq: Every day | NASAL | 0 refills | Status: DC

## 2019-10-15 MED ORDER — ALBUTEROL SULFATE HFA 108 (90 BASE) MCG/ACT IN AERS: INHALATION_SPRAY | RESPIRATORY_TRACT | 0 refills | Status: DC

## 2019-10-15 MED ORDER — NYSTATIN 100000 UNIT/ML MT SUSP: 5.0000 mL | Freq: Four times a day (QID) | OROMUCOSAL | 0 refills | Status: AC

## 2019-10-15 MED ORDER — MIDAZOLAM HCL 5 MG/5ML IJ SOLN
INTRAMUSCULAR | Status: AC
  Filled 2019-10-15: qty 5

## 2019-10-15 MED ORDER — LIDOCAINE VISCOUS HCL 2 % MT SOLN
OROMUCOSAL | Status: AC
  Filled 2019-10-15: qty 15

## 2019-10-15 MED ORDER — GLIPIZIDE 5 MG PO TABS
5.0000 mg | ORAL_TABLET | Freq: Two times a day (BID) | ORAL | Status: DC
  Administered 2019-10-15: 18:00:00 5 mg via ORAL
  Filled 2019-10-15 (×3): qty 1

## 2019-10-15 MED ORDER — GLIPIZIDE 5 MG PO TABS: 5.0000 mg | ORAL_TABLET | Freq: Two times a day (BID) | ORAL | 0 refills | Status: DC

## 2019-10-15 MED ORDER — POTASSIUM CHLORIDE CRYS ER 20 MEQ PO TBCR
40.0000 meq | EXTENDED_RELEASE_TABLET | Freq: Once | ORAL | Status: AC
  Administered 2019-10-15: 13:00:00 40 meq via ORAL
  Filled 2019-10-15: qty 2

## 2019-10-15 MED ORDER — LORAZEPAM 2 MG/ML IJ SOLN
1.0000 mg | Freq: Once | INTRAMUSCULAR | Status: AC | PRN
  Administered 2019-10-15: 11:00:00 1 mg via INTRAVENOUS
  Filled 2019-10-15 (×2): qty 1

## 2019-10-15 MED ORDER — MIDAZOLAM HCL 2 MG/2ML IJ SOLN
INTRAMUSCULAR | Status: AC | PRN
  Administered 2019-10-15 (×2): 1 mg via INTRAVENOUS

## 2019-10-15 MED ORDER — ENSURE MAX PROTEIN PO LIQD: 11.0000 [oz_av] | Freq: Two times a day (BID) | ORAL | 0 refills | Status: AC

## 2019-10-15 MED ORDER — OMEPRAZOLE 20 MG PO CPDR: 20.0000 mg | DELAYED_RELEASE_CAPSULE | Freq: Two times a day (BID) | ORAL | 0 refills | Status: AC

## 2019-10-15 MED ORDER — FENTANYL CITRATE (PF) 100 MCG/2ML IJ SOLN
INTRAMUSCULAR | Status: AC | PRN
  Administered 2019-10-15 (×2): 25 ug via INTRAVENOUS

## 2019-10-15 MED ORDER — SODIUM CHLORIDE 0.9 % IV SOLN: INTRAVENOUS | Status: DC

## 2019-10-15 MED ORDER — AMLODIPINE BESYLATE 10 MG PO TABS: 10.0000 mg | ORAL_TABLET | Freq: Every day | ORAL | 0 refills | Status: DC

## 2019-10-15 MED ORDER — IPRATROPIUM-ALBUTEROL 0.5-2.5 (3) MG/3ML IN SOLN
3.0000 mL | Freq: Three times a day (TID) | RESPIRATORY_TRACT | Status: DC
  Administered 2019-10-15 – 2019-10-16 (×3): 3 mL via RESPIRATORY_TRACT
  Filled 2019-10-15 (×3): qty 3

## 2019-10-15 MED ORDER — SODIUM CHLORIDE 0.9% FLUSH: 10.0000 mL | Freq: Two times a day (BID) | INTRAVENOUS | 0 refills | Status: AC

## 2019-10-15 MED ORDER — INSULIN DETEMIR 100 UNIT/ML ~~LOC~~ SOLN
4.0000 [IU] | Freq: Two times a day (BID) | SUBCUTANEOUS | Status: DC
  Filled 2019-10-15: qty 0.04

## 2019-10-15 NOTE — Progress Notes (Signed)
Patient ID: Cathy Hughes, female   DOB: [birth year], 59 y.o.   MRN: [id number] [facility] PROGRESS NOTE  Cathy Hughes KGU:[id number] DOB: March 13, 1961 DOA: 10/05/2019 PCP: Donnie Coffin, MD  HPI/Subjective: Patient still feeling weak and wobbly when she is walking.  Feeling better than when he came in.  Objective: Vitals:   10/15/19 0900 10/15/19 0915  BP: (!) 151/76 (!) 117/94  Pulse: 72 69  Resp: 20 15  Temp:    SpO2: 97% 100%    Filed Weights   10/10/19 0500 10/13/19 0445 10/15/19 0500  Weight: 106 kg 103 kg 102.6 kg    ROS: Review of Systems  Constitutional: Positive for malaise/fatigue. Negative for chills and fever.  Eyes: Negative for blurred vision.  Respiratory: Negative for cough and shortness of breath.   Cardiovascular: Negative for chest pain.  Gastrointestinal: Negative for abdominal pain, constipation, diarrhea, nausea and vomiting.  Genitourinary: Negative for dysuria.  Musculoskeletal: Negative for joint pain.  Neurological: Positive for dizziness. Negative for headaches.   Exam: Physical Exam  Constitutional: She is oriented to person, place, and time.  HENT:  Nose: No mucosal edema.  Mouth/Throat: No oropharyngeal exudate or posterior oropharyngeal edema.  Eyes: Pupils are equal, round, and reactive to light. Conjunctivae, EOM and lids are normal.  Neck: Carotid bruit is not present.  Cardiovascular: S1 normal and S2 normal. Exam reveals no gallop.  No murmur heard. Respiratory: No respiratory distress. She has no wheezes. She has no rhonchi. She has no rales.  GI: Soft. Bowel sounds are normal. There is no abdominal tenderness.  Musculoskeletal:     Right ankle: No swelling.     Left ankle: No swelling.  Lymphadenopathy:    She has no cervical adenopathy.  Neurological: She is alert and oriented to person, place, and time. No cranial nerve deficit.  Skin: Skin is warm. No rash noted. Nails show no clubbing.  Psychiatric: She has a  normal mood and affect.      Data Reviewed: Basic Metabolic Panel: Recent Labs  Lab 10/11/19 0607 10/11/19 6283 10/11/19 1953 10/12/19 0557 10/13/19 0313 10/14/19 0936 10/15/19 0408  NA 156*  --   --  153* 150* 143  144 143  K 2.9*   < > 3.3* 3.2* 3.6 3.1*  3.1* 3.4*  CL 113*  --   --  111 111 106  107 108  CO2 33*  --   --  32 _0 GLUCOSE 145*  --   --  101* 129* 155*  157* 126*  BUN 34*  --   --  26* _1 CREATININE 0.76  --   --  0.76 0.75 0.70  0.74 0.76  CALCIUM 9.4  --   --  8.6* 8.8* 8.4*  8.5* 8.6*  MG 2.3  --   --  2.6* 2.7* 2.3 2.5*  PHOS 2.2*   < > 3.9 3.4 2.2* 2.1* 2.6   < > = values in this interval not displayed.   Liver Function Tests: Recent Labs  Lab 10/14/19 0936  ALBUMIN 2.9*   CBC: Recent Labs  Lab 10/11/19 0607 10/12/19 0557 10/13/19 0313 10/14/19 0936 10/15/19 0408  WBC 13.1* 12.6* 12.0* 11.0* 10.7*  NEUTROABS  --  8.7* 8.7* 7.2 7.1  HGB 11.9* 12.2 12.0 11.7* 12.1  HCT 36.3 37.9 37.3 35.8* 37.9  MCV 85.4 85.6 85.9 83.8 84.8  PLT 362 337 245 294 311    CBG:  Recent Labs  Lab 10/14/19 2029 10/15/19 0057 10/15/19 0409 10/15/19 0726 10/15/19 1307  GLUCAP 88 136* 119* 103* 130*    Recent Results (from the past 240 hour(s))  Culture, blood (routine x 2)     Status: Abnormal   Collection Time: 10/06/19 12:13 AM   Specimen: BLOOD  Result Value Ref Range Status   Specimen Description   Final    BLOOD LEFT HAND Performed at [facility], [address], [place], [place] [address]    Special Requests   Final    BOTTLES DRAWN AEROBIC AND ANAEROBIC Blood Culture results may not be optimal due to an inadequate volume of blood received in culture bottles Performed at [facility], [address] [place], [place] [address]    Culture  Setup Time   Final    GRAM POSITIVE COCCI IN BOTH AEROBIC AND ANAEROBIC BOTTLES CRITICAL RESULT CALLED TO, READ BACK BY AND VERIFIED WITH: Rito Ehrlich ON  10/06/19 AT 71 QSD Performed at [facility], [place] [address] [place], [place] [address]    Culture STREPTOCOCCUS PNEUMONIAE (A)  Final   Report Status 10/08/2019 FINAL  Final   Organism ID, Bacteria STREPTOCOCCUS PNEUMONIAE  Final      Susceptibility   Streptococcus pneumoniae - MIC*    ERYTHROMYCIN >=8 RESISTANT Resistant     LEVOFLOXACIN 0.5 SENSITIVE Sensitive     VANCOMYCIN 0.25 SENSITIVE Sensitive     PENICILLIN (non-meningitis) 0.25 SENSITIVE Sensitive     PENICILLIN (oral) 0.25 INTERMEDIATE Intermediate     CEFTRIAXONE (non-meningitis) 0.25 SENSITIVE Sensitive     * STREPTOCOCCUS PNEUMONIAE  Culture, blood (routine x 2)     Status: Abnormal   Collection Time: 10/06/19 12:13 AM   Specimen: BLOOD  Result Value Ref Range Status   Specimen Description   Final    BLOOD RIGHT HAND Performed at [facility], [address], [place], [place] [address]    Special Requests   Final    BOTTLES DRAWN AEROBIC AND ANAEROBIC Blood Culture results may not be optimal due to an inadequate volume of blood received in culture bottles Performed at [facility], [address] [place], [place] [address]    Culture  Setup Time   Final    GRAM POSITIVE COCCI IN BOTH AEROBIC AND ANAEROBIC BOTTLES CRITICAL RESULT CALLED TO, READ BACK BY AND VERIFIED WITH: LISA KLUTTZ ON 10/06/19 AT 1032 QSD Performed at [facility], [address] [place], [place] [address]    Culture (A)  Final    STREPTOCOCCUS PNEUMONIAE SUSCEPTIBILITIES PERFORMED ON PREVIOUS CULTURE WITHIN THE LAST 5 DAYS. Performed at [facility], [place] [address], [place], [place] [address]    Report Status 10/08/2019 FINAL  Final  Blood Culture ID Panel (Reflexed)     Status: Abnormal   Collection Time: 10/06/19 12:13 AM  Result Value Ref Range Status   Enterococcus species NOT DETECTED NOT DETECTED Final   Listeria monocytogenes NOT DETECTED NOT DETECTED Final   Staphylococcus species NOT  DETECTED NOT DETECTED Final   Staphylococcus aureus (BCID) NOT DETECTED NOT DETECTED Final   Streptococcus species DETECTED (A) NOT DETECTED Final    Comment: CRITICAL RESULT CALLED TO, READ BACK BY AND VERIFIED WITH: WALID NAZARI ON 10/06/19 AT 1225 QSD    Streptococcus agalactiae NOT DETECTED NOT DETECTED Final   Streptococcus pneumoniae DETECTED (A) NOT DETECTED Final    Comment: CRITICAL RESULT CALLED TO, READ BACK BY AND VERIFIED WITH:  WALID NAZARI ON 10/06/19 AT 1225 QSD    Streptococcus pyogenes NOT DETECTED NOT DETECTED Final   Acinetobacter baumannii NOT DETECTED NOT DETECTED Final   Enterobacteriaceae species NOT DETECTED NOT DETECTED Final   Enterobacter cloacae complex NOT DETECTED NOT DETECTED Final   Escherichia coli NOT DETECTED NOT DETECTED Final   Klebsiella oxytoca NOT DETECTED NOT DETECTED Final   Klebsiella pneumoniae NOT DETECTED NOT DETECTED Final   Proteus species NOT DETECTED NOT DETECTED Final   Serratia marcescens NOT DETECTED NOT DETECTED Final   Haemophilus influenzae NOT DETECTED NOT DETECTED Final   Neisseria meningitidis NOT DETECTED NOT DETECTED Final   Pseudomonas aeruginosa NOT DETECTED NOT DETECTED Final   Candida albicans NOT DETECTED NOT DETECTED Final   Candida glabrata NOT DETECTED NOT DETECTED Final   Candida krusei NOT DETECTED NOT DETECTED Final   Candida parapsilosis NOT DETECTED NOT DETECTED Final   Candida tropicalis NOT DETECTED NOT DETECTED Final    Comment: Performed at [facility], [address], [place], [place] [address]  Urine Culture     Status: None   Collection Time: 10/06/19 12:51 AM   Specimen: Urine, Random  Result Value Ref Range Status   Specimen Description   Final    URINE, RANDOM Performed at [facility], [address] [place], [place] [address]    Special Requests   Final    NONE Performed at [facility], [address], [place], [place] [address]    Culture   Final    NO  GROWTH Performed at [facility], [place] [address] [place], [place] [address]    Report Status 10/07/2019 FINAL  Final  SARS CORONAVIRUS 2 (TAT 6-24 HRS) Nasopharyngeal Nasopharyngeal Swab     Status: None   Collection Time: 10/06/19  2:30 AM   Specimen: Nasopharyngeal Swab  Result Value Ref Range Status   SARS Coronavirus 2 NEGATIVE NEGATIVE Final    Comment: (NOTE) SARS-CoV-2 target nucleic acids are NOT DETECTED. The SARS-CoV-2 RNA is generally detectable in upper and lower respiratory specimens during the acute phase of infection. Negative results do not preclude SARS-CoV-2 infection, do not rule out co-infections with other pathogens, and should not be used as the sole basis for treatment or other patient management decisions. Negative results must be combined with clinical observations, patient history, and epidemiological information. The expected result is Negative. Fact Sheet for Patients: SugarRoll.be Fact Sheet for Healthcare Providers: https://www.woods-mathews.com/ This test is not yet approved or cleared by the [place] FDA and  has been authorized for detection and/or diagnosis of SARS-CoV-2 by FDA under an Emergency Use Authorization (EUA). This EUA will remain  in effect (meaning this test can be used) for the duration of the COVID-19 declaration under Section 56 4(b)(1) of the Act, 21 U.S.C. section 360bbb-3(b)(1), unless the authorization is terminated or revoked sooner. Performed at [facility], [place] [address], [place],  [address]   Respiratory Panel by PCR     Status: None   Collection Time: 10/06/19  3:40 AM   Specimen: Nasopharyngeal Swab; Respiratory  Result Value Ref Range Status   Adenovirus NOT DETECTED NOT DETECTED Final   Coronavirus 229E NOT DETECTED NOT DETECTED Final    Comment: (NOTE) The Coronavirus on the Respiratory Panel, DOES NOT test for the novel  Coronavirus (2019 nCoV)     Coronavirus HKU1 NOT DETECTED NOT DETECTED Final   Coronavirus NL63 NOT DETECTED NOT DETECTED Final   Coronavirus OC43 NOT  DETECTED NOT DETECTED Final   Metapneumovirus NOT DETECTED NOT DETECTED Final   Rhinovirus / Enterovirus NOT DETECTED NOT DETECTED Final   Influenza A NOT DETECTED NOT DETECTED Final   Influenza B NOT DETECTED NOT DETECTED Final   Parainfluenza Virus 1 NOT DETECTED NOT DETECTED Final   Parainfluenza Virus 2 NOT DETECTED NOT DETECTED Final   Parainfluenza Virus 3 NOT DETECTED NOT DETECTED Final   Parainfluenza Virus 4 NOT DETECTED NOT DETECTED Final   Respiratory Syncytial Virus NOT DETECTED NOT DETECTED Final   Bordetella pertussis NOT DETECTED NOT DETECTED Final   Chlamydophila pneumoniae NOT DETECTED NOT DETECTED Final   Mycoplasma pneumoniae NOT DETECTED NOT DETECTED Final    Comment: Performed at [facility], [place] [address] [place], [place] [address]  MRSA PCR Screening     Status: None   Collection Time: 10/06/19  3:41 AM   Specimen: Nasopharyngeal  Result Value Ref Range Status   MRSA by PCR NEGATIVE NEGATIVE Final    Comment:        The GeneXpert MRSA Assay (FDA approved for NASAL specimens only), is one component of a comprehensive MRSA colonization surveillance program. It is not intended to diagnose MRSA infection nor to guide or monitor treatment for MRSA infections. Performed at [facility], [address] [place], [place] [address]   CULTURE, BLOOD (ROUTINE X 2) w Reflex to ID Panel     Status: None   Collection Time: 10/07/19  1:17 AM   Specimen: BLOOD  Result Value Ref Range Status   Specimen Description BLOOD LEFT HAND  Final   Special Requests   Final    BOTTLES DRAWN AEROBIC AND ANAEROBIC Blood Culture adequate volume   Culture   Final    NO GROWTH 5 DAYS Performed at [facility], [address] [place], [place] [address]    Report Status 10/12/2019 FINAL  Final  CULTURE, BLOOD (ROUTINE X 2) w  Reflex to ID Panel     Status: None   Collection Time: 10/07/19  1:17 AM   Specimen: BLOOD  Result Value Ref Range Status   Specimen Description BLOOD LEFT WRIST  Final   Special Requests   Final    BOTTLES DRAWN AEROBIC AND ANAEROBIC Blood Culture adequate volume   Culture   Final    NO GROWTH 5 DAYS Performed at [facility], [address], [place], [place] [address]    Report Status 10/12/2019 FINAL  Final  CSF culture     Status: None   Collection Time: 10/07/19  1:22 PM   Specimen: PATH Cytology CSF; Cerebrospinal Fluid  Result Value Ref Range Status   Specimen Description   Final    CSF Performed at [facility], [address], [place], [place] [address]    Special Requests   Final    NONE Performed at [facility], [address] [place], [place] [address]    Gram Stain   Final    NO ORGANISMS SEEN WBC SEEN RED BLOOD CELLS RESULT CALLED TO, READ BACK BY AND VERIFIED WITH: MEGAN SCHEFFIELD 10/07/19 @ 1526  [employer] Performed at [facility], [address] [place], [place] [address]    Culture   Final    NO GROWTH 3 DAYS Performed at [facility], [place] [address] [place], [place] [address]    Report Status 10/11/2019 FINAL  Final  Fungus Culture With Stain     Status: None (Preliminary result)  Collection Time: 10/07/19  1:22 PM   Specimen: PATH Cytology CSF; Cerebrospinal Fluid  Result Value Ref Range Status   Fungus Stain Final report  Final    Comment: (NOTE) Performed At: [facility] [place], [place] [id number] Rush Farmer MD GT:[id number]    Fungus (Mycology) Culture PENDING  Incomplete   Fungal Source CSF  Final    Comment: Performed at [facility], [address] [place], [place] [address]  Fungus Culture Result     Status: None   Collection Time: 10/07/19  1:22 PM  Result Value Ref Range Status   Result 1 Comment  Final    Comment: (NOTE) KOH/Calcofluor preparation:   no fungus observed. Performed At: [facility] [place], [place] [id number] Rush Farmer MD [id number]      Studies: CT ANGIO HEAD W OR WO CONTRAST  Result Date: 10/13/2019 CLINICAL DATA:  New onset right-sided weakness.  Meningitis. EXAM: CT ANGIOGRAPHY HEAD CT VENOGRAM HEAD TECHNIQUE: Multidetector CT imaging of the head was performed using the standard protocol during bolus administration of intravenous contrast. Multiplanar CT image reconstructions and MIPs were obtained to evaluate the vascular anatomy. CONTRAST:  15m OMNIPAQUE IOHEXOL 350 MG/ML SOLN COMPARISON:  Head CTA 10/07/2019 and MRI 10/06/2019 FINDINGS: CT HEAD Brain: No acute large territory infarct, intracranial hemorrhage, mass, midline shift, or extra-axial fluid collection is identified. The ventricles and sulci are normal in size. 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 Vascular: Reported below. Skull: No fracture suspicious osseous lesion. Sinuses: Mild posterior left ethmoid air cell mucosal thickening. Small mastoid effusions. Orbits: Unremarkable. CTA HEAD Anterior circulation: The internal carotid arteries are widely patent from skull base to carotid termini. ACAs and MCAs are patent without evidence of proximal branch occlusion or significant proximal stenosis. No aneurysm is identified. Posterior circulation: The visualized distal vertebral arteries are patent to the basilar with the left being dominant. Patent SCA is are seen bilaterally. The basilar artery is widely patent. There are small posterior communicating arteries bilaterally. Both PCAs are patent without evidence of significant proximal stenosis. No aneurysm is identified. Anatomic variants: None. CTV HEAD The superior sagittal sinus, internal cerebral veins, vein of Galen, straight sinus, right transverse sinus, and right sigmoid sinus are patent. The left transverse and left sigmoid sinuses are hypoplastic resulting in  suboptimal visualization. IMPRESSION: 1. No evidence of acute intracranial abnormality. 2. No major intracranial arterial occlusion or significant stenosis. 3. No evidence of dural venous sinus thrombosis. Electronically Signed   By: ALogan BoresM.D.   On: 10/13/2019 18:04   MR BRAIN WO CONTRAST  Result Date: 10/15/2019 CLINICAL DATA:  Bacterial meningitis EXAM: MRI HEAD WITHOUT CONTRAST TECHNIQUE: Multiplanar, multiecho pulse sequences of the brain and surrounding structures were obtained without intravenous contrast. COMPARISON:  CT head 10/13/2019 FINDINGS: Brain: Image quality degraded by moderate to extensive motion. Multiple sequences repeated. Negative for acute infarct. Scattered small white matter hyperintensities bilaterally appear chronic. Brainstem intact. Negative for hemorrhage, mass, or fluid collection. No midline shift or hydrocephalus Vascular: Normal arterial flow voids. Skull and upper cervical spine: Negative Sinuses/Orbits: Mild mucosal edema left ethmoid sinus otherwise sinuses clear. Bilateral mastoid effusion. Negative orbit Other: None IMPRESSION: Image quality degraded by significant motion. No acute abnormality. Electronically Signed   By: CFranchot GalloM.D.   On: 10/15/2019 12:33   ECHO TEE  Result Date: 10/15/2019 AKate Sable MD     10/15/2019  8:51 AM Transesophageal Echocardiogram : Indication: bacteremia  Requesting/ordering  physician: Marrianne Mood PA Procedure:  5cc of viscous lidocaine were given orally to provide local anesthesia to the oropharynx. The patient was positioned supine on the left side, bite block provided. The patient was moderately sedated with the doses of versed and fentanyl as detailed below.  Using digital technique an omniplane probe was advanced into the esophagus without incident. Moderate sedation: 1. Sedation used:  Versed: 15m, Fentanyl: 538m2. Time administered: 8:31am    Time when patient started recovery:8:41am 3. I was face to face  during this time, 9 mins See report in EPIC  for complete details: In brief, imaging revealed normal LV function with no RWMAs and no mural apical thrombus. There was no evidence of endocarditis.  Estimated ejection fraction was 60%.  Right sided cardiac chambers were normal The LA was well visualized in orthogonal views. There was no spontaneous contrast and no thrombus in the LA and LA appendage The descending thoracic aorta had no  mural aortic debris with no evidence of aneurysmal dilation or disection BrKate Sable/11/2019 8:47 AM   CT VENOGRAM HEAD  Result Date: 10/13/2019 CLINICAL DATA:  New onset right-sided weakness.  Meningitis. EXAM: CT ANGIOGRAPHY HEAD CT VENOGRAM HEAD TECHNIQUE: Multidetector CT imaging of the head was performed using the standard protocol during bolus administration of intravenous contrast. Multiplanar CT image reconstructions and MIPs were obtained to evaluate the vascular anatomy. CONTRAST:  7548mMNIPAQUE IOHEXOL 350 MG/ML SOLN COMPARISON:  Head CTA 10/07/2019 and MRI 10/06/2019 FINDINGS: CT HEAD Brain: No acute large territory infarct, intracranial hemorrhage, mass, midline shift, or extra-axial fluid collection is identified. The ventricles and sulci are normal in size. 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 Vascular: Reported below. Skull: No fracture suspicious osseous lesion. Sinuses: Mild posterior left ethmoid air cell mucosal thickening. Small mastoid effusions. Orbits: Unremarkable. CTA HEAD Anterior circulation: The internal carotid arteries are widely patent from skull base to carotid termini. ACAs and MCAs are patent without evidence of proximal branch occlusion or significant proximal stenosis. No aneurysm is identified. Posterior circulation: The visualized distal vertebral arteries are patent to the basilar with the left being dominant. Patent SCA is are seen bilaterally. The basilar artery is widely patent. There are small posterior communicating  arteries bilaterally. Both PCAs are patent without evidence of significant proximal stenosis. No aneurysm is identified. Anatomic variants: None. CTV HEAD The superior sagittal sinus, internal cerebral veins, vein of Galen, straight sinus, right transverse sinus, and right sigmoid sinus are patent. The left transverse and left sigmoid sinuses are hypoplastic resulting in suboptimal visualization. IMPRESSION: 1. No evidence of acute intracranial abnormality. 2. No major intracranial arterial occlusion or significant stenosis. 3. No evidence of dural venous sinus thrombosis. Electronically Signed   By: AllLogan BoresD.   On: 10/13/2019 18:04    Scheduled Meds: . amLODipine  10 mg Oral Daily  . budesonide (PULMICORT) nebulizer solution  0.5 mg Nebulization BID  . chlorhexidine gluconate (MEDLINE KIT)  15 mL Mouth Rinse BID  . enoxaparin (LOVENOX) injection  40 mg Subcutaneous Daily  . fentaNYL      . fluticasone  2 spray Each Nare Daily  . glipiZIDE  5 mg Oral BID AC  . insulin aspart  0-15 Units Subcutaneous TID WC  . ipratropium-albuterol  3 mL Nebulization TID  . lidocaine      . midazolam      . montelukast  10 mg Oral Daily  . nystatin  5 mL Oral QID  .  pantoprazole  40 mg Oral Daily  . Ensure Max Protein  11 oz Oral BID BM   Continuous Infusions: . sodium chloride 250 mL (10/15/19 1310)  . cefTRIAXone (ROCEPHIN)  IV 2 g (10/15/19 1312)  . famotidine (PEPCID) IV 20 mg (10/15/19 0022)    Assessment/Plan:  1. Sepsis with meningitis and acute metabolic encephalopathy, secondary to Streptococcus pneumonia.  Blood cultures positive.  Echocardiogram negative for endocarditis.  Repeat MRI negative for stroke or abscess.  Infectious disease recommended IV antibiotics through 10/22/2019.  Will get midline today.  Case discussed with transitional care team and they will set up home health for IV antibiotics.  Patient on high-dose Rocephin twice a day 2. Right-sided weakness.  MRI of the brain  negative but did have motion aggregation.  This could be secondary to meningitis. 3. Acute metabolic encephalopathy.  This has improved. 4. Hypernatremia.  This has resolved 5. Essential hypertension restarted amlodipine 6. COPD.  Check pulse ox on room air to see if she qualifies for oxygen at home. 7. Thrush on nystatin swish and swallow 8. Type 2 diabetes with hyperglycemia.  As per the diabetes coordinator, the patient does not want to do insulin at home.  Start on glipizide 5 mg twice a day.  Code Status:     Code Status Orders  (From admission, onward)         Start     Ordered   10/06/19 0010  Full code  Continuous     10/06/19 0016        Code Status History    This patient has a current code status but no historical code status.   Advance Care Planning Activity     Family Communication: Spoke with husband at the bedside Disposition Plan: Since infectious disease wants IV antibiotics through 10/22/2019, transitional care team has to find home health.  We will get PICC line set up today.  Consultants:  Infectious disease  Cardiology  Neurology  Antibiotics:  Rocephin 2 g IV every 12 hours through 10/22/2019  Time spent: 27 minutes in coordination of care speaking with transitional care team and infectious disease  [place]  [facility]

## 2019-10-15 NOTE — Progress Notes (Signed)
OT Cancellation Note  Patient Details Name: Cathy Hughes MRN: [id number] DOB: [birth year]   Cancelled Treatment:    Reason Eval/Treat Not Completed: Patient at procedure or test/ unavailable  Pt off floor at TEE at this time. Will f/u as able for OT treatment. Thank you.   Gerrianne Scale, [place], OTR/L ascom [phone] 10/15/19, 8:28 AM

## 2019-10-15 NOTE — Progress Notes (Signed)
Inpatient Rehabilitation-Admissions Coordinator   Spoke with pt via phone regarding recommended rehab program. Pt has declined offer for CIR and states she has been away from home and her family for too long. Pt does feel she has enough support at home to help her. Due to refusal, AC will sign off.   Raechel Ache, OTR/L  Rehab Admissions Coordinator  [phone] 10/15/2019 1:06 PM

## 2019-10-15 NOTE — Progress Notes (Signed)
*  PRELIMINARY RESULTS* Echocardiogram Echocardiogram Transesophageal has been performed.  Sherrie Sport 10/15/2019, 8:56 AM

## 2019-10-15 NOTE — Consult Note (Signed)
PHARMACY CONSULT NOTE - FOLLOW UP  Pharmacy Consult for Electrolyte Monitoring and Replacement   Recent Labs: Potassium (mmol/L)  Date Value  10/15/2019 3.4 (L)   Magnesium (mg/dL)  Date Value  10/15/2019 2.5 (H)   Calcium (mg/dL)  Date Value  10/15/2019 8.6 (L)   Albumin (g/dL)  Date Value  10/14/2019 2.9 (L)   Phosphorus (mg/dL)  Date Value  10/15/2019 2.6   Sodium (mmol/L)  Date Value  10/15/2019 143   Corrected Ca: 9.48 mg/dL  Assessment: Pharmacy has been consulted to manage electrolytes for this 59 year-old female admitted with pneumococcal bacteremia/bacterial meningitis.  Patient has a PMH significant for prediabetes, hypertension, and COPD.  Renal function has improved and at what appears to be her baseline. Patient was extubated 1/29.    Electrolytes Goal:  All electrolytes WNL  Ph wnl's - no replenishment warranted at this time  Na wnl's - D5 at 50 mL/hr - dc'ed  K = 3.4 - will give 66meq po x 1  F/U Electrolytes with AM labs.  Glucose:  BG 88-156 over last 24 hrs.  A1c 8.2.   Total SSI required over the previous 24 hours: 5 units on Novolog 0-15 units mealtimes and bedtime and Levemir 8 units BID  Bowel Regimen LBM 2/2  No Active bowel Regimen Will continue to monitor.   Patient stable and out of ICU - pharmacy will sign off if no replenishment warranted tomorrow  Lu Duffel, PharmD, BCPS Clinical Pharmacist 10/15/2019 10:13 AM

## 2019-10-15 NOTE — Progress Notes (Addendum)
ID  Pt doing well No fever No headache Has some weakness rt side she says Patient Vitals for the past 24 hrs:  BP Temp Temp src Pulse Resp SpO2 Weight  10/15/19 0915 (!) 117/94 -- -- 69 15 100 % --  10/15/19 0900 (!) 151/76 -- -- 72 20 97 % --  10/15/19 0845 (!) 155/77 -- -- 74 (!) 25 95 % --  10/15/19 0843 (!) 153/82 -- -- 80 (!) 23 97 % --  10/15/19 0840 99/64 -- -- 100 18 96 % --  10/15/19 0836 (!) 157/146 -- -- 76 17 98 % --  10/15/19 0830 (!) 176/86 -- -- 74 18 99 % --  10/15/19 0828 (!) 153/99 -- -- 68 18 98 % --  10/15/19 0801 -- -- -- 69 16 97 % --  10/15/19 0758 (!) 167/57 98.3 F (36.8 C) Oral 71 (!) 22 96 % --  10/15/19 0727 (!) 151/74 99.2 F (37.3 C) Oral 63 -- 94 % --  10/15/19 0500 -- -- -- -- -- -- 102.6 kg  10/15/19 0214 -- -- -- -- -- 98 % --  10/14/19 2326 (!) 162/94 98.9 F (37.2 C) Oral 70 20 100 % --  10/14/19 1945 -- -- -- -- -- 94 % --  10/14/19 1606 (!) 156/79 97.7 F (36.5 C) -- 66 16 94 % --    O/e awake and alert - OX 5 Eyes FROM No diplopia No neck rigidity Chest b/l air entry CNS : did not make her walk- I could not appreciate difference between the two sides in power   CBC Latest Ref Rng & Units 10/15/2019 10/14/2019 10/13/2019  WBC 4.0 - 10.5 K/uL 10.7(H) 11.0(H) 12.0(H)  Hemoglobin 12.0 - 15.0 g/dL 12.1 11.7(L) 12.0  Hematocrit 36.0 - 46.0 % 37.9 35.8(L) 37.3  Platelets 150 - 400 K/uL 311 294 245    CMP Latest Ref Rng & Units 10/15/2019 10/14/2019 10/14/2019  Glucose 70 - 99 mg/dL 126(H) 155(H) 157(H)  BUN 6 - 20 mg/dL 10 12 12   Creatinine 0.44 - 1.00 mg/dL 0.76 0.70 0.74  Sodium 135 - 145 mmol/L 143 143 144  Potassium 3.5 - 5.1 mmol/L 3.4(L) 3.1(L) 3.1(L)  Chloride 98 - 111 mmol/L 108 106 107  CO2 22 - 32 mmol/L 24 26 27   Calcium 8.9 - 10.3 mg/dL 8.6(L) 8.4(L) 8.5(L)  Total Protein 6.5 - 8.1 g/dL - - -  Total Bilirubin 0.3 - 1.2 mg/dL - - -  Alkaline Phos 38 - 126 U/L - - -  AST 15 - 41 U/L - - -  ALT 0 - 44 U/L - - -     Impression/Recommendation  Pneumococcus bacteremia and meningitis Repeat blood culture negative TEE negative Currently on IV ceftriaxone 2 grams IV q 12. Last date is 10/22/19 ( total of 15 days)  Had hypoxic resp failure and was intubated for a few days. That is resolved   C/o rt sided weakness MRI done today :Scattered small white matter hyperintensities bilaterally appear chronic  Will follow as OP Discussed with her and her husband regarding completing treatment for meningitis and need for 1 more week of Iv antibiotics- after that she will need pneumococcal vaccination

## 2019-10-15 NOTE — Treatment Plan (Signed)
Diagnosis: Pneumococcus  bacteremia and meningitis Baseline Creatinine 0.7   Allergies  Allergen Reactions  . Aspirin Hives  . Tramadol Other (See Comments)    Headache     OPAT Orders Discharge antibiotics: Ceftriaxone 2 grams IV every 12 hours until 10/22/19   PIC/MIDline  Care Per Protocol:including placement of Biopatch  Labs weekly Monday while on IV antibiotics: _X_ CBC with differential  _X_ CMP   _X__ Please leave PIC in place until doctor has seen patient or been notified  Fax weekly labs to Dr.Vaibhav Fogleman [phone] promptly  Clinic Follow Up Appt:Telemed visit next week Will schedule appt  Call [phone] with any questions

## 2019-10-15 NOTE — Progress Notes (Addendum)
SLP Cancellation Note  Patient Details Name: Cathy Hughes MRN: [id number] DOB: Sep 23, 1960   Cancelled treatment:       Reason Eval/Treat Not Completed: (chart reviewed; consulted NSG re: pt's status). Pt's diet has now upgraded to regular per MD; discussed w/ NSG any issues or concerns. NSG denied any difficulty w/ the upgraded diet stating she's "doing fine". MD note today indicated pt's status is improving; no respiratory issues or decline.  Pt does NOT wear her Dentures baseline here or at home per her report. Recommend a more Mech Soft diet w/ Cut meats diet/moistened foods d/t Edentulous status w/ general aspiration precautions w/ oral intake; Pills in Puree if needed for easier, safer swallowing d/t overall weakness, illness. NSG to reconsult ST services if any needs arise while admitted.     Orinda Kenner, MS, CCC-SLP Cathy Hughes 10/15/2019, 3:45 PM

## 2019-10-15 NOTE — Progress Notes (Signed)
PHARMACY CONSULT NOTE FOR:  OUTPATIENT  PARENTERAL ANTIBIOTIC THERAPY (OPAT)  Indication: pneumococcal bacteremia and meningitis Regimen: ceftriaxone 2gm IV q12h End date: 10/22/2019  IV antibiotic discharge orders are pended. To discharging provider:  please sign these orders via discharge navigator,  Select New Orders & click on the button choice - Manage This Unsigned Work.     Thank you for allowing pharmacy to be a part of this patient's care.  Doreene Eland, PharmD, BCPS.   Work Cell: [phone] 10/15/2019 11:16 AM

## 2019-10-15 NOTE — Progress Notes (Signed)
PT Cancellation Note  Patient Details Name: Cathy Hughes MRN: [id number] DOB: [birth year]   Cancelled Treatment:    Reason Eval/Treat Not Completed: Other (comment)(Pt out of room at this time, PT to follow up as able.)  Lieutenant Diego PT, DPT 12:09 PM,10/15/19

## 2019-10-15 NOTE — Progress Notes (Signed)
   10/15/19 1400  Clinical Encounter Type  Visited With Patient and family together  Visit Type Initial  Referral From Chaplain  Consult/Referral To [place] visited with patient and husband. Patient said that she feels better and is ready to go home. Patient said they won't let me go. Patient was preoccupied with finding a  piece of paper, therefore patient left. Chaplain offered pastoral presence and empathy.

## 2019-10-15 NOTE — TOC Progression Note (Signed)
Transition of Care [facility]) - Progression Note    Patient Details  Name: Cathy Hughes MRN: [id number] Date of Birth: [birth year]  Transition of Care [facility]) CM/SW Contact  Su Hilt, RN Phone Number: 10/15/2019, 3:53 PM  Clinical Narrative:     Cathy Hughes medications into the room from Medication Management, I explained to the patient that she will DC tomorrow we want to make sure that everything she needs is set up at home. I strongly reiterated that she is not to touch the dressing on the midline and that it can cause major infection if she does, if anything happens and it becomes loose she is to call the Home health Nurse to manage it.  She agreed.    Expected Discharge Plan: [place] Barriers to Discharge: Continued Medical Work up  Expected Discharge Plan and Services Expected Discharge Plan: [place]   Discharge Planning Services: Other - See comment(glucometer, IV ABX, [facility])   Living arrangements for the past 2 months: Single Family Home                 DME Arranged: Glucometer DME Agency: [employer] Date DME Agency Contacted: 10/14/19 Time DME Agency Contacted: 1500 Representative spoke with at DME Agency: [place]: PT, OT, Nurse's Aide [place] Agency: [facility] Date [place]: 10/14/19 Time [place]: 1504 Representative spoke with at [place]: [place] ([place]) Interventions    Readmission Risk Interventions No flowsheet data found.

## 2019-10-15 NOTE — Progress Notes (Addendum)
Inpatient Diabetes Program Recommendations  AACE/ADA: New Consensus Statement on Inpatient Glycemic Control (2015)  Target Ranges:  Prepandial:   less than 140 mg/dL      Peak postprandial:   less than 180 mg/dL (1-2 hours)      Critically ill patients:  140 - 180 mg/dL   Lab Results  Component Value Date   GLUCAP 103 (H) 10/15/2019   HGBA1C 8.2 (H) 10/06/2019    Patient out of room for MRI. Husband in room will be assisting patient at time of d/c. Husband said pt will be going home with home health.  Husband had many questions related to diet and glucose checks.  Discussed checking glucose twice a day (fasting and alternating second check). The more information they can get will help medication adjustment when home.  Discussed diet and beverage options. Husband reports he does the cooking at home. Patient had juice and chickfila lemonade, chicken biscuit, hash browns at bedside. Pt had minimally eaten part of the chicken breast off the biscuit, but drank half of an orange juice with 39 grams of carbs. After coaching husband. Husband poured juice out and removed lemonade so he would drink it instead of pt. Cautioned about pt eating so many carbs. Also discussed certain supplements. Spoke to husband about ensure max, another supplements outpatient.  May consider starting Glipizide 5 mg bid inpatient as pt does not want to be on insulin outpatient.  Reduce Levemir to 4 units bid.  Will need glucose meter at time of d/c. To follow up at [facility] for PCP.  Thanks,  Tama Headings RN, MSN, BC-ADM Inpatient Diabetes Coordinator Team Pager [phone] (8a-5p)

## 2019-10-15 NOTE — Progress Notes (Signed)
Attempted to ambulate to qualify for O2 and pt was only able to stand up from bed with mod assist of one.  She was weak and unsteady on her feet.  Poor safety awareness.  Pt was weaned from 3L [place] to 2l [place] o2.  When nurse back in to reassess pt was laying in bed without o2 on sat 97% on room air.  Will make oncoming nurse aware.

## 2019-10-15 NOTE — TOC Progression Note (Signed)
Transition of Care [facility]) - Progression Note    Patient Details  Name: Cathy Hughes MRN: [id number] Date of Birth: 11/16/1960  Transition of Care [facility]) CM/SW [place], RN Phone Number: 10/15/2019, 10:37 AM  Clinical Narrative:     [name] with [facility] of the need for IV ABX, notified [name] with [facility] that a RN is needed to be added  Expected Discharge Plan: [place] Barriers to Discharge: Continued Medical Work up  Expected Discharge Plan and Services Expected Discharge Plan: [place]   Discharge Planning Services: Medication Assistance, CM Consult   Living arrangements for the past 2 months: Single Family Home                 DME Arranged: 3-N-1, Walker rolling, Oxygen, Nebulizer/meds DME Agency: [employer] Date DME Agency Contacted: 10/14/19 Time DME Agency Contacted: 1500 Representative spoke with at DME Agency: [place]: PT, OT, Nurse's Aide [place] Agency: [facility] Date [place]: 10/14/19 Time [place]: 1504 Representative spoke with at [place]: [place] ([place]) Interventions    Readmission Risk Interventions No flowsheet data found.

## 2019-10-15 NOTE — Progress Notes (Signed)
Arrived to place patient's Midline and she was heading to MRI, RN asked to notify IV team when patient returns

## 2019-10-15 NOTE — Procedures (Signed)
Transesophageal Echocardiogram :  Indication: bacteremia Requesting/ordering  physician: Marrianne Mood PA  Procedure:  5cc of viscous lidocaine were given orally to provide local anesthesia to the oropharynx. The patient was positioned supine on the left side, bite block provided. The patient was moderately sedated with the doses of versed and fentanyl as detailed below.  Using digital technique an omniplane probe was advanced into the esophagus without incident.   Moderate sedation: 1. Sedation used:  Versed: 2mg , Fentanyl: 50mg  2. Time administered: 8:31am    Time when patient started recovery:8:41am 3. I was face to face during this time, 9 mins  See report in EPIC  for complete details: In brief, imaging revealed normal LV function with no RWMAs and no mural apical thrombus. There was no evidence of endocarditis.  Estimated ejection fraction was 60%.  Right sided cardiac chambers were normal    The LA was well visualized in orthogonal views. There was no spontaneous contrast and no thrombus in the LA and LA appendage   The descending thoracic aorta had no  mural aortic debris with no evidence of aneurysmal dilation or disection   Aaron Edelman [place] 10/15/2019 8:47 AM

## 2019-10-15 NOTE — TOC Progression Note (Signed)
Transition of Care [facility]) - Progression Note    Patient Details  Name: Cathy Hughes MRN: [id number] Date of Birth: July 01, 1961  Transition of Care [facility]) CM/SW Contact  Su Hilt, RN Phone Number: 10/15/2019, 2:17 PM  Clinical Narrative:    Per Marye Round with [facility] they are not able to start care on this patient until 2/10 due to staffing with Nursing.  The patient will need nursing care before that time.  She is attempting to find a replacement for the patient to have IV care at home without success, I notified Zack the director of the [facility] department enlisting help to find [facility] RN, the problem is staffing     Expected Discharge Plan: [place] Barriers to Discharge: Continued Medical Work up  Expected Discharge Plan and Services Expected Discharge Plan: [place]   Discharge Planning Services: Other - See comment(glucometer, IV ABX, [facility])   Living arrangements for the past 2 months: Single Family Home                 DME Arranged: Glucometer DME Agency: [employer] Date DME Agency Contacted: 10/14/19 Time DME Agency Contacted: 1500 Representative spoke with at DME Agency: [place]: PT, OT, Nurse's Aide [place] Agency: [facility] Date [place]: 10/14/19 Time [place]: 1504 Representative spoke with at [place]: [place] ([place]) Interventions    Readmission Risk Interventions No flowsheet data found.

## 2019-10-15 NOTE — TOC Progression Note (Signed)
Transition of Care [facility]) - Progression Note    Patient Details  Name: PENDA COBLENTZ MRN: [id number] Date of Birth: June 04, 1961  Transition of Care [facility]) CM/SW Contact  Su Hilt, RN Phone Number: 10/15/2019, 3:02 PM  Clinical Narrative:    Provided the patient with a Glucometer from our charity foundation took to the room for her.   Expected Discharge Plan: [place] Barriers to Discharge: Continued Medical Work up  Expected Discharge Plan and Services Expected Discharge Plan: [place]   Discharge Planning Services: Other - See comment(glucometer, IV ABX, [facility])   Living arrangements for the past 2 months: Single Family Home                 DME Arranged: Glucometer DME Agency: [employer] Date DME Agency Contacted: 10/14/19 Time DME Agency Contacted: 1500 Representative spoke with at DME Agency: [place]: PT, OT, Nurse's Aide [place] Agency: [facility] Date [place]: 10/14/19 Time [place]: 1504 Representative spoke with at [place]: [place] ([place]) Interventions    Readmission Risk Interventions No flowsheet data found.

## 2019-10-16 DIAGNOSIS — B370 Candidal stomatitis: Secondary | ICD-10-CM

## 2019-10-16 LAB — GLUCOSE, CAPILLARY
Glucose-Capillary: 103 mg/dL — ABNORMAL HIGH (ref 70–99)
Glucose-Capillary: 114 mg/dL — ABNORMAL HIGH (ref 70–99)
Glucose-Capillary: 175 mg/dL — ABNORMAL HIGH (ref 70–99)

## 2019-10-16 LAB — CBC WITH DIFFERENTIAL/PLATELET
Abs Immature Granulocytes: 0.05 10*3/uL (ref 0.00–0.07)
Basophils Absolute: 0 10*3/uL (ref 0.0–0.1)
Basophils Relative: 0 %
Eosinophils Absolute: 0.2 10*3/uL (ref 0.0–0.5)
Eosinophils Relative: 2 %
HCT: 36 % (ref 36.0–46.0)
Hemoglobin: 11.6 g/dL — ABNORMAL LOW (ref 12.0–15.0)
Immature Granulocytes: 0 %
Lymphocytes Relative: 21 %
Lymphs Abs: 2.5 10*3/uL (ref 0.7–4.0)
MCH: 27.4 pg (ref 26.0–34.0)
MCHC: 32.2 g/dL (ref 30.0–36.0)
MCV: 84.9 fL (ref 80.0–100.0)
Monocytes Absolute: 0.6 10*3/uL (ref 0.1–1.0)
Monocytes Relative: 6 %
Neutro Abs: 8.3 10*3/uL — ABNORMAL HIGH (ref 1.7–7.7)
Neutrophils Relative %: 71 %
Platelets: 313 10*3/uL (ref 150–400)
RBC: 4.24 MIL/uL (ref 3.87–5.11)
RDW: 15.6 % — ABNORMAL HIGH (ref 11.5–15.5)
WBC: 11.7 10*3/uL — ABNORMAL HIGH (ref 4.0–10.5)
nRBC: 0 % (ref 0.0–0.2)

## 2019-10-16 LAB — BASIC METABOLIC PANEL
Anion gap: 6 (ref 5–15)
BUN: 8 mg/dL (ref 6–20)
CO2: 28 mmol/L (ref 22–32)
Calcium: 8.9 mg/dL (ref 8.9–10.3)
Chloride: 110 mmol/L (ref 98–111)
Creatinine, Ser: 0.66 mg/dL (ref 0.44–1.00)
GFR calc Af Amer: >60 mL/min (ref >60)
GFR calc non Af Amer: >60 mL/min (ref >60)
Glucose, Bld: 114 mg/dL — ABNORMAL HIGH (ref 70–99)
Potassium: 3.7 mmol/L (ref 3.5–5.1)
Sodium: 144 mmol/L (ref 135–145)

## 2019-10-16 LAB — MAGNESIUM: Magnesium: 2.3 mg/dL (ref 1.7–2.4)

## 2019-10-16 MED ORDER — MECLIZINE HCL 12.5 MG PO TABS: 12.5000 mg | ORAL_TABLET | Freq: Three times a day (TID) | ORAL | 0 refills | Status: AC | PRN

## 2019-10-16 MED ORDER — MECLIZINE HCL 12.5 MG PO TABS
12.5000 mg | ORAL_TABLET | Freq: Three times a day (TID) | ORAL | Status: DC | PRN
  Filled 2019-10-16 (×2): qty 1

## 2019-10-16 MED ORDER — AMLODIPINE BESYLATE 10 MG PO TABS: 5.0000 mg | ORAL_TABLET | Freq: Every day | ORAL | 0 refills | Status: AC

## 2019-10-16 NOTE — Progress Notes (Signed)
Patient refuses bed alarm. Patient educated on purpose of alarms.

## 2019-10-16 NOTE — Progress Notes (Signed)
Patient discharged home per MD order. Prescriptions given to patient. All discharge instructions given to patient and husband and all questions answered.

## 2019-10-16 NOTE — Consult Note (Signed)
PHARMACY CONSULT NOTE - FOLLOW UP  Pharmacy Consult for Electrolyte Monitoring and Replacement   Recent Labs: Potassium (mmol/L)  Date Value  10/16/2019 3.7   Magnesium (mg/dL)  Date Value  10/16/2019 2.3   Calcium (mg/dL)  Date Value  10/16/2019 8.9   Albumin (g/dL)  Date Value  10/14/2019 2.9 (L)   Phosphorus (mg/dL)  Date Value  10/15/2019 2.6   Sodium (mmol/L)  Date Value  10/16/2019 144   Corrected Ca: 9.48 mg/dL  Assessment: Pharmacy has been consulted to manage electrolytes for this 59 year-old female admitted with pneumococcal bacteremia/bacterial meningitis.  Patient has a PMH significant for prediabetes, hypertension, and COPD.  Renal function has improved and at what appears to be her baseline. Patient was extubated 1/29.    Electrolytes Goal:  All electrolytes WNL Electrolytes wnl's - no replenishment warranted at this time  Glucose:  BG 57-175 over last 24 hrs.  A1c 8.2.   Total SSI required over the previous 24 hours: 2 units on Novolog 0-15 units mealtimes and bedtime  Levemir dc'ed and glipizide 5mg  bid started  Bowel Regimen LBM 2/2   Patient stable and out of ICU - pharmacy will sign off now  Lu Duffel, PharmD, BCPS Clinical Pharmacist 10/16/2019 7:15 AM

## 2019-10-16 NOTE — Progress Notes (Signed)
   Oxygen Qualification Note SaO2 on room air at rest = 97% SaO2 on room air while standing and marching= 94%  Lieutenant Diego PT, DPT 9:11 AM,10/16/19

## 2019-10-16 NOTE — Progress Notes (Signed)
Physical Therapy Treatment Patient Details Name: Cathy Hughes MRN: [id number] DOB: 03/09/1961 Today's Date: 10/16/2019    History of Present Illness 59 yo female admitted with headache s/p oxycodone, became minimally responsive after receiving narcotic and benzodiazepine low dose., Intubated 1/25 extubated 1/29. Work up + step pneumonia, streptococcal meningitis.    PT Comments    Patient alert, sitting EOB at start of session. Pt agitated/restless due to her desire to leave and go home. Pt performed sit <> stand with RW and minA. Pt endorsed dizziness throughout mobility. Standing marching performed, pt experienced LOB that needed modA to correct, returned to sitting. Despite encouragement and informed ambulation can be attempted with two people pt refused ambulation in room at this time due to fatigue and being "worn out". The patient left sitting with RN at end of session. Significant time spent discussing safety, activity pacing, and potential benefit of further care, pt determined to go home. The patient would benefit from further skilled PT intervention to maximize safety, independence, and mobility.   Oxygen monitored throughout mobility, >90% on room air.     Follow Up Recommendations  CIR     Equipment Recommendations  Rolling walker with 5" wheels;Other (comment);3in1 (PT)    Recommendations for Other Services       Precautions / Restrictions Precautions Precautions: Fall Restrictions Weight Bearing Restrictions: No    Mobility  Bed Mobility               General bed mobility comments: Pt sitting EOB at start of session  Transfers Overall transfer level: Needs assistance Equipment used: Rolling walker (2 wheeled) Transfers: Sit to/from Stand Sit to Stand: Min assist         General transfer comment: heavy reliance on RW to perform  Ambulation/Gait             General Gait Details: Deferred due to safety concerns, pt performed standing marching  and experienced LOB with minA to correct   Stairs             Wheelchair Mobility    Modified Rankin (Stroke Patients Only)       Balance Overall balance assessment: Needs assistance Sitting-balance support: Feet supported Sitting balance-Leahy Scale: Fair                                      Cognition Arousal/Alertness: Awake/alert Behavior During Therapy: Restless;Agitated Overall Cognitive Status: Within Functional Limits for tasks assessed                                        Exercises      General Comments        Pertinent Vitals/Pain Pain Assessment: No/denies pain    Home Living                      Prior Function            PT Goals (current goals can now be found in the care plan section) Progress towards PT goals: Progressing toward goals    Frequency    Min 2X/week      PT Plan Current plan remains appropriate    Co-evaluation              AM-PAC PT "6 Clicks" Mobility   Outcome  Measure  Help needed turning from your back to your side while in a flat bed without using bedrails?: A Lot Help needed moving from lying on your back to sitting on the side of a flat bed without using bedrails?: A Lot Help needed moving to and from a bed to a chair (including a wheelchair)?: A Lot Help needed standing up from a chair using your arms (e.g., wheelchair or bedside chair)?: A Little Help needed to walk in hospital room?: A Lot Help needed climbing 3-5 steps with a railing? : Total 6 Click Score: 12    End of Session Equipment Utilized During Treatment: Gait belt Activity Tolerance: Patient limited by fatigue;Other (comment)(limited by dizziness) Patient left: in bed;with family/visitor present Nurse Communication: Mobility status PT Visit Diagnosis: Other abnormalities of gait and mobility (R26.89);Difficulty in walking, not elsewhere classified (R26.2);Unsteadiness on feet (R26.81)      Time: [id number] PT Time Calculation (min) (ACUTE ONLY): 23 min  Charges:  $Therapeutic Exercise: 23-37 mins                    Lieutenant Diego PT, DPT 9:20 AM,10/16/19

## 2019-10-16 NOTE — TOC Progression Note (Signed)
Transition of Care [facility]) - Progression Note    Patient Details  Name: Cathy Hughes MRN: [id number] Date of Birth: Dec 11, 1960  Transition of Care [facility]) CM/SW [place], RN Phone Number: 10/16/2019, 8:38 AM  Clinical Narrative:    Damaris Schooner with Brad with [employer] to inquire about home Oxygen and Nebulizer, he stated that he will have the DME here around 10 AM, I spoke with the husband on the phone and the patient in the room and let them know, the husband will go to Medication mgt and pick up another medication that the physician sent electronically this morning   Expected Discharge Plan: [place] Barriers to Discharge: Continued Medical Work up  Expected Discharge Plan and Services Expected Discharge Plan: [place]   Discharge Planning Services: Other - See comment(glucometer, IV ABX, [facility])   Living arrangements for the past 2 months: [place] Expected Discharge Date: 10/16/19               DME Arranged: Glucometer DME Agency: [employer] Date DME Agency Contacted: 10/14/19 Time DME Agency Contacted: 1500 Representative spoke with at DME Agency: [place]: PT, OT, Nurse's Aide [place] Agency: [facility] Date [place]: 10/14/19 Time [place]: 1504 Representative spoke with at [place]: [place] ([place]) Interventions    Readmission Risk Interventions No flowsheet data found.

## 2019-10-16 NOTE — Progress Notes (Signed)
Subjective: No complaints of headache today.  Ready to go home.  Continues with some right sided weakness  Objective: Current vital signs: BP (!) 151/95 (BP Location: Right Arm)   Pulse 70   Temp 98.3 F (36.8 C) (Oral)   Resp 20   Ht '5\' 5"'$  (1.651 m)   Wt 99.1 kg   LMP 09/30/2014   SpO2 98%   BMI 36.36 kg/m  Vital signs in last 24 hours: Temp:  [98.3 F (36.8 C)] 98.3 F (36.8 C) (02/04 0040) Pulse Rate:  [70-77] 70 (02/04 0040) Resp:  [18-20] 20 (02/04 0040) BP: (108-151)/(95-96) 151/95 (02/04 0040) SpO2:  [97 %-100 %] 98 % (02/04 0800) Weight:  [99.1 kg] 99.1 kg (02/04 0617)  Intake/Output from previous day: 02/03 0701 - 02/04 0700 In: 240 [P.O.:240] Out: -  Intake/Output this shift: No intake/output data recorded. Nutritional status:  Diet Order            DIET DYS 3 Room service appropriate? Yes with Assist; Fluid consistency: Thin  Diet effective now              Neurologic Exam: Mental Status: Alert, oriented, thought content appropriate.  Speech fluent without evidence of aphasia.  Able to follow 3 step commands without difficulty. Cranial Nerves: II: Visual fields grossly normal, pupils equal, round, reactive to light and accommodation III,IV, VI: mild right ptosis, extra-ocular motions intact bilaterally V,VII: smile symmetric, facial light touch sensation normal bilaterally VIII: hearing normal bilaterally IX,X: gag reflex present XI: bilateral shoulder shrug XII: midline tongue extension Motor: Patient able to lift both upper extremities against gravity.  Reports pain with the RUE.  Drift noted with the RUE without pronations.  5-/5 right hip flexor strength, 5/5 LLE strength Sensory: Pinprick and light touch intact throughout, bilaterally  Lab Results: Basic Metabolic Panel: Recent Labs  Lab 10/11/19 0607 10/11/19 1953 10/12/19 0557 10/12/19 0557 10/13/19 0313 10/13/19 0313 10/14/19 0936 10/15/19 0408 10/16/19 0339  NA   < >  --  153*   --  150*  --  143  144 143 144  K   < > 3.3* 3.2*  --  3.6  --  3.1*  3.1* 3.4* 3.7  CL   < >  --  111  --  111  --  106  107 108 110  CO2   < >  --  32  --  30  --  '26  27 24 28  '$ GLUCOSE   < >  --  101*  --  129*  --  155*  157* 126* 114*  BUN   < >  --  26*  --  18  --  '12  12 10 8  '$ CREATININE   < >  --  0.76  --  0.75  --  0.70  0.74 0.76 0.66  CALCIUM   < >  --  8.6*   < > 8.8*   < > 8.4*  8.5* 8.6* 8.9  MG   < >  --  2.6*  --  2.7*  --  2.3 2.5* 2.3  PHOS  --  3.9 3.4  --  2.2*  --  2.1* 2.6  --    < > = values in this interval not displayed.    Liver Function Tests: Recent Labs  Lab 10/14/19 0936  ALBUMIN 2.9*   No results for input(s): LIPASE, AMYLASE in the last 168 hours. No results for input(s): AMMONIA in the last 168  hours.  CBC: Recent Labs  Lab 10/12/19 0557 10/13/19 0313 10/14/19 0936 10/15/19 0408 10/16/19 0339  WBC 12.6* 12.0* 11.0* 10.7* 11.7*  NEUTROABS 8.7* 8.7* 7.2 7.1 8.3*  HGB 12.2 12.0 11.7* 12.1 11.6*  HCT 37.9 37.3 35.8* 37.9 36.0  MCV 85.6 85.9 83.8 84.8 84.9  PLT 337 245 294 311 313    Cardiac Enzymes: No results for input(s): CKTOTAL, CKMB, CKMBINDEX, TROPONINI in the last 168 hours.  Lipid Panel: No results for input(s): CHOL, TRIG, HDL, CHOLHDL, VLDL, LDLCALC in the last 168 hours.  CBG: Recent Labs  Lab 10/15/19 1658 10/15/19 2017 10/16/19 0043 10/16/19 0343 10/16/19 0824  GLUCAP 117* 57* 175* 103* 114*    Microbiology: Results for orders placed or performed during the hospital encounter of 10/05/19  Culture, blood (routine x 2)     Status: Abnormal   Collection Time: 10/06/19 12:13 AM   Specimen: BLOOD  Result Value Ref Range Status   Specimen Description   Final    BLOOD LEFT HAND Performed at [facility], [address] [place], [place] [address]    Special Requests   Final    BOTTLES DRAWN AEROBIC AND ANAEROBIC Blood Culture results may not be optimal due to an inadequate volume of blood received  in culture bottles Performed at [facility], [address], [place], [place] [address]    Culture  Setup Time   Final    GRAM POSITIVE COCCI IN BOTH AEROBIC AND ANAEROBIC BOTTLES CRITICAL RESULT CALLED TO, READ BACK BY AND VERIFIED WITH: Rito Ehrlich ON 10/06/19 AT 1225 QSD Performed at [facility], [place] [address], [place], [place] [address]    Culture STREPTOCOCCUS PNEUMONIAE (A)  Final   Report Status 10/08/2019 FINAL  Final   Organism ID, Bacteria STREPTOCOCCUS PNEUMONIAE  Final      Susceptibility   Streptococcus pneumoniae - MIC*    ERYTHROMYCIN >=8 RESISTANT Resistant     LEVOFLOXACIN 0.5 SENSITIVE Sensitive     VANCOMYCIN 0.25 SENSITIVE Sensitive     PENICILLIN (non-meningitis) 0.25 SENSITIVE Sensitive     PENICILLIN (oral) 0.25 INTERMEDIATE Intermediate     CEFTRIAXONE (non-meningitis) 0.25 SENSITIVE Sensitive     * STREPTOCOCCUS PNEUMONIAE  Culture, blood (routine x 2)     Status: Abnormal   Collection Time: 10/06/19 12:13 AM   Specimen: BLOOD  Result Value Ref Range Status   Specimen Description   Final    BLOOD RIGHT HAND Performed at [facility], [address] [place], [place] [address]    Special Requests   Final    BOTTLES DRAWN AEROBIC AND ANAEROBIC Blood Culture results may not be optimal due to an inadequate volume of blood received in culture bottles Performed at [facility], [address] [place], [place] [address]    Culture  Setup Time   Final    GRAM POSITIVE COCCI IN BOTH AEROBIC AND ANAEROBIC BOTTLES CRITICAL RESULT CALLED TO, READ BACK BY AND VERIFIED WITH: LISA KLUTTZ ON 10/06/19 AT 1032 QSD Performed at [facility], [address] [place], [place] [address]    Culture (A)  Final    STREPTOCOCCUS PNEUMONIAE SUSCEPTIBILITIES PERFORMED ON PREVIOUS CULTURE WITHIN THE LAST 5 DAYS. Performed at [facility], [place] [address] [place], [place] [address]    Report Status 10/08/2019 FINAL  Final   Blood Culture ID Panel (Reflexed)     Status: Abnormal   Collection Time: 10/06/19 12:13 AM  Result Value  Ref Range Status   Enterococcus species NOT DETECTED NOT DETECTED Final   Listeria monocytogenes NOT DETECTED NOT DETECTED Final   Staphylococcus species NOT DETECTED NOT DETECTED Final   Staphylococcus aureus (BCID) NOT DETECTED NOT DETECTED Final   Streptococcus species DETECTED (A) NOT DETECTED Final    Comment: CRITICAL RESULT CALLED TO, READ BACK BY AND VERIFIED WITH: WALID NAZARI ON 10/06/19 AT 1225 QSD    Streptococcus agalactiae NOT DETECTED NOT DETECTED Final   Streptococcus pneumoniae DETECTED (A) NOT DETECTED Final    Comment: CRITICAL RESULT CALLED TO, READ BACK BY AND VERIFIED WITH: WALID NAZARI ON 10/06/19 AT 1225 QSD    Streptococcus pyogenes NOT DETECTED NOT DETECTED Final   Acinetobacter baumannii NOT DETECTED NOT DETECTED Final   Enterobacteriaceae species NOT DETECTED NOT DETECTED Final   Enterobacter cloacae complex NOT DETECTED NOT DETECTED Final   Escherichia coli NOT DETECTED NOT DETECTED Final   Klebsiella oxytoca NOT DETECTED NOT DETECTED Final   Klebsiella pneumoniae NOT DETECTED NOT DETECTED Final   Proteus species NOT DETECTED NOT DETECTED Final   Serratia marcescens NOT DETECTED NOT DETECTED Final   Haemophilus influenzae NOT DETECTED NOT DETECTED Final   Neisseria meningitidis NOT DETECTED NOT DETECTED Final   Pseudomonas aeruginosa NOT DETECTED NOT DETECTED Final   Candida albicans NOT DETECTED NOT DETECTED Final   Candida glabrata NOT DETECTED NOT DETECTED Final   Candida krusei NOT DETECTED NOT DETECTED Final   Candida parapsilosis NOT DETECTED NOT DETECTED Final   Candida tropicalis NOT DETECTED NOT DETECTED Final    Comment: Performed at [facility], [address] [place] [address], [place], [place] [address]  Urine Culture     Status: None   Collection Time: 10/06/19 12:51 AM   Specimen: Urine, Random  Result Value Ref Range Status   Specimen  Description   Final    URINE, RANDOM Performed at [facility], [address] [place], [place] [address]    Special Requests   Final    NONE Performed at [facility], [address] [place] [address], [place], [place] [address]    Culture   Final    NO GROWTH Performed at [facility], [place] [address], [place],  [address]    Report Status 10/07/2019 FINAL  Final  SARS CORONAVIRUS 2 (TAT 6-24 HRS) Nasopharyngeal Nasopharyngeal Swab     Status: None   Collection Time: 10/06/19  2:30 AM   Specimen: Nasopharyngeal Swab  Result Value Ref Range Status   SARS Coronavirus 2 NEGATIVE NEGATIVE Final    Comment: (NOTE) SARS-CoV-2 target nucleic acids are NOT DETECTED. The SARS-CoV-2 RNA is generally detectable in upper and lower respiratory specimens during the acute phase of infection. Negative results do not preclude SARS-CoV-2 infection, do not rule out co-infections with other pathogens, and should not be used as the sole basis for treatment or other patient management decisions. Negative results must be combined with clinical observations, patient history, and epidemiological information. The expected result is Negative. Fact Sheet for Patients: SugarRoll.be Fact Sheet for Healthcare Providers: https://www.woods-mathews.com/ This test is not yet approved or cleared by the [place] FDA and  has been authorized for detection and/or diagnosis of SARS-CoV-2 by FDA under an Emergency Use Authorization (EUA). This EUA will remain  in effect (meaning this test can be used) for the duration of the COVID-19 declaration under Section 56 4(b)(1) of the Act, 21 U.S.C. section 360bbb-3(b)(1), unless the authorization is terminated or revoked sooner. Performed at [facility], [address] [place], [place] [address]   Respiratory Panel by PCR     Status: None   Collection Time: 10/06/19  3:40 AM   Specimen: Nasopharyngeal  Swab; Respiratory  Result Value Ref Range Status   Adenovirus NOT DETECTED NOT DETECTED Final   Coronavirus 229E NOT DETECTED NOT DETECTED Final    Comment: (NOTE) The Coronavirus on the Respiratory Panel, DOES NOT test for the novel  Coronavirus (2019 nCoV)    Coronavirus HKU1 NOT DETECTED NOT DETECTED Final   Coronavirus NL63 NOT DETECTED NOT DETECTED Final   Coronavirus OC43 NOT DETECTED NOT DETECTED Final   Metapneumovirus NOT DETECTED NOT DETECTED Final   Rhinovirus / Enterovirus NOT DETECTED NOT DETECTED Final   Influenza A NOT DETECTED NOT DETECTED Final   Influenza B NOT DETECTED NOT DETECTED Final   Parainfluenza Virus 1 NOT DETECTED NOT DETECTED Final   Parainfluenza Virus 2 NOT DETECTED NOT DETECTED Final   Parainfluenza Virus 3 NOT DETECTED NOT DETECTED Final   Parainfluenza Virus 4 NOT DETECTED NOT DETECTED Final   Respiratory Syncytial Virus NOT DETECTED NOT DETECTED Final   Bordetella pertussis NOT DETECTED NOT DETECTED Final   Chlamydophila pneumoniae NOT DETECTED NOT DETECTED Final   Mycoplasma pneumoniae NOT DETECTED NOT DETECTED Final    Comment: Performed at [facility], [place], [place], [place] [address]  MRSA PCR Screening     Status: None   Collection Time: 10/06/19  3:41 AM   Specimen: Nasopharyngeal  Result Value Ref Range Status   MRSA by PCR NEGATIVE NEGATIVE Final    Comment:        The GeneXpert MRSA Assay (FDA approved for NASAL specimens only), is one component of a comprehensive MRSA colonization surveillance program. It is not intended to diagnose MRSA infection nor to guide or monitor treatment for MRSA infections. Performed at [facility], [address] [place], [place] [address]   CULTURE, BLOOD (ROUTINE X 2) w Reflex to ID Panel     Status: None   Collection Time: 10/07/19  1:17 AM   Specimen: BLOOD  Result Value Ref Range Status   Specimen Description BLOOD LEFT HAND  Final   Special Requests   Final     BOTTLES DRAWN AEROBIC AND ANAEROBIC Blood Culture adequate volume   Culture   Final    NO GROWTH 5 DAYS Performed at [facility], [address] [place], [place] [address]    Report Status 10/12/2019 FINAL  Final  CULTURE, BLOOD (ROUTINE X 2) w Reflex to ID Panel     Status: None   Collection Time: 10/07/19  1:17 AM   Specimen: BLOOD  Result Value Ref Range Status   Specimen Description BLOOD LEFT WRIST  Final   Special Requests   Final    BOTTLES DRAWN AEROBIC AND ANAEROBIC Blood Culture adequate volume   Culture   Final    NO GROWTH 5 DAYS Performed at [facility], [address], [place], [place] [address]    Report Status 10/12/2019 FINAL  Final  CSF culture     Status: None   Collection Time: 10/07/19  1:22 PM   Specimen: PATH Cytology CSF; Cerebrospinal Fluid  Result Value Ref Range Status   Specimen Description   Final    CSF Performed at [facility], [address], [place], [place] [address]    Special Requests   Final    NONE Performed at [facility],  [place], [place] [address]    Gram Stain   Final    NO ORGANISMS SEEN WBC SEEN RED BLOOD CELLS RESULT CALLED TO, READ BACK BY AND VERIFIED WITH: MEGAN SCHEFFIELD 10/07/19 @ [place] Performed at [facility], [address], [place], [place] [address]    Culture   Final    NO GROWTH 3 DAYS Performed at [facility], [place] [address], [place], [place] [address]    Report Status 10/11/2019 FINAL  Final  Fungus Culture With Stain     Status: None (Preliminary result)   Collection Time: 10/07/19  1:22 PM   Specimen: PATH Cytology CSF; Cerebrospinal Fluid  Result Value Ref Range Status   Fungus Stain Final report  Final    Comment: (NOTE) Performed At: [facility] [place], [place] [id number] Rush Farmer MD IO:[id number]    Fungus (Mycology) Culture PENDING  Incomplete   Fungal Source CSF  Final    Comment:  Performed at [facility], [address] [place], [place] [address]  Fungus Culture Result     Status: None   Collection Time: 10/07/19  1:22 PM  Result Value Ref Range Status   Result 1 Comment  Final    Comment: (NOTE) KOH/Calcofluor preparation:  no fungus observed. Performed At: [facility] [place], [place] [id number] Rush Farmer MD GX:[id number]     Coagulation Studies: No results for input(s): LABPROT, INR in the last 72 hours.  Imaging: MR BRAIN WO CONTRAST  Result Date: 10/15/2019 CLINICAL DATA:  Bacterial meningitis EXAM: MRI HEAD WITHOUT CONTRAST TECHNIQUE: Multiplanar, multiecho pulse sequences of the brain and surrounding structures were obtained without intravenous contrast. COMPARISON:  CT head 10/13/2019 FINDINGS: Brain: Image quality degraded by moderate to extensive motion. Multiple sequences repeated. Negative for acute infarct. Scattered small white matter hyperintensities bilaterally appear chronic. Brainstem intact. Negative for hemorrhage, mass, or fluid collection. No midline shift or hydrocephalus Vascular: Normal arterial flow voids. Skull and upper cervical spine: Negative Sinuses/Orbits: Mild mucosal edema left ethmoid sinus otherwise sinuses clear. Bilateral mastoid effusion. Negative orbit Other: None IMPRESSION: Image quality degraded by significant motion. No acute abnormality. Electronically Signed   By: Franchot Gallo M.D.   On: 10/15/2019 12:33   ECHO TEE  Result Date: 10/15/2019   TRANSESOPHOGEAL ECHO REPORT   Patient Name:   Cathy Hughes Date of Exam: 10/15/2019 Medical Rec #:  [id number]        Height:       65.0 in Accession #:    [id number]       Weight:       226.2 lb Date of Birth:  13-Aug-1961         BSA:          2.08 m Patient Age:    38 years         BP:           153/82 mmHg Patient Gender: F                HR:           80 bpm. Exam Location:  [facility]  Procedure: Transesophageal Echo, Color Doppler and Cardiac  Doppler Indications:     Bacteremia 790.7  History:         Patient has prior history of Echocardiogram examinations, most  recent 10/07/2019.  Sonographer:     Sherrie Sport RDCS (AE) Referring Phys:  [id number] Arvil Chaco Diagnosing Phys: Kate Sable MD  PROCEDURE: The transesophogeal probe was passed through the esophogus of the patient. The patient developed no complications during the procedure. TEE SUMMARY  No evidence for vegetation  IMPRESSIONS  1. Left ventricular ejection fraction, by visual estimation, is 60 to 65%. The left ventricle has normal function. There is no left ventricular hypertrophy.  2. The left ventricle has no regional wall motion abnormalities.  3. Global right ventricle has normal systolic function.The right ventricular size is normal. No increase in right ventricular wall thickness.  4. Left atrial size was normal.  5. Right atrial size was normal.  6. The mitral valve is normal in structure. Trivial mitral valve regurgitation.  7. The tricuspid valve is normal in structure.  8. The tricuspid valve is normal in structure. Tricuspid valve regurgitation is not demonstrated.  9. The aortic valve is normal in structure. Aortic valve regurgitation is not visualized. 10. The pulmonic valve was not well visualized. Pulmonic valve regurgitation is not visualized. FINDINGS  Left Ventricle: Left ventricular ejection fraction, by visual estimation, is 60 to 65%. The left ventricle has normal function. The left ventricle has no regional wall motion abnormalities. There is no left ventricular hypertrophy. Right Ventricle: The right ventricular size is normal. No increase in right ventricular wall thickness. Global RV systolic function is has normal systolic function. Left Atrium: Left atrial size was normal in size. Right Atrium: Right atrial size was normal in size Pericardium: There is no evidence of pericardial effusion. Mitral Valve: The mitral valve is normal in  structure. Trivial mitral valve regurgitation. Tricuspid Valve: The tricuspid valve is normal in structure. Tricuspid valve regurgitation is not demonstrated. Aortic Valve: The aortic valve is normal in structure. Aortic valve regurgitation is not visualized. Pulmonic Valve: The pulmonic valve was not well visualized. Pulmonic valve regurgitation is not visualized. Aorta: The aortic root is normal in size and structure. Venous: The inferior vena cava was not well visualized. Shunts: No atrial level shunt detected by color flow Doppler.  Kate Sable MD Electronically signed by Kate Sable MD Signature Date/Time: 10/15/2019/4:55:57 PM    Final     Medications:  I have reviewed the patient's current medications. Scheduled: . amLODipine  10 mg Oral Daily  . budesonide (PULMICORT) nebulizer solution  0.5 mg Nebulization BID  . chlorhexidine gluconate (MEDLINE KIT)  15 mL Mouth Rinse BID  . enoxaparin (LOVENOX) injection  40 mg Subcutaneous Daily  . fluticasone  2 spray Each Nare Daily  . glipiZIDE  5 mg Oral BID AC  . insulin aspart  0-15 Units Subcutaneous TID WC  . ipratropium-albuterol  3 mL Nebulization TID  . montelukast  10 mg Oral Daily  . nystatin  5 mL Oral QID  . pantoprazole  40 mg Oral Daily  . 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  Initially required intubation and sedation.  Maintained on Ceftriaxone, day #11.  Noted to have some right sided weakness. Repeat imaging included CT of head and CTA/CTV.  No evidence of abscess or other focal lesion and no noted arterial or venous thrombosis.  MRI repeated on yesterday without contrast.  Movement artifact noted but no acute abnormalities.  TEE showed no evidence of endocarditis.    Recommendations: 1. ID recommendations for antibiotics 2. Follow up with neurology  on an outpatient basis    LOS: 10 days   Alexis Goodell,  MD Neurology [phone] 10/16/2019  9:31 AM

## 2019-10-16 NOTE — Discharge Summary (Signed)
[facility] NAME: Cathy Hughes    MR#:  [id number]  [place] OF BIRTH:  [birth year]  DATE OF ADMISSION:  10/05/2019 ADMITTING PHYSICIAN: Athena Masse, MD  DATE OF DISCHARGE: 10/16/2019 10:30 AM  PRIMARY CARE PHYSICIAN: Donnie Coffin, MD    ADMISSION DIAGNOSIS:  Altered mental status [R41.82] Pain [R52] Sepsis ([place]) [A41.9] Altered mental status, unspecified altered mental status type [R41.82] Sepsis, due to unspecified organism, unspecified whether acute organ dysfunction present ([place]) [A41.9]  DISCHARGE DIAGNOSIS:  Principal Problem:   Sepsis ([place]) Active Problems:   HTN (hypertension)   COPD (chronic obstructive pulmonary disease) ([place])   Acute metabolic encephalopathy   Hyperglycemia due to type 2 diabetes mellitus ([place])   Left ear pain   Perennial allergic rhinitis   Tobacco abuse   Bacteremia due to Streptococcus   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 Asthma   . COPD (chronic obstructive pulmonary disease) ([place])   . Hypertension   . Pre-diabetes     HOSPITAL COURSE:   1.  Sepsis with meningitis and acute metabolic encephalopathy, secondary to Streptococcus pneumoniae.  Blood cultures were positive.  Echocardiogram negative for endocarditis.  MRI of the brain was negative for stroke or abscess.  Infectious disease recommended IV antibiotics Rocephin 2 g IV every 12 hours as outpatient through 10/22/2019.  Midline placed.  Home health set up for IV antibiotics.  Follow-up with Dr. Steva Ready infectious disease as outpatient. 2.  Right-sided weakness.  MRI of the brain was negative but did have motion degradation.  Likely secondary to infection.  On the day of discharge the patient did not walk much with physical therapy she just wanted to go home and get out of the hospital.  Physical therapy recommended acute rehab but the  patient wanted to go home. 3.  Acute metabolic encephalopathy.  This has improved. 4.  Hypernatremia.  This has resolved. 5.  Essential hypertension.  Restarted amlodipine.  I did prescribe 10 mg half tablet daily. 6.  COPD.  The patient did not qualify for home oxygen at this time.  Lungs are clear upon discharge home. 7.  Thrush on nystatin swish and swallow. 8.  Type 2 diabetes mellitus with hyperglycemia.  The patient did not want to be on insulin at home and glipizide 5 mg twice a day was started.  Patient was given a glucometer. 9.  Dizziness.  Trial of meclizine.  Decreased dose of Norvasc.  Patient did not let us check orthostatics prior to discharge.  DISCHARGE CONDITIONS:   Fair  CONSULTS OBTAINED:  Treatment Team:  Leotis Pain, MD  DRUG ALLERGIES:   Allergies  Allergen Reactions  . Aspirin Hives  . Tramadol Other (See Comments)    Headache     DISCHARGE MEDICATIONS:   Allergies as of 10/16/2019      Reactions   Aspirin Hives   Tramadol Other (See Comments)   Headache      Medication List    STOP taking these medications   oxyCODONE 5 MG immediate release tablet Commonly known as: Oxy IR/ROXICODONE   predniSONE 10 MG tablet Commonly known as: DELTASONE   tiZANidine 4 MG tablet Commonly known as: ZANAFLEX     TAKE these medications   albuterol 108 (90 Base) MCG/ACT inhaler Commonly known as: VENTOLIN HFA Inhale 2-4 puffs by mouth every 4 hours as  needed for wheezing, cough, and/or shortness of breath   amLODipine 10 MG tablet Commonly known as: NORVASC Take 0.5 tablets (5 mg total) by mouth daily. What changed: medication strength   cefTRIAXone  IVPB Commonly known as: ROCEPHIN Inject 2 g into the vein every 12 (twelve) hours for 7 days. Indication: Pneumococcal bacteremia and meningitis Last Day of Therapy: 10/22/2019 Labs - Once weekly (Mondays):  CBC/D and CMP   Ensure Max Protein Liqd Take 330 mLs (11 oz total) by mouth 2 (two) times  daily between meals.   fluticasone 50 MCG/ACT nasal spray Commonly known as: Flonase Place 2 sprays into both nostrils daily.   glipiZIDE 5 MG tablet Commonly known as: GLUCOTROL Take 1 tablet (5 mg total) by mouth 2 (two) times daily before a meal.   ipratropium-albuterol 0.5-2.5 (3) MG/3ML Soln Commonly known as: DUONEB Take 3 mLs by nebulization every 6 (six) hours as needed. Dx: J44.1 What changed: additional instructions   meclizine 12.5 MG tablet Commonly known as: ANTIVERT Take 1 tablet (12.5 mg total) by mouth 3 (three) times daily as needed for dizziness.   montelukast 10 MG tablet Commonly known as: SINGULAIR Take 1 tablet (10 mg total) by mouth daily.   nystatin 100000 UNIT/ML suspension Commonly known as: MYCOSTATIN Take 5 mLs (500,000 Units total) by mouth 4 (four) times daily.   omeprazole 20 MG capsule Commonly known as: PriLOSEC Take 1 capsule (20 mg total) by mouth 2 (two) times daily.   sodium chloride flush 0.9 % Soln Commonly known as: NS 10 mLs by Intracatheter route every 12 (twelve) hours.            Home Infusion Instuctions  (From admission, onward)         Start     Ordered   10/15/19 0000  Home infusion instructions    Question:  Instructions  Answer:  Flushing of vascular access device: 0.9% NaCl pre/post medication administration and prn patency; Heparin 100 u/ml, 26ml for implanted ports and Heparin 10u/ml, 82ml for all other central venous catheters.   10/15/19 1300           Durable Medical Equipment  (From admission, onward)         Start     Ordered   10/16/19 0949  For home use only DME Walker rolling  Once    Question Answer Comment  Walker: With [employer] Wheels   Patient needs a walker to treat with the following condition Weakness      10/16/19 0949   10/15/19 1346  DME Glucometer  Once     10/15/19 1345   10/14/19 1637  For home use only DME oxygen  Once    Question Answer Comment  Length of Need 6 Months   Mode  or (Route) Nasal cannula   Liters per Minute 3   Frequency Continuous (stationary and portable oxygen unit needed)   Oxygen delivery system Gas      10/14/19 1637   10/14/19 1637  For home use only DME Nebulizer machine  Once    Question Answer Comment  Patient needs a nebulizer to treat with the following condition COPD (chronic obstructive pulmonary disease) ([place])   Length of Need 6 Months      10/14/19 1637   10/14/19 1635  For home use only DME 3 n 1  Once     10/14/19 1637           DISCHARGE INSTRUCTIONS:   Follow-up PCP 5  days Follow-up infectious disease 6 days Home health set up  If you experience worsening of your admission symptoms, develop shortness of breath, life threatening emergency, suicidal or homicidal thoughts you must seek medical attention immediately by calling 911 or calling your MD immediately  if symptoms less severe.  You Must read complete instructions/literature along with all the possible adverse reactions/side effects for all the Medicines you take and that have been prescribed to you. Take any new Medicines after you have completely understood and accept all the possible adverse reactions/side effects.   Please note  You were cared for by a hospitalist during your hospital stay. If you have any questions about your discharge medications or the care you received while you were in the hospital after you are discharged, you can call the unit and asked to speak with the hospitalist on call if the hospitalist that took care of you is not available. Once you are discharged, your primary care physician will handle any further medical issues. Please note that NO REFILLS for any discharge medications will be authorized once you are discharged, as it is imperative that you return to your primary care physician (or establish a relationship with a primary care physician if you do not have one) for your aftercare needs so that they can reassess your need for  medications and monitor your lab values.    Today   CHIEF COMPLAINT:   Chief Complaint  Patient presents with  . Headache    HISTORY OF PRESENT ILLNESS:  Cathy Hughes  is a 59 y.o. female came in with headache   VITAL SIGNS:  Blood pressure 138/68, pulse 80, temperature 98.3 F (36.8 C), temperature source Oral, resp. rate (!) 24, height 5\' 5"  (1.651 m), weight 99.1 kg, last menstrual period 09/30/2014, SpO2 98 %.  I/O:    Intake/Output Summary (Last 24 hours) at 10/16/2019 1237 Last data filed at 10/16/2019 0900 Gross per 24 hour  Intake 600 ml  Output --  Net 600 ml    PHYSICAL EXAMINATION:  GENERAL:  59 y.o.-year-old patient lying in the bed with no acute distress.  EYES: Pupils equal, round, reactive to light and accommodation. No scleral icterus. Extraocular muscles intact.  HEENT: Head atraumatic, normocephalic. Oropharynx and nasopharynx clear.  NECK:  Supple, no jugular venous distention. No thyroid enlargement, no tenderness.  LUNGS: Normal breath sounds bilaterally, no wheezing, rales,rhonchi or crepitation. No use of accessory muscles of respiration.  CARDIOVASCULAR: S1, S2 normal. No murmurs, rubs, or gallops.  ABDOMEN: Soft, non-tender, non-distended. Bowel sounds present. No organomegaly or mass.  EXTREMITIES: No pedal edema, cyanosis, or clubbing.  NEUROLOGIC: Cranial nerves II through XII are intact. Muscle strength 4/5 in right upper and lower extremity.  5 out of 5 power left upper and lower extremity.. Sensation intact. Gait not checked.  PSYCHIATRIC: The patient is alert and oriented x 3.  SKIN: No obvious rash, lesion, or ulcer.   DATA REVIEW:   CBC Recent Labs  Lab 10/16/19 0339  WBC 11.7*  HGB 11.6*  HCT 36.0  PLT 313    Chemistries  Recent Labs  Lab 10/16/19 0339  NA 144  K 3.7  CL 110  CO2 28  GLUCOSE 114*  BUN 8  CREATININE 0.66  CALCIUM 8.9  MG 2.3    Microbiology Results  Results for orders placed or performed during  the hospital encounter of 10/05/19  Culture, blood (routine x 2)     Status: Abnormal   Collection Time: 10/06/19  12:13 AM   Specimen: BLOOD  Result Value Ref Range Status   Specimen Description   Final    BLOOD LEFT HAND Performed at [facility], [address] [place], [place] [address]    Special Requests   Final    BOTTLES DRAWN AEROBIC AND ANAEROBIC Blood Culture results may not be optimal due to an inadequate volume of blood received in culture bottles Performed at [facility], [address] [place], [place] [address]    Culture  Setup Time   Final    GRAM POSITIVE COCCI IN BOTH AEROBIC AND ANAEROBIC BOTTLES CRITICAL RESULT CALLED TO, READ BACK BY AND VERIFIED WITH: Rito Ehrlich ON 10/06/19 AT 1225 QSD Performed at [facility], [place] [address], [place], [place] [address]    Culture STREPTOCOCCUS PNEUMONIAE (A)  Final   Report Status 10/08/2019 FINAL  Final   Organism ID, Bacteria STREPTOCOCCUS PNEUMONIAE  Final      Susceptibility   Streptococcus pneumoniae - MIC*    ERYTHROMYCIN >=8 RESISTANT Resistant     LEVOFLOXACIN 0.5 SENSITIVE Sensitive     VANCOMYCIN 0.25 SENSITIVE Sensitive     PENICILLIN (non-meningitis) 0.25 SENSITIVE Sensitive     PENICILLIN (oral) 0.25 INTERMEDIATE Intermediate     CEFTRIAXONE (non-meningitis) 0.25 SENSITIVE Sensitive     * STREPTOCOCCUS PNEUMONIAE  Culture, blood (routine x 2)     Status: Abnormal   Collection Time: 10/06/19 12:13 AM   Specimen: BLOOD  Result Value Ref Range Status   Specimen Description   Final    BLOOD RIGHT HAND Performed at [facility], [address] [place], [place] [address]    Special Requests   Final    BOTTLES DRAWN AEROBIC AND ANAEROBIC Blood Culture results may not be optimal due to an inadequate volume of blood received in culture bottles Performed at [facility], [address] [place], [place] [address]    Culture  Setup Time   Final    GRAM POSITIVE  COCCI IN BOTH AEROBIC AND ANAEROBIC BOTTLES CRITICAL RESULT CALLED TO, READ BACK BY AND VERIFIED WITH: LISA KLUTTZ ON 10/06/19 AT 1032 QSD Performed at [facility], [address] [place], [place] [address]    Culture (A)  Final    STREPTOCOCCUS PNEUMONIAE SUSCEPTIBILITIES PERFORMED ON PREVIOUS CULTURE WITHIN THE LAST 5 DAYS. Performed at [facility], [place] [address], [place], [place] [address]    Report Status 10/08/2019 FINAL  Final  Blood Culture ID Panel (Reflexed)     Status: Abnormal   Collection Time: 10/06/19 12:13 AM  Result Value Ref Range Status   Enterococcus species NOT DETECTED NOT DETECTED Final   Listeria monocytogenes NOT DETECTED NOT DETECTED Final   Staphylococcus species NOT DETECTED NOT DETECTED Final   Staphylococcus aureus (BCID) NOT DETECTED NOT DETECTED Final   Streptococcus species DETECTED (A) NOT DETECTED Final    Comment: CRITICAL RESULT CALLED TO, READ BACK BY AND VERIFIED WITH: WALID NAZARI ON 10/06/19 AT 1225 QSD    Streptococcus agalactiae NOT DETECTED NOT DETECTED Final   Streptococcus pneumoniae DETECTED (A) NOT DETECTED Final    Comment: CRITICAL RESULT CALLED TO, READ BACK BY AND VERIFIED WITH: WALID NAZARI ON 10/06/19 AT 1225 QSD    Streptococcus pyogenes NOT DETECTED NOT DETECTED Final   Acinetobacter baumannii NOT DETECTED NOT DETECTED Final   Enterobacteriaceae species NOT DETECTED NOT DETECTED Final   Enterobacter cloacae complex NOT DETECTED NOT DETECTED Final   Escherichia coli  NOT DETECTED NOT DETECTED Final   Klebsiella oxytoca NOT DETECTED NOT DETECTED Final   Klebsiella pneumoniae NOT DETECTED NOT DETECTED Final   Proteus species NOT DETECTED NOT DETECTED Final   Serratia marcescens NOT DETECTED NOT DETECTED Final   Haemophilus influenzae NOT DETECTED NOT DETECTED Final   Neisseria meningitidis NOT DETECTED NOT DETECTED Final   Pseudomonas aeruginosa NOT DETECTED NOT DETECTED Final   Candida albicans NOT DETECTED  NOT DETECTED Final   Candida glabrata NOT DETECTED NOT DETECTED Final   Candida krusei NOT DETECTED NOT DETECTED Final   Candida parapsilosis NOT DETECTED NOT DETECTED Final   Candida tropicalis NOT DETECTED NOT DETECTED Final    Comment: Performed at [facility], [address], [place], [place] [address]  Urine Culture     Status: None   Collection Time: 10/06/19 12:51 AM   Specimen: Urine, Random  Result Value Ref Range Status   Specimen Description   Final    URINE, RANDOM Performed at [facility], [address], [place], [place] [address]    Special Requests   Final    NONE Performed at [facility], [address] [place], [place] [address]    Culture   Final    NO GROWTH Performed at [facility], [place] [address], [place], [place] [address]    Report Status 10/07/2019 FINAL  Final  SARS CORONAVIRUS 2 (TAT 6-24 HRS) Nasopharyngeal Nasopharyngeal Swab     Status: None   Collection Time: 10/06/19  2:30 AM   Specimen: Nasopharyngeal Swab  Result Value Ref Range Status   SARS Coronavirus 2 NEGATIVE NEGATIVE Final    Comment: (NOTE) SARS-CoV-2 target nucleic acids are NOT DETECTED. The SARS-CoV-2 RNA is generally detectable in upper and lower respiratory specimens during the acute phase of infection. Negative results do not preclude SARS-CoV-2 infection, do not rule out co-infections with other pathogens, and should not be used as the sole basis for treatment or other patient management decisions. Negative results must be combined with clinical observations, patient history, and epidemiological information. The expected result is Negative. Fact Sheet for Patients: SugarRoll.be Fact Sheet for Healthcare Providers: https://www.woods-mathews.com/ This test is not yet approved or cleared by the [place] FDA and  has been authorized for detection and/or diagnosis of SARS-CoV-2 by FDA under an  Emergency Use Authorization (EUA). This EUA will remain  in effect (meaning this test can be used) for the duration of the COVID-19 declaration under Section 56 4(b)(1) of the Act, 21 U.S.C. section 360bbb-3(b)(1), unless the authorization is terminated or revoked sooner. Performed at [facility], [place] [address] [place], [place] [address]   Respiratory Panel by PCR     Status: None   Collection Time: 10/06/19  3:40 AM   Specimen: Nasopharyngeal Swab; Respiratory  Result Value Ref Range Status   Adenovirus NOT DETECTED NOT DETECTED Final   Coronavirus 229E NOT DETECTED NOT DETECTED Final    Comment: (NOTE) The Coronavirus on the Respiratory Panel, DOES NOT test for the novel  Coronavirus (2019 nCoV)    Coronavirus HKU1 NOT DETECTED NOT DETECTED Final   Coronavirus NL63 NOT DETECTED NOT DETECTED Final   Coronavirus OC43 NOT DETECTED NOT DETECTED Final   Metapneumovirus NOT DETECTED NOT DETECTED Final   Rhinovirus / Enterovirus NOT DETECTED NOT DETECTED Final   Influenza A NOT DETECTED NOT DETECTED Final   Influenza B NOT DETECTED NOT DETECTED Final   Parainfluenza Virus 1 NOT DETECTED NOT DETECTED  Final   Parainfluenza Virus 2 NOT DETECTED NOT DETECTED Final   Parainfluenza Virus 3 NOT DETECTED NOT DETECTED Final   Parainfluenza Virus 4 NOT DETECTED NOT DETECTED Final   Respiratory Syncytial Virus NOT DETECTED NOT DETECTED Final   Bordetella pertussis NOT DETECTED NOT DETECTED Final   Chlamydophila pneumoniae NOT DETECTED NOT DETECTED Final   Mycoplasma pneumoniae NOT DETECTED NOT DETECTED Final    Comment: Performed at [facility], [place] [address], [place], [place] [address]  MRSA PCR Screening     Status: None   Collection Time: 10/06/19  3:41 AM   Specimen: Nasopharyngeal  Result Value Ref Range Status   MRSA by PCR NEGATIVE NEGATIVE Final    Comment:        The GeneXpert MRSA Assay (FDA approved for NASAL specimens only), is one component of a comprehensive  MRSA colonization surveillance program. It is not intended to diagnose MRSA infection nor to guide or monitor treatment for MRSA infections. Performed at [facility], [address] [place], [place] [address]   CULTURE, BLOOD (ROUTINE X 2) w Reflex to ID Panel     Status: None   Collection Time: 10/07/19  1:17 AM   Specimen: BLOOD  Result Value Ref Range Status   Specimen Description BLOOD LEFT HAND  Final   Special Requests   Final    BOTTLES DRAWN AEROBIC AND ANAEROBIC Blood Culture adequate volume   Culture   Final    NO GROWTH 5 DAYS Performed at [facility], [address] [place], [place] [address]    Report Status 10/12/2019 FINAL  Final  CULTURE, BLOOD (ROUTINE X 2) w Reflex to ID Panel     Status: None   Collection Time: 10/07/19  1:17 AM   Specimen: BLOOD  Result Value Ref Range Status   Specimen Description BLOOD LEFT WRIST  Final   Special Requests   Final    BOTTLES DRAWN AEROBIC AND ANAEROBIC Blood Culture adequate volume   Culture   Final    NO GROWTH 5 DAYS Performed at [facility] [address] [place], [place] [address]    Report Status 10/12/2019 FINAL  Final  CSF culture     Status: None   Collection Time: 10/07/19  1:22 PM   Specimen: PATH Cytology CSF; Cerebrospinal Fluid  Result Value Ref Range Status   Specimen Description   Final    CSF Performed at [facility], [address], [place], [place] [address]    Special Requests   Final    NONE Performed at [facility], [address] [place], [place] [address]    Gram Stain   Final    NO ORGANISMS SEEN WBC SEEN RED BLOOD CELLS RESULT CALLED TO, READ BACK BY AND VERIFIED WITH: MEGAN SCHEFFIELD 10/07/19 @ 1526  [place] Performed at [facility], [address] [place], [place] [address]    Culture   Final    NO GROWTH 3 DAYS Performed at [facility], [place] [address], [place],  [address]    Report Status 10/11/2019  FINAL  Final  Fungus Culture With Stain     Status: None (Preliminary result)   Collection Time: 10/07/19  1:22 PM   Specimen: PATH Cytology CSF; Cerebrospinal Fluid  Result Value Ref Range Status   Fungus Stain Final report  Final    Comment: (NOTE) Performed At: [facility] [address] [place], [place] [id number] Rush Farmer  MD [id number]    Fungus (Mycology) Culture PENDING  Incomplete   Fungal Source CSF  Final    Comment: Performed at [facility], [address] [place], [place] [address]  Fungus Culture Result     Status: None   Collection Time: 10/07/19  1:22 PM  Result Value Ref Range Status   Result 1 Comment  Final    Comment: (NOTE) KOH/Calcofluor preparation:  no fungus observed. Performed At: [facility] [place], [place] [id number] Rush Farmer MD [id number]     RADIOLOGY:  MR BRAIN WO CONTRAST  Result Date: 10/15/2019 CLINICAL DATA:  Bacterial meningitis EXAM: MRI HEAD WITHOUT CONTRAST TECHNIQUE: Multiplanar, multiecho pulse sequences of the brain and surrounding structures were obtained without intravenous contrast. COMPARISON:  CT head 10/13/2019 FINDINGS: Brain: Image quality degraded by moderate to extensive motion. Multiple sequences repeated. Negative for acute infarct. Scattered small white matter hyperintensities bilaterally appear chronic. Brainstem intact. Negative for hemorrhage, mass, or fluid collection. No midline shift or hydrocephalus Vascular: Normal arterial flow voids. Skull and upper cervical spine: Negative Sinuses/Orbits: Mild mucosal edema left ethmoid sinus otherwise sinuses clear. Bilateral mastoid effusion. Negative orbit Other: None IMPRESSION: Image quality degraded by significant motion. No acute abnormality. Electronically Signed   By: Franchot Gallo M.D.   On: 10/15/2019 12:33   ECHO TEE  Result Date: 10/15/2019   TRANSESOPHOGEAL ECHO REPORT   Patient Name:   Cathy Hughes Date of  Exam: 10/15/2019 Medical Rec #:  [id number]        Height:       65.0 in Accession #:    [id number]       Weight:       226.2 lb Date of Birth:  11-04-1960         BSA:          2.08 m Patient Age:    29 years         BP:           153/82 mmHg Patient Gender: F                HR:           80 bpm. Exam Location:  [facility]  Procedure: Transesophageal Echo, Color Doppler and Cardiac Doppler Indications:     Bacteremia 790.7  History:         Patient has prior history of Echocardiogram examinations, most                  recent 10/07/2019.  Sonographer:     Sherrie Sport RDCS (AE) Referring Phys:  [id number] Arvil Chaco Diagnosing Phys: Kate Sable MD  PROCEDURE: The transesophogeal probe was passed through the esophogus of the patient. The patient developed no complications during the procedure. TEE SUMMARY  No evidence for vegetation  IMPRESSIONS  1. Left ventricular ejection fraction, by visual estimation, is 60 to 65%. The left ventricle has normal function. There is no left ventricular hypertrophy.  2. The left ventricle has no regional wall motion abnormalities.  3. Global right ventricle has normal systolic function.The right ventricular size is normal. No increase in right ventricular wall thickness.  4. Left atrial size was normal.  5. Right atrial size was normal.  6. The mitral valve is normal in structure. Trivial mitral valve regurgitation.  7. The tricuspid valve is normal in structure.  8. The tricuspid valve is normal in structure. Tricuspid valve regurgitation is not demonstrated.  9. The  aortic valve is normal in structure. Aortic valve regurgitation is not visualized. 10. The pulmonic valve was not well visualized. Pulmonic valve regurgitation is not visualized. FINDINGS  Left Ventricle: Left ventricular ejection fraction, by visual estimation, is 60 to 65%. The left ventricle has normal function. The left ventricle has no regional wall motion abnormalities. There is no left ventricular hypertrophy.  Right Ventricle: The right ventricular size is normal. No increase in right ventricular wall thickness. Global RV systolic function is has normal systolic function. Left Atrium: Left atrial size was normal in size. Right Atrium: Right atrial size was normal in size Pericardium: There is no evidence of pericardial effusion. Mitral Valve: The mitral valve is normal in structure. Trivial mitral valve regurgitation. Tricuspid Valve: The tricuspid valve is normal in structure. Tricuspid valve regurgitation is not demonstrated. Aortic Valve: The aortic valve is normal in structure. Aortic valve regurgitation is not visualized. Pulmonic Valve: The pulmonic valve was not well visualized. Pulmonic valve regurgitation is not visualized. Aorta: The aortic root is normal in size and structure. Venous: The inferior vena cava was not well visualized. Shunts: No atrial level shunt detected by color flow Doppler.  Kate Sable MD Electronically signed by Kate Sable MD Signature Date/Time: 10/15/2019/4:55:57 PM    Final      Management plans discussed with the patient, and she is in agreement.  Spoke with patient's husband yesterday about the plan.  CODE STATUS:     Code Status Orders  (From admission, onward)         Start     Ordered   10/06/19 0010  Full code  Continuous     10/06/19 0016        Code Status History    This patient has a current code status but no historical code status.   Advance Care Planning Activity      TOTAL TIME TAKING CARE OF THIS PATIENT: 34 minutes.    Loletha Grayer M.D on 10/16/2019 at 12:37 PM  Between 7am to 6pm - Pager - [phone]  After 6pm go to [email or web address] - password [email or web address]  [facility]  CC: Primary care physician; Donnie Coffin, MD

## 2019-10-21 ENCOUNTER — Other Ambulatory Visit: Payer: Self-pay

## 2019-10-21 ENCOUNTER — Encounter: Payer: Self-pay | Admitting: Infectious Diseases

## 2019-10-21 ENCOUNTER — Ambulatory Visit: Payer: Self-pay | Attending: Infectious Diseases | Admitting: Infectious Diseases

## 2019-10-21 ENCOUNTER — Other Ambulatory Visit
Admission: RE | Admit: 2019-10-21 | Discharge: 2019-10-21 | Disposition: A | Discharge status: Home / Self Care | Payer: Self-pay | Source: Ambulatory Visit | Attending: Infectious Diseases | Admitting: Infectious Diseases

## 2019-10-21 VITALS — BP 132/84 | Temp 98.7°F | Resp 16 | Ht 65.0 in | Wt 218.0 lb

## 2019-10-21 DIAGNOSIS — B953 Streptococcus pneumoniae as the cause of diseases classified elsewhere: Secondary | ICD-10-CM

## 2019-10-21 DIAGNOSIS — Z720 Tobacco use: Secondary | ICD-10-CM

## 2019-10-21 DIAGNOSIS — I10 Essential (primary) hypertension: Secondary | ICD-10-CM

## 2019-10-21 DIAGNOSIS — G009 Bacterial meningitis, unspecified: Secondary | ICD-10-CM | POA: Insufficient documentation

## 2019-10-21 DIAGNOSIS — Z833 Family history of diabetes mellitus: Secondary | ICD-10-CM | POA: Insufficient documentation

## 2019-10-21 DIAGNOSIS — Z886 Allergy status to analgesic agent status: Secondary | ICD-10-CM

## 2019-10-21 DIAGNOSIS — J449 Chronic obstructive pulmonary disease, unspecified: Secondary | ICD-10-CM

## 2019-10-21 DIAGNOSIS — R7303 Prediabetes: Secondary | ICD-10-CM | POA: Insufficient documentation

## 2019-10-21 DIAGNOSIS — R7881 Bacteremia: Secondary | ICD-10-CM

## 2019-10-21 DIAGNOSIS — F17210 Nicotine dependence, cigarettes, uncomplicated: Secondary | ICD-10-CM | POA: Insufficient documentation

## 2019-10-21 DIAGNOSIS — Z8249 Family history of ischemic heart disease and other diseases of the circulatory system: Secondary | ICD-10-CM | POA: Insufficient documentation

## 2019-10-21 DIAGNOSIS — G001 Pneumococcal meningitis: Secondary | ICD-10-CM

## 2019-10-21 DIAGNOSIS — Z885 Allergy status to narcotic agent status: Secondary | ICD-10-CM

## 2019-10-21 DIAGNOSIS — Z7984 Long term (current) use of oral hypoglycemic drugs: Secondary | ICD-10-CM | POA: Insufficient documentation

## 2019-10-21 DIAGNOSIS — Z79899 Other long term (current) drug therapy: Secondary | ICD-10-CM | POA: Insufficient documentation

## 2019-10-21 LAB — CBC WITH DIFFERENTIAL/PLATELET
Abs Immature Granulocytes: 0.03 10*3/uL (ref 0.00–0.07)
Basophils Absolute: 0.1 10*3/uL (ref 0.0–0.1)
Basophils Relative: 1 %
Eosinophils Absolute: 0.1 10*3/uL (ref 0.0–0.5)
Eosinophils Relative: 1 %
HCT: 37.1 % (ref 36.0–46.0)
Hemoglobin: 12.4 g/dL (ref 12.0–15.0)
Immature Granulocytes: 0 %
Lymphocytes Relative: 20 %
Lymphs Abs: 2 10*3/uL (ref 0.7–4.0)
MCH: 28 pg (ref 26.0–34.0)
MCHC: 33.4 g/dL (ref 30.0–36.0)
MCV: 83.7 fL (ref 80.0–100.0)
Monocytes Absolute: 0.4 10*3/uL (ref 0.1–1.0)
Monocytes Relative: 4 %
Neutro Abs: 7.7 10*3/uL (ref 1.7–7.7)
Neutrophils Relative %: 74 %
Platelets: 365 10*3/uL (ref 150–400)
RBC: 4.43 MIL/uL (ref 3.87–5.11)
RDW: 15.9 % — ABNORMAL HIGH (ref 11.5–15.5)
WBC: 10.3 10*3/uL (ref 4.0–10.5)
nRBC: 0 % (ref 0.0–0.2)

## 2019-10-21 LAB — COMPREHENSIVE METABOLIC PANEL
ALT: 37 U/L (ref 0–44)
AST: 18 U/L (ref 15–41)
Albumin: 3.6 g/dL (ref 3.5–5.0)
Alkaline Phosphatase: 69 U/L (ref 38–126)
Anion gap: 15 (ref 5–15)
BUN: 9 mg/dL (ref 6–20)
CO2: 20 mmol/L — ABNORMAL LOW (ref 22–32)
Calcium: 9.8 mg/dL (ref 8.9–10.3)
Chloride: 108 mmol/L (ref 98–111)
Creatinine, Ser: 0.67 mg/dL (ref 0.44–1.00)
GFR calc Af Amer: >60 mL/min (ref >60)
GFR calc non Af Amer: >60 mL/min (ref >60)
Glucose, Bld: 121 mg/dL — ABNORMAL HIGH (ref 70–99)
Potassium: 3.8 mmol/L (ref 3.5–5.1)
Sodium: 143 mmol/L (ref 135–145)
Total Bilirubin: 0.4 mg/dL (ref 0.3–1.2)
Total Protein: 7.9 g/dL (ref 6.5–8.1)

## 2019-10-21 LAB — C-REACTIVE PROTEIN: CRP: 9.6 mg/dL — ABNORMAL HIGH (ref <1.0)

## 2019-10-21 NOTE — Patient Instructions (Addendum)
You are here for follow up of bacteria meningitis and bacteremia. You will complete 17 days of IV antibiotic tomorrow. You have residual head ache and some dizziness- on examination you have no cranial nerve paralysis and the both sides seem to have same strength. Follow up with your PCP for pneumococcal vaccination ( prevnar and pneumovac ) after you complete the IV antibiotic. Need to get Prevnar first and then 2-6 months later pneumovac. Continue to stay off smoking See an ENT to make sure there is no involvement of the sinus or inner ears eventhough MRI/CT did not show such! Your PICC line can be removed on thursday

## 2019-10-21 NOTE — Progress Notes (Signed)
NAME: Cathy Hughes  DOB: 1961-04-29  MRN: [id number]  Date/Time: 10/21/2019 11:28 AM Follow up after recent hospitalization Pt was admitted to [facility] between 10/05/19-10/16/19 She is here with her daughter? Cathy Hughes is a 59 y.o.female  A smoker, has copd, HTN was admitted to the hospital with headache and AMS and found to have bacterial meningitis and strep pneumo bacteremia. She was intubated for a short period of time. She was treated with IV ceftriaxone, TEE was negative for endocarditis- She was discharged on ceftriaxone to complete a total of 17 days on 10/22/19. She is doing much better, but c/o headache and dizziness She is participating in  PT and walking with a walker   Past Medical History:  Diagnosis Date  . Asthma   . COPD (chronic obstructive pulmonary disease) ([place])   . Hypertension   . Pre-diabetes     Past Surgical History:  Procedure Laterality Date  . CHOLECYSTECTOMY    . COLON SURGERY    . TEE WITHOUT CARDIOVERSION N/A 10/15/2019   Procedure: TRANSESOPHAGEAL ECHOCARDIOGRAM (TEE);  Surgeon: Kate Sable, MD;  Location: [facility] ORS;  Service: Cardiovascular;  Laterality: N/A;  . thumb surgery      Social History   Socioeconomic History  . Marital status: Married    Spouse name: [place]  . Number of children: 3  . Years of education: Not on file  . Highest education level: Not on file  Occupational History  . Not on file  Tobacco Use  . Smoking status: Current Some Day Smoker    Packs/day: 0.50    Years: 35.00    Pack years: 17.50    Types: Cigarettes  . Smokeless tobacco: Never Used  Substance and Sexual Activity  . Alcohol use: Yes    Comment: occ  . Drug use: No  . Sexual activity: Yes    Partners: Male  Other Topics Concern  . Not on file  Social History Narrative  . Not on file   Social Determinants of Health   Financial Resource Strain:   . Difficulty of Paying Living Expenses: Not on file  Food Insecurity:   . Worried About Ship broker in the Last Year: Not on file  . Ran Out of Food in the Last Year: Not on file  Transportation Needs:   . Lack of Transportation (Medical): Not on file  . Lack of Transportation (Non-Medical): Not on file  Physical Activity:   . Days of Exercise per Week: Not on file  . Minutes of Exercise per Session: Not on file  Stress:   . Feeling of Stress : Not on file  Social Connections:   . Frequency of Communication with Friends and Family: Not on file  . Frequency of Social Gatherings with Friends and Family: Not on file  . Attends Religious Services: Not on file  . Active Member of Clubs or Organizations: Not on file  . Attends Archivist Meetings: Not on file  . Marital Status: Not on file  Intimate Partner Violence:   . Fear of Current or Ex-Partner: Not on file  . Emotionally Abused: Not on file  . Physically Abused: Not on file  . Sexually Abused: Not on file    Family History  Problem Relation Age of Onset  . Diabetes Mother   . Hypertension Mother   . Diabetes Father   . Hypertension Father    Allergies  Allergen Reactions  . Aspirin Hives  . Tramadol Other (See  Comments)    Headache     ? Current Outpatient Medications  Medication Sig Dispense Refill  . albuterol (VENTOLIN HFA) 108 (90 Base) MCG/ACT inhaler Inhale 2-4 puffs by mouth every 4 hours as needed for wheezing, cough, and/or shortness of breath 18 g 0  . amLODipine (NORVASC) 10 MG tablet Take 0.5 tablets (5 mg total) by mouth daily. 30 tablet 0  . cefTRIAXone (ROCEPHIN) IVPB Inject 2 g into the vein every 12 (twelve) hours for 7 days. Indication: Pneumococcal bacteremia and meningitis Last Day of Therapy: 10/22/2019 Labs - Once weekly (Mondays):  CBC/D and CMP 15 Units 0  . Ensure Max Protein (ENSURE MAX PROTEIN) LIQD Take 330 mLs (11 oz total) by mouth 2 (two) times daily between meals. 660 mL 0  . fluticasone (FLONASE) 50 MCG/ACT nasal spray Place 2 sprays into both nostrils daily. 16 g 0   . glipiZIDE (GLUCOTROL) 5 MG tablet Take 1 tablet (5 mg total) by mouth 2 (two) times daily before a meal. 60 tablet 0  . ipratropium-albuterol (DUONEB) 0.5-2.5 (3) MG/3ML SOLN Take 3 mLs by nebulization every 6 (six) hours as needed. Dx: J44.1 180 mL 0  . meclizine (ANTIVERT) 12.5 MG tablet Take 1 tablet (12.5 mg total) by mouth 3 (three) times daily as needed for dizziness. 20 tablet 0  . montelukast (SINGULAIR) 10 MG tablet Take 1 tablet (10 mg total) by mouth daily. 30 tablet 0  . nystatin (MYCOSTATIN) 100000 UNIT/ML suspension Take 5 mLs (500,000 Units total) by mouth 4 (four) times daily. 60 mL 0  . omeprazole (PRILOSEC) 20 MG capsule Take 1 capsule (20 mg total) by mouth 2 (two) times daily. 60 capsule 0  . sodium chloride flush (NS) 0.9 % SOLN 10 mLs by Intracatheter route every 12 (twelve) hours. 15 Syringe 0   No current facility-administered medications for this visit.     Abtx:  Anti-infectives (From admission, onward)   None      REVIEW OF SYSTEMS:  Const: negative fever, rare chills, negative weight loss Eyes: negative diplopia or visual changes, negative eye pain ENT: negative coryza, negative sore throat Resp: negative cough, hemoptysis, dyspnea Cards: negative for chest pain, palpitations, lower extremity edema GU: negative for frequency, dysuria and hematuria GI: Negative for abdominal pain, diarrhea, bleeding, constipation Skin: negative for rash and pruritus Heme: negative for easy bruising and gum/nose bleeding MS: muscle weakness Neurolo headaches, dizziness, weakness rt side Psych: negative for feelings of anxiety, depression  Endocrine: negative for thyroid, diabetes Allergy/Immunology- aspirin, tramadol ?  Objective:  VITALS:  BP 132/84   Temp 98.7 F (37.1 C) (Oral)   Resp 16   Ht 5\' 5"  (1.651 m)   Wt 218 lb (98.9 kg)   LMP 09/30/2014   SpO2 92%   BMI 36.28 kg/m   HR 91 PHYSICAL EXAM:  General: Alert, cooperative, no distress, appears stated  age.  Head: Normocephalic, without obvious abnormality, atraumatic. Eyes: Conjunctivae clear, anicteric sclerae. Pupils are equal, full range of eye movt ENT Nares normal. No drainage or sinus tenderness. Lips, mucosa,N  and tongue coated Neck: Supple, symmetrical, no adenopathy, thyroid: non tender no carotid bruit and no JVD. Back: No CVA tenderness. Lungs: Clear to auscultation bilaterally. No Wheezing or Rhonchi. No rales. Heart: Regular rate and rhythm, no murmur, rub or gallop. Abdomen: unable to examine well because of her being in wheel chair Extremities: atraumatic, no cyanosis. No edema. No clubbing Skin: No rashes or lesions. Or bruising Lymph: Cervical, supraclavicular normal.  Neurologic: No cranial nerve palsy Power equal both sides Gait not checked Pertinent Labs Lab Results CBC    Component Value Date/Time   WBC 11.7 (H) 10/16/2019 0339   RBC 4.24 10/16/2019 0339   HGB 11.6 (L) 10/16/2019 0339   HCT 36.0 10/16/2019 0339   PLT 313 10/16/2019 0339   MCV 84.9 10/16/2019 0339   MCH 27.4 10/16/2019 0339   MCHC 32.2 10/16/2019 0339   RDW 15.6 (H) 10/16/2019 0339   LYMPHSABS 2.5 10/16/2019 0339   MONOABS 0.6 10/16/2019 0339   EOSABS 0.2 10/16/2019 0339   BASOSABS 0.0 10/16/2019 0339    CMP Latest Ref Rng & Units 10/16/2019 10/15/2019 10/14/2019  Glucose 70 - 99 mg/dL 114(H) 126(H) 155(H)  BUN 6 - 20 mg/dL 8 10 12   Creatinine 0.44 - 1.00 mg/dL 0.66 0.76 0.70  Sodium 135 - 145 mmol/L 144 143 143  Potassium 3.5 - 5.1 mmol/L 3.7 3.4(L) 3.1(L)  Chloride 98 - 111 mmol/L 110 108 106  CO2 22 - 32 mmol/L 28 24 26   Calcium 8.9 - 10.3 mg/dL 8.9 8.6(L) 8.4(L)  Total Protein 6.5 - 8.1 g/dL - - -  Total Bilirubin 0.3 - 1.2 mg/dL - - -  Alkaline Phos 38 - 126 U/L - - -  AST 15 - 41 U/L - - -  ALT 0 - 44 U/L - - -    ? Impression/Recommendation ? Strep pneumo bacteremia with bacterial meningitis- no risk factor idenitified like HIV/MM/SCC. Smoker/COPD CT/MRI did not reveal  any sinusitis or mastoiditis Pt on day 16 of IV antibiotic and will finish tomorrw PICC will be removed on Thursday Pt need to get vaccinated for pneumococcus with Prevnar 13 and after 2-6 months with Pneumovac Will inform her PCP She needs ENT evaluation as she had some ear fullness and pain  HIV negative  Smoker- has npt started smoking since discharge- asked her to stay off smoking' COPD  HTn  ?CBC/CMP done today Cr < 1 WBC 10 ? ___________________________________________________ Discussed with patient, and her daughter Note:  This document was prepared using Dragon voice recognition software and may include unintentional dictation errors.

## 2019-10-27 ENCOUNTER — Telehealth: Payer: Self-pay

## 2019-10-27 NOTE — Telephone Encounter (Signed)
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 Per client, has never received vaccine previously (unable to access [facility] at current time). Per client, has COPD, asthma, DM and HTN. Meets criteria in standing order for vaccine administration. Vaccine cost is $111.00. Client counseled agency does not have vaccine available at no cost and aware RN will discuss her request with Inetta Fermo, DON. Call to [name] with above info and she will consult with [facility] Health Director. Rich Number, RN

## 2019-11-05 ENCOUNTER — Telehealth: Payer: Self-pay | Admitting: Family Medicine

## 2019-11-05 LAB — FUNGUS CULTURE RESULT

## 2019-11-05 LAB — FUNGUS CULTURE WITH STAIN

## 2019-11-05 LAB — FUNGAL ORGANISM REFLEX

## 2019-11-05 NOTE — Telephone Encounter (Signed)
Rn has reached out to [facility] coordinator for guidance.  RN will call patient once options are determined. Aileen Fass, RN

## 2019-11-05 NOTE — Telephone Encounter (Signed)
Patient is calling about receiving free pneumonia shot.

## 2019-11-06 ENCOUNTER — Ambulatory Visit (LOCAL_COMMUNITY_HEALTH_CENTER): Payer: Self-pay

## 2019-11-06 DIAGNOSIS — Z23 Encounter for immunization: Secondary | ICD-10-CM

## 2019-11-06 NOTE — Progress Notes (Signed)
Patient recently hospitalized for meningitis; was instructed by physician to receive Prevnar 13. Patient has no insurance and cannot pay $210 for Prevnar 13.   RN took patient the [employer] patient assistance application for Pneumovax 23 and completed it with patient at her residence.  [employer] approved patient for Pneumovax 23. RN delivered and administered vaccine to patient at her residence.  Tolerated well.  [facility] copy given with vaccine info. Instructed to call [facility]/ M. Dajanay Northrup RN if any other questions or concerns.  Aileen Fass, RN

## 2019-11-06 NOTE — Telephone Encounter (Signed)
Phone call to pt. Left message on voicemail that RN with [facility] is returning call. Left details about [employer] vaccine pt assistance program, and also [facility] pricing for Pneumax 23 vaccine if pt assistance is declined or not approved.  Call [phone] for appt if desired.

## 2019-11-06 NOTE — Telephone Encounter (Signed)
TC with patient recently hospitalized for meningitis; was instructed by physician to receive Prevnar 13. Patient has no insurance and cannot pay $210 for Prevnar 13.   RN took patient the [employer] patient assistance application for Pneumovax 23 and completed it with patient at her residence.  [employer] approved patient for Pneumovax 23. RN delivered and administered vaccine to patient at her residence. See visit note. Aileen Fass, RN

## 2019-12-04 ENCOUNTER — Telehealth: Payer: Self-pay | Admitting: Pharmacy Technician

## 2019-12-04 NOTE — Telephone Encounter (Signed)
Patient failed to provide 2021 proof of income.  No additional medication assistance will be provided by [facility] without the required proof of income documentation.  Patient notified by letter.  Juli Odom J. Juliano Mceachin Care Manager [facility]   [address] [place], [place]  [address]     This is to inform you that you are no longer eligible to receive medication assistance at [facility].  The reason(s) are:    _____Your total gross monthly household income exceeds 250% of the Federal Poverty Level.   _____Tangible assets (savings, checking, stocks/bonds, pension, retirement, etc.) exceeds our limit  _____You are eligible to receive benefits from Medicaid, [facility] or HIV Medication              Assistance Program _____You are eligible to receive benefits from a Medicare Part "D" plan _____You have prescription insurance  _____You are not an [place] resident __X__Failure to provide all requested proof of income information for 2021.    Medication assistance will resume once all requested financial information has been returned to our clinic.  If you have questions, please contact our clinic at [phone].    Thank you,  [facility] 

## 2019-12-05 ENCOUNTER — Ambulatory Visit: Payer: Self-pay | Admitting: *Deleted

## 2019-12-05 NOTE — Telephone Encounter (Signed)
Pt called in on the community line because her doctor has not called her back yet.   She checked her glucose just now and it's 190.   She has not had lunch.   "I'm usually around 140s".    All this diabetes stuff is new to me.   She was in [facility] in January 2021.  She is type II.   Does not take insulin.   She is thirsty and urinating a lot when I asked her about it. "I called my doctor Dr. Tomasa Hose at the [facility] but she has not called me back yet so I called this number".  (Our community line).  She c/o being dizzy.   "I get dizzy when my blood sugar is higher than it's supposed to be".   I instructed her to remain sitting as much as she can or if she gets where she can't walk and feels confused to call 911.  I went over the care advice.  I also advised her to call her doctor's office again and tell whoever answers the phone you need to talk with Dr. Clide Deutscher or her nurse about your blood sugars.   "My doctor said she might start me on metformin, I think is what she called that medicine".   I let her know that was the correct name.   She was agreeable to calling Dr. Phill Mutter office back.   I let her know if she needed she could go to an urgent care center or ED.   She verbalized understanding but would really rather talk with her doctor which I told her would be good if she could.    Reason for Disposition . [1] Symptoms of high blood sugar (e.g., frequent urination, weak, weight loss) AND [2] not able to test blood glucose  Answer Assessment - Initial Assessment Questions 1. BLOOD GLUCOSE: "What is your blood glucose level?"      My glucose is 190 now.  I'm feeling dizzy is why I took it. 2. ONSET: "When did you check the blood glucose?"     Just now.   I take glipizide.   3. USUAL RANGE: "What is your glucose level usually?" (e.g., usual fasting morning value, usual evening value)     Not eat it's 144 in the mornings.   Afternoon it's around 140.   4.  KETONES: "Do you check for ketones (urine or blood test strips)?" If yes, ask: "What does the test show now?"      No 5. TYPE 1 or 2:  "Do you know what type of diabetes you have?"  (e.g., Type 1, Type 2, Gestational; doesn't know)      Type II 6. INSULIN: "Do you take insulin?" "What type of insulin(s) do you use? What is the mode of delivery? (syringe, pen; injection or pump)?"      No 7. DIABETES PILLS: "Do you take any pills for your diabetes?" If yes, ask: "Have you missed taking any pills recently?"     Glilipize 8. OTHER SYMPTOMS: "Do you have any symptoms?" (e.g., fever, frequent urination, difficulty breathing, dizziness, weakness, vomiting)     I ate breakfast but not lunch yet.   I'm dizzy.  I've been dizzy 30 minute to 1 hour. I'm to see my diabetes doctor on December 11, 2019.   I've called my doctor's nurse but she has not called me back.   9. PREGNANCY: "Is there any chance you are pregnant?" "When was  your last menstrual period?"     *No Answer*  Protocols used: DIABETES - HIGH BLOOD SUGAR-A-AH

## 2020-03-02 ENCOUNTER — Telehealth: Payer: Self-pay

## 2020-03-02 NOTE — Telephone Encounter (Signed)
Patient went to neurology and PCP about headaches she has had since meningitis and they both advised her to call us. I advised she should come in since it has been a few months and she wanted me to see if there is anything you recommend before making appt.

## 2020-03-04 NOTE — Telephone Encounter (Signed)
Advised to call PCP and keep ENT appt. Also advised to see PCP abut fatigue and headaches and if they said she needs to f/u with [place] we would need new referral as these issues would not be related to meningitis.

## 2020-05-26 ENCOUNTER — Emergency Department
Admission: EM | Admit: 2020-05-26 | Discharge: 2020-05-26 | Disposition: A | Discharge status: Home / Self Care | Payer: Medicaid Other | Attending: Emergency Medicine | Admitting: Emergency Medicine

## 2020-05-26 ENCOUNTER — Other Ambulatory Visit: Payer: Self-pay

## 2020-05-26 ENCOUNTER — Emergency Department: Payer: Medicaid Other

## 2020-05-26 DIAGNOSIS — F17210 Nicotine dependence, cigarettes, uncomplicated: Secondary | ICD-10-CM | POA: Insufficient documentation

## 2020-05-26 DIAGNOSIS — Z20822 Contact with and (suspected) exposure to covid-19: Secondary | ICD-10-CM | POA: Insufficient documentation

## 2020-05-26 DIAGNOSIS — J45909 Unspecified asthma, uncomplicated: Secondary | ICD-10-CM | POA: Insufficient documentation

## 2020-05-26 DIAGNOSIS — Z79899 Other long term (current) drug therapy: Secondary | ICD-10-CM | POA: Insufficient documentation

## 2020-05-26 DIAGNOSIS — B349 Viral infection, unspecified: Secondary | ICD-10-CM | POA: Insufficient documentation

## 2020-05-26 DIAGNOSIS — Z7984 Long term (current) use of oral hypoglycemic drugs: Secondary | ICD-10-CM | POA: Insufficient documentation

## 2020-05-26 DIAGNOSIS — E119 Type 2 diabetes mellitus without complications: Secondary | ICD-10-CM | POA: Insufficient documentation

## 2020-05-26 DIAGNOSIS — J029 Acute pharyngitis, unspecified: Secondary | ICD-10-CM | POA: Insufficient documentation

## 2020-05-26 DIAGNOSIS — I10 Essential (primary) hypertension: Secondary | ICD-10-CM | POA: Insufficient documentation

## 2020-05-26 LAB — SARS CORONAVIRUS 2 BY RT PCR (HOSPITAL ORDER, PERFORMED IN ~~LOC~~ HOSPITAL LAB): SARS Coronavirus 2: NEGATIVE

## 2020-05-26 LAB — GROUP A STREP BY PCR: Group A Strep by PCR: NOT DETECTED

## 2020-05-26 IMAGING — DX DG CHEST 1V PORT
1 series · 1 of 1 positions shown · non-contrast
Comparison: [DATE] chest radiograph and prior.

CLINICAL DATA: Cough

EXAM:
PORTABLE CHEST 1 VIEW

[chest ap]
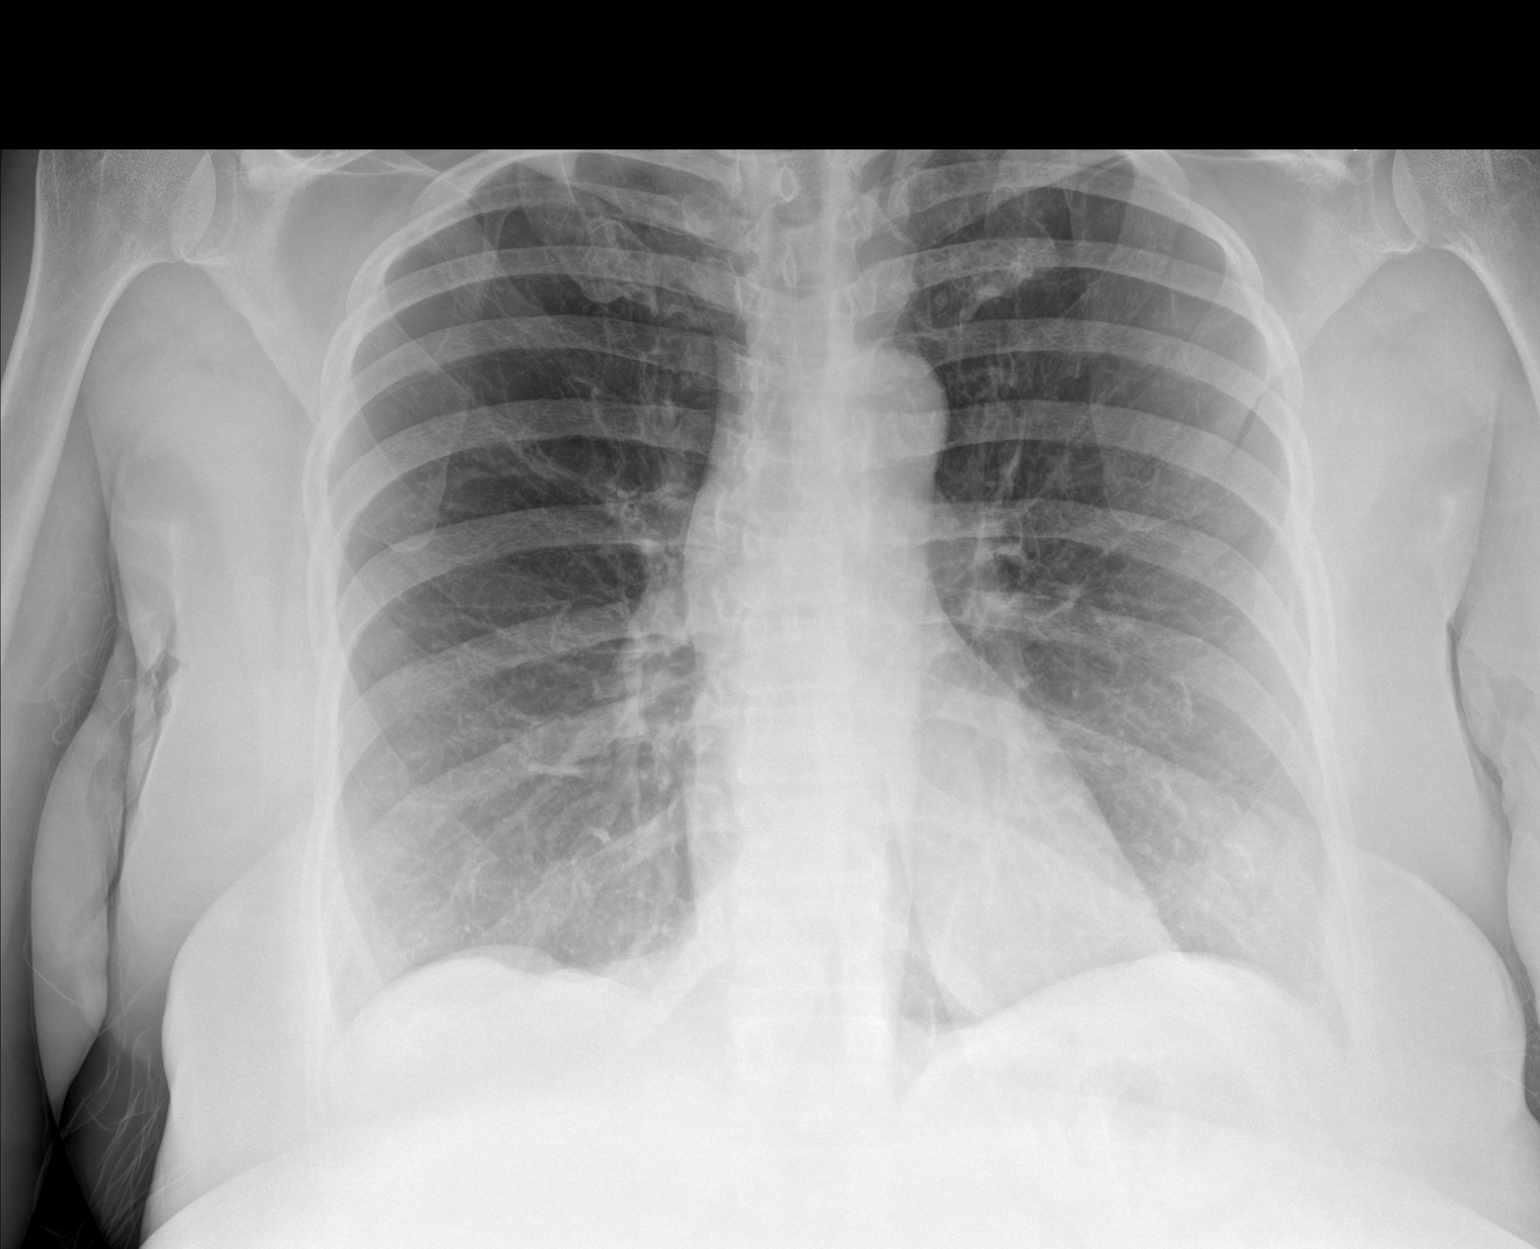

[1 of 1 positions shown; findings below may reference images not displayed]

FINDINGS: The heart size and mediastinal contours are within normal limits.
Both lungs are clear. No pneumothorax or pleural effusion. No acute
osseous abnormality.
IMPRESSION: No focal airspace disease.

## 2020-05-26 MED ORDER — LIDOCAINE VISCOUS HCL 2 % MT SOLN: 5.0000 mL | Freq: Four times a day (QID) | OROMUCOSAL | 0 refills | Status: AC | PRN

## 2020-05-26 MED ORDER — PSEUDOEPH-BROMPHEN-DM 30-2-10 MG/5ML PO SYRP: 5.0000 mL | ORAL_SOLUTION | Freq: Four times a day (QID) | ORAL | 0 refills | Status: DC | PRN

## 2020-05-26 NOTE — Discharge Instructions (Addendum)
Advised self quarantine pending results of COVID-19 test.  Test is positive was quarantine additional 14 days.  Follow discharge care instruction take medication as directed.

## 2020-05-26 NOTE — ED Provider Notes (Signed)
[facility] Emergency Department Provider Note   ____________________________________________   First MD Initiated Contact with Patient 05/26/20 [phone]     (approximate)  I have reviewed the triage vital signs and the nursing notes.   HISTORY  Chief Complaint URI    HPI Cathy Hughes is a 59 y.o. female patient complaining of cough, chest congestion, sore throat for 3 days.  Patient denies recent travel or known contact with COVID-19.  Patient not taking vaccine for COVID-19.  Rates pain as a 10/10.  Described pain as "sore/achy".  No palliative measure for complaint.         Past Medical History:  Diagnosis Date  . Asthma   . COPD (chronic obstructive pulmonary disease) ([place])   . Hypertension   . Pre-diabetes     Patient Active Problem List   Diagnosis Date Noted  . Thrush   . Right sided weakness   . Bacteremia due to Streptococcus 10/13/2019  . 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 Perennial allergic rhinitis 10/06/2019  . Tobacco abuse 10/06/2019  . Sepsis ([place]) 10/05/2019  . Acute metabolic encephalopathy 52/84/1324  . Hyperglycemia due to type 2 diabetes mellitus ([place]) 10/05/2019  . Left ear pain 10/05/2019  . COPD (chronic obstructive pulmonary disease) ([place]) 07/31/2018  . Class 1 obesity due to excess calories with serious comorbidity and body mass index (BMI) of 34.0 to 34.9 in adult 07/31/2018  . HTN (hypertension) 12/09/2013    Past Surgical History:  Procedure Laterality Date  . CHOLECYSTECTOMY    . COLON SURGERY    . TEE WITHOUT CARDIOVERSION N/A 10/15/2019   Procedure: TRANSESOPHAGEAL ECHOCARDIOGRAM (TEE);  Surgeon: Kate Sable, MD;  Location: [facility] ORS;  Service: Cardiovascular;  Laterality: N/A;  . thumb surgery      Prior to Admission medications   Medication Sig Start Date End Date Taking? Authorizing Provider  albuterol (VENTOLIN HFA) 108 (90 Base) MCG/ACT inhaler Inhale 2-4 puffs by  mouth every 4 hours as needed for wheezing, cough, and/or shortness of breath 10/15/19   Loletha Grayer, MD  amLODipine (NORVASC) 10 MG tablet Take 0.5 tablets (5 mg total) by mouth daily. 10/16/19   Loletha Grayer, MD  brompheniramine-pseudoephedrine-DM 30-2-10 MG/5ML syrup Take 5 mLs by mouth 4 (four) times daily as needed. Mix with 5 mL of viscous lidocaine for swish and swallow 05/26/20   Sable Feil, PA-C  Ensure Max Protein (ENSURE MAX PROTEIN) LIQD Take 330 mLs (11 oz total) by mouth 2 (two) times daily between meals. 10/15/19   Loletha Grayer, MD  fluticasone (FLONASE) 50 MCG/ACT nasal spray Place 2 sprays into both nostrils daily. 10/15/19 10/14/20  Loletha Grayer, MD  glipiZIDE (GLUCOTROL) 5 MG tablet Take 1 tablet (5 mg total) by mouth 2 (two) times daily before a meal. 10/15/19   Leslye Peer, Richard, MD  ipratropium-albuterol (DUONEB) 0.5-2.5 (3) MG/3ML SOLN Take 3 mLs by nebulization every 6 (six) hours as needed. Dx: J44.1 10/15/19   Loletha Grayer, MD  lidocaine (XYLOCAINE) 2 % solution Use as directed 5 mLs in the mouth or throat every 6 (six) hours as needed for mouth pain. Mix with 5 mL of Bromfed-DM for swish and swallow. 05/26/20   Sable Feil, PA-C  meclizine (ANTIVERT) 12.5 MG tablet Take 1 tablet (12.5 mg total) by mouth 3 (three) times daily as needed for dizziness. 10/16/19   Loletha Grayer, MD  montelukast (SINGULAIR) 10 MG tablet Take 1 tablet (10 mg total) by mouth daily. 10/15/19  10/14/20  Loletha Grayer, MD  nystatin (MYCOSTATIN) 100000 UNIT/ML suspension Take 5 mLs (500,000 Units total) by mouth 4 (four) times daily. 10/15/19   Loletha Grayer, MD  omeprazole (PRILOSEC) 20 MG capsule Take 1 capsule (20 mg total) by mouth 2 (two) times daily. 10/15/19 01/13/20  Loletha Grayer, MD  sodium chloride flush (NS) 0.9 % SOLN 10 mLs by Intracatheter route every 12 (twelve) hours. 10/15/19   Loletha Grayer, MD    Allergies Aspirin and Tramadol  Family History  Problem Relation Age  of Onset  . Diabetes Mother   . Hypertension Mother   . Diabetes Father   . Hypertension Father     Social History Social History   Tobacco Use  . Smoking status: Current Some Day Smoker    Packs/day: 0.50    Years: 35.00    Pack years: 17.50    Types: Cigarettes  . Smokeless tobacco: Never Used  Vaping Use  . Vaping Use: Former  . Quit date: 06/25/2018  Substance Use Topics  . Alcohol use: Yes    Comment: occ  . Drug use: No    Review of Systems Constitutional: No fever/chills Eyes: No visual changes. ENT: Sore throat. Cardiovascular: Denies chest pain. Respiratory: Denies shortness of breath.  Dr. Cough. Gastrointestinal: No abdominal pain.  No nausea, no vomiting.  No diarrhea.  No constipation. Genitourinary: Negative for dysuria. Musculoskeletal: Negative for back pain. Skin: Negative for rash. Neurological: Negative for headaches, focal weakness or numbness. Endocrine:  Hypertension. Hematological/Lymphatic:  Allergic/Immunilogical: Aspirin/ Tylenol ____________________________________________   PHYSICAL EXAM:  VITAL SIGNS: ED Triage Vitals  Enc Vitals Group     BP 05/26/20 0910 (!) 127/59     Pulse Rate 05/26/20 0910 88     Resp 05/26/20 0910 18     Temp 05/26/20 0910 98.6 F (37 C)     Temp Source 05/26/20 0910 Oral     SpO2 05/26/20 0910 94 %     Weight 05/26/20 0911 188 lb (85.3 kg)     Height 05/26/20 0911 5\' 5"  (1.651 m)     Head Circumference --      Peak Flow --      Pain Score 05/26/20 0911 10     Pain Loc --      Pain Edu? --      Excl. in [place]? --    Constitutional: Alert and oriented. Well appearing and in no acute distress. Eyes: Conjunctivae are normal. PERRL. EOMI. Head: Atraumatic. Nose: No rhinorrhea. Mouth/Throat: Mucous membranes are moist.  Oropharynx-erythematous. Neck: No stridor.  Hematological/Lymphatic/Immunilogical: No cervical lymphadenopathy. Cardiovascular: Normal rate, regular rhythm. Grossly normal heart sounds.   Good peripheral circulation. Respiratory: Normal respiratory effort.  No retractions. Lungs CTAB. Gastrointestinal: Soft and nontender. No distention. No abdominal bruits. No CVA tenderness. Genitourinary: Deferred Musculoskeletal: No lower extremity tenderness nor edema.  No joint effusions. Neurologic:  Normal speech and language. No gross focal neurologic deficits are appreciated. No gait instability. Skin:  Skin is warm, dry and intact. No rash noted. Psychiatric: Mood and affect are normal. Speech and behavior are normal.  ____________________________________________   LABS (all labs ordered are listed, but only abnormal results are displayed)  Labs Reviewed  GROUP A STREP BY PCR  SARS CORONAVIRUS 2 BY RT PCR (HOSPITAL ORDER, [facility])   ____________________________________________  EKG   ____________________________________________  RADIOLOGY  ED MD interpretation:    Official radiology report(s): DG Chest Portable 1 View  Result Date: 05/26/2020 CLINICAL  DATA:  Cough EXAM: PORTABLE CHEST 1 VIEW COMPARISON:  10/09/2019 chest radiograph and prior. FINDINGS: The heart size and mediastinal contours are within normal limits. Both lungs are clear. No pneumothorax or pleural effusion. No acute osseous abnormality. IMPRESSION: No focal airspace disease. Electronically Signed   By: Primitivo Gauze M.D.   On: 05/26/2020 10:16    ____________________________________________   PROCEDURES  Procedure(s) performed (including Critical Care):  Procedures   ____________________________________________   INITIAL IMPRESSION / ASSESSMENT AND PLAN / ED COURSE  As part of my medical decision making, I reviewed the following data within the [place]     Patient presents with sore throat, cough, and chest congestion.  Discussed no acute findings on chest x-ray.  Rapid strep test was negative.  COVID-19 test is pending.  Patient  given discharge care instruction advised to self quarantine pending results of COVID-19 test.         ____________________________________________   FINAL CLINICAL IMPRESSION(S) / ED DIAGNOSES  Final diagnoses:  Pharyngitis with viral syndrome     ED Discharge Orders         Ordered    brompheniramine-pseudoephedrine-DM 30-2-10 MG/5ML syrup  4 times daily PRN        05/26/20 1135    lidocaine (XYLOCAINE) 2 % solution  Every 6 hours PRN        05/26/20 1135          *Please note:  ALZADA BRAZEE was evaluated in Emergency Department on 05/26/2020 for the symptoms described in the history of present illness. She was evaluated in the context of the global COVID-19 pandemic, which necessitated consideration that the patient might be at risk for infection with the SARS-CoV-2 virus that causes COVID-19. Institutional protocols and algorithms that pertain to the evaluation of patients at risk for COVID-19 are in a state of rapid change based on information released by regulatory bodies including the CDC and federal and state organizations. These policies and algorithms were followed during the patient's care in the ED.  Some ED evaluations and interventions may be delayed as a result of limited staffing during and the pandemic.*   Note:  This document was prepared using Dragon voice recognition software and may include unintentional dictation errors.    Sable Feil, PA-C 05/26/20 1138    Vladimir Crofts, MD 05/26/20 1536

## 2020-05-26 NOTE — ED Triage Notes (Signed)
Pt c/o cough with congestion and sore throat since Sunday. Pt is in NAD.

## 2020-05-26 NOTE — ED Notes (Signed)
See triage note  Presents with sore thorat and congestion  sx's

## 2020-05-26 NOTE — ED Notes (Signed)
See triage note  Presents with sore throat and congestion  States sx's started couple of days ago  Afebrile on arrival

## 2020-09-26 ENCOUNTER — Other Ambulatory Visit: Payer: Self-pay

## 2020-09-26 ENCOUNTER — Emergency Department
Admission: EM | Admit: 2020-09-26 | Discharge: 2020-09-26 | Disposition: A | Discharge status: Home / Self Care | Payer: Medicaid Other | Attending: Emergency Medicine | Admitting: Emergency Medicine

## 2020-09-26 ENCOUNTER — Encounter: Payer: Self-pay | Admitting: Emergency Medicine

## 2020-09-26 DIAGNOSIS — J449 Chronic obstructive pulmonary disease, unspecified: Secondary | ICD-10-CM | POA: Insufficient documentation

## 2020-09-26 DIAGNOSIS — Z79899 Other long term (current) drug therapy: Secondary | ICD-10-CM | POA: Insufficient documentation

## 2020-09-26 DIAGNOSIS — J45909 Unspecified asthma, uncomplicated: Secondary | ICD-10-CM | POA: Insufficient documentation

## 2020-09-26 DIAGNOSIS — Z7984 Long term (current) use of oral hypoglycemic drugs: Secondary | ICD-10-CM | POA: Insufficient documentation

## 2020-09-26 DIAGNOSIS — E1165 Type 2 diabetes mellitus with hyperglycemia: Secondary | ICD-10-CM | POA: Insufficient documentation

## 2020-09-26 DIAGNOSIS — F17210 Nicotine dependence, cigarettes, uncomplicated: Secondary | ICD-10-CM | POA: Insufficient documentation

## 2020-09-26 DIAGNOSIS — I10 Essential (primary) hypertension: Secondary | ICD-10-CM | POA: Insufficient documentation

## 2020-09-26 DIAGNOSIS — F410 Panic disorder [episodic paroxysmal anxiety] without agoraphobia: Secondary | ICD-10-CM | POA: Insufficient documentation

## 2020-09-26 HISTORY — DX: Anxiety disorder, unspecified: F41.9

## 2020-09-26 HISTORY — DX: Type 2 diabetes mellitus without complications: E11.9

## 2020-09-26 MED ORDER — ALPRAZOLAM 0.5 MG PO TABS: 0.5000 mg | ORAL_TABLET | Freq: Three times a day (TID) | ORAL | 0 refills | Status: AC | PRN

## 2020-09-26 MED ORDER — SERTRALINE HCL 50 MG PO TABS: 50.0000 mg | ORAL_TABLET | Freq: Every day | ORAL | 2 refills | Status: AC

## 2020-09-26 MED ORDER — LORAZEPAM 2 MG/ML IJ SOLN
1.0000 mg | Freq: Once | INTRAMUSCULAR | Status: AC
  Administered 2020-09-26: 1 mg via INTRAMUSCULAR
  Filled 2020-09-26: qty 1

## 2020-09-26 NOTE — ED Provider Notes (Signed)
[facility] Emergency Department Provider Note  ____________________________________________   Event Date/Time   First MD Initiated Contact with Patient 09/26/20 1527     (approximate)  I have reviewed the triage vital signs and the nursing notes.   HISTORY  Chief Complaint Anxiety    HPI Cathy Hughes is a 60 y.o. female presents emergency department states been under a lot of stress recently is having an anxiety attack.  She feels like she is just running and running and running and crying.  She denies SI/HI.  Patient is not on any medication for anxiety.  She states that she thinks it has something to do with her having flashbacks to last year when she was admitted for meningitis.  She has no other symptoms at this time.    Past Medical History:  Diagnosis Date  . Anxiety   . Asthma   . COPD (chronic obstructive pulmonary disease) ([place])   . Diabetes mellitus without complication ([place])   . Hypertension   . Pre-diabetes     Patient Active Problem List   Diagnosis Date Noted  . Thrush   . Right sided weakness   . Bacteremia due to Streptococcus 10/13/2019  . 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 Perennial allergic rhinitis 10/06/2019  . Tobacco abuse 10/06/2019  . Sepsis ([place]) 10/05/2019  . Acute metabolic encephalopathy [id number]  . Hyperglycemia due to type 2 diabetes mellitus ([place]) 10/05/2019  . Left ear pain 10/05/2019  . COPD (chronic obstructive pulmonary disease) ([place]) 07/31/2018  . Class 1 obesity due to excess calories with serious comorbidity and body mass index (BMI) of 34.0 to 34.9 in adult 07/31/2018  . HTN (hypertension) 12/09/2013    Past Surgical History:  Procedure Laterality Date  . CHOLECYSTECTOMY    . COLON SURGERY    . TEE WITHOUT CARDIOVERSION N/A 10/15/2019   Procedure: TRANSESOPHAGEAL ECHOCARDIOGRAM (TEE);  Surgeon: Kate Sable, MD;  Location: [facility] ORS;  Service: Cardiovascular;   Laterality: N/A;  . thumb surgery      Prior to Admission medications   Medication Sig Start Date End Date Taking? Authorizing Provider  ALPRAZolam Duanne Moron) 0.5 MG tablet Take 1 tablet (0.5 mg total) by mouth 3 (three) times daily as needed for sleep or anxiety. 09/26/20 09/26/21 Yes Masiya Claassen, Linden Dolin, PA-C  sertraline (ZOLOFT) 50 MG tablet Take 1 tablet (50 mg total) by mouth daily. 09/26/20 09/26/21 Yes Abyan Cadman, Linden Dolin, PA-C  albuterol (VENTOLIN HFA) 108 (90 Base) MCG/ACT inhaler Inhale 2-4 puffs by mouth every 4 hours as needed for wheezing, cough, and/or shortness of breath 10/15/19   Loletha Grayer, MD  amLODipine (NORVASC) 10 MG tablet Take 0.5 tablets (5 mg total) by mouth daily. 10/16/19   Loletha Grayer, MD  brompheniramine-pseudoephedrine-DM 30-2-10 MG/5ML syrup Take 5 mLs by mouth 4 (four) times daily as needed. Mix with 5 mL of viscous lidocaine for swish and swallow 05/26/20   Sable Feil, PA-C  Ensure Max Protein (ENSURE MAX PROTEIN) LIQD Take 330 mLs (11 oz total) by mouth 2 (two) times daily between meals. 10/15/19   Loletha Grayer, MD  fluticasone (FLONASE) 50 MCG/ACT nasal spray Place 2 sprays into both nostrils daily. 10/15/19 10/14/20  Loletha Grayer, MD  glipiZIDE (GLUCOTROL) 5 MG tablet Take 1 tablet (5 mg total) by mouth 2 (two) times daily before a meal. 10/15/19   Leslye Peer, Richard, MD  ipratropium-albuterol (DUONEB) 0.5-2.5 (3) MG/3ML SOLN Take 3 mLs by nebulization every 6 (six) hours as needed.  Dx: J44.1 10/15/19   Loletha Grayer, MD  lidocaine (XYLOCAINE) 2 % solution Use as directed 5 mLs in the mouth or throat every 6 (six) hours as needed for mouth pain. Mix with 5 mL of Bromfed-DM for swish and swallow. 05/26/20   Sable Feil, PA-C  meclizine (ANTIVERT) 12.5 MG tablet Take 1 tablet (12.5 mg total) by mouth 3 (three) times daily as needed for dizziness. 10/16/19   Loletha Grayer, MD  montelukast (SINGULAIR) 10 MG tablet Take 1 tablet (10 mg total) by mouth daily. 10/15/19  10/14/20  Loletha Grayer, MD  nystatin (MYCOSTATIN) 100000 UNIT/ML suspension Take 5 mLs (500,000 Units total) by mouth 4 (four) times daily. 10/15/19   Loletha Grayer, MD  omeprazole (PRILOSEC) 20 MG capsule Take 1 capsule (20 mg total) by mouth 2 (two) times daily. 10/15/19 01/13/20  Loletha Grayer, MD  sodium chloride flush (NS) 0.9 % SOLN 10 mLs by Intracatheter route every 12 (twelve) hours. 10/15/19   Loletha Grayer, MD    Allergies Aspirin and Tramadol  Family History  Problem Relation Age of Onset  . Diabetes Mother   . Hypertension Mother   . Diabetes Father   . Hypertension Father     Social History Social History   Tobacco Use  . Smoking status: Current Some Day Smoker    Packs/day: 0.50    Years: 35.00    Pack years: 17.50    Types: Cigarettes  . Smokeless tobacco: Never Used  Vaping Use  . Vaping Use: Former  . Quit date: 06/25/2018  Substance Use Topics  . Alcohol use: Yes    Comment: occ  . Drug use: No    Review of Systems  Constitutional: No fever/chills Eyes: No visual changes. ENT: No sore throat. Respiratory: Denies cough Cardiovascular: Denies chest pain Gastrointestinal: Denies abdominal pain Genitourinary: Negative for dysuria. Musculoskeletal: Negative for back pain. Skin: Negative for rash. Psychiatric: Positive for anxiety and tearfulness  ____________________________________________   PHYSICAL EXAM:  VITAL SIGNS: ED Triage Vitals  Enc Vitals Group     BP 09/26/20 1523 120/67     Pulse Rate 09/26/20 1523 82     Resp 09/26/20 1523 18     Temp 09/26/20 1523 98.7 F (37.1 C)     Temp Source 09/26/20 1523 Oral     SpO2 09/26/20 1523 95 %     Weight 09/26/20 1517 182 lb (82.6 kg)     Height 09/26/20 1517 5\' 5"  (1.651 m)     Head Circumference --      Peak Flow --      Pain Score 09/26/20 1517 0     Pain Loc --      Pain Edu? --      Excl. in [place]? --     Constitutional: Alert and oriented. Well appearing and in no acute  distress.  Patient is crying and anxious Eyes: Conjunctivae are normal.  Head: Atraumatic. Nose: No congestion/rhinnorhea. Mouth/Throat: Mucous membranes are moist.  Neck:  supple no lymphadenopathy noted Cardiovascular: Normal rate, regular rhythm. Heart sounds are normal Respiratory: Normal respiratory effort.  No retractions, lungs c t a  GU: deferred Musculoskeletal: FROM all extremities, warm and well perfused Neurologic:  Normal speech and language.  Skin:  Skin is warm, dry and intact. No rash noted. Psychiatric: Patient is very anxious, crying, speech is normal.  ____________________________________________   LABS (all labs ordered are listed, but only abnormal results are displayed)  Labs Reviewed - No data to display  ____________________________________________   ____________________________________________  DGLOVFIEP    ____________________________________________   PROCEDURES  Procedure(s) performed: Ativan 1 mg IM   Procedures    ____________________________________________   INITIAL IMPRESSION / ASSESSMENT AND PLAN / ED COURSE  Pertinent labs & imaging results that were available during my care of the patient were reviewed by me and considered in my medical decision making (see chart for details).   The patient is a 60 year old female presents with anxiety/panic attack.  See HPI.  Physical exam is consistent with an anxiety attack.  Patient was given Ativan 1 mg IM.  Will reassess after she has had time for the medication to take effect   Patient states she feels much better after the Ativan.  I did give her prescription for Zoloft 50 mg and Xanax for acute panic attacks.  She is to follow-up with her regular doctor next week.  Return to emergency department worsening.  She states she understands.  She is discharged stable condition in the care of family member.  Cathy Hughes was evaluated in Emergency Department on 09/26/2020 for the symptoms  described in the history of present illness. She was evaluated in the context of the global COVID-19 pandemic, which necessitated consideration that the patient might be at risk for infection with the SARS-CoV-2 virus that causes COVID-19. Institutional protocols and algorithms that pertain to the evaluation of patients at risk for COVID-19 are in a state of rapid change based on information released by regulatory bodies including the CDC and federal and state organizations. These policies and algorithms were followed during the patient's care in the ED.    As part of my medical decision making, I reviewed the following data within the [place] History obtained from family, Nursing notes reviewed and incorporated, Old chart reviewed, Notes from prior ED visits and [place] Controlled Substance Database  ____________________________________________   FINAL CLINICAL IMPRESSION(S) / ED DIAGNOSES  Final diagnoses:  Anxiety attack      NEW MEDICATIONS STARTED DURING THIS VISIT:  Discharge Medication List as of 09/26/2020  4:07 PM    START taking these medications   Details  ALPRAZolam (XANAX) 0.5 MG tablet Take 1 tablet (0.5 mg total) by mouth 3 (three) times daily as needed for sleep or anxiety., Starting Sun 09/26/2020, Until Mon 09/26/2021 at 2359, Print    sertraline (ZOLOFT) 50 MG tablet Take 1 tablet (50 mg total) by mouth daily., Starting Sun 09/26/2020, Until Mon 09/26/2021, Print         Note:  This document was prepared using Dragon voice recognition software and may include unintentional dictation errors.    Versie Starks, PA-C 09/26/20 Ruffin Frederick, MD 09/26/20 [phone]

## 2020-09-26 NOTE — ED Triage Notes (Signed)
Pt reports has been under a lot of stress recently and is having an anxiety attack. Pt states hx of anxiety but it has been while since she had an attack. Pt denies CP or SOB

## 2021-05-23 ENCOUNTER — Ambulatory Visit (LOCAL_COMMUNITY_HEALTH_CENTER): Payer: Medicaid Other

## 2021-05-23 ENCOUNTER — Other Ambulatory Visit: Payer: Self-pay

## 2021-05-23 DIAGNOSIS — Z23 Encounter for immunization: Secondary | ICD-10-CM

## 2021-05-23 NOTE — Progress Notes (Signed)
Patient in Nurse Clinic requesting the Pneumonia booster.  Patient qualified for the patient assistance program r/t Prevnar 20 due to her Diabetes history.  Patient filled out the application (sent for scanning)  and was approved.  She will be due in 2026 for her 2nd Poly 23 vaccine per the [employer].  Copy of the [employer] was given to the patient today. Patient tolerated the Prevnar 20 well today. Dahlia Bailiff, RN

## 2021-05-25 ENCOUNTER — Ambulatory Visit: Payer: Medicaid Other

## 2022-03-08 ENCOUNTER — Other Ambulatory Visit: Payer: Self-pay

## 2022-03-08 ENCOUNTER — Emergency Department
Admission: EM | Admit: 2022-03-08 | Discharge: 2022-03-08 | Disposition: A | Discharge status: Home / Self Care | Payer: Medicaid Other | Attending: Emergency Medicine | Admitting: Emergency Medicine

## 2022-03-08 DIAGNOSIS — M5431 Sciatica, right side: Secondary | ICD-10-CM | POA: Insufficient documentation

## 2022-03-08 MED ORDER — HYDROCODONE-ACETAMINOPHEN 5-325 MG PO TABS: 1.0000 | ORAL_TABLET | ORAL | 0 refills | Status: DC | PRN

## 2022-03-08 MED ORDER — HYDROCODONE-ACETAMINOPHEN 5-325 MG PO TABS
1.0000 | ORAL_TABLET | ORAL | Status: AC
  Administered 2022-03-08: 1 via ORAL
  Filled 2022-03-08: qty 1

## 2022-03-08 MED ORDER — MELOXICAM 15 MG PO TABS: 15.0000 mg | ORAL_TABLET | Freq: Every day | ORAL | 0 refills | Status: DC

## 2022-03-08 NOTE — ED Triage Notes (Signed)
Pt arrives with c/o upper right leg pain that started about 5 days ago. Per pt, the pain starts in her hip area and shoot down to her knee.

## 2022-03-08 NOTE — Discharge Instructions (Signed)
Please take meloxicam and Norco as prescribed as needed for pain.  Call orthopedic office to schedule follow-up appointment in 1 week.  Return to the ER for any worsening symptoms or urgent changes in your health.

## 2022-03-08 NOTE — ED Provider Notes (Signed)
[place] EMERGENCY DEPARTMENT Provider Note   CSN: [id number] Arrival date & time: 03/08/22  1737     History  Chief Complaint  Patient presents with   Leg Pain    Cathy Hughes is a 61 y.o. female presents to the emergency department for evaluation of right leg pain.  She describes burning numbness and tingling in the right lateral hip going down to the lateral aspect of the knee.  Symptoms worse with lying on the hip at night, walking.  She gets some relief with sitting.  She has had a history of sciatica in the past, this did resolve on its own.  She denies any recent falls trauma or injury.  No fevers chills or body aches.  No abdominal pain chest pain or shortness of breath.  She is ambulatory with a cane, she walks with a cane at baseline.  HPI     Home Medications Prior to Admission medications   Medication Sig Start Date End Date Taking? Authorizing Provider  HYDROcodone-acetaminophen (NORCO) 5-325 MG tablet Take 1 tablet by mouth every 4 (four) hours as needed for moderate pain. 03/08/22  Yes Duanne Guess, PA-C  meloxicam (MOBIC) 15 MG tablet Take 1 tablet (15 mg total) by mouth daily. 03/08/22 03/08/23 Yes Duanne Guess, PA-C  albuterol (VENTOLIN HFA) 108 (90 Base) MCG/ACT inhaler Inhale 2-4 puffs by mouth every 4 hours as needed for wheezing, cough, and/or shortness of breath 10/15/19   Loletha Grayer, MD  amLODipine (NORVASC) 10 MG tablet Take 0.5 tablets (5 mg total) by mouth daily. 10/16/19   Loletha Grayer, MD  Ensure Max Protein (ENSURE MAX PROTEIN) LIQD Take 330 mLs (11 oz total) by mouth 2 (two) times daily between meals. Patient not taking: Reported on 05/23/2021 10/15/19   Loletha Grayer, MD  fluticasone [facility]) 50 MCG/ACT nasal spray Place 2 sprays into both nostrils daily. 10/15/19 10/14/20  Loletha Grayer, MD  glipiZIDE (GLUCOTROL) 5 MG tablet Take 1 tablet (5 mg total) by mouth 2 (two) times daily before a meal. Patient not  taking: Reported on 05/23/2021 10/15/19   Loletha Grayer, MD  ipratropium-albuterol (DUONEB) 0.5-2.5 (3) MG/3ML SOLN Take 3 mLs by nebulization every 6 (six) hours as needed. Dx: J44.1 10/15/19   Loletha Grayer, MD  lidocaine (XYLOCAINE) 2 % solution Use as directed 5 mLs in the mouth or throat every 6 (six) hours as needed for mouth pain. Mix with 5 mL of Bromfed-DM for swish and swallow. Patient not taking: Reported on 05/23/2021 05/26/20   Sable Feil, PA-C  meclizine (ANTIVERT) 12.5 MG tablet Take 1 tablet (12.5 mg total) by mouth 3 (three) times daily as needed for dizziness. 10/16/19   Loletha Grayer, MD  montelukast (SINGULAIR) 10 MG tablet Take 1 tablet (10 mg total) by mouth daily. 10/15/19 10/14/20  Loletha Grayer, MD  nystatin (MYCOSTATIN) 100000 UNIT/ML suspension Take 5 mLs (500,000 Units total) by mouth 4 (four) times daily. Patient not taking: Reported on 05/23/2021 10/15/19   Loletha Grayer, MD  omeprazole (PRILOSEC) 20 MG capsule Take 1 capsule (20 mg total) by mouth 2 (two) times daily. 10/15/19 01/13/20  Loletha Grayer, MD  sertraline (ZOLOFT) 50 MG tablet Take 1 tablet (50 mg total) by mouth daily. Patient not taking: Reported on 05/23/2021 09/26/20 09/26/21  Versie Starks, PA-C  sodium chloride flush (NS) 0.9 % SOLN 10 mLs by Intracatheter route every 12 (twelve) hours. Patient not taking: Reported on 05/23/2021 10/15/19   Loletha Grayer, MD  Allergies    Aspirin and Tramadol    Review of Systems   Review of Systems  Physical Exam Updated Vital Signs BP (!) 145/79   Pulse 83   Temp 97.9 F (36.6 C) (Oral)   Resp 19   Wt 89.4 kg   LMP 09/30/2014   SpO2 95%   BMI 32.78 kg/m  Physical Exam Constitutional:      Appearance: She is well-developed.     Comments: Patient ambulatory with a cane.  HENT:     Head: Normocephalic and atraumatic.  Eyes:     Conjunctiva/sclera: Conjunctivae normal.  Cardiovascular:     Rate and Rhythm: Normal rate.  Pulmonary:      Effort: Pulmonary effort is normal. No respiratory distress.  Musculoskeletal:        General: Normal range of motion.     Cervical back: Normal range of motion.     Comments: Patient nontender in the lower lumbar spine.  Normal range of motion with lumbar spine flexion and extension.  She has normal hip range of motion with no discomfort.  She is able to actively straight leg raise of both knees with no weakness and has no weakness with ankle plantarflexion dorsiflexion bilaterally.  No clonus is noted.  Normal sensation throughout lower extremities.  No swelling warmth erythema or edema throughout the lower legs.  Skin:    General: Skin is warm.     Findings: No rash.  Neurological:     Mental Status: She is alert and oriented to person, place, and time.  Psychiatric:        Behavior: Behavior normal.        Thought Content: Thought content normal.     ED Results / Procedures / Treatments   Labs (all labs ordered are listed, but only abnormal results are displayed) Labs Reviewed - No data to display  EKG None  Radiology No results found.  Procedures Procedures    Medications Ordered in ED Medications  HYDROcodone-acetaminophen (NORCO/VICODIN) 5-325 MG per tablet 1 tablet (1 tablet Oral Given 03/08/22 1832)    ED Course/ Medical Decision Making/ A&P                           Medical Decision Making Risk Prescription drug management.   61 year old female with 5 days of right-sided lumbar radicular symptoms.  No weakness or neurological deficits.  Her vital signs are stable, she is afebrile.  She denies any trauma or injury.  No x-rays obtained today.  She is placed on oral anti-inflammatory medication and given Norco for pain.  She will call orthopedic office to schedule follow-up appointment.  She understands signs and symptoms return to the ER for. Final Clinical Impression(s) / ED Diagnoses Final diagnoses:  Sciatica, right side    Rx / DC Orders ED Discharge  Orders          Ordered    HYDROcodone-acetaminophen (NORCO) 5-325 MG tablet  Every 4 hours PRN        03/08/22 1841    meloxicam (MOBIC) 15 MG tablet  Daily        03/08/22 1841              Renata Caprice 03/08/22 1847    Duffy Bruce, MD 03/11/22 [phone]

## 2022-04-27 ENCOUNTER — Encounter: Payer: Self-pay | Admitting: Emergency Medicine

## 2022-04-27 ENCOUNTER — Other Ambulatory Visit: Payer: Self-pay

## 2022-04-27 ENCOUNTER — Emergency Department: Payer: Medicare Other

## 2022-04-27 ENCOUNTER — Emergency Department
Admission: EM | Admit: 2022-04-27 | Discharge: 2022-04-27 | Disposition: A | Discharge status: Home / Self Care | Payer: Medicare Other | Attending: Emergency Medicine | Admitting: Emergency Medicine

## 2022-04-27 DIAGNOSIS — E119 Type 2 diabetes mellitus without complications: Secondary | ICD-10-CM | POA: Insufficient documentation

## 2022-04-27 DIAGNOSIS — S92425A Nondisplaced fracture of distal phalanx of left great toe, initial encounter for closed fracture: Secondary | ICD-10-CM | POA: Diagnosis not present

## 2022-04-27 DIAGNOSIS — I10 Essential (primary) hypertension: Secondary | ICD-10-CM | POA: Insufficient documentation

## 2022-04-27 DIAGNOSIS — S90932A Unspecified superficial injury of left great toe, initial encounter: Secondary | ICD-10-CM | POA: Diagnosis present

## 2022-04-27 DIAGNOSIS — J449 Chronic obstructive pulmonary disease, unspecified: Secondary | ICD-10-CM | POA: Diagnosis not present

## 2022-04-27 DIAGNOSIS — W108XXA Fall (on) (from) other stairs and steps, initial encounter: Secondary | ICD-10-CM | POA: Diagnosis not present

## 2022-04-27 DIAGNOSIS — S92492A Other fracture of left great toe, initial encounter for closed fracture: Secondary | ICD-10-CM

## 2022-04-27 MED ORDER — HYDROCODONE-ACETAMINOPHEN 5-325 MG PO TABS: 1.0000 | ORAL_TABLET | ORAL | 0 refills | Status: DC | PRN

## 2022-04-27 MED ORDER — HYDROCODONE-ACETAMINOPHEN 5-325 MG PO TABS
1.0000 | ORAL_TABLET | Freq: Once | ORAL | Status: AC
  Administered 2022-04-27: 1 via ORAL
  Filled 2022-04-27: qty 1

## 2022-04-27 NOTE — ED Notes (Signed)
See triage note  Presents with pain to left foot  States she tripped and drug her foot to catch herself  Good pulses

## 2022-04-27 NOTE — ED Triage Notes (Signed)
Pt. To ED from home for left foot pain r/t trip down stairs. Pt. States foot dragged and was caught prior to fall.

## 2022-04-27 NOTE — ED Provider Triage Note (Signed)
Emergency Medicine Provider Triage Evaluation Note  Cathy Hughes , a 61 y.o. female  was evaluated in triage.  Pt complains of left foot pain/injury.  Patient missed a step, landing awkwardly on the toes of her left foot.  Complaining of pain primarily along the great toe.  No other injury or complaint.  Review of Systems  Positive: Foot pain/injury Negative: Ankle pain, other injury, open wound  Physical Exam  LMP 09/30/2014  Gen:   Awake, no distress   Resp:  Normal effort  MSK:   Moves extremities without difficulty.  Mild ecchymosis about the great toe of the left foot.  No open wounds.  No obvious Other:  [place].  Medical Decision Making  Medically screening exam initiated at 3:26 PM.  Appropriate orders placed.  Cathy Hughes was informed that the remainder of the evaluation will be completed by another provider, this initial triage assessment does not replace that evaluation, and the importance of remaining in the ED until their evaluation is complete.  Presents to the ED with left foot pain after missing a step. Xray ordered   Darletta Moll, PA-C 04/27/22 1528

## 2022-04-27 NOTE — ED Provider Notes (Signed)
   [facility] Provider Note    Event Date/Time   First MD Initiated Contact with Patient 04/27/22 1614     (approximate)  History   Chief Complaint: Foot Pain (Pt. To ED from home for left foot pain r/t trip down stairs. Pt. States foot dragged and was caught prior to fall. )  HPI  Cathy Hughes is a 61 y.o. female with a past medical history of anxiety, COPD, diabetes, hypertension, presents to the emergency department after a fall.  According to the patient she tripped going down the steps causing her to fall landing on her left knee and left foot.  Patient states pain in the left great toe.  Denies any head.  Denies LOC.  Physical Exam   Triage Vital Signs: ED Triage Vitals  Enc Vitals Group     BP 04/27/22 1529 134/74     Pulse Rate 04/27/22 1529 78     Resp 04/27/22 1529 18     Temp 04/27/22 1529 98.5 F (36.9 C)     Temp Source 04/27/22 1529 Oral     SpO2 04/27/22 1529 98 %     Weight 04/27/22 1530 198 lb (89.8 kg)     Height 04/27/22 1530 '5\' 5"'$  (1.651 m)     Head Circumference --      Peak Flow --      Pain Score 04/27/22 1530 10     Pain Loc --      Pain Edu? --      Excl. in [place]? --     Most recent vital signs: Vitals:   04/27/22 1529  BP: 134/74  Pulse: 78  Resp: 18  Temp: 98.5 F (36.9 C)  SpO2: 98%    General: Awake, no distress.  CV:  Good peripheral perfusion.  Regular rate and rhythm  Resp:  Normal effort.  Equal breath sounds bilaterally.  Abd:  No distention.  Soft, nontender.  No rebound or guarding. Other:  Moderate tenderness to palpation of the left great toe.  Foot otherwise benign.   ED Results / Procedures / Treatments   RADIOLOGY  I have reviewed and interpreted the x-ray patient appears to have a distal phalanx fracture to the intra-articular aspect.   MEDICATIONS ORDERED IN ED: Medications - No data to display   IMPRESSION / MDM / [place] / ED COURSE  I reviewed the triage vital signs  and the nursing notes.  Patient's presentation is most consistent with acute illness / injury with system symptoms.  Patient presents emergency department after a fall.  Patient has pain to her left great toe.  Denies hitting head denies LOC denies pain anywhere else.  Patient has great range of motion all extremities.  Does have moderate tenderness palpation left great toe significant pain with any attempted range of motion.  X-ray shows a distal phalanx intra-articular fracture but largely nondisplaced.  We will place the patient in a postoperative shoe we will have the patient follow-up with orthopedics.  Patient agreeable to plan of care.  FINAL CLINICAL IMPRESSION(S) / ED DIAGNOSES   Left great toe fracture.   Note:  This document was prepared using Dragon voice recognition software and may include unintentional dictation errors.   Harvest Dark, MD 04/27/22 1730

## 2022-10-16 ENCOUNTER — Emergency Department: Payer: Medicare Other

## 2022-10-16 ENCOUNTER — Other Ambulatory Visit: Payer: Self-pay

## 2022-10-16 ENCOUNTER — Emergency Department
Admission: EM | Admit: 2022-10-16 | Discharge: 2022-10-16 | Disposition: A | Discharge status: Home / Self Care | Payer: Medicare Other | Attending: Emergency Medicine | Admitting: Emergency Medicine

## 2022-10-16 DIAGNOSIS — M25552 Pain in left hip: Secondary | ICD-10-CM | POA: Insufficient documentation

## 2022-10-16 DIAGNOSIS — W19XXXA Unspecified fall, initial encounter: Secondary | ICD-10-CM | POA: Insufficient documentation

## 2022-10-16 DIAGNOSIS — F17200 Nicotine dependence, unspecified, uncomplicated: Secondary | ICD-10-CM | POA: Diagnosis not present

## 2022-10-16 DIAGNOSIS — I10 Essential (primary) hypertension: Secondary | ICD-10-CM | POA: Diagnosis not present

## 2022-10-16 DIAGNOSIS — J45909 Unspecified asthma, uncomplicated: Secondary | ICD-10-CM | POA: Diagnosis not present

## 2022-10-16 DIAGNOSIS — E119 Type 2 diabetes mellitus without complications: Secondary | ICD-10-CM | POA: Insufficient documentation

## 2022-10-16 DIAGNOSIS — J449 Chronic obstructive pulmonary disease, unspecified: Secondary | ICD-10-CM | POA: Insufficient documentation

## 2022-10-16 MED ORDER — CYCLOBENZAPRINE HCL 10 MG PO TABS: 10.0000 mg | ORAL_TABLET | Freq: Three times a day (TID) | ORAL | 0 refills | Status: AC | PRN

## 2022-10-16 MED ORDER — LIDOCAINE 5 % EX PTCH: 1.0000 | MEDICATED_PATCH | Freq: Two times a day (BID) | CUTANEOUS | 11 refills | Status: AC

## 2022-10-16 MED ORDER — ACETAMINOPHEN 500 MG PO TABS: 1000.0000 mg | ORAL_TABLET | Freq: Four times a day (QID) | ORAL | 2 refills | Status: DC | PRN

## 2022-10-16 NOTE — ED Notes (Addendum)
Pt telling several staff members that she wants to speak to her nurse. This nurse was informed and consulted with patient. Pt wanting to know when her x-rays will be completed. She was informed that a staff member from radiology will come get her when they are ready to complete her studies.

## 2022-10-16 NOTE — ED Notes (Addendum)
Rad tech at the bedside. Patient ambulatory to radiology with them

## 2022-10-16 NOTE — Discharge Instructions (Addendum)
-  I suspect that this is likely a benign strain/sprain or possibly related to some inflammation surrounding your sciatic nerve, which ones to your hip and down the rest of your leg.  You may continue taking meloxicam for the pain.  In addition, you may take acetaminophen and utilize the lidocaine patches.  You may take the muscle relaxer called cyclobenzaprine, the use caution as it may make you dizzy/drowsy.  If you are too drowsy during the day, you may opt to only taking it at night before bed.  Please review the education material regarding sciatica rehab exercises.  I highly recommend them.  -If your symptoms fail to improve after a few weeks, you may contact the orthopedist listed on this page to schedule an appointment.  -Return to the emergency department anytime if you begin to experience any new or worsening symptoms.

## 2022-10-16 NOTE — ED Triage Notes (Signed)
Patient had a mechanical fall a few days ago injuring her LEFT hip, pain radiates down to her knee (no head trauma, denies loss of consciousness, is not on anticoagulants); Patient is able to ambulate

## 2022-10-16 NOTE — ED Provider Notes (Signed)
[facility] Provider Note    Event Date/Time   First MD Initiated Contact with Patient 10/16/22 1519     (approximate)   History   Chief Complaint Hip Pain (Patient had a mechanical fall a few days ago injuring her LEFT hip, pain radiates down to her knee (no head trauma, denies loss of consciousness, is not on anticoagulants); Patient is able to ambulate)   HPI Cathy Hughes is a 62 y.o. female, history of hypertension, COPD, tobacco use, asthma, anxiety, diabetes, presents to the emergency department for left hip/knee pain following mechanical fall 3 days prior.  Patient states that she accidentally fell on her left side 3 days prior and ever since then has had persistent pain in her left buttocks with extension into her left knee.  She has still been able to ambulate well, though reports difficulty sleeping due to the pain.  Denies any preceding symptoms or head injury during the fall.  No blood thinner usage.  Denies fever/chills, chest pain, shortness breath, abdominal pain, nausea/vomiting, diarrhea, bowel or bladder dysfunction, urinary symptoms, paresthesias, cold sensation, or weakness.  History Limitations: No limitations.        Physical Exam  Triage Vital Signs: ED Triage Vitals  Enc Vitals Group     BP 10/16/22 1358 (!) 147/86     Pulse Rate 10/16/22 1358 72     Resp 10/16/22 1358 19     Temp 10/16/22 1402 98.2 F (36.8 C)     Temp Source 10/16/22 1402 Oral     SpO2 10/16/22 1358 96 %     Weight 10/16/22 1400 204 lb (92.5 kg)     Height 10/16/22 1400 '5\' 5"'$  (1.651 m)     Head Circumference --      Peak Flow --      Pain Score 10/16/22 1359 10     Pain Loc --      Pain Edu? --      Excl. in [place]? --     Most recent vital signs: Vitals:   10/16/22 1358 10/16/22 1402  BP: (!) 147/86   Pulse: 72   Resp: 19   Temp:  98.2 F (36.8 C)  SpO2: 96%     General: Awake, NAD.  Skin: Warm, dry. No rashes or lesions.  Eyes: PERRL.  Conjunctivae normal.  CV: Good peripheral perfusion.  Resp: Normal effort.  Abd: Soft, non-tender. No distention.  Neuro: At baseline. No gross neurological deficits.  Musculoskeletal: Normal ROM of all extremities.  Focused Exam: No gross forms to the left hip or left lower extremity.  PMS intact distally.  She still able to ambulate on her own without assistance.  No marked tenderness along the hip/femur.  Normal range of motion at the knee.  No tenderness along the knee.  Unable to perform straight leg test at this time, patient is currently sitting in a hallway chair.  Physical Exam    ED Results / Procedures / Treatments  Labs (all labs ordered are listed, but only abnormal results are displayed) Labs Reviewed - No data to display   EKG N/A.    RADIOLOGY  ED Provider Interpretation: I personally viewed and interpreted these x-rays, no evidence of fractures on knee or hip x-ray.  DG Knee Complete 4 Views Left  Result Date: 10/16/2022 CLINICAL DATA:  Left knee pain after fall. EXAM: LEFT KNEE - COMPLETE 4+ VIEW COMPARISON:  Left knee radiographs 04/21/2012 FINDINGS: Normal bone mineralization. Joint spaces are  preserved. No joint effusion. Small sclerotic focus within the patella is unchanged from prior and again compatible with a bone island. No acute fracture or dislocation. IMPRESSION: Normal left knee radiographs. Electronically Signed   By: Yvonne Kendall M.D.   On: 10/16/2022 15:57   DG Hip Unilat W or Wo Pelvis 2-3 Views Left  Result Date: 10/16/2022 CLINICAL DATA:  LEFT hip/knee pain after fall. EXAM: DG HIP (WITH OR WITHOUT PELVIS) 2-3V LEFT COMPARISON:  None Available. FINDINGS: Osseous alignment is normal. No fracture line or displaced fracture fragment is seen. Soft tissues about the pelvis and LEFT hip are unremarkable. Incidental note is made of advanced degenerative change within the lower lumbar spine, incompletely imaged. IMPRESSION: 1. No acute findings. No osseous  fracture or dislocation. 2. 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 Electronically Signed   By: Franki Cabot M.D.   On: 10/16/2022 15:56    PROCEDURES:  Critical Care performed: N/A.  Procedures    MEDICATIONS ORDERED IN ED: Medications - No data to display   IMPRESSION / MDM / [place] / ED COURSE  I reviewed the triage vital signs and the nursing notes.                              Differential diagnosis includes, but is not limited to, piriformis syndrome, bursitis, osteoarthritis, sciatica, lumbar radiculopathy, hip dislocation, hip fracture, femur fracture, patella fracture, knee sprain, meralgia paresthetica.  Assessment/Plan Patient presents with left hip/knee pain following mechanical fall 3 days prior.  She appears well clinically.  Vitals within normal limits.  Physical exam does not show any signs of major injuries.  Her x-rays not show any evidence of acute fractures or dislocations in the hip or knee.  I suspect that she likely has sprain/strain pathology versus lumbar radiculopathy/sciatica.  Suspicion for any occult pathology warranting advanced imaging.  She is currently taking meloxicam for the pain, though this is not helping.  Her with a prescription for cyclobenzaprin, and lidocaine patches, as well as rehabilitation exercises for possible sciatica.  Will provide her with a referral to orthopedics to follow-up with if her symptoms fail to improve after 3 to 4 weeks.  She was amenable to this.  Will discharge.  Provided the patient with anticipatory guidance, return precautions, and educational material. Encouraged the patient to return to the emergency department at any time if they begin to experience any new or worsening symptoms. Patient expressed understanding and agreed with the plan.   Patient's presentation is most consistent with acute complicated illness / injury requiring diagnostic workup.       FINAL  CLINICAL IMPRESSION(S) / ED DIAGNOSES   Final diagnoses:  Left hip pain     Rx / DC Orders   ED Discharge Orders          Ordered    cyclobenzaprine (FLEXERIL) 10 MG tablet  3 times daily PRN        10/16/22 1628    lidocaine (LIDODERM) 5 %  Every 12 hours        10/16/22 1628    acetaminophen (TYLENOL) 500 MG tablet  Every 6 hours PRN        10/16/22 1628             Note:  This document was prepared using Dragon voice recognition software and may include unintentional dictation errors.   Teodoro Spray, [place] 10/16/22 [phone]  Naaman Plummer, MD 10/16/22 2159

## 2022-12-12 ENCOUNTER — Ambulatory Visit: Payer: Medicare Other | Attending: Infectious Diseases | Admitting: Infectious Diseases

## 2022-12-12 ENCOUNTER — Encounter: Payer: Self-pay | Admitting: Infectious Diseases

## 2022-12-12 ENCOUNTER — Other Ambulatory Visit
Admission: RE | Admit: 2022-12-12 | Discharge: 2022-12-12 | Disposition: A | Discharge status: Home / Self Care | Payer: Medicare Other | Source: Ambulatory Visit | Attending: Infectious Diseases | Admitting: Infectious Diseases

## 2022-12-12 VITALS — BP 122/73 | HR 73 | Temp 96.6°F | Ht 64.0 in | Wt 203.0 lb

## 2022-12-12 DIAGNOSIS — Z9049 Acquired absence of other specified parts of digestive tract: Secondary | ICD-10-CM | POA: Insufficient documentation

## 2022-12-12 DIAGNOSIS — Z8661 Personal history of infections of the central nervous system: Secondary | ICD-10-CM | POA: Diagnosis not present

## 2022-12-12 DIAGNOSIS — G9601 Cranial cerebrospinal fluid leak, spontaneous: Secondary | ICD-10-CM

## 2022-12-12 LAB — CBC WITH DIFFERENTIAL/PLATELET
Abs Immature Granulocytes: 0.01 10*3/uL (ref 0.00–0.07)
Basophils Absolute: 0.1 10*3/uL (ref 0.0–0.1)
Basophils Relative: 1 %
Eosinophils Absolute: 0.2 10*3/uL (ref 0.0–0.5)
Eosinophils Relative: 2 %
HCT: 43.7 % (ref 36.0–46.0)
Hemoglobin: 14.2 g/dL (ref 12.0–15.0)
Immature Granulocytes: 0 %
Lymphocytes Relative: 42 %
Lymphs Abs: 3.3 10*3/uL (ref 0.7–4.0)
MCH: 26.1 pg (ref 26.0–34.0)
MCHC: 32.5 g/dL (ref 30.0–36.0)
MCV: 80.2 fL (ref 80.0–100.0)
Monocytes Absolute: 0.2 10*3/uL (ref 0.1–1.0)
Monocytes Relative: 3 %
Neutro Abs: 4.1 10*3/uL (ref 1.7–7.7)
Neutrophils Relative %: 52 %
Platelets: 373 10*3/uL (ref 150–400)
RBC: 5.45 MIL/uL — ABNORMAL HIGH (ref 3.87–5.11)
RDW: 14.6 % (ref 11.5–15.5)
WBC: 7.9 10*3/uL (ref 4.0–10.5)
nRBC: 0 % (ref 0.0–0.2)

## 2022-12-12 LAB — SEDIMENTATION RATE: Sed Rate: 27 mm/hr (ref 0–30)

## 2022-12-12 LAB — COMPREHENSIVE METABOLIC PANEL
ALT: 20 U/L (ref 0–44)
AST: 21 U/L (ref 15–41)
Albumin: 4.3 g/dL (ref 3.5–5.0)
Alkaline Phosphatase: 88 U/L (ref 38–126)
Anion gap: 9 (ref 5–15)
BUN: 15 mg/dL (ref 6–20)
CO2: 26 mmol/L (ref 22–32)
Calcium: 9.8 mg/dL (ref 8.9–10.3)
Chloride: 107 mmol/L (ref 98–111)
Creatinine, Ser: 0.73 mg/dL (ref 0.44–1.00)
GFR, Estimated: >60 mL/min (ref >60)
Glucose, Bld: 186 mg/dL — ABNORMAL HIGH (ref 70–99)
Potassium: 4.3 mmol/L (ref 3.5–5.1)
Sodium: 142 mmol/L (ref 135–145)
Total Bilirubin: 0.5 mg/dL (ref 0.3–1.2)
Total Protein: 8.5 g/dL — ABNORMAL HIGH (ref 6.5–8.1)

## 2022-12-12 LAB — C-REACTIVE PROTEIN: CRP: 3.4 mg/dL — ABNORMAL HIGH (ref <1.0)

## 2022-12-12 MED ORDER — AMOXICILLIN 500 MG PO CAPS: 1000.0000 mg | ORAL_CAPSULE | Freq: Three times a day (TID) | ORAL | 0 refills | Status: DC

## 2022-12-12 NOTE — Progress Notes (Signed)
NAME: Cathy Hughes  DOB: 04-05-1961  MRN: [id number]  Date/Time: 12/12/2022 9:14 AM  REQUESTING PROVIDER Subjective:   ?pt is referred to me by her PCP for possible csf leak  Cathy Hughes is a 62 y.o. 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 she took Prevnar 20 vaccine  in 2022  She is here concerned that she has watering from the left nostril and worried that this may lead to meningitis The watering started a month ago- not continuous or constant- She has it some times when she is in bed She has no fever Has some headache on the left side and some fullness She lost her husband last year and lives on her own   Past Medical History:  Diagnosis Date   Anxiety    Asthma    COPD (chronic obstructive pulmonary disease)    Diabetes mellitus without complication    Hypertension    Pre-diabetes     Past Surgical History:  Procedure Laterality Date   CHOLECYSTECTOMY     COLON SURGERY     TEE WITHOUT CARDIOVERSION N/A 10/15/2019   Procedure: TRANSESOPHAGEAL ECHOCARDIOGRAM (TEE);  Surgeon: Kate Sable, MD;  Location: [facility] ORS;  Service: Cardiovascular;  Laterality: N/A;   thumb surgery      Social History   Socioeconomic History   Marital status: Married    Spouse name: Clyde   Number of children: 3   Years of education: Not on file   Highest education level: Not on file  Occupational History   Not on file  Tobacco Use   Smoking status: Former    Packs/day: 0.50    Years: 35.00    Additional pack years: 0.00    Total pack years: 17.50    Types: Cigarettes   Smokeless tobacco: Never   Tobacco comments:    Quit 10/05/2019  Vaping Use   Vaping Use: Former   Quit date: 06/25/2018  Substance and Sexual Activity   Alcohol use: Yes    Comment: occ   Drug use: No   Sexual activity: Yes    Partners: Male   Other Topics Concern   Not on file  Social History Narrative   Not on file   Social Determinants of Health   Financial Resource Strain: Low Risk  (07/31/2018)   Overall Financial Resource Strain (CARDIA)    Difficulty of Paying Living Expenses: Not hard at all  Food Insecurity: No Food Insecurity (07/31/2018)   Hunger Vital Sign    Worried About Running Out of Food in the Last Year: Never true    [place] in the Last Year: Never true  Transportation Needs: No Transportation Needs (07/31/2018)   PRAPARE - Hydrologist (Medical): No    Lack of Transportation (Non-Medical): No  Physical Activity: Sufficiently Active (07/31/2018)   Exercise Vital Sign    Days of Exercise per Week: 5 days    Minutes of Exercise per Session: 40 min  Stress: No Stress Concern Present (07/31/2018)   [place]    Feeling of Stress : Only a little  Social Connections: Socially Integrated (07/31/2018)   Social Connection and Isolation Panel [NHANES]    Frequency of Communication with Friends and Family: More than three times a week    Frequency of Social  Gatherings with Friends and Family: More than three times a week    Attends Religious Services: More than 4 times per year    Active Member of Clubs or Organizations: Yes    Attends Music therapist: More than 4 times per year    Marital Status: Married  Human resources officer Violence: Not At Risk (07/31/2018)   Humiliation, Afraid, Rape, and Kick questionnaire    Fear of Current or Ex-Partner: No    Emotionally Abused: No    Physically Abused: No    Sexually Abused: No    Family History  Problem Relation Age of Onset   Diabetes Mother    Hypertension Mother    Diabetes Father    Hypertension Father    Allergies  Allergen Reactions   Aspirin Hives   Tramadol Other (See Comments)    Headache    I? Current Outpatient Medications   Medication Sig Dispense Refill   acetaminophen (TYLENOL) 500 MG tablet Take 2 tablets (1,000 mg total) by mouth every 6 (six) hours as needed. 100 tablet 2   albuterol (VENTOLIN HFA) 108 (90 Base) MCG/ACT inhaler Inhale 2-4 puffs by mouth every 4 hours as needed for wheezing, cough, and/or shortness of breath 18 g 0   ALPRAZolam (XANAX) 0.5 MG tablet Take 0.5 mg by mouth 2 (two) times daily as needed.     amitriptyline (ELAVIL) 10 MG tablet Take 10 mg by mouth at bedtime.     amLODipine (NORVASC) 10 MG tablet Take 0.5 tablets (5 mg total) by mouth daily. 30 tablet 0   Ensure Max Protein (ENSURE MAX PROTEIN) LIQD Take 330 mLs (11 oz total) by mouth 2 (two) times daily between meals. 660 mL 0   ipratropium-albuterol (DUONEB) 0.5-2.5 (3) MG/3ML SOLN Take 3 mLs by nebulization every 6 (six) hours as needed. Dx: J44.1 180 mL 0   lidocaine (LIDODERM) 5 % Place 1 patch onto the skin every 12 (twelve) hours. Remove & Discard patch within 12 hours or as directed by MD 60 patch 11   lidocaine (XYLOCAINE) 2 % solution Use as directed 5 mLs in the mouth or throat every 6 (six) hours as needed for mouth pain. Mix with 5 mL of Bromfed-DM for swish and swallow. 100 mL 0   meclizine (ANTIVERT) 12.5 MG tablet Take 1 tablet (12.5 mg total) by mouth 3 (three) times daily as needed for dizziness. 20 tablet 0   nystatin (MYCOSTATIN) 100000 UNIT/ML suspension Take 5 mLs (500,000 Units total) by mouth 4 (four) times daily. 60 mL 0   sodium chloride flush (NS) 0.9 % SOLN 10 mLs by Intracatheter route every 12 (twelve) hours. 15 Syringe 0   fluticasone (FLONASE) 50 MCG/ACT nasal spray Place 2 sprays into both nostrils daily. 16 g 0   montelukast (SINGULAIR) 10 MG tablet Take 1 tablet (10 mg total) by mouth daily. 30 tablet 0   omeprazole (PRILOSEC) 20 MG capsule Take 1 capsule (20 mg total) by mouth 2 (two) times daily. 60 capsule 0   sertraline (ZOLOFT) 50 MG tablet Take 1 tablet (50 mg total) by mouth daily. (Patient not  taking: Reported on 05/23/2021) 30 tablet 2   No current facility-administered medications for this visit.     Abtx:  Anti-infectives (From admission, onward)    None       REVIEW OF SYSTEMS:  Const: negative fever, negative chills, negative weight loss Eyes: negative diplopia or visual changes, negative eye pain ENT: negative coryza, negative sore throat, has  rhinorrhea Resp: negative cough, hemoptysis, dyspnea Cards: negative for chest pain, palpitations, lower extremity edema GU: negative for frequency, dysuria and hematuria GI: Negative for abdominal pain, diarrhea, bleeding, constipation Skin: negative for rash and pruritus Heme: negative for easy bruising and gum/nose bleeding MS: negative for myalgias, arthralgias, back pain and muscle weakness Neurolo:negative for headaches, dizziness, vertigo, memory problems  Psych: anxiety, depression since her husband passed away Endocrine: negative for thyroid, diabetes Allergy/Immunology- aspirin and tramadol Objective:  VITALS:  BP 122/73   Pulse 73   Temp (!) 96.6 F (35.9 C) (Temporal)   Ht 5\' 4"  (1.626 m)   Wt 203 lb (92.1 kg)   LMP 09/30/2014   BMI 34.84 kg/m   PHYSICAL EXAM:  General: Alert, cooperative, no distress, appears stated age.  Head: Normocephalic, without obvious abnormality, atraumatic. Eyes: Conjunctivae clear, anicteric sclerae. Pupils are equal ENT Nares normal. No drainage or sinus tenderness. Did not see any fluid in her nose or ears Rt ear has wax Lips, mucosa, and tongue normal. No Thrush Neck: Supple, symmetrical, no adenopathy, thyroid: non tender no carotid bruit and no JVD. Back: No CVA tenderness. Lungs: Clear to auscultation bilaterally. No Wheezing or Rhonchi. No rales. Heart: Regular rate and rhythm, no murmur, rub or gallop. Abdomen: Soft, non-tender,not distended. Bowel sounds normal. No masses Extremities: atraumatic, no cyanosis. No edema. No clubbing Skin: No rashes or lesions.  Or bruising Lymph: Cervical, supraclavicular normal. Neurologic: Grossly non-focal Pertinent Labs none   IMAGING RESULTS: none ? Impression/Recommendation ?62 yr female with h/o pneumococcal meningitis in Jan 2021 which was treated and also got pneumococcal vaccine 20 in 2022, presents with concern for csf leak- says she has intermittent clear fluid from left nostril. Does not happen every day None today Clinically no deficit Will give her a sterile cup to collect fluid and test for Beta 2 transferrin Will check CBC/CMP/ESR/CRP Will need MRI of skull base- will discuss with radiologist regarding the correct imaging Will refer to ENT  Told her to check temp every day If 100 to report to ED If leaking increases to report to ED Will send prescription for amoxicillin 500mg  -1 gram PO TID to start if leak increases ? ? ___________________________________________________ Discussed with patient in detail Also briefly discussed with sister in the waiting room Note:  This document was prepared using Dragon voice recognition software and may include unintentional dictation errors.

## 2022-12-12 NOTE — Patient Instructions (Signed)
You are here concerned that you have watery discharge from the left nostril- please collect the fluid so that we can do a special test- also we will do labs today. Check temperature every day. May need MRI I have sent amoxicillin prescription to your pharmacy- if you have excess fluid start taking 1 cap three times a day. If you have any fever  100 go to the emergency dept

## 2022-12-28 ENCOUNTER — Other Ambulatory Visit: Payer: Self-pay | Admitting: Infectious Diseases

## 2022-12-28 DIAGNOSIS — G9601 Cranial cerebrospinal fluid leak, spontaneous: Secondary | ICD-10-CM

## 2022-12-28 DIAGNOSIS — G9600 Cerebrospinal fluid leak, unspecified: Secondary | ICD-10-CM

## 2023-01-03 ENCOUNTER — Ambulatory Visit
Admission: RE | Admit: 2023-01-03 | Discharge: 2023-01-03 | Disposition: A | Discharge status: Home / Self Care | Payer: Medicare Other | Source: Ambulatory Visit | Attending: Infectious Diseases | Admitting: Infectious Diseases

## 2023-01-03 DIAGNOSIS — G9601 Cranial cerebrospinal fluid leak, spontaneous: Secondary | ICD-10-CM | POA: Diagnosis present

## 2023-01-10 ENCOUNTER — Ambulatory Visit: Payer: Medicare Other

## 2023-01-15 ENCOUNTER — Ambulatory Visit: Payer: Medicare Other

## 2023-01-17 ENCOUNTER — Telehealth: Payer: Self-pay

## 2023-01-17 NOTE — Telephone Encounter (Signed)
-----   Message from Lynn Ito, MD sent at 01/17/2023  1:32 PM EDT ----- Please let her know the CT scan of the sinus was fine with no breach .thx  ----- Message ----- From: Interface, Rad Results In Sent: 01/04/2023   3:54 AM EDT To: Lynn Ito, MD

## 2023-01-17 NOTE — Telephone Encounter (Signed)
Patient informed of CT results and verbalized understanding. Cathy Hughes T Pricilla Loveless

## 2023-02-14 ENCOUNTER — Other Ambulatory Visit: Payer: Self-pay | Admitting: Family Medicine

## 2023-02-14 DIAGNOSIS — Z1231 Encounter for screening mammogram for malignant neoplasm of breast: Secondary | ICD-10-CM

## 2023-02-15 ENCOUNTER — Other Ambulatory Visit
Admission: RE | Admit: 2023-02-15 | Discharge: 2023-02-15 | Disposition: A | Discharge status: Home / Self Care | Payer: Medicare Other | Source: Ambulatory Visit | Attending: Family Medicine | Admitting: Family Medicine

## 2023-02-15 DIAGNOSIS — E1169 Type 2 diabetes mellitus with other specified complication: Secondary | ICD-10-CM | POA: Insufficient documentation

## 2023-02-15 DIAGNOSIS — E1165 Type 2 diabetes mellitus with hyperglycemia: Secondary | ICD-10-CM | POA: Insufficient documentation

## 2023-02-15 LAB — BASIC METABOLIC PANEL
Anion gap: 10 (ref 5–15)
BUN: 13 mg/dL (ref 6–20)
CO2: 22 mmol/L (ref 22–32)
Calcium: 9.3 mg/dL (ref 8.9–10.3)
Chloride: 110 mmol/L (ref 98–111)
Creatinine, Ser: 0.79 mg/dL (ref 0.44–1.00)
GFR, Estimated: >60 mL/min (ref >60)
Glucose, Bld: 191 mg/dL — ABNORMAL HIGH (ref 70–99)
Potassium: 3.7 mmol/L (ref 3.5–5.1)
Sodium: 142 mmol/L (ref 135–145)

## 2023-02-15 LAB — HEMOGLOBIN A1C
Hgb A1c MFr Bld: 9 % — ABNORMAL HIGH (ref 4.8–5.6)
Mean Plasma Glucose: 211.6 mg/dL

## 2023-02-26 ENCOUNTER — Telehealth: Payer: Self-pay | Admitting: Physical Medicine and Rehabilitation

## 2023-02-26 NOTE — Telephone Encounter (Signed)
Patient called needing to schedule an appointment with Dr. Alvester Morin. The number to contact patient is [phone]

## 2023-03-01 ENCOUNTER — Other Ambulatory Visit: Payer: Self-pay

## 2023-03-01 ENCOUNTER — Emergency Department: Payer: Medicare Other

## 2023-03-01 ENCOUNTER — Emergency Department
Admission: EM | Admit: 2023-03-01 | Discharge: 2023-03-01 | Disposition: A | Discharge status: Home / Self Care | Payer: Medicare Other | Attending: Emergency Medicine | Admitting: Emergency Medicine

## 2023-03-01 DIAGNOSIS — J45909 Unspecified asthma, uncomplicated: Secondary | ICD-10-CM | POA: Insufficient documentation

## 2023-03-01 DIAGNOSIS — M79641 Pain in right hand: Secondary | ICD-10-CM | POA: Insufficient documentation

## 2023-03-01 DIAGNOSIS — I10 Essential (primary) hypertension: Secondary | ICD-10-CM | POA: Diagnosis not present

## 2023-03-01 DIAGNOSIS — J449 Chronic obstructive pulmonary disease, unspecified: Secondary | ICD-10-CM | POA: Insufficient documentation

## 2023-03-01 MED ORDER — HYDROCODONE-ACETAMINOPHEN 5-325 MG PO TABS
1.0000 | ORAL_TABLET | Freq: Four times a day (QID) | ORAL | 0 refills | Status: DC | PRN
  Filled 2023-03-01: qty 12, 3d supply, fill #0

## 2023-03-01 MED ORDER — HYDROCODONE-ACETAMINOPHEN 5-325 MG PO TABS: 1.0000 | ORAL_TABLET | Freq: Four times a day (QID) | ORAL | 0 refills | Status: AC | PRN

## 2023-03-01 NOTE — Telephone Encounter (Signed)
Spoke with patient and scheduled OV for 03/07/23.

## 2023-03-01 NOTE — ED Triage Notes (Signed)
Pt here with right hand and wrist pain x 2 weeks. Pt states the pain starts with her middle finger and radiates up to her wrist. Pt states at first the pain was intermittent but now is constant. Pt denies injury.

## 2023-03-01 NOTE — ED Notes (Signed)
See triage notes. Patient c/o right wrist pain times three weeks. Reports no injury

## 2023-03-02 ENCOUNTER — Other Ambulatory Visit: Payer: Self-pay

## 2023-03-02 NOTE — ED Provider Notes (Signed)
   [facility] Provider Note    Event Date/Time   First MD Initiated Contact with Patient 03/01/23 1212     (approximate)   History   Hand Pain   HPI  Cathy Hughes is a 62 y.o. female with history of hypertension, COPD, asthma and as listed in EMR presents to the emergency department for evaluation of right hand and wrist pain for the past couple of weeks.  No known injury.  Pain is worse with movement of the thumb, index finger, and her middle finger locks on occasion.  No alleviating measures attempted.      Physical Exam   Triage Vital Signs: ED Triage Vitals [03/01/23 1137]  Enc Vitals Group     BP (!) 114/102     Pulse Rate 71     Resp 18     Temp 97.8 F (36.6 C)     Temp Source Oral     SpO2 98 %     Weight 203 lb 0.7 oz (92.1 kg)     Height 5\' 4"  (1.626 m)     Head Circumference      Peak Flow      Pain Score 10     Pain Loc      Pain Edu?      Excl. in GC?     Most recent vital signs: Vitals:   03/01/23 1137  BP: (!) 114/102  Pulse: 71  Resp: 18  Temp: 97.8 F (36.6 C)  SpO2: 98%    General: Awake, no distress.  CV:  Good peripheral perfusion.  Resp:  Normal effort.  Abd:  No distention.  Other:  No swelling or erythema noted over the right hand.  She is able to demonstrate full range of motion of the fingers.  No focal snuffbox tenderness.   ED Results / Procedures / Treatments   Labs (all labs ordered are listed, but only abnormal results are displayed) Labs Reviewed - No data to display   EKG  Not indicated   RADIOLOGY  Image and radiology report reviewed and interpreted by me. Radiology report consistent with the same.  Image of the right hand is negative for acute concerns.  PROCEDURES:  Critical Care performed: No  Procedures   MEDICATIONS ORDERED IN ED:  Medications - No data to display   IMPRESSION / MDM / ASSESSMENT AND PLAN / ED COURSE   I have reviewed the triage  note.  Differential diagnosis includes, but is not limited to, tendinitis, arthritis, gout  Patient's presentation is most consistent with acute complicated illness / injury requiring diagnostic workup.  62 year old female presenting to the emergency department for treatment and evaluation of nontraumatic right hand pain.  See HPI for further details.  Exam and imaging are both negative.  Plan will be to place her in a prefabricated wrist splint and have her follow-up with primary care or orthopedics if symptoms do not improve with time, rest, and medication.      FINAL CLINICAL IMPRESSION(S) / ED DIAGNOSES   Final diagnoses:  Pain of right hand     Rx / DC Orders       Note:  This document was prepared using Dragon voice recognition software and may include unintentional dictation errors.   Chinita Pester, FNP 03/02/23 1507    Sharman Cheek, MD 03/03/23 [phone]

## 2023-03-07 ENCOUNTER — Ambulatory Visit: Payer: Medicare Other | Admitting: Physical Medicine and Rehabilitation

## 2023-03-14 ENCOUNTER — Other Ambulatory Visit: Payer: Self-pay | Admitting: Otolaryngology

## 2023-03-14 DIAGNOSIS — R519 Headache, unspecified: Secondary | ICD-10-CM

## 2023-04-04 ENCOUNTER — Ambulatory Visit
Admission: RE | Admit: 2023-04-04 | Discharge: 2023-04-04 | Disposition: A | Discharge status: Home / Self Care | Payer: Medicare Other | Source: Ambulatory Visit | Attending: Otolaryngology | Admitting: Otolaryngology

## 2023-04-04 DIAGNOSIS — R519 Headache, unspecified: Secondary | ICD-10-CM

## 2023-04-20 ENCOUNTER — Ambulatory Visit: Payer: Medicare Other | Admitting: Family Medicine

## 2023-05-02 ENCOUNTER — Ambulatory Visit
Admission: RE | Admit: 2023-05-02 | Discharge: 2023-05-02 | Disposition: A | Discharge status: Home / Self Care | Payer: Medicare Other | Source: Ambulatory Visit | Attending: Otolaryngology | Admitting: Otolaryngology

## 2023-05-02 ENCOUNTER — Other Ambulatory Visit: Payer: Self-pay | Admitting: Otolaryngology

## 2023-05-02 DIAGNOSIS — R519 Headache, unspecified: Secondary | ICD-10-CM

## 2023-05-02 MED ORDER — GADOPICLENOL 0.5 MMOL/ML IV SOLN: 9.0000 mL | Freq: Once | INTRAVENOUS | Status: DC | PRN

## 2023-06-07 ENCOUNTER — Ambulatory Visit: Payer: Medicare Other

## 2023-06-21 ENCOUNTER — Ambulatory Visit: Payer: Medicare Other | Attending: Neurology

## 2023-06-28 ENCOUNTER — Ambulatory Visit: Payer: Medicare Other

## 2023-07-05 ENCOUNTER — Ambulatory Visit: Payer: Medicare Other

## 2023-09-18 ENCOUNTER — Ambulatory Visit: Payer: Self-pay | Admitting: Nurse Practitioner

## 2023-10-06 ENCOUNTER — Telehealth: Payer: Medicare Other

## 2023-10-06 DIAGNOSIS — J449 Chronic obstructive pulmonary disease, unspecified: Secondary | ICD-10-CM | POA: Diagnosis not present

## 2023-10-06 DIAGNOSIS — J440 Chronic obstructive pulmonary disease with acute lower respiratory infection: Secondary | ICD-10-CM

## 2023-10-06 DIAGNOSIS — J209 Acute bronchitis, unspecified: Secondary | ICD-10-CM | POA: Diagnosis not present

## 2023-10-06 MED ORDER — AZITHROMYCIN 250 MG PO TABS: ORAL_TABLET | ORAL | 0 refills | Status: AC

## 2023-10-06 MED ORDER — ALBUTEROL SULFATE HFA 108 (90 BASE) MCG/ACT IN AERS: INHALATION_SPRAY | RESPIRATORY_TRACT | 0 refills | Status: AC

## 2023-10-06 MED ORDER — PREDNISONE 20 MG PO TABS: 40.0000 mg | ORAL_TABLET | Freq: Every day | ORAL | 0 refills | Status: AC

## 2023-10-06 MED ORDER — PSEUDOEPH-BROMPHEN-DM 30-2-10 MG/5ML PO SYRP: 5.0000 mL | ORAL_SOLUTION | Freq: Four times a day (QID) | ORAL | 0 refills | Status: DC | PRN

## 2023-10-06 NOTE — Progress Notes (Signed)
Virtual Visit Consent   [name], you are scheduled for a virtual visit with a [facility] provider today. Just as with appointments in the office, your consent must be obtained to participate. Your consent will be active for this visit and any virtual visit you may have with one of our providers in the next 365 days. If you have a MyChart account, a copy of this consent can be sent to you electronically.  As this is a virtual visit, video technology does not allow for your provider to perform a traditional examination. This may limit your provider's ability to fully assess your condition. If your provider identifies any concerns that need to be evaluated in person or the need to arrange testing (such as labs, EKG, etc.), we will make arrangements to do so. Although advances in technology are sophisticated, we cannot ensure that it will always work on either your end or our end. If the connection with a video visit is poor, the visit may have to be switched to a telephone visit. With either a video or telephone visit, we are not always able to ensure that we have a secure connection.  By engaging in this virtual visit, you consent to the provision of healthcare and authorize for your insurance to be billed (if applicable) for the services provided during this visit. Depending on your insurance coverage, you may receive a charge related to this service.  I need to obtain your verbal consent now. Are you willing to proceed with your visit today? Cathy Hughes has provided verbal consent on 10/06/2023 for a virtual visit (video or telephone). Claiborne Rigg, NP  Date: 10/06/2023 1:10 PM  Virtual Visit via Video Note   I, Claiborne Rigg, connected with  Cathy Hughes  ([id number], [birth year]) on 10/06/23 at  1:00 PM EST by a video-enabled telemedicine application and verified that I am speaking with the correct person using two identifiers.  Location: Patient: Virtual Visit Location  Patient: Home Provider: Virtual Visit Location Provider: Home Office   I discussed the limitations of evaluation and management by telemedicine and the availability of in person appointments. The patient expressed understanding and agreed to proceed.    History of Present Illness: Cathy Hughes is a 63 y.o. who identifies as a female who was assigned female at birth, and is being seen today for bronchitis.  Ms. Robicheaux notes the following symptoms for over 10 days: runny nose, persistent productive cough and chest pain with coughing. Cough is keeping her up at night.  She has a history of COPD and stopped smoking 4 years ago.    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   Perennial allergic rhinitis 10/06/2019   Tobacco abuse 10/06/2019   Sepsis (HCC) 10/05/2019   Acute metabolic encephalopathy 10/05/2019   Hyperglycemia due to type 2 diabetes mellitus (HCC) 10/05/2019   Left ear pain 10/05/2019   COPD (chronic obstructive pulmonary disease) (HCC) 07/31/2018   Class 1 obesity due to excess calories with serious comorbidity and body mass index (BMI) of 34.0 to 34.9 in adult 07/31/2018   HTN (hypertension) 12/09/2013    Allergies:  Allergies  Allergen Reactions   Aspirin Hives   Tramadol Other (See Comments)    Headache    Medications:  Current Outpatient Medications:  azithromycin (ZITHROMAX) 250 MG tablet, Take 2 tablets on day 1, then 1 tablet daily on days 2 through 5, Disp: 6 tablet, Rfl: 0   brompheniramine-pseudoephedrine-DM 30-2-10 MG/5ML syrup, Take 5 mLs by mouth 4 (four) times daily as needed., Disp: 240 mL, Rfl: 0   predniSONE (DELTASONE) 20 MG tablet, Take 2 tablets (40 mg total) by mouth daily with breakfast for 5 days., Disp: 10 tablet, Rfl: 0   albuterol (VENTOLIN HFA) 108 (90 Base) MCG/ACT inhaler, Inhale  2-4 puffs by mouth every 4 hours as needed for wheezing, cough, and/or shortness of breath, Disp: 18 g, Rfl: 0   ALPRAZolam (XANAX) 0.5 MG tablet, Take 0.5 mg by mouth 2 (two) times daily as needed., Disp: , Rfl:    amitriptyline (ELAVIL) 10 MG tablet, Take 10 mg by mouth at bedtime., Disp: , Rfl:    amLODipine (NORVASC) 10 MG tablet, Take 0.5 tablets (5 mg total) by mouth daily., Disp: 30 tablet, Rfl: 0   amoxicillin (AMOXIL) 500 MG capsule, Take 2 capsules (1,000 mg total) by mouth 3 (three) times daily., Disp: 42 capsule, Rfl: 0   Ensure Max Protein (ENSURE MAX PROTEIN) LIQD, Take 330 mLs (11 oz total) by mouth 2 (two) times daily between meals., Disp: 660 mL, Rfl: 0   fluticasone (FLONASE) 50 MCG/ACT nasal spray, Place 2 sprays into both nostrils daily., Disp: 16 g, Rfl: 0   ipratropium-albuterol (DUONEB) 0.5-2.5 (3) MG/3ML SOLN, Take 3 mLs by nebulization every 6 (six) hours as needed. Dx: J44.1, Disp: 180 mL, Rfl: 0   lidocaine (LIDODERM) 5 %, Place 1 patch onto the skin every 12 (twelve) hours. Remove & Discard patch within 12 hours or as directed by MD, Disp: 60 patch, Rfl: 11   lidocaine (XYLOCAINE) 2 % solution, Use as directed 5 mLs in the mouth or throat every 6 (six) hours as needed for mouth pain. Mix with 5 mL of Bromfed-DM for swish and swallow., Disp: 100 mL, Rfl: 0   meclizine (ANTIVERT) 12.5 MG tablet, Take 1 tablet (12.5 mg total) by mouth 3 (three) times daily as needed for dizziness., Disp: 20 tablet, Rfl: 0   montelukast (SINGULAIR) 10 MG tablet, Take 1 tablet (10 mg total) by mouth daily., Disp: 30 tablet, Rfl: 0   nystatin (MYCOSTATIN) 100000 UNIT/ML suspension, Take 5 mLs (500,000 Units total) by mouth 4 (four) times daily., Disp: 60 mL, Rfl: 0   omeprazole (PRILOSEC) 20 MG capsule, Take 1 capsule (20 mg total) by mouth 2 (two) times daily., Disp: 60 capsule, Rfl: 0   sertraline (ZOLOFT) 50 MG tablet, Take 1 tablet (50 mg total) by mouth daily. (Patient not taking: Reported on  05/23/2021), Disp: 30 tablet, Rfl: 2   sodium chloride flush (NS) 0.9 % SOLN, 10 mLs by Intracatheter route every 12 (twelve) hours., Disp: 15 Syringe, Rfl: 0  Observations/Objective: Patient is well-developed, well-nourished in no acute distress.  Resting comfortably at home.  Head is normocephalic, atraumatic.  No labored breathing.  Speech is clear and coherent with logical content.  Patient is alert and oriented at baseline.    Assessment and Plan: 1. Acute bronchitis with COPD (HCC) (Primary) - brompheniramine-pseudoephedrine-DM 30-2-10 MG/5ML syrup; Take 5 mLs by mouth 4 (four) times daily as needed.  Dispense: 240 mL; Refill: 0 - predniSONE (DELTASONE) 20 MG tablet; Take 2 tablets (40 mg total) by mouth daily with breakfast for 5 days.  Dispense: 10 tablet; Refill: 0 - azithromycin (ZITHROMAX) 250 MG tablet; Take 2 tablets on day  1, then 1 tablet daily on days 2 through 5  Dispense: 6 tablet; Refill: 0  2. Chronic obstructive pulmonary disease, unspecified COPD type (HCC) - albuterol (VENTOLIN HFA) 108 (90 Base) MCG/ACT inhaler; Inhale 2-4 puffs by mouth every 4 hours as needed for wheezing, cough, and/or shortness of breath  Dispense: 18 g; Refill: 0  INSTRUCTIONS: use a humidifier for nasal congestion Albuterol nebs for productive cough Drink plenty of fluids, rest and wash hands frequently to avoid the spread of infection Alternate tylenol and Motrin for relief of fever   Follow Up Instructions: I discussed the assessment and treatment plan with the patient. The patient was provided an opportunity to ask questions and all were answered. The patient agreed with the plan and demonstrated an understanding of the instructions.  A copy of instructions were sent to the patient via MyChart unless otherwise noted below.    The patient was advised to call back or seek an in-person evaluation if the symptoms worsen or if the condition fails to improve as anticipated.    Claiborne Rigg, NP

## 2023-10-06 NOTE — Patient Instructions (Addendum)
Cathy Hughes, thank you for joining Claiborne Rigg, NP for today's virtual visit.  While this provider is not your primary care provider (PCP), if your PCP is located in our provider database this encounter information will be shared with them immediately following your visit.   A [facility] MyChart account gives you access to today's visit and all your visits, tests, and labs performed at [facility] " click here if you don't have a [facility] MyChart account or go to [email or web address]  Consent: (Patient) Cathy Hughes provided verbal consent for this virtual visit at the beginning of the encounter.  Current Medications:  Current Outpatient Medications:    azithromycin (ZITHROMAX) 250 MG tablet, Take 2 tablets on day 1, then 1 tablet daily on days 2 through 5, Disp: 6 tablet, Rfl: 0   brompheniramine-pseudoephedrine-DM 30-2-10 MG/5ML syrup, Take 5 mLs by mouth 4 (four) times daily as needed., Disp: 240 mL, Rfl: 0   predniSONE (DELTASONE) 20 MG tablet, Take 2 tablets (40 mg total) by mouth daily with breakfast for 5 days., Disp: 10 tablet, Rfl: 0   albuterol (VENTOLIN HFA) 108 (90 Base) MCG/ACT inhaler, Inhale 2-4 puffs by mouth every 4 hours as needed for wheezing, cough, and/or shortness of breath, Disp: 18 g, Rfl: 0   ALPRAZolam (XANAX) 0.5 MG tablet, Take 0.5 mg by mouth 2 (two) times daily as needed., Disp: , Rfl:    amitriptyline (ELAVIL) 10 MG tablet, Take 10 mg by mouth at bedtime., Disp: , Rfl:    amLODipine (NORVASC) 10 MG tablet, Take 0.5 tablets (5 mg total) by mouth daily., Disp: 30 tablet, Rfl: 0   amoxicillin (AMOXIL) 500 MG capsule, Take 2 capsules (1,000 mg total) by mouth 3 (three) times daily., Disp: 42 capsule, Rfl: 0   Ensure Max Protein (ENSURE MAX PROTEIN) LIQD, Take 330 mLs (11 oz total) by mouth 2 (two) times daily between meals., Disp: 660 mL, Rfl: 0   fluticasone (FLONASE) 50 MCG/ACT nasal spray, Place 2 sprays into both nostrils daily.,  Disp: 16 g, Rfl: 0   ipratropium-albuterol (DUONEB) 0.5-2.5 (3) MG/3ML SOLN, Take 3 mLs by nebulization every 6 (six) hours as needed. Dx: J44.1, Disp: 180 mL, Rfl: 0   lidocaine (LIDODERM) 5 %, Place 1 patch onto the skin every 12 (twelve) hours. Remove & Discard patch within 12 hours or as directed by MD, Disp: 60 patch, Rfl: 11   lidocaine (XYLOCAINE) 2 % solution, Use as directed 5 mLs in the mouth or throat every 6 (six) hours as needed for mouth pain. Mix with 5 mL of Bromfed-DM for swish and swallow., Disp: 100 mL, Rfl: 0   meclizine (ANTIVERT) 12.5 MG tablet, Take 1 tablet (12.5 mg total) by mouth 3 (three) times daily as needed for dizziness., Disp: 20 tablet, Rfl: 0   montelukast (SINGULAIR) 10 MG tablet, Take 1 tablet (10 mg total) by mouth daily., Disp: 30 tablet, Rfl: 0   nystatin (MYCOSTATIN) 100000 UNIT/ML suspension, Take 5 mLs (500,000 Units total) by mouth 4 (four) times daily., Disp: 60 mL, Rfl: 0   omeprazole (PRILOSEC) 20 MG capsule, Take 1 capsule (20 mg total) by mouth 2 (two) times daily., Disp: 60 capsule, Rfl: 0   sertraline (ZOLOFT) 50 MG tablet, Take 1 tablet (50 mg total) by mouth daily. (Patient not taking: Reported on 05/23/2021), Disp: 30 tablet, Rfl: 2   sodium chloride flush (NS) 0.9 % SOLN, 10 mLs by Intracatheter route every 12 (twelve) hours., Disp: 15 Syringe,  Rfl: 0   Medications ordered in this encounter:  Meds ordered this encounter  Medications   brompheniramine-pseudoephedrine-DM 30-2-10 MG/5ML syrup    Sig: Take 5 mLs by mouth 4 (four) times daily as needed.    Dispense:  240 mL    Refill:  0    Supervising Provider:   Merrilee Jansky [[id number]]   predniSONE (DELTASONE) 20 MG tablet    Sig: Take 2 tablets (40 mg total) by mouth daily with breakfast for 5 days.    Dispense:  10 tablet    Refill:  0    Supervising Provider:   Merrilee Jansky [[id number]]   azithromycin (ZITHROMAX) 250 MG tablet    Sig: Take 2 tablets on day 1, then 1 tablet daily on  days 2 through 5    Dispense:  6 tablet    Refill:  0    Supervising Provider:   Merrilee Jansky [[id number]]   albuterol (VENTOLIN HFA) 108 (90 Base) MCG/ACT inhaler    Sig: Inhale 2-4 puffs by mouth every 4 hours as needed for wheezing, cough, and/or shortness of breath    Dispense:  18 g    Refill:  0    Supervising Provider:   Merrilee Jansky [[id number]]     *If you need refills on other medications prior to your next appointment, please contact your pharmacy*  Follow-Up: Call back or seek an in-person evaluation if the symptoms worsen or if the condition fails to improve as anticipated.  [facility] [phone]  Other Instructions INSTRUCTIONS: use a humidifier for nasal congestion Albuterol nebs for productive cough Drink plenty of fluids, rest and wash hands frequently to avoid the spread of infection Alternate tylenol and Motrin for relief of fever    If you have been instructed to have an in-person evaluation today at a local Urgent Care facility, please use the link below. It will take you to a list of all of our available [facility], including address, phone number and hours of operation. Please do not delay care.  [facility]  If you or a family member do not have a primary care provider, use the link below to schedule a visit and establish care. When you choose a [facility] primary care physician or advanced practice provider, you gain a long-term partner in health. Find a Primary Care Provider  Learn more about [facility]'s in-office and virtual care options: [facility] - Get Care Now

## 2023-11-01 ENCOUNTER — Telehealth: Payer: Medicare Other | Admitting: Physician Assistant

## 2023-11-01 DIAGNOSIS — J0110 Acute frontal sinusitis, unspecified: Secondary | ICD-10-CM | POA: Diagnosis not present

## 2023-11-01 MED ORDER — BENZONATATE 100 MG PO CAPS: 100.0000 mg | ORAL_CAPSULE | Freq: Three times a day (TID) | ORAL | 0 refills | Status: DC | PRN

## 2023-11-01 MED ORDER — DOXYCYCLINE HYCLATE 100 MG PO TABS: 100.0000 mg | ORAL_TABLET | Freq: Two times a day (BID) | ORAL | 0 refills | Status: DC

## 2023-11-01 NOTE — Progress Notes (Signed)
 Virtual Visit Consent   Cathy Hughes, you are scheduled for a virtual visit with a [facility] provider today. Just as with appointments in the office, your consent must be obtained to participate. Your consent will be active for this visit and any virtual visit you may have with one of our providers in the next 365 days. If you have a MyChart account, a copy of this consent can be sent to you electronically.  As this is a virtual visit, video technology does not allow for your provider to perform a traditional examination. This may limit your provider's ability to fully assess your condition. If your provider identifies any concerns that need to be evaluated in person or the need to arrange testing (such as labs, EKG, etc.), we will make arrangements to do so. Although advances in technology are sophisticated, we cannot ensure that it will always work on either your end or our end. If the connection with a video visit is poor, the visit may have to be switched to a telephone visit. With either a video or telephone visit, we are not always able to ensure that we have a secure connection.  By engaging in this virtual visit, you consent to the provision of healthcare and authorize for your insurance to be billed (if applicable) for the services provided during this visit. Depending on your insurance coverage, you may receive a charge related to this service.  I need to obtain your verbal consent now. Are you willing to proceed with your visit today? Cathy Hughes has provided verbal consent on 11/01/2023 for a virtual visit (video or telephone). Piedad Climes, [place]  Date: 11/01/2023 3:40 PM   Virtual Visit via Video Note   I, Piedad Climes, connected with  Cathy Hughes  ([id number], 31-Oct-1960) on 11/01/23 at  4:15 PM EST by a video-enabled telemedicine application and verified that I am speaking with the correct person using two identifiers.  Location: Patient: Virtual Visit  Location Patient: Home Provider: Virtual Visit Location Provider: Home Office   I discussed the limitations of evaluation and management by telemedicine and the availability of in person appointments. The patient expressed understanding and agreed to proceed.    History of Present Illness: Cathy Hughes is a 63 y.o. who identifies as a female who was assigned female at birth, and is being seen today for worsening nasal congestion, sore throat, sinus pressure and cough. Notes some sweats without true fever. Notes cough is sometimes productive of thick-green sputum, but feels mainly related to coughed-up PND. She is having substantial sinus headache and sinus pain with thick yellow discharge.  Is using some leftover Bromfed-DM cough syrup.   OTC -- Nothing.  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   Perennial allergic rhinitis 10/06/2019   Tobacco abuse 10/06/2019   Sepsis (HCC) 10/05/2019   Acute metabolic encephalopathy 10/05/2019   Hyperglycemia due to type 2 diabetes mellitus (HCC) 10/05/2019   Left ear pain 10/05/2019   COPD (chronic obstructive pulmonary disease) (HCC) 07/31/2018   Class 1 obesity due to excess calories with serious comorbidity and body mass index (BMI) of 34.0 to 34.9 in adult 07/31/2018   HTN (hypertension) 12/09/2013    Allergies:  Allergies  Allergen Reactions   Aspirin Hives   Tramadol Other (See  Comments)    Headache    Medications:  Current Outpatient Medications:    benzonatate (TESSALON) 100 MG capsule, Take 1 capsule (100 mg total) by mouth 3 (three) times daily as needed for cough., Disp: 30 capsule, Rfl: 0   doxycycline (VIBRA-TABS) 100 MG tablet, Take 1 tablet (100 mg total) by mouth 2 (two) times daily., Disp: 14 tablet, Rfl: 0   albuterol (VENTOLIN HFA) 108 (90 Base) MCG/ACT  inhaler, Inhale 2-4 puffs by mouth every 4 hours as needed for wheezing, cough, and/or shortness of breath, Disp: 18 g, Rfl: 0   ALPRAZolam (XANAX) 0.5 MG tablet, Take 0.5 mg by mouth 2 (two) times daily as needed., Disp: , Rfl:    amitriptyline (ELAVIL) 10 MG tablet, Take 10 mg by mouth at bedtime., Disp: , Rfl:    amLODipine (NORVASC) 10 MG tablet, Take 0.5 tablets (5 mg total) by mouth daily., Disp: 30 tablet, Rfl: 0   brompheniramine-pseudoephedrine-DM 30-2-10 MG/5ML syrup, Take 5 mLs by mouth 4 (four) times daily as needed., Disp: 240 mL, Rfl: 0   Ensure Max Protein (ENSURE MAX PROTEIN) LIQD, Take 330 mLs (11 oz total) by mouth 2 (two) times daily between meals., Disp: 660 mL, Rfl: 0   fluticasone (FLONASE) 50 MCG/ACT nasal spray, Place 2 sprays into both nostrils daily., Disp: 16 g, Rfl: 0   ipratropium-albuterol (DUONEB) 0.5-2.5 (3) MG/3ML SOLN, Take 3 mLs by nebulization every 6 (six) hours as needed. Dx: J44.1, Disp: 180 mL, Rfl: 0   lidocaine (XYLOCAINE) 2 % solution, Use as directed 5 mLs in the mouth or throat every 6 (six) hours as needed for mouth pain. Mix with 5 mL of Bromfed-DM for swish and swallow., Disp: 100 mL, Rfl: 0   meclizine (ANTIVERT) 12.5 MG tablet, Take 1 tablet (12.5 mg total) by mouth 3 (three) times daily as needed for dizziness., Disp: 20 tablet, Rfl: 0   montelukast (SINGULAIR) 10 MG tablet, Take 1 tablet (10 mg total) by mouth daily., Disp: 30 tablet, Rfl: 0   nystatin (MYCOSTATIN) 100000 UNIT/ML suspension, Take 5 mLs (500,000 Units total) by mouth 4 (four) times daily., Disp: 60 mL, Rfl: 0   omeprazole (PRILOSEC) 20 MG capsule, Take 1 capsule (20 mg total) by mouth 2 (two) times daily., Disp: 60 capsule, Rfl: 0   sertraline (ZOLOFT) 50 MG tablet, Take 1 tablet (50 mg total) by mouth daily. (Patient not taking: Reported on 05/23/2021), Disp: 30 tablet, Rfl: 2   sodium chloride flush (NS) 0.9 % SOLN, 10 mLs by Intracatheter route every 12 (twelve) hours., Disp: 15  Syringe, Rfl: 0  Observations/Objective: Patient is well-developed, well-nourished in no acute distress.  Resting comfortably at home.  Head is normocephalic, atraumatic.  No labored breathing. Speech is clear and coherent with logical content.  Patient is alert and oriented at baseline.   Assessment and Plan: 1. Acute non-recurrent frontal sinusitis (Primary) - benzonatate (TESSALON) 100 MG capsule; Take 1 capsule (100 mg total) by mouth 3 (three) times daily as needed for cough.  Dispense: 30 capsule; Refill: 0 - doxycycline (VIBRA-TABS) 100 MG tablet; Take 1 tablet (100 mg total) by mouth 2 (two) times daily.  Dispense: 14 tablet; Refill: 0  Rx Doxycycline.  Increase fluids.  Rest.  Saline nasal spray.  Probiotic.  Mucinex as directed.  Humidifier in bedroom. Tessalon per orders.  Call or return to clinic if symptoms are not improving.   Follow Up Instructions: I discussed the assessment and treatment plan with the patient. The  patient was provided an opportunity to ask questions and all were answered. The patient agreed with the plan and demonstrated an understanding of the instructions.  A copy of instructions were sent to the patient via MyChart unless otherwise noted below.   The patient was advised to call back or seek an in-person evaluation if the symptoms worsen or if the condition fails to improve as anticipated.    Piedad Climes, PA-C

## 2023-11-01 NOTE — Patient Instructions (Signed)
 Hilton Sinclair, thank you for joining Piedad Climes, PA-C for today's virtual visit.  While this provider is not your primary care provider (PCP), if your PCP is located in our provider database this encounter information will be shared with them immediately following your visit.   A [facility] MyChart account gives you access to today's visit and all your visits, tests, and labs performed at [facility] " click here if you don't have a [facility] MyChart account or go to [email or web address]  Consent: (Patient) Hilton Sinclair provided verbal consent for this virtual visit at the beginning of the encounter.  Current Medications:  Current Outpatient Medications:    albuterol (VENTOLIN HFA) 108 (90 Base) MCG/ACT inhaler, Inhale 2-4 puffs by mouth every 4 hours as needed for wheezing, cough, and/or shortness of breath, Disp: 18 g, Rfl: 0   ALPRAZolam (XANAX) 0.5 MG tablet, Take 0.5 mg by mouth 2 (two) times daily as needed., Disp: , Rfl:    amitriptyline (ELAVIL) 10 MG tablet, Take 10 mg by mouth at bedtime., Disp: , Rfl:    amLODipine (NORVASC) 10 MG tablet, Take 0.5 tablets (5 mg total) by mouth daily., Disp: 30 tablet, Rfl: 0   amoxicillin (AMOXIL) 500 MG capsule, Take 2 capsules (1,000 mg total) by mouth 3 (three) times daily., Disp: 42 capsule, Rfl: 0   brompheniramine-pseudoephedrine-DM 30-2-10 MG/5ML syrup, Take 5 mLs by mouth 4 (four) times daily as needed., Disp: 240 mL, Rfl: 0   Ensure Max Protein (ENSURE MAX PROTEIN) LIQD, Take 330 mLs (11 oz total) by mouth 2 (two) times daily between meals., Disp: 660 mL, Rfl: 0   fluticasone (FLONASE) 50 MCG/ACT nasal spray, Place 2 sprays into both nostrils daily., Disp: 16 g, Rfl: 0   ipratropium-albuterol (DUONEB) 0.5-2.5 (3) MG/3ML SOLN, Take 3 mLs by nebulization every 6 (six) hours as needed. Dx: J44.1, Disp: 180 mL, Rfl: 0   lidocaine (XYLOCAINE) 2 % solution, Use as directed 5 mLs in the mouth or throat every 6  (six) hours as needed for mouth pain. Mix with 5 mL of Bromfed-DM for swish and swallow., Disp: 100 mL, Rfl: 0   meclizine (ANTIVERT) 12.5 MG tablet, Take 1 tablet (12.5 mg total) by mouth 3 (three) times daily as needed for dizziness., Disp: 20 tablet, Rfl: 0   montelukast (SINGULAIR) 10 MG tablet, Take 1 tablet (10 mg total) by mouth daily., Disp: 30 tablet, Rfl: 0   nystatin (MYCOSTATIN) 100000 UNIT/ML suspension, Take 5 mLs (500,000 Units total) by mouth 4 (four) times daily., Disp: 60 mL, Rfl: 0   omeprazole (PRILOSEC) 20 MG capsule, Take 1 capsule (20 mg total) by mouth 2 (two) times daily., Disp: 60 capsule, Rfl: 0   sertraline (ZOLOFT) 50 MG tablet, Take 1 tablet (50 mg total) by mouth daily. (Patient not taking: Reported on 05/23/2021), Disp: 30 tablet, Rfl: 2   sodium chloride flush (NS) 0.9 % SOLN, 10 mLs by Intracatheter route every 12 (twelve) hours., Disp: 15 Syringe, Rfl: 0   Medications ordered in this encounter:  No orders of the defined types were placed in this encounter.    *If you need refills on other medications prior to your next appointment, please contact your pharmacy*  Follow-Up: Call back or seek an in-person evaluation if the symptoms worsen or if the condition fails to improve as anticipated.  [facility] [phone]  Other Instructions Please take antibiotic as directed.  Increase fluid intake.  Use Saline nasal spray.  Take a daily multivitamin. Use the prescription cough medications as directed. Continue use of albuterol as directed, when needed.  Place a humidifier in the bedroom.  Please call or return clinic if symptoms are not improving.  Sinusitis Sinusitis is redness, soreness, and swelling (inflammation) of the paranasal sinuses. Paranasal sinuses are air pockets within the bones of your face (beneath the eyes, the middle of the forehead, or above the eyes). In healthy paranasal sinuses, mucus is able to drain out, and air is able to  circulate through them by way of your nose. However, when your paranasal sinuses are inflamed, mucus and air can become trapped. This can allow bacteria and other germs to grow and cause infection. Sinusitis can develop quickly and last only a short time (acute) or continue over a long period (chronic). Sinusitis that lasts for more than 12 weeks is considered chronic.  CAUSES  Causes of sinusitis include: Allergies. Structural abnormalities, such as displacement of the cartilage that separates your nostrils (deviated septum), which can decrease the air flow through your nose and sinuses and affect sinus drainage. Functional abnormalities, such as when the small hairs (cilia) that line your sinuses and help remove mucus do not work properly or are not present. SYMPTOMS  Symptoms of acute and chronic sinusitis are the same. The primary symptoms are pain and pressure around the affected sinuses. Other symptoms include: Upper toothache. Earache. Headache. Bad breath. Decreased sense of smell and taste. A cough, which worsens when you are lying flat. Fatigue. Fever. Thick drainage from your nose, which often is green and may contain pus (purulent). Swelling and warmth over the affected sinuses. DIAGNOSIS  Your caregiver will perform a physical exam. During the exam, your caregiver may: Look in your nose for signs of abnormal growths in your nostrils (nasal polyps). Tap over the affected sinus to check for signs of infection. View the inside of your sinuses (endoscopy) with a special imaging device with a light attached (endoscope), which is inserted into your sinuses. If your caregiver suspects that you have chronic sinusitis, one or more of the following tests may be recommended: Allergy tests. Nasal culture A sample of mucus is taken from your nose and sent to a lab and screened for bacteria. Nasal cytology A sample of mucus is taken from your nose and examined by your caregiver to determine  if your sinusitis is related to an allergy. TREATMENT  Most cases of acute sinusitis are related to a viral infection and will resolve on their own within 10 days. Sometimes medicines are prescribed to help relieve symptoms (pain medicine, decongestants, nasal steroid sprays, or saline sprays).  However, for sinusitis related to a bacterial infection, your caregiver will prescribe antibiotic medicines. These are medicines that will help kill the bacteria causing the infection.  Rarely, sinusitis is caused by a fungal infection. In theses cases, your caregiver will prescribe antifungal medicine. For some cases of chronic sinusitis, surgery is needed. Generally, these are cases in which sinusitis recurs more than 3 times per year, despite other treatments. HOME CARE INSTRUCTIONS  Drink plenty of water. Water helps thin the mucus so your sinuses can drain more easily. Use a humidifier. Inhale steam 3 to 4 times a day (for example, sit in the bathroom with the shower running). Apply a warm, moist washcloth to your face 3 to 4 times a day, or as directed by your caregiver. Use saline nasal sprays to help moisten and clean your sinuses. Take over-the-counter or prescription medicines  for pain, discomfort, or fever only as directed by your caregiver. SEEK IMMEDIATE MEDICAL CARE IF: You have increasing pain or severe headaches. You have nausea, vomiting, or drowsiness. You have swelling around your face. You have vision problems. You have a stiff neck. You have difficulty breathing. MAKE SURE YOU:  Understand these instructions. Will watch your condition. Will get help right away if you are not doing well or get worse. Document Released: 08/28/2005 Document Revised: 11/20/2011 Document Reviewed: 09/12/2011 [facility] Patient Information 2014 [place], [place].    If you have been instructed to have an in-person evaluation today at a local Urgent Care facility, please use the link below. It will take  you to a list of all of our available [facility], including address, phone number and hours of operation. Please do not delay care.  [facility]  If you or a family member do not have a primary care provider, use the link below to schedule a visit and establish care. When you choose a [facility] primary care physician or advanced practice provider, you gain a long-term partner in health. Find a Primary Care Provider  Learn more about [facility]'s in-office and virtual care options: [facility] - Get Care Now

## 2024-01-11 ENCOUNTER — Telehealth: Admitting: Family Medicine

## 2024-01-11 DIAGNOSIS — J019 Acute sinusitis, unspecified: Secondary | ICD-10-CM

## 2024-01-11 DIAGNOSIS — J0110 Acute frontal sinusitis, unspecified: Secondary | ICD-10-CM

## 2024-01-11 DIAGNOSIS — B9689 Other specified bacterial agents as the cause of diseases classified elsewhere: Secondary | ICD-10-CM

## 2024-01-11 MED ORDER — DOXYCYCLINE HYCLATE 100 MG PO TABS: 100.0000 mg | ORAL_TABLET | Freq: Two times a day (BID) | ORAL | 0 refills | Status: AC

## 2024-01-11 NOTE — Patient Instructions (Signed)

## 2024-01-11 NOTE — Progress Notes (Signed)
 Virtual Visit Consent   Cathy Hughes, you are scheduled for a virtual visit with a [facility] provider today. Just as with appointments in the office, your consent must be obtained to participate. Your consent will be active for this visit and any virtual visit you may have with one of our providers in the next 365 days. If you have a MyChart account, a copy of this consent can be sent to you electronically.  As this is a virtual visit, video technology does not allow for your provider to perform a traditional examination. This may limit your provider's ability to fully assess your condition. If your provider identifies any concerns that need to be evaluated in person or the need to arrange testing (such as labs, EKG, etc.), we will make arrangements to do so. Although advances in technology are sophisticated, we cannot ensure that it will always work on either your end or our end. If the connection with a video visit is poor, the visit may have to be switched to a telephone visit. With either a video or telephone visit, we are not always able to ensure that we have a secure connection.  By engaging in this virtual visit, you consent to the provision of healthcare and authorize for your insurance to be billed (if applicable) for the services provided during this visit. Depending on your insurance coverage, you may receive a charge related to this service.  I need to obtain your verbal consent now. Are you willing to proceed with your visit today? Cathy Hughes has provided verbal consent on 01/11/2024 for a virtual visit (video or telephone). Albertha Huger, FNP  Date: 01/11/2024 4:24 PM   Virtual Visit via Video Note   I, Albertha Huger, connected with  Cathy Hughes  ([id number], 1961-02-14, 63) on 01/11/24 at  4:15 PM EDT by a video-enabled telemedicine application and verified that I am speaking with the correct person using two identifiers.  Location: Patient: Virtual Visit Location Patient:  Home Provider: Virtual Visit Location Provider: Home Office   I discussed the limitations of evaluation and management by telemedicine and the availability of in person appointments. The patient expressed understanding and agreed to proceed.    History of Present Illness: Cathy Hughes is a 63 y.o. who identifies as a female who was assigned female at birth, and is being seen today for allergies with sinus pain and pressure and slight cough. No fever. Mucus is yellow. Sx for a week. 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   Perennial allergic rhinitis 10/06/2019   Tobacco abuse 10/06/2019   Sepsis (HCC) 10/05/2019   Acute metabolic encephalopathy 10/05/2019   Hyperglycemia due to type 2 diabetes mellitus (HCC) 10/05/2019   Left ear pain 10/05/2019   COPD (chronic obstructive pulmonary disease) (HCC) 07/31/2018   Class 1 obesity due to excess calories with serious comorbidity and body mass index (BMI) of 34.0 to 34.9 in adult 07/31/2018   HTN (hypertension) 12/09/2013    Allergies:  Allergies  Allergen Reactions   Aspirin Hives   Tramadol  Other (See Comments)    Headache    Medications:  Current Outpatient Medications:    albuterol  (VENTOLIN  HFA) 108 (90 Base) MCG/ACT inhaler, Inhale 2-4 puffs by mouth every 4 hours as needed for wheezing, cough, and/or shortness  of breath, Disp: 18 g, Rfl: 0   ALPRAZolam  (XANAX ) 0.5 MG tablet, Take 0.5 mg by mouth 2 (two) times daily as needed., Disp: , Rfl:    amitriptyline (ELAVIL) 10 MG tablet, Take 10 mg by mouth at bedtime., Disp: , Rfl:    amLODipine  (NORVASC ) 10 MG tablet, Take 0.5 tablets (5 mg total) by mouth daily., Disp: 30 tablet, Rfl: 0   benzonatate  (TESSALON ) 100 MG capsule, Take 1 capsule (100 mg total) by mouth 3 (three) times daily as needed for cough.,  Disp: 30 capsule, Rfl: 0   brompheniramine-pseudoephedrine-DM 30-2-10 MG/5ML syrup, Take 5 mLs by mouth 4 (four) times daily as needed., Disp: 240 mL, Rfl: 0   doxycycline  (VIBRA -TABS) 100 MG tablet, Take 1 tablet (100 mg total) by mouth 2 (two) times daily., Disp: 14 tablet, Rfl: 0   Ensure Max Protein (ENSURE MAX PROTEIN) LIQD, Take 330 mLs (11 oz total) by mouth 2 (two) times daily between meals., Disp: 660 mL, Rfl: 0   fluticasone  (FLONASE ) 50 MCG/ACT nasal spray, Place 2 sprays into both nostrils daily., Disp: 16 g, Rfl: 0   ipratropium-albuterol  (DUONEB) 0.5-2.5 (3) MG/3ML SOLN, Take 3 mLs by nebulization every 6 (six) hours as needed. Dx: J44.1, Disp: 180 mL, Rfl: 0   lidocaine  (XYLOCAINE ) 2 % solution, Use as directed 5 mLs in the mouth or throat every 6 (six) hours as needed for mouth pain. Mix with 5 mL of Bromfed-DM for swish and swallow., Disp: 100 mL, Rfl: 0   meclizine  (ANTIVERT ) 12.5 MG tablet, Take 1 tablet (12.5 mg total) by mouth 3 (three) times daily as needed for dizziness., Disp: 20 tablet, Rfl: 0   montelukast  (SINGULAIR ) 10 MG tablet, Take 1 tablet (10 mg total) by mouth daily., Disp: 30 tablet, Rfl: 0   nystatin  (MYCOSTATIN ) 100000 UNIT/ML suspension, Take 5 mLs (500,000 Units total) by mouth 4 (four) times daily., Disp: 60 mL, Rfl: 0   omeprazole  (PRILOSEC) 20 MG capsule, Take 1 capsule (20 mg total) by mouth 2 (two) times daily., Disp: 60 capsule, Rfl: 0   sertraline  (ZOLOFT ) 50 MG tablet, Take 1 tablet (50 mg total) by mouth daily. (Patient not taking: Reported on 05/23/2021), Disp: 30 tablet, Rfl: 2   sodium chloride  flush (NS) 0.9 % SOLN, 10 mLs by Intracatheter route every 12 (twelve) hours., Disp: 15 Syringe, Rfl: 0  Observations/Objective: Patient is well-developed, well-nourished in no acute distress.  Resting comfortably  at home.  Head is normocephalic, atraumatic.  No labored breathing.  Speech is clear and coherent with logical content.  Patient is alert and  oriented at baseline.    Assessment and Plan: 1. Acute bacterial sinusitis (Primary)  2. Acute non-recurrent frontal sinusitis  Increase fluids, humidifier at night, UC if sx worsen.   Follow Up Instructions: I discussed the assessment and treatment plan with the patient. The patient was provided an opportunity to ask questions and all were answered. The patient agreed with the plan and demonstrated an understanding of the instructions.  A copy of instructions were sent to the patient via MyChart unless otherwise noted below.    The patient was advised to call back or seek an in-person evaluation if the symptoms worsen or if the condition fails to improve as anticipated.    Maxx Calaway, FNP

## 2024-01-31 ENCOUNTER — Telehealth: Admitting: Nurse Practitioner

## 2024-01-31 DIAGNOSIS — J069 Acute upper respiratory infection, unspecified: Secondary | ICD-10-CM | POA: Diagnosis not present

## 2024-01-31 MED ORDER — PREDNISONE 20 MG PO TABS: 40.0000 mg | ORAL_TABLET | Freq: Every day | ORAL | 0 refills | Status: AC

## 2024-01-31 NOTE — Patient Instructions (Signed)
 Cathy Hughes, thank you for joining Collins Dean, NP for today's virtual visit.  While this provider is not your primary care provider (PCP), if your PCP is located in our provider database this encounter information will be shared with them immediately following your visit.   A [facility] MyChart account gives you access to today's visit and all your visits, tests, and labs performed at [facility] " click here if you don't have a [facility] MyChart account or go to [email or web address]  Consent: (Patient) Cathy Hughes provided verbal consent for this virtual visit at the beginning of the encounter.  Current Medications:  Current Outpatient Medications:    predniSONE  (DELTASONE ) 20 MG tablet, Take 2 tablets (40 mg total) by mouth daily with breakfast for 5 days., Disp: 10 tablet, Rfl: 0   albuterol  (VENTOLIN  HFA) 108 (90 Base) MCG/ACT inhaler, Inhale 2-4 puffs by mouth every 4 hours as needed for wheezing, cough, and/or shortness of breath, Disp: 18 g, Rfl: 0   ALPRAZolam  (XANAX ) 0.5 MG tablet, Take 0.5 mg by mouth 2 (two) times daily as needed., Disp: , Rfl:    amitriptyline (ELAVIL) 10 MG tablet, Take 10 mg by mouth at bedtime., Disp: , Rfl:    amLODipine  (NORVASC ) 10 MG tablet, Take 0.5 tablets (5 mg total) by mouth daily., Disp: 30 tablet, Rfl: 0   benzonatate  (TESSALON ) 100 MG capsule, Take 1 capsule (100 mg total) by mouth 3 (three) times daily as needed for cough., Disp: 30 capsule, Rfl: 0   brompheniramine-pseudoephedrine-DM 30-2-10 MG/5ML syrup, Take 5 mLs by mouth 4 (four) times daily as needed., Disp: 240 mL, Rfl: 0   Ensure Max Protein (ENSURE MAX PROTEIN) LIQD, Take 330 mLs (11 oz total) by mouth 2 (two) times daily between meals., Disp: 660 mL, Rfl: 0   fluticasone  (FLONASE ) 50 MCG/ACT nasal spray, Place 2 sprays into both nostrils daily., Disp: 16 g, Rfl: 0   ipratropium-albuterol  (DUONEB) 0.5-2.5 (3) MG/3ML SOLN, Take 3 mLs by nebulization every 6  (six) hours as needed. Dx: J44.1, Disp: 180 mL, Rfl: 0   lidocaine  (XYLOCAINE ) 2 % solution, Use as directed 5 mLs in the mouth or throat every 6 (six) hours as needed for mouth pain. Mix with 5 mL of Bromfed-DM for swish and swallow., Disp: 100 mL, Rfl: 0   meclizine  (ANTIVERT ) 12.5 MG tablet, Take 1 tablet (12.5 mg total) by mouth 3 (three) times daily as needed for dizziness., Disp: 20 tablet, Rfl: 0   montelukast  (SINGULAIR ) 10 MG tablet, Take 1 tablet (10 mg total) by mouth daily., Disp: 30 tablet, Rfl: 0   nystatin  (MYCOSTATIN ) 100000 UNIT/ML suspension, Take 5 mLs (500,000 Units total) by mouth 4 (four) times daily., Disp: 60 mL, Rfl: 0   omeprazole  (PRILOSEC) 20 MG capsule, Take 1 capsule (20 mg total) by mouth 2 (two) times daily., Disp: 60 capsule, Rfl: 0   sertraline  (ZOLOFT ) 50 MG tablet, Take 1 tablet (50 mg total) by mouth daily. (Patient not taking: Reported on 05/23/2021), Disp: 30 tablet, Rfl: 2   sodium chloride  flush (NS) 0.9 % SOLN, 10 mLs by Intracatheter route every 12 (twelve) hours., Disp: 15 Syringe, Rfl: 0   Medications ordered in this encounter:  Meds ordered this encounter  Medications   predniSONE  (DELTASONE ) 20 MG tablet    Sig: Take 2 tablets (40 mg total) by mouth daily with breakfast for 5 days.    Dispense:  10 tablet    Refill:  0  Supervising Provider:   Corine Dice [[id number]]     *If you need refills on other medications prior to your next appointment, please contact your pharmacy*  Follow-Up: Call back or seek an in-person evaluation if the symptoms worsen or if the condition fails to improve as anticipated.  [facility] [phone]  Other Instructions She needs to establish care with a PCP for follow-up to frequent URI symptoms   If you have been instructed to have an in-person evaluation today at a local Urgent Care facility, please use the link below. It will take you to a list of all of our available [facility], including address, phone number and hours of operation. Please do not delay care.  [facility]  If you or a family member do not have a primary care provider, use the link below to schedule a visit and establish care. When you choose a [facility] primary care physician or advanced practice provider, you gain a long-term partner in health. Find a Primary Care Provider  Learn more about [facility]'s in-office and virtual care options:  - Get Care Now

## 2024-01-31 NOTE — Progress Notes (Signed)
 Virtual Visit Consent   Cathy Hughes, you are scheduled for a virtual visit with a [facility] provider today. Just as with appointments in the office, your consent must be obtained to participate. Your consent will be active for this visit and any virtual visit you may have with one of our providers in the next 365 days. If you have a MyChart account, a copy of this consent can be sent to you electronically.  As this is a virtual visit, video technology does not allow for your provider to perform a traditional examination. This may limit your provider's ability to fully assess your condition. If your provider identifies any concerns that need to be evaluated in person or the need to arrange testing (such as labs, EKG, etc.), we will make arrangements to do so. Although advances in technology are sophisticated, we cannot ensure that it will always work on either your end or our end. If the connection with a video visit is poor, the visit may have to be switched to a telephone visit. With either a video or telephone visit, we are not always able to ensure that we have a secure connection.  By engaging in this virtual visit, you consent to the provision of healthcare and authorize for your insurance to be billed (if applicable) for the services provided during this visit. Depending on your insurance coverage, you may receive a charge related to this service.  I need to obtain your verbal consent now. Are you willing to proceed with your visit today? Cathy Hughes has provided verbal consent on 01/31/2024 for a virtual visit (video or telephone). Collins Dean, NP  Date: 01/31/2024 3:19 PM   Virtual Visit via Video Note   I, Collins Dean, connected with  Cathy Hughes  ([id number], Aug 05, 1961) on 01/31/24 at  3:00 PM EDT by a video-enabled telemedicine application and verified that I am speaking with the correct person using two identifiers.  Location: Patient: Virtual Visit Location  Patient: Home Provider: Virtual Visit Location Provider: Home Office   I discussed the limitations of evaluation and management by telemedicine and the availability of in person appointments. The patient expressed understanding and agreed to proceed.    History of Present Illness: Cathy Hughes is a 63 y.o. who identifies as a female who was assigned female at birth, and is being seen today for viral uri with cough and congestion   [name] was recently prescribed a 10-day course of doxycycline  for presumed bacterial sinusitis.  She was seen for video visit at that time.  Today she states despite completing her course of doxycycline  and using an inhaler her symptoms of chest congestion, head congestion and sore throat have persisted. She does have a history of COPD and states she stopped smoking several years ago.   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   Perennial allergic rhinitis 10/06/2019   Tobacco abuse 10/06/2019   Sepsis (HCC) 10/05/2019   Acute metabolic encephalopathy 10/05/2019   Hyperglycemia due to type 2 diabetes mellitus (HCC) 10/05/2019   Left ear pain 10/05/2019   COPD (chronic obstructive pulmonary disease) (HCC) 07/31/2018   Class 1 obesity due to excess calories with serious comorbidity and body mass index (BMI) of 34.0 to 34.9 in adult 07/31/2018   HTN (hypertension) 12/09/2013  Allergies:  Allergies  Allergen Reactions   Aspirin Hives   Tramadol  Other (See Comments)    Headache    Medications:  Current Outpatient Medications:    predniSONE  (DELTASONE ) 20 MG tablet, Take 2 tablets (40 mg total) by mouth daily with breakfast for 5 days., Disp: 10 tablet, Rfl: 0   albuterol  (VENTOLIN  HFA) 108 (90 Base) MCG/ACT inhaler, Inhale 2-4 puffs by mouth every 4 hours as needed for wheezing, cough, and/or  shortness of breath, Disp: 18 g, Rfl: 0   ALPRAZolam  (XANAX ) 0.5 MG tablet, Take 0.5 mg by mouth 2 (two) times daily as needed., Disp: , Rfl:    amitriptyline (ELAVIL) 10 MG tablet, Take 10 mg by mouth at bedtime., Disp: , Rfl:    amLODipine  (NORVASC ) 10 MG tablet, Take 0.5 tablets (5 mg total) by mouth daily., Disp: 30 tablet, Rfl: 0   benzonatate  (TESSALON ) 100 MG capsule, Take 1 capsule (100 mg total) by mouth 3 (three) times daily as needed for cough., Disp: 30 capsule, Rfl: 0   brompheniramine-pseudoephedrine-DM 30-2-10 MG/5ML syrup, Take 5 mLs by mouth 4 (four) times daily as needed., Disp: 240 mL, Rfl: 0   Ensure Max Protein (ENSURE MAX PROTEIN) LIQD, Take 330 mLs (11 oz total) by mouth 2 (two) times daily between meals., Disp: 660 mL, Rfl: 0   fluticasone  (FLONASE ) 50 MCG/ACT nasal spray, Place 2 sprays into both nostrils daily., Disp: 16 g, Rfl: 0   ipratropium-albuterol  (DUONEB) 0.5-2.5 (3) MG/3ML SOLN, Take 3 mLs by nebulization every 6 (six) hours as needed. Dx: J44.1, Disp: 180 mL, Rfl: 0   lidocaine  (XYLOCAINE ) 2 % solution, Use as directed 5 mLs in the mouth or throat every 6 (six) hours as needed for mouth pain. Mix with 5 mL of Bromfed-DM for swish and swallow., Disp: 100 mL, Rfl: 0   meclizine  (ANTIVERT ) 12.5 MG tablet, Take 1 tablet (12.5 mg total) by mouth 3 (three) times daily as needed for dizziness., Disp: 20 tablet, Rfl: 0   montelukast  (SINGULAIR ) 10 MG tablet, Take 1 tablet (10 mg total) by mouth daily., Disp: 30 tablet, Rfl: 0   nystatin  (MYCOSTATIN ) 100000 UNIT/ML suspension, Take 5 mLs (500,000 Units total) by mouth 4 (four) times daily., Disp: 60 mL, Rfl: 0   omeprazole  (PRILOSEC) 20 MG capsule, Take 1 capsule (20 mg total) by mouth 2 (two) times daily., Disp: 60 capsule, Rfl: 0   sertraline  (ZOLOFT ) 50 MG tablet, Take 1 tablet (50 mg total) by mouth daily. (Patient not taking: Reported on 05/23/2021), Disp: 30 tablet, Rfl: 2   sodium chloride  flush (NS) 0.9 % SOLN, 10 mLs  by Intracatheter route every 12 (twelve) hours., Disp: 15 Syringe, Rfl: 0  Observations/Objective: Patient is well-developed, well-nourished in no acute distress.  Resting comfortably at home.  Head is normocephalic, atraumatic.  No labored breathing.  Speech is clear and coherent with logical content.  Patient is alert and oriented at baseline.    Assessment and Plan: 1. Viral URI with cough (Primary) - predniSONE  (DELTASONE ) 20 MG tablet; Take 2 tablets (40 mg total) by mouth daily with breakfast for 5 days.  Dispense: 10 tablet; Refill: 0  She needs to establish care with a PCP for follow-up to frequent URI symptoms  Follow Up Instructions: I discussed the assessment and treatment plan with the patient. The patient was provided an opportunity to ask questions and all were answered. The patient agreed with the plan and demonstrated an understanding of the instructions.  A copy of  instructions were sent to the patient via MyChart unless otherwise noted below.    The patient was advised to call back or seek an in-person evaluation if the symptoms worsen or if the condition fails to improve as anticipated.    Moo Gravley W Larri Yehle, NP

## 2024-06-04 ENCOUNTER — Telehealth: Admitting: Physician Assistant

## 2024-06-04 DIAGNOSIS — J069 Acute upper respiratory infection, unspecified: Secondary | ICD-10-CM

## 2024-06-04 MED ORDER — PREDNISONE 20 MG PO TABS: 40.0000 mg | ORAL_TABLET | Freq: Every day | ORAL | 0 refills | Status: DC

## 2024-06-04 MED ORDER — PSEUDOEPH-BROMPHEN-DM 30-2-10 MG/5ML PO SYRP: 5.0000 mL | ORAL_SOLUTION | Freq: Four times a day (QID) | ORAL | 0 refills | Status: DC | PRN

## 2024-06-04 MED ORDER — IPRATROPIUM BROMIDE 0.03 % NA SOLN: 2.0000 | Freq: Two times a day (BID) | NASAL | 0 refills | Status: AC

## 2024-06-04 NOTE — Patient Instructions (Signed)
 Cathy Hughes, thank you for joining Delon CHRISTELLA Dickinson, PA-C for today's virtual visit.  While this provider is not your primary care provider (PCP), if your PCP is located in our provider database this encounter information will be shared with them immediately following your visit.   A [place] MyChart account gives you access to today's visit and all your visits, tests, and labs performed at [facility]  click here if you don't have a  MyChart account or go to [email or web address]  Consent: (Patient) Cathy Hughes provided verbal consent for this virtual visit at the beginning of the encounter.  Current Medications:  Current Outpatient Medications:    brompheniramine-pseudoephedrine-DM 30-2-10 MG/5ML syrup, Take 5 mLs by mouth 4 (four) times daily as needed., Disp: 120 mL, Rfl: 0   ipratropium (ATROVENT ) 0.03 % nasal spray, Place 2 sprays into both nostrils every 12 (twelve) hours., Disp: 30 mL, Rfl: 0   predniSONE  (DELTASONE ) 20 MG tablet, Take 2 tablets (40 mg total) by mouth daily with breakfast., Disp: 10 tablet, Rfl: 0   albuterol  (VENTOLIN  HFA) 108 (90 Base) MCG/ACT inhaler, Inhale 2-4 puffs by mouth every 4 hours as needed for wheezing, cough, and/or shortness of breath, Disp: 18 g, Rfl: 0   ALPRAZolam  (XANAX ) 0.5 MG tablet, Take 0.5 mg by mouth 2 (two) times daily as needed., Disp: , Rfl:    amitriptyline (ELAVIL) 10 MG tablet, Take 10 mg by mouth at bedtime., Disp: , Rfl:    amLODipine  (NORVASC ) 10 MG tablet, Take 0.5 tablets (5 mg total) by mouth daily., Disp: 30 tablet, Rfl: 0   Ensure Max Protein (ENSURE MAX PROTEIN) LIQD, Take 330 mLs (11 oz total) by mouth 2 (two) times daily between meals., Disp: 660 mL, Rfl: 0   fluticasone  (FLONASE ) 50 MCG/ACT nasal spray, Place 2 sprays into both nostrils daily., Disp: 16 g, Rfl: 0   ipratropium-albuterol  (DUONEB) 0.5-2.5 (3) MG/3ML SOLN, Take 3 mLs by nebulization every 6 (six) hours as needed. Dx:  J44.1, Disp: 180 mL, Rfl: 0   lidocaine  (XYLOCAINE ) 2 % solution, Use as directed 5 mLs in the mouth or throat every 6 (six) hours as needed for mouth pain. Mix with 5 mL of Bromfed-DM for swish and swallow., Disp: 100 mL, Rfl: 0   meclizine  (ANTIVERT ) 12.5 MG tablet, Take 1 tablet (12.5 mg total) by mouth 3 (three) times daily as needed for dizziness., Disp: 20 tablet, Rfl: 0   montelukast  (SINGULAIR ) 10 MG tablet, Take 1 tablet (10 mg total) by mouth daily., Disp: 30 tablet, Rfl: 0   nystatin  (MYCOSTATIN ) 100000 UNIT/ML suspension, Take 5 mLs (500,000 Units total) by mouth 4 (four) times daily., Disp: 60 mL, Rfl: 0   omeprazole  (PRILOSEC) 20 MG capsule, Take 1 capsule (20 mg total) by mouth 2 (two) times daily., Disp: 60 capsule, Rfl: 0   sertraline  (ZOLOFT ) 50 MG tablet, Take 1 tablet (50 mg total) by mouth daily. (Patient not taking: Reported on 05/23/2021), Disp: 30 tablet, Rfl: 2   sodium chloride  flush (NS) 0.9 % SOLN, 10 mLs by Intracatheter route every 12 (twelve) hours., Disp: 15 Syringe, Rfl: 0   Medications ordered in this encounter:  Meds ordered this encounter  Medications   ipratropium (ATROVENT ) 0.03 % nasal spray    Sig: Place 2 sprays into both nostrils every 12 (twelve) hours.    Dispense:  30 mL    Refill:  0    Supervising Provider:   BLAISE ALEENE KIDD [phone]  predniSONE  (DELTASONE ) 20 MG tablet    Sig: Take 2 tablets (40 mg total) by mouth daily with breakfast.    Dispense:  10 tablet    Refill:  0    Supervising Provider:   LAMPTEY, PHILIP O [[id number]]   brompheniramine-pseudoephedrine-DM 30-2-10 MG/5ML syrup    Sig: Take 5 mLs by mouth 4 (four) times daily as needed.    Dispense:  120 mL    Refill:  0    Supervising Provider:   BLAISE ALEENE KIDD [[id number]]     *If you need refills on other medications prior to your next appointment, please contact your pharmacy*  Follow-Up: Call back or seek an in-person evaluation if the symptoms worsen or if the condition  fails to improve as anticipated.  [facility] [phone]  Other Instructions Upper Respiratory Infection, Adult An upper respiratory infection (URI) is a common viral infection of the nose, throat, and upper air passages that lead to the lungs. The most common type of URI is the common cold. URIs usually get better on their own, without medical treatment. What are the causes? A URI is caused by a virus. You may catch a virus by: Breathing in droplets from an infected person's cough or sneeze. Touching something that has been exposed to the virus (is contaminated) and then touching your mouth, nose, or eyes. What increases the risk? You are more likely to get a URI if: You are very young or very old. You have close contact with others, such as at work, school, or a health care facility. You smoke. You have long-term (chronic) heart or lung disease. You have a weakened disease-fighting system (immune system). You have nasal allergies or asthma. You are experiencing a lot of stress. You have poor nutrition. What are the signs or symptoms? A URI usually involves some of the following symptoms: Runny or stuffy (congested) nose. Cough. Sneezing. Sore throat. Headache. Fatigue. Fever. Loss of appetite. Pain in your forehead, behind your eyes, and over your cheekbones (sinus pain). Muscle aches. Redness or irritation of the eyes. Pressure in the ears or face. How is this diagnosed? This condition may be diagnosed based on your medical history and symptoms, and a physical exam. Your health care provider may use a swab to take a mucus sample from your nose (nasal swab). This sample can be tested to determine what virus is causing the illness. How is this treated? URIs usually get better on their own within 7-10 days. Medicines cannot cure URIs, but your health care provider may recommend certain medicines to help relieve symptoms, such as: Over-the-counter cold  medicines. Cough suppressants. Coughing is a type of defense against infection that helps to clear the respiratory system, so take these medicines only as recommended by your health care provider. Fever-reducing medicines. Follow these instructions at home: Activity Rest as needed. If you have a fever, stay home from work or school until your fever is gone or until your health care provider says your URI cannot spread to other people (is no longer contagious). Your health care provider may have you wear a face mask to prevent your infection from spreading. Relieving symptoms Gargle with a mixture of salt and water  3-4 times a day or as needed. To make salt water , completely dissolve -1 tsp (3-6 g) of salt in 1 cup (237 mL) of warm water . Use a cool-mist humidifier to add moisture to the air. This can help you breathe more easily. Eating and drinking  Drink enough fluid to keep your urine pale yellow. Eat soups and other clear broths. General instructions  Take over-the-counter and prescription medicines only as told by your health care provider. These include cold medicines, fever reducers, and cough suppressants. Do not use any products that contain nicotine or tobacco. These products include cigarettes, chewing tobacco, and vaping devices, such as e-cigarettes. If you need help quitting, ask your health care provider. Stay away from secondhand smoke. Stay up to date on all immunizations, including the yearly (annual) flu vaccine. Keep all follow-up visits. This is important. How to prevent the spread of infection to others URIs can be contagious. To prevent the infection from spreading: Wash your hands with soap and water  for at least 20 seconds. If soap and water  are not available, use hand sanitizer. Avoid touching your mouth, face, eyes, or nose. Cough or sneeze into a tissue or your sleeve or elbow instead of into your hand or into the air.  Contact a health care provider if: You  are getting worse instead of better. You have a fever or chills. Your mucus is [place] or red. You have yellow or [place] discharge coming from your nose. You have pain in your face, especially when you bend forward. You have swollen neck glands. You have pain while swallowing. You have white areas in the back of your throat. Get help right away if: You have shortness of breath that gets worse. You have severe or persistent: Headache. Ear pain. Sinus pain. Chest pain. You have chronic lung disease along with any of the following: Making high-pitched whistling sounds when you breathe, most often when you breathe out (wheezing). Prolonged cough (more than 14 days). Coughing up blood. A change in your usual mucus. You have a stiff neck. You have changes in your: Vision. Hearing. Thinking. Mood. These symptoms may be an emergency. Get help right away. Call 911. Do not wait to see if the symptoms will go away. Do not drive yourself to the hospital. Summary An upper respiratory infection (URI) is a common infection of the nose, throat, and upper air passages that lead to the lungs. A URI is caused by a virus. URIs usually get better on their own within 7-10 days. Medicines cannot cure URIs, but your health care provider may recommend certain medicines to help relieve symptoms. This information is not intended to replace advice given to you by your health care provider. Make sure you discuss any questions you have with your health care provider. Document Revised: 03/30/2021 Document Reviewed: 03/30/2021 Elsevier Patient Education  2024 Elsevier Inc.   If you have been instructed to have an in-person evaluation today at a local Urgent Care facility, please use the link below. It will take you to a list of all of our available [facility], including address, phone number and hours of operation. Please do not delay care.  [facility]  If you or a family member  do not have a primary care provider, use the link below to schedule a visit and establish care. When you choose a [facility] primary care physician or advanced practice provider, you gain a long-term partner in health. Find a Primary Care Provider  Learn more about [facility] in-office and virtual care options:  - Get Care Now

## 2024-06-04 NOTE — Progress Notes (Signed)
 Virtual Visit Consent   Cathy Hughes, you are scheduled for a virtual visit with a [facility] provider today. Just as with appointments in the office, your consent must be obtained to participate. Your consent will be active for this visit and any virtual visit you may have with one of our providers in the next 365 days. If you have a MyChart account, a copy of this consent can be sent to you electronically.  As this is a virtual visit, video technology does not allow for your provider to perform a traditional examination. This may limit your provider's ability to fully assess your condition. If your provider identifies any concerns that need to be evaluated in person or the need to arrange testing (such as labs, EKG, etc.), we will make arrangements to do so. Although advances in technology are sophisticated, we cannot ensure that it will always work on either your end or our end. If the connection with a video visit is poor, the visit may have to be switched to a telephone visit. With either a video or telephone visit, we are not always able to ensure that we have a secure connection.  By engaging in this virtual visit, you consent to the provision of healthcare and authorize for your insurance to be billed (if applicable) for the services provided during this visit. Depending on your insurance coverage, you may receive a charge related to this service.  I need to obtain your verbal consent now. Are you willing to proceed with your visit today? Cathy Hughes has provided verbal consent on 06/04/2024 for a virtual visit (video or telephone). Delon CHRISTELLA Dickinson, PA-C  Date: 06/04/2024 8:27 AM   Virtual Visit via Video Note   I, Delon CHRISTELLA Dickinson, connected with  Cathy Hughes  ([id number], [birth year]) on 06/04/24 at  8:15 AM EDT by a video-enabled telemedicine application and verified that I am speaking with the correct person using two identifiers.  Location: Patient: Virtual Visit  Location Patient: Home Provider: Virtual Visit Location Provider: Home Office   I discussed the limitations of evaluation and management by telemedicine and the availability of in person appointments. The patient expressed understanding and agreed to proceed.    History of Present Illness: Cathy Hughes is a 63 y.o. who identifies as a female who was assigned female at birth, and is being seen today for head congestion.  HPI: URI  This is a new problem. The current episode started in the past 7 days (Sunday, 06/01/24). The problem has been unchanged. There has been no fever. Associated symptoms include congestion, coughing (from drainage), ear pain (bilateral), headaches, a plugged ear sensation, rhinorrhea (and post nasal drainage), sinus pain and a sore throat. Pertinent negatives include no diarrhea, nausea, vomiting or wheezing.     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   Perennial allergic rhinitis 10/06/2019   Tobacco abuse 10/06/2019   Sepsis (HCC) 10/05/2019   Acute metabolic encephalopathy 10/05/2019   Hyperglycemia due to type 2 diabetes mellitus (HCC) 10/05/2019   Left ear pain 10/05/2019   COPD (chronic obstructive pulmonary disease) (HCC) 07/31/2018   Class 1 obesity due to excess calories with serious comorbidity and body mass index (BMI) of 34.0 to 34.9 in adult 07/31/2018   HTN (hypertension) 12/09/2013    Allergies:  Allergies  Allergen Reactions   Aspirin Hives   Tramadol  Other (See Comments)    Headache    Medications:  Current Outpatient Medications:    brompheniramine-pseudoephedrine-DM 30-2-10 MG/5ML syrup, Take 5 mLs by mouth 4 (four) times daily as needed., Disp: 120 mL, Rfl: 0   ipratropium (ATROVENT ) 0.03 % nasal spray, Place 2 sprays into both nostrils every 12 (twelve) hours., Disp: 30 mL,  Rfl: 0   predniSONE  (DELTASONE ) 20 MG tablet, Take 2 tablets (40 mg total) by mouth daily with breakfast., Disp: 10 tablet, Rfl: 0   albuterol  (VENTOLIN  HFA) 108 (90 Base) MCG/ACT inhaler, Inhale 2-4 puffs by mouth every 4 hours as needed for wheezing, cough, and/or shortness of breath, Disp: 18 g, Rfl: 0   ALPRAZolam  (XANAX ) 0.5 MG tablet, Take 0.5 mg by mouth 2 (two) times daily as needed., Disp: , Rfl:    amitriptyline (ELAVIL) 10 MG tablet, Take 10 mg by mouth at bedtime., Disp: , Rfl:    amLODipine  (NORVASC ) 10 MG tablet, Take 0.5 tablets (5 mg total) by mouth daily., Disp: 30 tablet, Rfl: 0   Ensure Max Protein (ENSURE MAX PROTEIN) LIQD, Take 330 mLs (11 oz total) by mouth 2 (two) times daily between meals., Disp: 660 mL, Rfl: 0   fluticasone  (FLONASE ) 50 MCG/ACT nasal spray, Place 2 sprays into both nostrils daily., Disp: 16 g, Rfl: 0   ipratropium-albuterol  (DUONEB) 0.5-2.5 (3) MG/3ML SOLN, Take 3 mLs by nebulization every 6 (six) hours as needed. Dx: J44.1, Disp: 180 mL, Rfl: 0   lidocaine  (XYLOCAINE ) 2 % solution, Use as directed 5 mLs in the mouth or throat every 6 (six) hours as needed for mouth pain. Mix with 5 mL of Bromfed-DM for swish and swallow., Disp: 100 mL, Rfl: 0   meclizine  (ANTIVERT ) 12.5 MG tablet, Take 1 tablet (12.5 mg total) by mouth 3 (three) times daily as needed for dizziness., Disp: 20 tablet, Rfl: 0   montelukast  (SINGULAIR ) 10 MG tablet, Take 1 tablet (10 mg total) by mouth daily., Disp: 30 tablet, Rfl: 0   nystatin  (MYCOSTATIN ) 100000 UNIT/ML suspension, Take 5 mLs (500,000 Units total) by mouth 4 (four) times daily., Disp: 60 mL, Rfl: 0   omeprazole  (PRILOSEC) 20 MG capsule, Take 1 capsule (20 mg total) by mouth 2 (two) times daily., Disp: 60 capsule, Rfl: 0   sertraline  (ZOLOFT ) 50 MG tablet, Take 1 tablet (50 mg total) by mouth daily. (Patient not taking: Reported on 05/23/2021), Disp: 30 tablet, Rfl: 2   sodium chloride  flush (NS) 0.9 % SOLN, 10 mLs by  Intracatheter route every 12 (twelve) hours., Disp: 15 Syringe, Rfl: 0  Observations/Objective: Patient is well-developed, well-nourished in no acute distress.  Resting comfortably at home.  Head is normocephalic, atraumatic.  No labored breathing.  Speech is clear and coherent with logical content.  Patient is alert and oriented at baseline.    Assessment and Plan: 1. Viral URI with cough (Primary) - ipratropium (ATROVENT ) 0.03 % nasal spray; Place 2 sprays into both nostrils every 12 (twelve) hours.  Dispense: 30 mL; Refill: 0 - predniSONE  (DELTASONE ) 20 MG tablet; Take 2 tablets (40 mg total) by mouth daily with breakfast.  Dispense: 10 tablet; Refill: 0 - brompheniramine-pseudoephedrine-DM 30-2-10 MG/5ML syrup; Take 5 mLs by mouth 4 (four) times daily as needed.  Dispense: 120 mL; Refill: 0  - Suspect viral URI - Symptomatic medications of choice over the counter as needed - Added Ipratropium bromide , Prednisone , and Bromfed DM - Push fluids - Rest -  Seek further evaluation if symptoms change or worsen  Follow Up Instructions: I discussed the assessment and treatment plan with the patient. The patient was provided an opportunity to ask questions and all were answered. The patient agreed with the plan and demonstrated an understanding of the instructions.  A copy of instructions were sent to the patient via MyChart unless otherwise noted below.   Patient has requested to receive PHI (AVS, Work Notes, etc) pertaining to this video visit through e-mail as they are currently without active MyChart. They have voiced understand that email is not considered secure and their health information could be viewed by someone other than the patient.   The patient was advised to call back or seek an in-person evaluation if the symptoms worsen or if the condition fails to improve as anticipated.    Delon CHRISTELLA Dickinson, PA-C

## 2024-06-30 ENCOUNTER — Telehealth: Admitting: Physician Assistant

## 2024-06-30 ENCOUNTER — Telehealth: Admitting: Family Medicine

## 2024-06-30 DIAGNOSIS — J069 Acute upper respiratory infection, unspecified: Secondary | ICD-10-CM

## 2024-06-30 MED ORDER — GUAIFENESIN 200 MG PO TABS: 200.0000 mg | ORAL_TABLET | Freq: Four times a day (QID) | ORAL | 0 refills | Status: AC | PRN

## 2024-06-30 MED ORDER — FLUTICASONE PROPIONATE 50 MCG/ACT NA SUSP
2.0000 | Freq: Every day | NASAL | 0 refills | Status: AC
Start: 2024-06-30 — End: ?

## 2024-06-30 MED ORDER — PSEUDOEPH-BROMPHEN-DM 30-2-10 MG/5ML PO SYRP: 5.0000 mL | ORAL_SOLUTION | Freq: Four times a day (QID) | ORAL | 0 refills | Status: AC | PRN

## 2024-06-30 NOTE — Patient Instructions (Signed)
 Cathy Hughes, thank you for joining [place], PA-C for today's virtual visit.  While this provider is not your primary care provider (PCP), if your PCP is located in our provider database this encounter information will be shared with them immediately following your visit.   A [facility] MyChart account gives you access to today's visit and all your visits, tests, and labs performed at [facility]  click here if you don't have a [facility] MyChart account or go to [email or web address]  Consent: (Patient) Cathy Hughes provided verbal consent for this virtual visit at the beginning of the encounter.  Current Medications:  Current Outpatient Medications:    albuterol  (VENTOLIN  HFA) 108 (90 Base) MCG/ACT inhaler, Inhale 2-4 puffs by mouth every 4 hours as needed for wheezing, cough, and/or shortness of breath, Disp: 18 g, Rfl: 0   ALPRAZolam  (XANAX ) 0.5 MG tablet, Take 0.5 mg by mouth 2 (two) times daily as needed., Disp: , Rfl:    amitriptyline (ELAVIL) 10 MG tablet, Take 10 mg by mouth at bedtime., Disp: , Rfl:    amLODipine  (NORVASC ) 10 MG tablet, Take 0.5 tablets (5 mg total) by mouth daily., Disp: 30 tablet, Rfl: 0   brompheniramine-pseudoephedrine-DM 30-2-10 MG/5ML syrup, Take 5 mLs by mouth 4 (four) times daily as needed for up to 5 days., Disp: 100 mL, Rfl: 0   Ensure Max Protein (ENSURE MAX PROTEIN) LIQD, Take 330 mLs (11 oz total) by mouth 2 (two) times daily between meals., Disp: 660 mL, Rfl: 0   fluticasone  (FLONASE ) 50 MCG/ACT nasal spray, Place 2 sprays into both nostrils daily., Disp: 16 g, Rfl: 0   ipratropium (ATROVENT ) 0.03 % nasal spray, Place 2 sprays into both nostrils every 12 (twelve) hours., Disp: 30 mL, Rfl: 0   ipratropium-albuterol  (DUONEB) 0.5-2.5 (3) MG/3ML SOLN, Take 3 mLs by nebulization every 6 (six) hours as needed. Dx: J44.1, Disp: 180 mL, Rfl: 0   lidocaine  (XYLOCAINE ) 2 % solution, Use as directed 5 mLs in the mouth or throat every  6 (six) hours as needed for mouth pain. Mix with 5 mL of Bromfed-DM for swish and swallow., Disp: 100 mL, Rfl: 0   meclizine  (ANTIVERT ) 12.5 MG tablet, Take 1 tablet (12.5 mg total) by mouth 3 (three) times daily as needed for dizziness., Disp: 20 tablet, Rfl: 0   montelukast  (SINGULAIR ) 10 MG tablet, Take 1 tablet (10 mg total) by mouth daily., Disp: 30 tablet, Rfl: 0   nystatin  (MYCOSTATIN ) 100000 UNIT/ML suspension, Take 5 mLs (500,000 Units total) by mouth 4 (four) times daily., Disp: 60 mL, Rfl: 0   omeprazole  (PRILOSEC) 20 MG capsule, Take 1 capsule (20 mg total) by mouth 2 (two) times daily., Disp: 60 capsule, Rfl: 0   predniSONE  (DELTASONE ) 20 MG tablet, Take 2 tablets (40 mg total) by mouth daily with breakfast., Disp: 10 tablet, Rfl: 0   sertraline  (ZOLOFT ) 50 MG tablet, Take 1 tablet (50 mg total) by mouth daily. (Patient not taking: Reported on 05/23/2021), Disp: 30 tablet, Rfl: 2   sodium chloride  flush (NS) 0.9 % SOLN, 10 mLs by Intracatheter route every 12 (twelve) hours., Disp: 15 Syringe, Rfl: 0   Medications ordered in this encounter:  Meds ordered this encounter  Medications   brompheniramine-pseudoephedrine-DM 30-2-10 MG/5ML syrup    Sig: Take 5 mLs by mouth 4 (four) times daily as needed for up to 5 days.    Dispense:  100 mL    Refill:  0    Supervising Provider:  BLAISE ALEENE KIDD [[id number]]     *If you need refills on other medications prior to your next appointment, please contact your pharmacy*  Follow-Up: Call back or seek an in-person evaluation if the symptoms worsen or if the condition fails to improve as anticipated.  [facility] [phone]  Other Instructions  Get rest and adequate sleep  Drink plenty of water , broth, and other clear fluids to stay hydrated.  Use a cool-mist humidifier or take steamy showers to relieve congestion.  Elevate the head of the bed to help with post nasal drainage Sip warm liquids, gargle with salt water , use  lozenges, or suck on hard candy.  Use over-the-counter medications like acetaminophen  (Tylenol ) or ibuprofen  (Advil , Motrin ) as needed for fever and pain Honey cough drops can help alleviate cough symptoms.  Use saline nasal sprays or washes.  Over the counter mucinex , max strength ( blue and white box) to help loosen sinus congestion.  Please to the emergency room if any new or worsening symptoms  Please seek an in-person evaluation if the symptoms worsen or if the condition fails to improve as anticipated.  PCP follow-up in 5-7 days   If you have been instructed to have an in-person evaluation today at a local Urgent Care facility, please use the link below. It will take you to a list of all of our available [facility], including address, phone number and hours of operation. Please do not delay care.  [facility]  If you or a family member do not have a primary care provider, use the link below to schedule a visit and establish care. When you choose a [facility] primary care physician or advanced practice provider, you gain a long-term partner in health. Find a Primary Care Provider  Learn more about [facility]'s in-office and virtual care options: [facility] - Get Care Now

## 2024-06-30 NOTE — Progress Notes (Signed)

## 2024-06-30 NOTE — Patient Instructions (Signed)

## 2024-06-30 NOTE — Progress Notes (Signed)
 Virtual Visit Consent   [name], you are scheduled for a virtual visit with a [facility] provider today. Just as with appointments in the office, your consent must be obtained to participate. Your consent will be active for this visit and any virtual visit you may have with one of our providers in the next 365 days. If you have a MyChart account, a copy of this consent can be sent to you electronically.  As this is a virtual visit, video technology does not allow for your provider to perform a traditional examination. This may limit your provider's ability to fully assess your condition. If your provider identifies any concerns that need to be evaluated in person or the need to arrange testing (such as labs, EKG, etc.), we will make arrangements to do so. Although advances in technology are sophisticated, we cannot ensure that it will always work on either your end or our end. If the connection with a video visit is poor, the visit may have to be switched to a telephone visit. With either a video or telephone visit, we are not always able to ensure that we have a secure connection.  By engaging in this virtual visit, you consent to the provision of healthcare and authorize for your insurance to be billed (if applicable) for the services provided during this visit. Depending on your insurance coverage, you may receive a charge related to this service.  I need to obtain your verbal consent now. Are you willing to proceed with your visit today? Cathy Hughes has provided verbal consent on 06/30/2024 for a virtual visit (video or telephone). Cathy Upshaw, PA-C  Date: 06/30/2024 2:01 PM   Virtual Visit via Video Note   I, Cathy Hughes, connected with  Cathy Hughes  ([id number], Jan 12, 1961) on 06/30/24 at  1:45 PM EDT by a video-enabled telemedicine application and verified that I am speaking with the correct person using two identifiers.  Location: Patient: Virtual Visit Location  Patient: Home Provider: Virtual Visit Location Provider: Home Office   I discussed the limitations of evaluation and management by telemedicine and the availability of in person appointments. The patient expressed understanding and agreed to proceed.    History of Present Illness: Cathy Hughes is a 63 y.o. who identifies as a female who was assigned female at birth, and is being seen today for head cold.  HPI: Patient reports not feeling well over the last few days.  She states that since Friday she has had head cold symptoms which include nasal congestion runny nose and overall not feeling well.  She denies any ear pain, cough, chest pain, shortness of breath.  She has not taken any thing over-the-counter for her symptoms.  Patient states that she was not able to go to work due to her symptoms.  Patient reports no concern for COVID or flu, reports no sick contacts.  Reports a history of similar symptoms in the past, treated with nasal spray which she still has and congestion medication.     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   Perennial allergic rhinitis 10/06/2019   Tobacco abuse 10/06/2019   Sepsis (HCC) 10/05/2019   Acute metabolic encephalopathy 10/05/2019   Hyperglycemia due to type 2 diabetes mellitus (HCC) 10/05/2019   Left ear pain 10/05/2019   COPD (chronic  obstructive pulmonary disease) (HCC) 07/31/2018   Class 1 obesity due to excess calories with serious comorbidity and body mass index (BMI) of 34.0 to 34.9 in adult 07/31/2018   HTN (hypertension) 12/09/2013    Allergies:  Allergies  Allergen Reactions   Cortisone Shortness Of Breath and Swelling   Aspirin Hives   Tramadol  Other (See Comments)    Headache    Medications:  Current Outpatient Medications:    brompheniramine-pseudoephedrine-DM 30-2-10  MG/5ML syrup, Take 5 mLs by mouth 4 (four) times daily as needed for up to 5 days., Disp: 100 mL, Rfl: 0   albuterol  (VENTOLIN  HFA) 108 (90 Base) MCG/ACT inhaler, Inhale 2-4 puffs by mouth every 4 hours as needed for wheezing, cough, and/or shortness of breath, Disp: 18 g, Rfl: 0   ALPRAZolam  (XANAX ) 0.5 MG tablet, Take 0.5 mg by mouth 2 (two) times daily as needed., Disp: , Rfl:    amitriptyline (ELAVIL) 10 MG tablet, Take 10 mg by mouth at bedtime., Disp: , Rfl:    amLODipine  (NORVASC ) 10 MG tablet, Take 0.5 tablets (5 mg total) by mouth daily., Disp: 30 tablet, Rfl: 0   Ensure Max Protein (ENSURE MAX PROTEIN) LIQD, Take 330 mLs (11 oz total) by mouth 2 (two) times daily between meals., Disp: 660 mL, Rfl: 0   fluticasone  (FLONASE ) 50 MCG/ACT nasal spray, Place 2 sprays into both nostrils daily., Disp: 16 g, Rfl: 0   ipratropium (ATROVENT ) 0.03 % nasal spray, Place 2 sprays into both nostrils every 12 (twelve) hours., Disp: 30 mL, Rfl: 0   ipratropium-albuterol  (DUONEB) 0.5-2.5 (3) MG/3ML SOLN, Take 3 mLs by nebulization every 6 (six) hours as needed. Dx: J44.1, Disp: 180 mL, Rfl: 0   lidocaine  (XYLOCAINE ) 2 % solution, Use as directed 5 mLs in the mouth or throat every 6 (six) hours as needed for mouth pain. Mix with 5 mL of Bromfed-DM for swish and swallow., Disp: 100 mL, Rfl: 0   meclizine  (ANTIVERT ) 12.5 MG tablet, Take 1 tablet (12.5 mg total) by mouth 3 (three) times daily as needed for dizziness., Disp: 20 tablet, Rfl: 0   montelukast  (SINGULAIR ) 10 MG tablet, Take 1 tablet (10 mg total) by mouth daily., Disp: 30 tablet, Rfl: 0   nystatin  (MYCOSTATIN ) 100000 UNIT/ML suspension, Take 5 mLs (500,000 Units total) by mouth 4 (four) times daily., Disp: 60 mL, Rfl: 0   omeprazole  (PRILOSEC) 20 MG capsule, Take 1 capsule (20 mg total) by mouth 2 (two) times daily., Disp: 60 capsule, Rfl: 0   predniSONE  (DELTASONE ) 20 MG tablet, Take 2 tablets (40 mg total) by mouth daily with breakfast., Disp: 10  tablet, Rfl: 0   sertraline  (ZOLOFT ) 50 MG tablet, Take 1 tablet (50 mg total) by mouth daily. (Patient not taking: Reported on 05/23/2021), Disp: 30 tablet, Rfl: 2   sodium chloride  flush (NS) 0.9 % SOLN, 10 mLs by Intracatheter route every 12 (twelve) hours., Disp: 15 Syringe, Rfl: 0  Observations/Objective: Patient is well-developed, well-nourished in no acute distress.  Resting comfortably at home.  Head is normocephalic, atraumatic.  No labored breathing.  Speech is clear and coherent with logical content.  Patient is alert and oriented at baseline.    Assessment and Plan: 1. Acute URI (Primary) - brompheniramine-pseudoephedrine-DM 30-2-10 MG/5ML syrup; Take 5 mLs by mouth 4 (four) times daily as needed for up to 5 days.  Dispense: 100 mL; Refill: 0    Follow Up Instructions: I discussed the assessment and treatment plan with the patient. The patient  was provided an opportunity to ask questions and all were answered. The patient agreed with the plan and demonstrated an understanding of the instructions.  A copy of instructions were sent to the patient via MyChart unless otherwise noted below.    The patient was advised to call back or seek an in-person evaluation if the symptoms worsen or if the condition fails to improve as anticipated.    Jamarcus Laduke, PA-C

## 2024-06-30 NOTE — Progress Notes (Signed)
 Virtual Visit Consent   KEELEE Hughes, you are scheduled for a virtual visit with a [facility] provider today. Just as with appointments in the office, your consent must be obtained to participate. Your consent will be active for this visit and any virtual visit you may have with one of our providers in the next 365 days. If you have a MyChart account, a copy of this consent can be sent to you electronically.  As this is a virtual visit, video technology does not allow for your provider to perform a traditional examination. This may limit your provider's ability to fully assess your condition. If your provider identifies any concerns that need to be evaluated in person or the need to arrange testing (such as labs, EKG, etc.), we will make arrangements to do so. Although advances in technology are sophisticated, we cannot ensure that it will always work on either your end or our end. If the connection with a video visit is poor, the visit may have to be switched to a telephone visit. With either a video or telephone visit, we are not always able to ensure that we have a secure connection.  By engaging in this virtual visit, you consent to the provision of healthcare and authorize for your insurance to be billed (if applicable) for the services provided during this visit. Depending on your insurance coverage, you may receive a charge related to this service.  I need to obtain your verbal consent now. Are you willing to proceed with your visit today? Cathy Hughes has provided verbal consent on 06/30/2024 for a virtual visit (video or telephone). Loa Lamp, FNP  Date: 06/30/2024 6:44 PM   Virtual Visit via Video Note   I, Loa Lamp, connected with  Cathy Hughes  ([id number], 20-Sep-1960) on 06/30/24 at  6:15 PM EDT by a video-enabled telemedicine application and verified that I am speaking with the correct person using two identifiers.  Location: Patient: Virtual Visit Location Patient:  Home Provider: Virtual Visit Location Provider: Home Office   I discussed the limitations of evaluation and management by telemedicine and the availability of in person appointments. The patient expressed understanding and agreed to proceed.    History of Present Illness: Cathy Hughes is a 63 y.o. who identifies as a female who was assigned female at birth, and is being seen today for cough, chest and head congestion, reports flonase , atrovent  nasal spray are not helping chest congestion. She says Bromfed DM has not helped chest congestion in the past. It was a pill. She is not in distress and has had prednisone  multiple times in the past month.   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   Perennial allergic rhinitis 10/06/2019   Tobacco abuse 10/06/2019   Sepsis (HCC) 10/05/2019   Acute metabolic encephalopathy 10/05/2019   Hyperglycemia due to type 2 diabetes mellitus (HCC) 10/05/2019   Left ear pain 10/05/2019   COPD (chronic obstructive pulmonary disease) (HCC) 07/31/2018   Class 1 obesity due to excess calories with serious comorbidity and body mass index (BMI) of 34.0 to 34.9 in adult 07/31/2018   HTN (hypertension) 12/09/2013    Allergies:  Allergies  Allergen Reactions   Cortisone Shortness Of Breath and Swelling   Aspirin Hives   Tramadol  Other (See Comments)  Headache    Medications:  Current Outpatient Medications:    guaiFENesin  200 MG tablet, Take 1 tablet (200 mg total) by mouth every 6 (six) hours as needed for up to 10 days for cough or to loosen phlegm., Disp: 30 tablet, Rfl: 0   albuterol  (VENTOLIN  HFA) 108 (90 Base) MCG/ACT inhaler, Inhale 2-4 puffs by mouth every 4 hours as needed for wheezing, cough, and/or shortness of breath, Disp: 18 g, Rfl: 0   ALPRAZolam  (XANAX ) 0.5 MG tablet, Take  0.5 mg by mouth 2 (two) times daily as needed., Disp: , Rfl:    amitriptyline (ELAVIL) 10 MG tablet, Take 10 mg by mouth at bedtime., Disp: , Rfl:    amLODipine  (NORVASC ) 10 MG tablet, Take 0.5 tablets (5 mg total) by mouth daily., Disp: 30 tablet, Rfl: 0   brompheniramine-pseudoephedrine-DM 30-2-10 MG/5ML syrup, Take 5 mLs by mouth 4 (four) times daily as needed for up to 5 days., Disp: 100 mL, Rfl: 0   Ensure Max Protein (ENSURE MAX PROTEIN) LIQD, Take 330 mLs (11 oz total) by mouth 2 (two) times daily between meals., Disp: 660 mL, Rfl: 0   fluticasone  (FLONASE ) 50 MCG/ACT nasal spray, Place 2 sprays into both nostrils daily., Disp: 16 g, Rfl: 0   ipratropium (ATROVENT ) 0.03 % nasal spray, Place 2 sprays into both nostrils every 12 (twelve) hours., Disp: 30 mL, Rfl: 0   ipratropium-albuterol  (DUONEB) 0.5-2.5 (3) MG/3ML SOLN, Take 3 mLs by nebulization every 6 (six) hours as needed. Dx: J44.1, Disp: 180 mL, Rfl: 0   lidocaine  (XYLOCAINE ) 2 % solution, Use as directed 5 mLs in the mouth or throat every 6 (six) hours as needed for mouth pain. Mix with 5 mL of Bromfed-DM for swish and swallow., Disp: 100 mL, Rfl: 0   meclizine  (ANTIVERT ) 12.5 MG tablet, Take 1 tablet (12.5 mg total) by mouth 3 (three) times daily as needed for dizziness., Disp: 20 tablet, Rfl: 0   montelukast  (SINGULAIR ) 10 MG tablet, Take 1 tablet (10 mg total) by mouth daily., Disp: 30 tablet, Rfl: 0   nystatin  (MYCOSTATIN ) 100000 UNIT/ML suspension, Take 5 mLs (500,000 Units total) by mouth 4 (four) times daily., Disp: 60 mL, Rfl: 0   omeprazole  (PRILOSEC) 20 MG capsule, Take 1 capsule (20 mg total) by mouth 2 (two) times daily., Disp: 60 capsule, Rfl: 0   sertraline  (ZOLOFT ) 50 MG tablet, Take 1 tablet (50 mg total) by mouth daily. (Patient not taking: Reported on 05/23/2021), Disp: 30 tablet, Rfl: 2   sodium chloride  flush (NS) 0.9 % SOLN, 10 mLs by Intracatheter route every 12 (twelve) hours., Disp: 15 Syringe, Rfl:  0  Observations/Objective: Patient is well-developed, well-nourished in no acute distress.  Resting comfortably  at home.  Head is normocephalic, atraumatic.  No labored breathing.  Speech is clear and coherent with logical content.  Patient is alert and oriented at baseline.    Assessment and Plan: 1. Viral URI with cough (Primary)  Urgent care if sx persist or worsen, increase fluids humidifier at night.  Follow Up Instructions: I discussed the assessment and treatment plan with the patient. The patient was provided an opportunity to ask questions and all were answered. The patient agreed with the plan and demonstrated an understanding of the instructions.  A copy of instructions were sent to the patient via MyChart unless otherwise noted below.     The patient was advised to call back or seek an in-person evaluation if the symptoms worsen or if the condition  fails to improve as anticipated.    Shaina Gullatt, FNP

## 2024-09-30 ENCOUNTER — Telehealth: Payer: Self-pay

## 2024-09-30 NOTE — Telephone Encounter (Signed)
 Copied from CRM #[id number]. Topic: Clinical - Medication Question >> Sep 30, 2024 12:48 PM Emylou G wrote: Reason for CRM: Monique Ward w/[employer] her ins called.. wanted to relay info for patients appt with us :  Can she get a EGFR and UACR tests?  A mammogram & colonoscopy & annual wellness? Patient is suppose to let us  know when she comes as well. [phone] secured line

## 2024-10-15 ENCOUNTER — Ambulatory Visit: Admitting: Nurse Practitioner

## 2024-12-03 ENCOUNTER — Ambulatory Visit: Admitting: Nurse Practitioner
